# Patient Record
Sex: Male | Born: 1940
Health system: Southern US, Community
[De-identification: ages and names within clinical notes are randomized; demographics above are authoritative.]

## PROBLEM LIST (undated history)

## (undated) DIAGNOSIS — Z8719 Personal history of other diseases of the digestive system: Secondary | ICD-10-CM

## (undated) DIAGNOSIS — I2510 Atherosclerotic heart disease of native coronary artery without angina pectoris: Secondary | ICD-10-CM

## (undated) DIAGNOSIS — E7800 Pure hypercholesterolemia, unspecified: Secondary | ICD-10-CM

## (undated) DIAGNOSIS — K219 Gastro-esophageal reflux disease without esophagitis: Secondary | ICD-10-CM

## (undated) DIAGNOSIS — I10 Essential (primary) hypertension: Secondary | ICD-10-CM

## (undated) DIAGNOSIS — T7840XA Allergy, unspecified, initial encounter: Secondary | ICD-10-CM

## (undated) DIAGNOSIS — J449 Chronic obstructive pulmonary disease, unspecified: Secondary | ICD-10-CM

## (undated) DIAGNOSIS — M1990 Unspecified osteoarthritis, unspecified site: Secondary | ICD-10-CM

## (undated) HISTORY — PX: KNEE ARTHROSCOPY: SHX127

## (undated) HISTORY — PX: EYE SURGERY: SHX253

## (undated) HISTORY — DX: Allergy, unspecified, initial encounter: T78.40XA

## (undated) HISTORY — PX: OTHER SURGICAL HISTORY: SHX169

## (undated) HISTORY — PX: CARDIAC CATHETERIZATION: SHX172

## (undated) HISTORY — PX: CORONARY ANGIOPLASTY: SHX604

## (undated) HISTORY — DX: Chronic obstructive pulmonary disease, unspecified: J44.9

---

## 2004-08-08 ENCOUNTER — Emergency Department (HOSPITAL_COMMUNITY): Admission: EM | Admit: 2004-08-08 | Discharge: 2004-08-08 | Payer: Self-pay | Admitting: *Deleted

## 2007-06-25 ENCOUNTER — Ambulatory Visit: Payer: Self-pay | Admitting: Unknown Physician Specialty

## 2012-11-05 ENCOUNTER — Ambulatory Visit: Payer: Self-pay | Admitting: Cardiology

## 2012-11-06 LAB — BASIC METABOLIC PANEL
Anion Gap: 7 (ref 7–16)
BUN: 12 mg/dL (ref 7–18)
Calcium, Total: 8.3 mg/dL — ABNORMAL LOW (ref 8.5–10.1)
Co2: 26 mmol/L (ref 21–32)
Creatinine: 0.96 mg/dL (ref 0.60–1.30)
EGFR (African American): >60
EGFR (Non-African Amer.): >60
Glucose: 127 mg/dL — ABNORMAL HIGH (ref 65–99)
Potassium: 3.8 mmol/L (ref 3.5–5.1)
Sodium: 137 mmol/L (ref 136–145)

## 2012-11-06 LAB — CK TOTAL AND CKMB (NOT AT ARMC): CK-MB: 2.3 ng/mL (ref 0.5–3.6)

## 2013-10-21 DIAGNOSIS — E785 Hyperlipidemia, unspecified: Secondary | ICD-10-CM | POA: Insufficient documentation

## 2013-10-21 DIAGNOSIS — I10 Essential (primary) hypertension: Secondary | ICD-10-CM | POA: Insufficient documentation

## 2014-01-07 DIAGNOSIS — I2510 Atherosclerotic heart disease of native coronary artery without angina pectoris: Secondary | ICD-10-CM | POA: Insufficient documentation

## 2014-01-07 DIAGNOSIS — I25119 Atherosclerotic heart disease of native coronary artery with unspecified angina pectoris: Secondary | ICD-10-CM | POA: Insufficient documentation

## 2014-03-05 ENCOUNTER — Ambulatory Visit: Payer: Self-pay | Admitting: Unknown Physician Specialty

## 2014-03-08 LAB — PATHOLOGY REPORT

## 2015-01-27 ENCOUNTER — Ambulatory Visit (INDEPENDENT_AMBULATORY_CARE_PROVIDER_SITE_OTHER): Payer: PPO | Admitting: Family Medicine

## 2015-01-27 ENCOUNTER — Ambulatory Visit (INDEPENDENT_AMBULATORY_CARE_PROVIDER_SITE_OTHER)
Admission: RE | Admit: 2015-01-27 | Discharge: 2015-01-27 | Disposition: A | Discharge status: Home / Self Care | Payer: PPO | Source: Ambulatory Visit | Attending: Family Medicine | Admitting: Family Medicine

## 2015-01-27 ENCOUNTER — Encounter: Payer: Self-pay | Admitting: Family Medicine

## 2015-01-27 ENCOUNTER — Encounter: Payer: Self-pay | Admitting: *Deleted

## 2015-01-27 VITALS — BP 118/60 | HR 68 | Wt 165.0 lb

## 2015-01-27 DIAGNOSIS — M545 Low back pain, unspecified: Secondary | ICD-10-CM

## 2015-01-27 DIAGNOSIS — I2510 Atherosclerotic heart disease of native coronary artery without angina pectoris: Secondary | ICD-10-CM | POA: Insufficient documentation

## 2015-01-27 DIAGNOSIS — M5416 Radiculopathy, lumbar region: Secondary | ICD-10-CM | POA: Diagnosis not present

## 2015-01-27 DIAGNOSIS — K219 Gastro-esophageal reflux disease without esophagitis: Secondary | ICD-10-CM | POA: Insufficient documentation

## 2015-01-27 DIAGNOSIS — D126 Benign neoplasm of colon, unspecified: Secondary | ICD-10-CM | POA: Insufficient documentation

## 2015-01-27 DIAGNOSIS — T7840XA Allergy, unspecified, initial encounter: Secondary | ICD-10-CM | POA: Insufficient documentation

## 2015-01-27 MED ORDER — GABAPENTIN 100 MG PO CAPS: 100.0000 mg | ORAL_CAPSULE | Freq: Every day | ORAL | Status: DC

## 2015-01-27 NOTE — Assessment & Plan Note (Signed)
Patient is having signs and symptoms of lumbar radiculopathy. No significant weakness or numbness noted that seems to be consistent. Patient is actually improving over the course of time.  Discussed with patient and patient will do a short course of anti-inflammatory for the next 6 days, on gabapentin at night, home exercises and patient work with Product/process development scientist today to [place] greater detail. We discussed icing protocol and which activities to potentially avoid. Patient come back in 2-3 weeks or call sooner if any severe symptoms occur which we did go over with patient.

## 2015-01-27 NOTE — Progress Notes (Signed)
[name] [place], [place] [address] Phone: [phone] Subjective:    I'm seeing this patient by the request  of:  Baboff MD  CC: Back pain [id number] James Peck is a 74 y.o. male coming in with complaint of back pain for approximately 3 weeks. Patient does not remember any specific injury. States that he felt a twinge when he was in a shower. Next day he was unable to even moves significant. Patient actually had pain going down both legs but is slowly started to decrease but still seems to be localized over the left leg. States that is mostly on the posterior aspect the leg and has gone away down to his foot. Seems to be loosening up somewhat. States though in the morning he can be very painful. Worse with flexing at the hip. Denies any weakness, numbness that is consistent, or any bowel or bladder changes. Patient also denies any fevers or chills or any abnormal weight loss. States that the pain was severe enough to be 9 out of 10 but now is 3 out of 10 in severity. Patient states that he did try Advil with mild improvement.   Patient Active Problem List   Diagnosis Date Noted  . Acid reflux 01/27/2015  . Adenomatous colon polyp 01/27/2015  . Allergic state 01/27/2015  . Arteriosclerosis of coronary artery 01/27/2015  . CAD in native artery 01/07/2014  . Benign essential HTN 10/21/2013  . HLD (hyperlipidemia) 10/21/2013   History  Substance Use Topics  . Smoking status: Not on file  . Smokeless tobacco: Not on file  . Alcohol Use: Not on file     Cardiac stent    Colonoscopy 06/25/2007 Adenomatous Polyp  Colonoscopy 03/05/2014 Adenomatous Polyps: CBF 02/2017  Coronary Angioplasty  s/p pci of rca in 1999; pci of lad in 2014   Family History Medical History Relation Name Comments  Heart disease Mother    Diabetes type II Father    Heart disease         Social History Tobacco Use Types Packs/Day Years Used Date    Former Smoker    Quit: 07/30/1992   Alcohol Use Drinks/Week oz/Week Comments  no   Past medical history, social, surgical and family history all reviewed in electronic medical record.   Review of Systems: No headache, visual changes, nausea, vomiting, diarrhea, constipation, dizziness, abdominal pain, skin rash, fevers, chills, night sweats, weight loss, swollen lymph nodes, body aches, joint swelling, muscle aches, chest pain, shortness of breath, mood changes.   Objective Blood pressure 118/60, pulse 68, weight 165 lb (74.844 kg), SpO2 96 %.  General: No apparent distress alert and oriented x3 mood and affect normal, dressed appropriately.  HEENT: Pupils equal, extraocular movements intact  Respiratory: Patient's speak in full sentences and does not appear short of breath  Cardiovascular: No lower extremity edema, non tender, no erythema  Skin: Warm dry intact with no signs of infection or rash on extremities or on axial skeleton.  Abdomen: Soft nontender  Neuro: Cranial nerves II through XII are intact, neurovascularly intact in all extremities with 2+ DTRs and 2+ pulses.  Lymph: No lymphadenopathy of posterior or anterior cervical chain or axillae bilaterally.  Gait normal with good balance and coordination.  MSK:  Non tender with full range of motion and good stability and symmetric strength and tone of shoulders, elbows, wrist, hip, knee and ankles bilaterally. Moderate osteophytic changes of multiple joints Back Exam:  Inspection: Unremarkable  Motion: Flexion 25 deg with mild radicular symptoms down the left leg, Extension 25 deg, Side Bending to 35 deg bilaterally,  Rotation to 30 deg bilaterally  SLR laying: Positive left side XSLR laying: Negative  Palpable tenderness: Tender to palpation over the sacroiliac joint as well as the paraspinal musculature of L5-S1 on the left side FABER: Positive left side Sensory change: Gross sensation intact to all lumbar and sacral  dermatomes.  Reflexes: 2+ at both patellar tendons, 2+ at achilles tendons, Babinski's downgoing.  Strength at foot  Plantar-flexion: 5/5 Dorsi-flexion: 5/5 Eversion: 5/5 Inversion: 5/5  Leg strength  Quad: 5/5 Hamstring: 5/5 Hip flexor: 5/5 Hip abductors: 4/5 but symmetric Gait unremarkable.     Impression and Recommendations:     This case required medical decision making of moderate complexity.

## 2015-01-27 NOTE — Progress Notes (Signed)
Pre visit review using our clinic review tool, if applicable. No additional management support is needed unless otherwise documented below in the visit note. 

## 2015-01-27 NOTE — Patient Instructions (Signed)
Good to see you.  Ice 20 minutes 2 times daily. Usually after activity and before bed. Exercises 3 times a week.  Get xray downstairs today on your back Try the Duexis for 3-6days (1pill 3x/day)  Use the Gabapentin nightly 100mg  to help with the nerve pain Change sitting position ain the car with a towel under knee.  See me again in 2-3 weeks.

## 2015-02-14 ENCOUNTER — Ambulatory Visit (INDEPENDENT_AMBULATORY_CARE_PROVIDER_SITE_OTHER): Payer: PPO | Admitting: Family Medicine

## 2015-02-14 ENCOUNTER — Other Ambulatory Visit (INDEPENDENT_AMBULATORY_CARE_PROVIDER_SITE_OTHER): Payer: PPO

## 2015-02-14 ENCOUNTER — Encounter: Payer: Self-pay | Admitting: Family Medicine

## 2015-02-14 VITALS — BP 118/78 | HR 72 | Wt 163.0 lb

## 2015-02-14 DIAGNOSIS — M25561 Pain in right knee: Secondary | ICD-10-CM | POA: Diagnosis not present

## 2015-02-14 DIAGNOSIS — M23207 Derangement of unspecified meniscus due to old tear or injury, left knee: Secondary | ICD-10-CM

## 2015-02-14 DIAGNOSIS — M5416 Radiculopathy, lumbar region: Secondary | ICD-10-CM

## 2015-02-14 DIAGNOSIS — M23307 Other meniscus derangements, unspecified meniscus, left knee: Secondary | ICD-10-CM | POA: Insufficient documentation

## 2015-02-14 NOTE — Assessment & Plan Note (Signed)
Significantly improve with conservative therapy. Patient encouraged to continue the over-the-counter natural supplements and the home exercises. Patient work on more core strengthening as well as well. Patient will come back and see me again if any worsening symptoms. I would like to follow-up though one more time in 4-6 weeks to make sure patient is nearly pain-free.

## 2015-02-14 NOTE — Patient Instructions (Signed)
Good to see you.  Ice 20 minutes 2 times daily. Usually after activity and before bed. Exercises 3 times a week.  Degenerative tear of the meniscus.  Turmeric 500mg  twice daily Wear the knee brace with a lot of activity.  See me again in 3-4 weeks for knee and injection if not a lot better.

## 2015-02-14 NOTE — Progress Notes (Signed)
[facility] [place], [place] [address] Phone: [phone] Subjective:    CC: Back pain  James Peck is a 75 y.o. male coming in with complaint of back pain for approximately 3 weeks. She was found to have some mild exacerbation of back pain. This seemed to be more musculoskeletal in nature. Patient did have x-rays after last exam and showed some mild arthritis. Patient's was to do home exercises, icing protocol, and was started on gabapentin at night for the radicular symptoms. Patient states his back pain is 75% better. Patient states that it is very mildly dull ache. Patient states no significant radicular symptoms. Patient is feeling better with the exercises, icing and the natural supplementations.  Patient has a new problem no. Patient is having more of a left knee pain. States that it seems to be on the anterior medial aspect of the knee. Starts to give him a catching sensation that can be severely painful. Patient states certain range of motion can hurt. Denies any radiation down the legs any numbness or any significant instability. States that the soreness at the end of a long day has been difficult to follow sleep from.   Patient Active Problem List   Diagnosis Date Noted  . Acid reflux 01/27/2015  . Adenomatous colon polyp 01/27/2015  . Allergic state 01/27/2015  . Arteriosclerosis of coronary artery 01/27/2015  . Lumbar radiculopathy 01/27/2015  . CAD in native artery 01/07/2014  . Benign essential HTN 10/21/2013  . HLD (hyperlipidemia) 10/21/2013   History  Substance Use Topics  . Smoking status: Former Research scientist (life sciences)  . Smokeless tobacco: Never Used  . Alcohol Use: No     Cardiac stent    Colonoscopy 06/25/2007 Adenomatous Polyp  Colonoscopy 03/05/2014 Adenomatous Polyps: CBF 02/2017  Coronary Angioplasty  s/p pci of rca in 1999; pci of lad in 2014   Family History Medical History Relation Name Comments  Heart  disease Mother    Diabetes type II Father    Heart disease         Social History Tobacco Use Types Packs/Day Years Used Date  Former Smoker    Quit: 07/30/1992   Alcohol Use Drinks/Week oz/Week Comments  no   Past medical history, social, surgical and family history all reviewed in electronic medical record.   Review of Systems: No headache, visual changes, nausea, vomiting, diarrhea, constipation, dizziness, abdominal pain, skin rash, fevers, chills, night sweats, weight loss, swollen lymph nodes, body aches, joint swelling, muscle aches, chest pain, shortness of breath, mood changes.   Objective Blood pressure 118/78, pulse 72, weight 163 lb (73.936 kg), SpO2 98 %.  General: No apparent distress alert and oriented x3 mood and affect normal, dressed appropriately.  HEENT: Pupils equal, extraocular movements intact  Respiratory: Patient's speak in full sentences and does not appear short of breath  Cardiovascular: No lower extremity edema, non tender, no erythema  Skin: Warm dry intact with no signs of infection or rash on extremities or on axial skeleton.  Abdomen: Soft nontender  Neuro: Cranial nerves II through XII are intact, neurovascularly intact in all extremities with 2+ DTRs and 2+ pulses.  Lymph: No lymphadenopathy of posterior or anterior cervical chain or axillae bilaterally.  Gait normal with good balance and coordination.  MSK:  Non tender with full range of motion and good stability and symmetric strength and tone of shoulders, elbows, wrist, hip, knee and ankles bilaterally. Moderate osteophytic changes of multiple joints Back  Exam:  Inspection: Unremarkable  Motion: Flexion 35 deg improved Extension 25 deg, Side Bending to 35 deg bilaterally,  Rotation to 30 deg bilaterally  SLR laying: Negative today which is an improvement XSLR laying: Negative  Palpable tenderness: Mild tenderness over the sacroiliac joint FABER: Negative Sensory change: Gross  sensation intact to all lumbar and sacral dermatomes.  Reflexes: 2+ at both patellar tendons, 2+ at achilles tendons, Babinski's downgoing.  Strength at foot  Plantar-flexion: 5/5 Dorsi-flexion: 5/5 Eversion: 5/5 Inversion: 5/5  Leg strength  Quad: 5/5 Hamstring: 5/5 Hip flexor: 5/5 Hip abductors: 4/5 but symmetric Gait unremarkable.  Knee: Left Normal to inspection with no erythema or effusion or obvious bony abnormalities. Mild tenderness over the medial joint line ROM full in flexion and extension and lower leg rotation. Ligaments with solid consistent endpoints including ACL, PCL, LCL, MCL. Positive Mcmurray's, Apley's, and Thessalonian tests. Non painful patellar compression. Patellar glide without crepitus. Patellar and quadriceps tendons unremarkable. Hamstring and quadriceps strength is normal.  Contralateral knee unremarkable  MSK US performed of: Left knee This study was ordered, performed, and interpreted by Charlann Boxer D.O.  Knee: All structures visualized. Mild narrowing of the medial joint line with a degenerative meniscal tear with mild displacement. Patellar Tendon unremarkable on long and transverse views without effusion. No abnormality of prepatellar bursa. LCL and MCL unremarkable on long and transverse views. No abnormality of origin of medial or lateral head of the gastrocnemius.  IMPRESSION:  Mild arthritis an medial meniscal tear degenerative     Impression and Recommendations:     This case required medical decision making of moderate complexity.

## 2015-02-14 NOTE — Assessment & Plan Note (Signed)
She does have a tear involving try topical anti-inflammatory's, we discussed icing regimen. Patient declined injection today. Patient will try to make these changes and come back and see me again in 3-4 weeks if having worsening symptoms we'll consider injection and physical therapy.

## 2015-03-09 ENCOUNTER — Encounter: Payer: Self-pay | Admitting: Family Medicine

## 2015-03-09 ENCOUNTER — Ambulatory Visit (INDEPENDENT_AMBULATORY_CARE_PROVIDER_SITE_OTHER): Payer: PPO | Admitting: Family Medicine

## 2015-03-09 VITALS — BP 120/70 | HR 81 | Wt 165.0 lb

## 2015-03-09 DIAGNOSIS — M23307 Other meniscus derangements, unspecified meniscus, left knee: Secondary | ICD-10-CM

## 2015-03-09 DIAGNOSIS — M25562 Pain in left knee: Secondary | ICD-10-CM

## 2015-03-09 DIAGNOSIS — M23207 Derangement of unspecified meniscus due to old tear or injury, left knee: Secondary | ICD-10-CM

## 2015-03-09 NOTE — Progress Notes (Signed)
[name] [place], [place] [address] Phone: [phone] Subjective:    CC: Back pain knee pain follow-up James Peck is a 74 y.o. male coming in with complaint of back pain.  Found to have mild to moderate osteophytic changes of the back. Patient has done very well with conservative therapy. Patient states that the back pain is doing very well.  Patient is also having knee pain. Patient was found to have degenerative meniscal tear of the left knee. Patient elected try conservative therapy including home exercises icing protocol topical medications. Patient states continues to have difficulty. Patient states that unfortunately if he is now wearing the brace he has even difficult he even walking. Patient states that it is sore at night. Describes it as a throbbing pain. Denies any radiation. Denies any giving out on him.   Patient Active Problem List   Diagnosis Date Noted  . 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 Acid reflux 01/27/2015  . Adenomatous colon polyp 01/27/2015  . Allergic state 01/27/2015  . Arteriosclerosis of coronary artery 01/27/2015  . Lumbar radiculopathy 01/27/2015  . CAD in native artery 01/07/2014  . Benign essential HTN 10/21/2013  . HLD (hyperlipidemia) 10/21/2013   Social History  Substance Use Topics  . Smoking status: Former Research scientist (life sciences)  . Smokeless tobacco: Never Used  . Alcohol Use: No     Cardiac stent    Colonoscopy 06/25/2007 Adenomatous Polyp  Colonoscopy 03/05/2014 Adenomatous Polyps: CBF 02/2017  Coronary Angioplasty  s/p pci of rca in 1999; pci of lad in 2014   Family History Medical History Relation Name Comments  Heart disease Mother    Diabetes type II Father    Heart disease         Social History Tobacco Use Types Packs/Day Years Used Date  Former Smoker    Quit: 07/30/1992   Alcohol Use Drinks/Week oz/Week Comments  no   Past medical history,  social, surgical and family history all reviewed in electronic medical record.   Review of Systems: No headache, visual changes, nausea, vomiting, diarrhea, constipation, dizziness, abdominal pain, skin rash, fevers, chills, night sweats, weight loss, swollen lymph nodes, body aches, joint swelling, muscle aches, chest pain, shortness of breath, mood changes.   Objective Blood pressure 120/70, pulse 81, weight 165 lb (74.844 kg), SpO2 95 %.  General: No apparent distress alert and oriented x3 mood and affect normal, dressed appropriately.  HEENT: Pupils equal, extraocular movements intact  Respiratory: Patient's speak in full sentences and does not appear short of breath  Cardiovascular: No lower extremity edema, non tender, no erythema  Skin: Warm dry intact with no signs of infection or rash on extremities or on axial skeleton.  Abdomen: Soft nontender  Neuro: Cranial nerves II through XII are intact, neurovascularly intact in all extremities with 2+ DTRs and 2+ pulses.  Lymph: No lymphadenopathy of posterior or anterior cervical chain or axillae bilaterally.  Gait normal with good balance and coordination.  MSK:  Non tender with full range of motion and good stability and symmetric strength and tone of shoulders, elbows, wrist, hip, knee and ankles bilaterally. Moderate osteophytic changes of multiple joints  Knee: Left Normal to inspection with no erythema or effusion or obvious bony abnormalities. Moderate tenderness over the medial joint line ROM full in flexion and extension and lower leg rotation. Ligaments with solid consistent endpoints including ACL, PCL, LCL, MCL. Positive Mcmurray's, Apley's, and Thessalonian tests. Non  painful patellar compression. Patellar glide without crepitus. Patellar and quadriceps tendons unremarkable. Hamstring and quadriceps strength is normal.  Contralateral knee unremarkable No change from previous exam  Procedure After informed written and  verbal consent, patient was seated on exam table. Left knee was prepped with alcohol swab and utilizing anterolateral approach, patient's left knee space was injected with 4:1  marcaine 0.5%: Kenalog 40mg /dL. Patient tolerated the procedure well without immediate complications.     Impression and Recommendations:     This case required medical decision making of moderate complexity.

## 2015-03-09 NOTE — Assessment & Plan Note (Signed)
Patient was given an injection today. We discussed icing regimen and home exercises. Patient will continue with the brace as well. Any worsening of symptoms of any consider advanced imaging. Patient has been referred to formal physical therapy. Patient come back in 4 weeks.

## 2015-03-09 NOTE — Patient Instructions (Signed)
Good to see you Continue the brace with a lot of activity Gave injection today and ice in 6 hours Physical therapy will be calling you See me again in 3-4 weeks.

## 2015-03-14 ENCOUNTER — Ambulatory Visit: Payer: PPO | Admitting: Family Medicine

## 2015-03-16 ENCOUNTER — Telehealth: Payer: Self-pay | Admitting: Family Medicine

## 2015-03-16 NOTE — Telephone Encounter (Signed)
Referral re-faxed to [facility] on [address]

## 2015-03-16 NOTE — Telephone Encounter (Signed)
Patient wife stating [facility] told her today that they have not received the fax for referral. She is requesting that we send it again

## 2015-03-16 NOTE — Telephone Encounter (Signed)
See referral notes for further information regarding this referral.

## 2015-03-30 ENCOUNTER — Ambulatory Visit (INDEPENDENT_AMBULATORY_CARE_PROVIDER_SITE_OTHER): Payer: PPO | Admitting: Family Medicine

## 2015-03-30 ENCOUNTER — Encounter: Payer: Self-pay | Admitting: Family Medicine

## 2015-03-30 VITALS — BP 132/78 | HR 76 | Wt 163.0 lb

## 2015-03-30 DIAGNOSIS — M23307 Other meniscus derangements, unspecified meniscus, left knee: Secondary | ICD-10-CM

## 2015-03-30 DIAGNOSIS — M23207 Derangement of unspecified meniscus due to old tear or injury, left knee: Secondary | ICD-10-CM

## 2015-03-30 NOTE — Assessment & Plan Note (Signed)
Patient is doing significantly better. Encourage patient to do more of icing protocol. Patient will finish with formal physical therapy. Patient come back and see me again in 6 weeks if not perfect.

## 2015-03-30 NOTE — Progress Notes (Signed)
[name] [place], [place] [address] Phone: [phone] Subjective:    CC: Back pain knee pain follow-up [id number] James Peck is a 74 y.o. male coming in with complaint of back pain.  Found to have mild to moderate osteophytic changes of the back. Patient has done very well with conservative therapy. Patient states that the back pain is doing very well. Patient was found to have a meniscal injury. Patient states that he is doing 100% better. Not have anything is locking on him. States that he is doing very good with physical therapy. Denies any new symptoms. Patient is very happy.     Patient Active Problem List   Diagnosis Date Noted  . 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 Acid reflux 01/27/2015  . Adenomatous colon polyp 01/27/2015  . Allergic state 01/27/2015  . Arteriosclerosis of coronary artery 01/27/2015  . Lumbar radiculopathy 01/27/2015  . CAD in native artery 01/07/2014  . Benign essential HTN 10/21/2013  . HLD (hyperlipidemia) 10/21/2013   Social History  Substance Use Topics  . Smoking status: Former Research scientist (life sciences)  . Smokeless tobacco: Never Used  . Alcohol Use: No     Cardiac stent    Colonoscopy 06/25/2007 Adenomatous Polyp  Colonoscopy 03/05/2014 Adenomatous Polyps: CBF 02/2017  Coronary Angioplasty  s/p pci of rca in 1999; pci of lad in 2014   Family History Medical History Relation Name Comments  Heart disease Mother    Diabetes type II Father    Heart disease         Social History Tobacco Use Types Packs/Day Years Used Date  Former Smoker    Quit: 07/30/1992   Alcohol Use Drinks/Week oz/Week Comments  no   Past medical history, social, surgical and family history all reviewed in electronic medical record.   Review of Systems: No headache, visual changes, nausea, vomiting, diarrhea, constipation, dizziness, abdominal pain, skin rash, fevers, chills, night sweats, weight  loss, swollen lymph nodes, body aches, joint swelling, muscle aches, chest pain, shortness of breath, mood changes.   Objective Blood pressure 132/78, pulse 76, weight 163 lb (73.936 kg).  General: No apparent distress alert and oriented x3 mood and affect normal, dressed appropriately.  HEENT: Pupils equal, extraocular movements intact  Respiratory: Patient's speak in full sentences and does not appear short of breath  Cardiovascular: No lower extremity edema, non tender, no erythema  Skin: Warm dry intact with no signs of infection or rash on extremities or on axial skeleton.  Abdomen: Soft nontender  Neuro: Cranial nerves II through XII are intact, neurovascularly intact in all extremities with 2+ DTRs and 2+ pulses.  Lymph: No lymphadenopathy of posterior or anterior cervical chain or axillae bilaterally.  Gait normal with good balance and coordination.  MSK:  Non tender with full range of motion and good stability and symmetric strength and tone of shoulders, elbows, wrist, hip, knee and ankles bilaterally. Moderate osteophytic changes of multiple joints  Knee: Left Normal to inspection with no erythema or effusion or obvious bony abnormalities. Moderate tenderness over the medial joint line ROM full in flexion and extension and lower leg rotation. Ligaments with solid consistent endpoints including ACL, PCL, LCL, MCL. Negative McMurray's Non painful patellar compression. Patellar glide without crepitus. Patellar and quadriceps tendons unremarkable. Hamstring and quadriceps strength is normal.  Contralateral knee unremarkable No change from previous exam       Impression and Recommendations:     This case  required medical decision making of moderate complexity.

## 2015-03-30 NOTE — Patient Instructions (Signed)
Dont rock the boat Thanks for working your tail off [facility] is your friend Stay active and go to PT as long as you feel you get a benefit See me in 6 weeks if not perfect

## 2015-04-18 ENCOUNTER — Encounter: Payer: Self-pay | Admitting: Family Medicine

## 2015-04-18 ENCOUNTER — Ambulatory Visit (INDEPENDENT_AMBULATORY_CARE_PROVIDER_SITE_OTHER): Payer: PPO | Admitting: Family Medicine

## 2015-04-18 ENCOUNTER — Ambulatory Visit (INDEPENDENT_AMBULATORY_CARE_PROVIDER_SITE_OTHER)
Admission: RE | Admit: 2015-04-18 | Discharge: 2015-04-18 | Disposition: A | Discharge status: Home / Self Care | Payer: PPO | Source: Ambulatory Visit | Attending: Family Medicine | Admitting: Family Medicine

## 2015-04-18 VITALS — BP 128/64 | HR 79 | Ht 65.0 in | Wt 165.0 lb

## 2015-04-18 DIAGNOSIS — M23207 Derangement of unspecified meniscus due to old tear or injury, left knee: Secondary | ICD-10-CM

## 2015-04-18 DIAGNOSIS — M25562 Pain in left knee: Secondary | ICD-10-CM

## 2015-04-18 DIAGNOSIS — M23307 Other meniscus derangements, unspecified meniscus, left knee: Secondary | ICD-10-CM

## 2015-04-18 NOTE — Patient Instructions (Signed)
Good to see you James Peck is good  Take 72 hours off from exercises, stop PT until I know. What MRI Xrays downstairs.  Once MRI is scheduled see me 1-2 days after MRI.

## 2015-04-18 NOTE — Progress Notes (Signed)
[name] [place], [place] [address] Phone: [phone] Subjective:    CC: Back pain knee pain follow-up [id number] James Peck is a 74 y.o. male coming in with complaint of back pain.  Found to have mild to moderate osteophytic changes of the back. Patient had been doing very well with conservative therapy including osteopathic manipulation. Patient states that his back is seems to be doing relatively well.  Patient though unfortunately was found to also have degenerative tear of his meniscus. Patient had been doing very well with conservative therapy. Patient states over the course of the last 5 days he has had more swelling, pain, and states that the knee feels significantly unstable. Patient was given an injection in this knee 6 weeks ago and was doing very well. Patient feels that his knee may be getting worse over the course last 4 days. Rates the severity of pain is 8 out of 10. May be as bad as the first initial visit.     Patient Active Problem List   Diagnosis Date Noted  . 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 Acid reflux 01/27/2015  . Adenomatous colon polyp 01/27/2015  . Allergic state 01/27/2015  . Arteriosclerosis of coronary artery 01/27/2015  . Lumbar radiculopathy 01/27/2015  . CAD in native artery 01/07/2014  . Benign essential HTN 10/21/2013  . HLD (hyperlipidemia) 10/21/2013   Social History  Substance Use Topics  . Smoking status: Former Research scientist (life sciences)  . Smokeless tobacco: Never Used  . Alcohol Use: No     Cardiac stent    Colonoscopy 06/25/2007 Adenomatous Polyp  Colonoscopy 03/05/2014 Adenomatous Polyps: CBF 02/2017  Coronary Angioplasty  s/p pci of rca in 1999; pci of lad in 2014   Family History Medical History Relation Name Comments  Heart disease Mother    Diabetes type II Father    Heart disease         Social History Tobacco Use Types Packs/Day Years Used Date  Former  Smoker    Quit: 07/30/1992   Alcohol Use Drinks/Week oz/Week Comments  no   Past medical history, social, surgical and family history all reviewed in electronic medical record.   Review of Systems: No headache, visual changes, nausea, vomiting, diarrhea, constipation, dizziness, abdominal pain, skin rash, fevers, chills, night sweats, weight loss, swollen lymph nodes, body aches, joint swelling, muscle aches, chest pain, shortness of breath, mood changes.   Objective Blood pressure 128/64, pulse 79, height 5\' 5"  (1.651 m), weight 165 lb (74.844 kg), SpO2 97 %.  General: No apparent distress alert and oriented x3 mood and affect normal, dressed appropriately.  HEENT: Pupils equal, extraocular movements intact  Respiratory: Patient's speak in full sentences and does not appear short of breath  Cardiovascular: No lower extremity edema, non tender, no erythema  Skin: Warm dry intact with no signs of infection or rash on extremities or on axial skeleton.  Abdomen: Soft nontender  Neuro: Cranial nerves II through XII are intact, neurovascularly intact in all extremities with 2+ DTRs and 2+ pulses.  Lymph: No lymphadenopathy of posterior or anterior cervical chain or axillae bilaterally.  Gait normal with good balance and coordination.  MSK:  Non tender with full range of motion and good stability and symmetric strength and tone of shoulders, elbows, wrist, hip, knee and ankles bilaterally. Moderate osteophytic changes of multiple joints  Knee: Left Normal to inspection with no erythema or effusion or obvious bony abnormalities. Severely tenderness  over the medial joint line ROM full in flexion and extension and lower leg rotation. Ligaments with solid consistent endpoints including ACL, PCL, LCL, MCL. Some mild instability with valgus stress Positive McMurray's and Thessaly's Non painful patellar compression. Patellar glide without crepitus. Patellar and quadriceps tendons  unremarkable. Hamstring and quadriceps strength is normal.  Contralateral knee unremarkable No change from previous exam       Impression and Recommendations:     This case required medical decision making of moderate complexity.

## 2015-04-18 NOTE — Progress Notes (Signed)
Pre visit review using our clinic review tool, if applicable. No additional management support is needed unless otherwise documented below in the visit note. 

## 2015-04-18 NOTE — Assessment & Plan Note (Signed)
I believe the patient does have acute on chronic meniscal tear is severely worsening pain on the medial joint line. Underlying arthritis is also contribute in. Patient will get x-rays as well as have an MRI for further evaluation. Patient is having instability and this is affecting his daily activities. Patient will continue to wear the medial unloader brace. Depending on findings on the MRI we'll discuss with the next step. We discussed the possibility of a meniscectomy versus the possibility of knee replacement depending on the amount of arthritis. Patient will come back 1-2 days after the MRI and we'll discuss further detail.  Spent  25 minutes with patient face-to-face and had greater than 50% of counseling including as described above in assessment and plan.

## 2015-04-20 ENCOUNTER — Telehealth: Payer: Self-pay | Admitting: Family Medicine

## 2015-04-20 NOTE — Telephone Encounter (Signed)
Pt call stated that Dr. Tamala Julian told him to f/u 1-2 days after the MRI. The MRI scheduled for 04/21/15 and Dr.Smith does not have anything open until next Tuesday. Please help

## 2015-04-21 ENCOUNTER — Telehealth: Payer: Self-pay

## 2015-04-21 ENCOUNTER — Ambulatory Visit
Admission: RE | Admit: 2015-04-21 | Discharge: 2015-04-21 | Disposition: A | Discharge status: Home / Self Care | Payer: PPO | Source: Ambulatory Visit | Attending: Family Medicine | Admitting: Family Medicine

## 2015-04-21 ENCOUNTER — Other Ambulatory Visit: Payer: Self-pay

## 2015-04-21 DIAGNOSIS — M25562 Pain in left knee: Secondary | ICD-10-CM

## 2015-04-21 NOTE — Telephone Encounter (Signed)
Spoke with patient about MRI and referral to see Dr. Marylene Buerger has contacted him already to schedule.

## 2015-04-25 ENCOUNTER — Telehealth: Payer: Self-pay | Admitting: Family Medicine

## 2015-04-25 NOTE — Telephone Encounter (Signed)
Pt has been referred to Dr. Mayer Camel and he doesn't see him until 10/11. He is in pain and you had given him som Duexis and he is out and wondering if he can get some more. However, those did bother with his stomach a little.  He can be reached at [phone]

## 2015-04-25 NOTE — Telephone Encounter (Signed)
Spoke to pt, left samples at front desk.

## 2015-04-26 ENCOUNTER — Other Ambulatory Visit: Payer: Self-pay | Admitting: *Deleted

## 2015-04-26 MED ORDER — TRAMADOL HCL 50 MG PO TABS: ORAL_TABLET | ORAL | Status: DC

## 2015-04-26 NOTE — Telephone Encounter (Signed)
rx for tramadol sent into rx.

## 2015-04-27 ENCOUNTER — Telehealth: Payer: Self-pay | Admitting: Family Medicine

## 2015-04-27 NOTE — Telephone Encounter (Signed)
Patient was prescribed traMADol (ULTRAM) 50 MG tablet [[id number] and he had a hard time sleeping last night and he is wondering if he can take the gabapentin (NEURONTIN) 100 MG capsule [[id number]] at night along with the tramadol. Please call pt

## 2015-04-27 NOTE — Telephone Encounter (Signed)
Yes he can take tramadol with gabapentin. Instructions given to pt's wife.

## 2015-04-29 ENCOUNTER — Other Ambulatory Visit: Payer: PPO

## 2015-05-11 ENCOUNTER — Ambulatory Visit: Payer: PPO | Admitting: Family Medicine

## 2015-11-21 DIAGNOSIS — Z79899 Other long term (current) drug therapy: Secondary | ICD-10-CM | POA: Diagnosis not present

## 2015-11-21 DIAGNOSIS — E7800 Pure hypercholesterolemia, unspecified: Secondary | ICD-10-CM | POA: Diagnosis not present

## 2015-11-28 ENCOUNTER — Other Ambulatory Visit: Payer: Self-pay | Admitting: Family Medicine

## 2015-11-28 DIAGNOSIS — I2510 Atherosclerotic heart disease of native coronary artery without angina pectoris: Secondary | ICD-10-CM | POA: Diagnosis not present

## 2015-11-28 DIAGNOSIS — I10 Essential (primary) hypertension: Secondary | ICD-10-CM | POA: Diagnosis not present

## 2015-11-28 DIAGNOSIS — R7301 Impaired fasting glucose: Secondary | ICD-10-CM | POA: Diagnosis not present

## 2015-11-28 DIAGNOSIS — K219 Gastro-esophageal reflux disease without esophagitis: Secondary | ICD-10-CM | POA: Diagnosis not present

## 2015-11-28 DIAGNOSIS — R748 Abnormal levels of other serum enzymes: Secondary | ICD-10-CM | POA: Diagnosis not present

## 2015-11-28 DIAGNOSIS — E7800 Pure hypercholesterolemia, unspecified: Secondary | ICD-10-CM | POA: Diagnosis not present

## 2015-12-01 ENCOUNTER — Ambulatory Visit
Admission: RE | Admit: 2015-12-01 | Discharge: 2015-12-01 | Disposition: A | Discharge status: Home / Self Care | Payer: PPO | Source: Ambulatory Visit | Attending: Family Medicine | Admitting: Family Medicine

## 2015-12-01 DIAGNOSIS — R932 Abnormal findings on diagnostic imaging of liver and biliary tract: Secondary | ICD-10-CM | POA: Insufficient documentation

## 2015-12-01 DIAGNOSIS — R748 Abnormal levels of other serum enzymes: Secondary | ICD-10-CM | POA: Diagnosis not present

## 2016-01-24 DIAGNOSIS — E782 Mixed hyperlipidemia: Secondary | ICD-10-CM | POA: Diagnosis not present

## 2016-01-24 DIAGNOSIS — I10 Essential (primary) hypertension: Secondary | ICD-10-CM | POA: Diagnosis not present

## 2016-01-24 DIAGNOSIS — I2510 Atherosclerotic heart disease of native coronary artery without angina pectoris: Secondary | ICD-10-CM | POA: Diagnosis not present

## 2016-01-24 DIAGNOSIS — I25119 Atherosclerotic heart disease of native coronary artery with unspecified angina pectoris: Secondary | ICD-10-CM | POA: Diagnosis not present

## 2016-02-21 DIAGNOSIS — R7301 Impaired fasting glucose: Secondary | ICD-10-CM | POA: Diagnosis not present

## 2016-02-21 DIAGNOSIS — R748 Abnormal levels of other serum enzymes: Secondary | ICD-10-CM | POA: Diagnosis not present

## 2016-02-28 ENCOUNTER — Other Ambulatory Visit: Payer: Self-pay | Admitting: Family Medicine

## 2016-02-28 DIAGNOSIS — R748 Abnormal levels of other serum enzymes: Secondary | ICD-10-CM | POA: Diagnosis not present

## 2016-02-28 DIAGNOSIS — R739 Hyperglycemia, unspecified: Secondary | ICD-10-CM | POA: Diagnosis not present

## 2016-02-28 DIAGNOSIS — Z79899 Other long term (current) drug therapy: Secondary | ICD-10-CM | POA: Diagnosis not present

## 2016-02-28 DIAGNOSIS — E7800 Pure hypercholesterolemia, unspecified: Secondary | ICD-10-CM | POA: Diagnosis not present

## 2016-03-02 ENCOUNTER — Ambulatory Visit
Admission: RE | Admit: 2016-03-02 | Discharge: 2016-03-02 | Disposition: A | Discharge status: Home / Self Care | Payer: PPO | Source: Ambulatory Visit | Attending: Family Medicine | Admitting: Family Medicine

## 2016-03-02 DIAGNOSIS — K760 Fatty (change of) liver, not elsewhere classified: Secondary | ICD-10-CM | POA: Insufficient documentation

## 2016-03-02 DIAGNOSIS — R748 Abnormal levels of other serum enzymes: Secondary | ICD-10-CM

## 2016-06-20 DIAGNOSIS — R739 Hyperglycemia, unspecified: Secondary | ICD-10-CM | POA: Diagnosis not present

## 2016-06-20 DIAGNOSIS — Z79899 Other long term (current) drug therapy: Secondary | ICD-10-CM | POA: Diagnosis not present

## 2016-06-20 DIAGNOSIS — E7800 Pure hypercholesterolemia, unspecified: Secondary | ICD-10-CM | POA: Diagnosis not present

## 2016-06-28 DIAGNOSIS — Z0000 Encounter for general adult medical examination without abnormal findings: Secondary | ICD-10-CM | POA: Diagnosis not present

## 2016-06-28 DIAGNOSIS — Z125 Encounter for screening for malignant neoplasm of prostate: Secondary | ICD-10-CM | POA: Diagnosis not present

## 2016-06-28 DIAGNOSIS — R739 Hyperglycemia, unspecified: Secondary | ICD-10-CM | POA: Diagnosis not present

## 2016-07-20 DIAGNOSIS — H40039 Anatomical narrow angle, unspecified eye: Secondary | ICD-10-CM | POA: Diagnosis not present

## 2016-07-25 DIAGNOSIS — I2510 Atherosclerotic heart disease of native coronary artery without angina pectoris: Secondary | ICD-10-CM | POA: Diagnosis not present

## 2016-07-25 DIAGNOSIS — I25119 Atherosclerotic heart disease of native coronary artery with unspecified angina pectoris: Secondary | ICD-10-CM | POA: Diagnosis not present

## 2016-07-25 DIAGNOSIS — E782 Mixed hyperlipidemia: Secondary | ICD-10-CM | POA: Diagnosis not present

## 2016-07-25 DIAGNOSIS — I10 Essential (primary) hypertension: Secondary | ICD-10-CM | POA: Diagnosis not present

## 2016-07-26 DIAGNOSIS — H40039 Anatomical narrow angle, unspecified eye: Secondary | ICD-10-CM | POA: Diagnosis not present

## 2016-08-02 DIAGNOSIS — H2513 Age-related nuclear cataract, bilateral: Secondary | ICD-10-CM | POA: Diagnosis not present

## 2016-12-25 DIAGNOSIS — R739 Hyperglycemia, unspecified: Secondary | ICD-10-CM | POA: Diagnosis not present

## 2016-12-25 DIAGNOSIS — E7800 Pure hypercholesterolemia, unspecified: Secondary | ICD-10-CM | POA: Diagnosis not present

## 2016-12-25 DIAGNOSIS — Z125 Encounter for screening for malignant neoplasm of prostate: Secondary | ICD-10-CM | POA: Diagnosis not present

## 2016-12-25 DIAGNOSIS — Z79899 Other long term (current) drug therapy: Secondary | ICD-10-CM | POA: Diagnosis not present

## 2016-12-28 DIAGNOSIS — I2510 Atherosclerotic heart disease of native coronary artery without angina pectoris: Secondary | ICD-10-CM | POA: Diagnosis not present

## 2016-12-28 DIAGNOSIS — E7800 Pure hypercholesterolemia, unspecified: Secondary | ICD-10-CM | POA: Diagnosis not present

## 2016-12-28 DIAGNOSIS — R05 Cough: Secondary | ICD-10-CM | POA: Diagnosis not present

## 2016-12-28 DIAGNOSIS — R739 Hyperglycemia, unspecified: Secondary | ICD-10-CM | POA: Diagnosis not present

## 2016-12-28 DIAGNOSIS — I10 Essential (primary) hypertension: Secondary | ICD-10-CM | POA: Diagnosis not present

## 2016-12-28 DIAGNOSIS — Z79899 Other long term (current) drug therapy: Secondary | ICD-10-CM | POA: Diagnosis not present

## 2017-02-04 DIAGNOSIS — I25119 Atherosclerotic heart disease of native coronary artery with unspecified angina pectoris: Secondary | ICD-10-CM | POA: Diagnosis not present

## 2017-02-04 DIAGNOSIS — I10 Essential (primary) hypertension: Secondary | ICD-10-CM | POA: Diagnosis not present

## 2017-02-04 DIAGNOSIS — E782 Mixed hyperlipidemia: Secondary | ICD-10-CM | POA: Diagnosis not present

## 2017-02-04 DIAGNOSIS — I2510 Atherosclerotic heart disease of native coronary artery without angina pectoris: Secondary | ICD-10-CM | POA: Diagnosis not present

## 2017-02-13 DIAGNOSIS — I2510 Atherosclerotic heart disease of native coronary artery without angina pectoris: Secondary | ICD-10-CM | POA: Diagnosis not present

## 2017-04-19 DIAGNOSIS — D126 Benign neoplasm of colon, unspecified: Secondary | ICD-10-CM | POA: Diagnosis not present

## 2017-04-19 DIAGNOSIS — K219 Gastro-esophageal reflux disease without esophagitis: Secondary | ICD-10-CM | POA: Diagnosis not present

## 2017-06-03 NOTE — Progress Notes (Signed)
[facility] [place], [place] [address] Phone: [phone] Subjective:    I'm seeing this patient by the request  of:    CC: Left knee pain  [id number]  James Peck is a 76 y.o. male coming in with complaint of left knee pain. He has been having pain for the past 2 weeks. He said that his pain is the same problem that we have seen him for years ago. Pain is intermittent, with varying intensities. He does have a history of surgery on the left knee. Stairs and inclines seem to make the pain worse.   Rates his severity pain is 7 out of 10.  Has not noticed any significant inflammation.  Wants to continue to be active though.     Past Medical History:  Diagnosis Date  . Allergy    No past surgical history on file. Social History   Socioeconomic History  . Marital status: Married    Spouse name: Not on file  . Number of children: Not on file  . Years of education: Not on file  . Highest education level: Not on file  Social Needs  . Financial resource strain: Not on file  . Food insecurity - worry: Not on file  . Food insecurity - inability: Not on file  . Transportation needs - medical: Not on file  . Transportation needs - non-medical: Not on file  Occupational History  . Not on file  Tobacco Use  . Smoking status: Former Research scientist (life sciences)  . Smokeless tobacco: Never Used  Substance and Sexual Activity  . Alcohol use: No  . Drug use: No  . Sexual activity: Not on file  Other Topics Concern  . Not on file  Social History Narrative  . Not on file   Not on File Family History  Problem Relation Age of Onset  . Heart disease Mother   . Diabetes Mother   . Heart disease Father   . Diabetes Father      Past medical history, social, surgical and family history all reviewed in electronic medical record.  No pertanent information unless stated regarding to the chief complaint.   Review of Systems:Review of systems updated and as  accurate as of 06/03/17  No headache, visual changes, nausea, vomiting, diarrhea, constipation, dizziness, abdominal pain, skin rash, fevers, chills, night sweats, weight loss, swollen lymph nodes, body aches, joint swelling,, chest pain, shortness of breath, mood changes.  Positive muscle aches  Objective  There were no vitals taken for this visit. Systems examined below as of 06/03/17   General: No apparent distress alert and oriented x3 mood and affect normal, dressed appropriately.  HEENT: Pupils equal, extraocular movements intact  Respiratory: Patient's speak in full sentences and does not appear short of breath  Cardiovascular: No lower extremity edema, non tender, no erythema  Skin: Warm dry intact with no signs of infection or rash on extremities or on axial skeleton.  Abdomen: Soft nontender  Neuro: Cranial nerves II through XII are intact, neurovascularly intact in all extremities with 2+ DTRs and 2+ pulses.  Lymph: No lymphadenopathy of posterior or anterior cervical chain or axillae bilaterally.  Gait mild antalgic gait MSK:  Non tender with full range of motion and good stability and symmetric strength and tone of shoulders, elbows, wrist, hip, and ankles bilaterally.  Knee: Left valgus deformity noted. Large thigh to calf ratio.  Tender to palpation over medial and PF joint line.  ROM full in  flexion and extension and lower leg rotation. instability with valgus force.  painful patellar compression. Patellar glide with moderate crepitus. Patellar and quadriceps tendons unremarkable. Hamstring and quadriceps strength is normal. Contralateral knee shows arthritic changes with no pain  MSK US performed of: Left knee This study was ordered, performed, and interpreted by Charlann Boxer D.O.  Knee: Postsurgical changes of patient's meniscus noted.  Significant narrowing of the medial joint space noted.  Patient is also narrowing of the patellofemoral joint with  effusion.  IMPRESSION: Knee arthritis  After informed written and verbal consent, patient was seated on exam table. Left knee was prepped with alcohol swab and utilizing anterolateral approach, patient's left knee space was injected with 4:1  marcaine 0.5%: Kenalog 40mg /dL. Patient tolerated the procedure well without immediate complications.   Impression and Recommendations:     This case required medical decision making of moderate complexity.      Note: This dictation was prepared with Dragon dictation along with smaller phrase technology. Any transcriptional errors that result from this process are unintentional.

## 2017-06-04 ENCOUNTER — Ambulatory Visit: Payer: Self-pay

## 2017-06-04 ENCOUNTER — Encounter: Payer: Self-pay | Admitting: Family Medicine

## 2017-06-04 ENCOUNTER — Ambulatory Visit: Payer: PPO | Admitting: Family Medicine

## 2017-06-04 VITALS — BP 132/72 | HR 74 | Ht 67.0 in | Wt 170.0 lb

## 2017-06-04 DIAGNOSIS — G8929 Other chronic pain: Secondary | ICD-10-CM | POA: Diagnosis not present

## 2017-06-04 DIAGNOSIS — M25562 Pain in left knee: Principal | ICD-10-CM

## 2017-06-04 DIAGNOSIS — M1712 Unilateral primary osteoarthritis, left knee: Secondary | ICD-10-CM | POA: Diagnosis not present

## 2017-06-04 MED ORDER — VITAMIN D (ERGOCALCIFEROL) 1.25 MG (50000 UNIT) PO CAPS: 50000.0000 [IU] | ORAL_CAPSULE | ORAL | 0 refills | Status: DC

## 2017-06-04 MED ORDER — DICLOFENAC SODIUM 1 % TD GEL: 2.0000 g | Freq: Two times a day (BID) | TRANSDERMAL | 11 refills | Status: DC

## 2017-06-04 NOTE — Patient Instructions (Addendum)
Good to see you  James Peck is your friend. Ice 20 minutes 2 times daily. Usually after activity and before bed. Voltaren gel 2 times a day for pain  Once weekly vitamin D for 12 weeks.   Keep wearing good shoes Read about orthovisc and we will consider it at follow up  See me again in 4 weeks

## 2017-06-04 NOTE — Assessment & Plan Note (Signed)
Patient given injection today secondary to the arthritis.  Patient will consider possible Visco supplementation if continues to have pain and will also consider the possibility of custom bracing.  Patient does have an abnormal thigh to calf ratio that would allow this.  Patient's will try the home exercise, icing regimen, and topical anti-inflammatories.  Once weekly vitamin D given for muscle strength and endurance.  Follow-up again in 4-6 weeks

## 2017-06-24 DIAGNOSIS — E7800 Pure hypercholesterolemia, unspecified: Secondary | ICD-10-CM | POA: Diagnosis not present

## 2017-06-24 DIAGNOSIS — Z79899 Other long term (current) drug therapy: Secondary | ICD-10-CM | POA: Diagnosis not present

## 2017-06-27 ENCOUNTER — Encounter: Payer: Self-pay | Admitting: *Deleted

## 2017-06-28 ENCOUNTER — Ambulatory Visit: Payer: PPO | Admitting: Anesthesiology

## 2017-06-28 ENCOUNTER — Encounter
Admission: RE | Disposition: A | Discharge status: Home / Self Care | Payer: Self-pay | Source: Ambulatory Visit | Attending: Unknown Physician Specialty

## 2017-06-28 ENCOUNTER — Ambulatory Visit
Admission: RE | Admit: 2017-06-28 | Discharge: 2017-06-28 | Disposition: A | Discharge status: Home / Self Care | Payer: PPO | Source: Ambulatory Visit | Attending: Unknown Physician Specialty | Admitting: Unknown Physician Specialty

## 2017-06-28 ENCOUNTER — Encounter: Payer: Self-pay | Admitting: *Deleted

## 2017-06-28 DIAGNOSIS — Z7982 Long term (current) use of aspirin: Secondary | ICD-10-CM | POA: Diagnosis not present

## 2017-06-28 DIAGNOSIS — Z86010 Personal history of colonic polyps: Secondary | ICD-10-CM | POA: Diagnosis not present

## 2017-06-28 DIAGNOSIS — K648 Other hemorrhoids: Secondary | ICD-10-CM | POA: Diagnosis not present

## 2017-06-28 DIAGNOSIS — K219 Gastro-esophageal reflux disease without esophagitis: Secondary | ICD-10-CM | POA: Insufficient documentation

## 2017-06-28 DIAGNOSIS — D126 Benign neoplasm of colon, unspecified: Secondary | ICD-10-CM | POA: Diagnosis not present

## 2017-06-28 DIAGNOSIS — D124 Benign neoplasm of descending colon: Secondary | ICD-10-CM | POA: Diagnosis not present

## 2017-06-28 DIAGNOSIS — Z1211 Encounter for screening for malignant neoplasm of colon: Secondary | ICD-10-CM | POA: Diagnosis not present

## 2017-06-28 DIAGNOSIS — D120 Benign neoplasm of cecum: Secondary | ICD-10-CM | POA: Insufficient documentation

## 2017-06-28 DIAGNOSIS — Z8249 Family history of ischemic heart disease and other diseases of the circulatory system: Secondary | ICD-10-CM | POA: Diagnosis not present

## 2017-06-28 DIAGNOSIS — D122 Benign neoplasm of ascending colon: Secondary | ICD-10-CM | POA: Diagnosis not present

## 2017-06-28 DIAGNOSIS — Z79899 Other long term (current) drug therapy: Secondary | ICD-10-CM | POA: Insufficient documentation

## 2017-06-28 DIAGNOSIS — E7800 Pure hypercholesterolemia, unspecified: Secondary | ICD-10-CM | POA: Insufficient documentation

## 2017-06-28 DIAGNOSIS — Z833 Family history of diabetes mellitus: Secondary | ICD-10-CM | POA: Diagnosis not present

## 2017-06-28 DIAGNOSIS — M1990 Unspecified osteoarthritis, unspecified site: Secondary | ICD-10-CM | POA: Diagnosis not present

## 2017-06-28 DIAGNOSIS — K635 Polyp of colon: Secondary | ICD-10-CM | POA: Diagnosis not present

## 2017-06-28 DIAGNOSIS — I10 Essential (primary) hypertension: Secondary | ICD-10-CM | POA: Diagnosis not present

## 2017-06-28 DIAGNOSIS — Z955 Presence of coronary angioplasty implant and graft: Secondary | ICD-10-CM | POA: Diagnosis not present

## 2017-06-28 DIAGNOSIS — I2510 Atherosclerotic heart disease of native coronary artery without angina pectoris: Secondary | ICD-10-CM | POA: Diagnosis not present

## 2017-06-28 DIAGNOSIS — Z87891 Personal history of nicotine dependence: Secondary | ICD-10-CM | POA: Diagnosis not present

## 2017-06-28 DIAGNOSIS — K649 Unspecified hemorrhoids: Secondary | ICD-10-CM | POA: Diagnosis not present

## 2017-06-28 HISTORY — DX: Atherosclerotic heart disease of native coronary artery without angina pectoris: I25.10

## 2017-06-28 HISTORY — DX: Essential (primary) hypertension: I10

## 2017-06-28 HISTORY — DX: Pure hypercholesterolemia, unspecified: E78.00

## 2017-06-28 HISTORY — PX: COLONOSCOPY WITH PROPOFOL: SHX5780

## 2017-06-28 SURGERY — COLONOSCOPY WITH PROPOFOL
Anesthesia: General

## 2017-06-28 MED ORDER — SODIUM CHLORIDE 0.9 % IV SOLN: INTRAVENOUS | Status: DC

## 2017-06-28 MED ORDER — SODIUM CHLORIDE 0.9 % IV SOLN
INTRAVENOUS | Status: DC | PRN
  Administered 2017-06-28: 09:00:00 via INTRAVENOUS

## 2017-06-28 MED ORDER — PROPOFOL 500 MG/50ML IV EMUL
INTRAVENOUS | Status: AC
  Filled 2017-06-28: qty 50

## 2017-06-28 MED ORDER — PROPOFOL 10 MG/ML IV BOLUS
INTRAVENOUS | Status: DC | PRN
  Administered 2017-06-28: 50 mg via INTRAVENOUS

## 2017-06-28 MED ORDER — PROPOFOL 500 MG/50ML IV EMUL
INTRAVENOUS | Status: DC | PRN
  Administered 2017-06-28: 120 ug/kg/min via INTRAVENOUS

## 2017-06-28 MED ORDER — LIDOCAINE HCL (PF) 2 % IJ SOLN
INTRAMUSCULAR | Status: DC | PRN
  Administered 2017-06-28: 50 mg via INTRADERMAL

## 2017-06-28 NOTE — H&P (Signed)
Primary Care Physician:  Derinda Late, MD Primary Gastroenterologist:  Dr. Vira Agar  Pre-Procedure History & Physical: HPI:  James Peck is a 76 y.o. male is here for an colonoscopy.   Past Medical History:  Diagnosis Date  . Allergy   . Coronary artery disease   . Hypercholesteremia   . Hypertension     Past Surgical History:  Procedure Laterality Date  . CARDIAC CATHETERIZATION    . cardiac stents      Prior to Admission medications   Medication Sig Start Date End Date Taking? Authorizing Provider  amLODipine (NORVASC) 10 MG tablet Take 1 tablet by mouth daily. 11/15/14  Yes [provider]  aspirin EC 81 MG tablet Take 1 tablet by mouth daily.    [provider]  calcium carbonate (OS-CAL - DOSED IN MG OF ELEMENTAL CALCIUM) 1250 (500 CA) MG tablet Take by mouth.    [provider]  Cetirizine HCl 10 MG CAPS Take 1 tablet by mouth daily. 11/15/14   [provider]  diclofenac sodium (VOLTAREN) 1 % GEL Apply 2 g 2 (two) times daily topically. To affected joint. 06/04/17   Lyndal Pulley, DO  fluticasone (FLONASE) 50 MCG/ACT nasal spray Place into the nose. 11/15/14   [provider]  Glucosamine-Chondroitin 750-600 MG TABS Take 1 capsule by mouth daily.    [provider]  Misc Natural Products (BLACK CHERRY CONCENTRATE PO) Take 1,000 mg daily by mouth.    [provider]  Omega-3 Fatty Acids (FISH OIL) 1000 MG CAPS Take 1 capsule by mouth daily.    [provider]  omeprazole (PRILOSEC) 20 MG capsule Take 1 capsule by mouth daily. 11/15/14   [provider]  ramipril (ALTACE) 5 MG capsule Take by mouth. 11/15/14   [provider]  simvastatin (ZOCOR) 40 MG tablet Take 1 tablet by mouth daily. 11/15/14   [provider]  tadalafil (CIALIS) 10 MG tablet Take 5 mg daily as needed by mouth for erectile dysfunction.    [provider]  Vitamin D, Ergocalciferol, (DRISDOL)  50000 units CAPS capsule Take 1 capsule (50,000 Units total) every 7 (seven) days by mouth. 06/04/17   Lyndal Pulley, DO    Allergies as of 05/13/2017  . (Not on File)    Family History  Problem Relation Age of Onset  . Heart disease Mother   . Diabetes Mother   . Heart disease Father   . Diabetes Father     Social History   Socioeconomic History  . Marital status: Married    Spouse name: Not on file  . Number of children: Not on file  . Years of education: Not on file  . Highest education level: Not on file  Social Needs  . Financial resource strain: Not on file  . Food insecurity - worry: Not on file  . Food insecurity - inability: Not on file  . Transportation needs - medical: Not on file  . Transportation needs - non-medical: Not on file  Occupational History  . Not on file  Tobacco Use  . Smoking status: Former Research scientist (life sciences)  . Smokeless tobacco: Never Used  Substance and Sexual Activity  . Alcohol use: No  . Drug use: No  . Sexual activity: Not on file  Other Topics Concern  . Not on file  Social History Narrative  . Not on file    Review of Systems: See HPI, otherwise negative ROS  Physical Exam: BP (!) 150/65  Pulse 68   Temp (!) 96.8 F (36 C) (Tympanic)   Resp 18   Ht 5\' 6"  (1.676 m)   Wt 72.6 kg (160 lb)   SpO2 96%   BMI 25.82 kg/m  General:   Alert,  pleasant and cooperative in NAD Head:  Normocephalic and atraumatic. Neck:  Supple; no masses or thyromegaly. Lungs:  Clear throughout to auscultation.    Heart:  Regular rate and rhythm. Abdomen:  Soft, nontender and nondistended. Normal bowel sounds, without guarding, and without rebound.   Neurologic:  Alert and  oriented x4;  grossly normal neurologically.  Impression/Plan: James Peck is here for an colonoscopy to be performed for [facility] colon polyps  Risks, benefits, limitations, and alternatives regarding  colonoscopy have been reviewed with the patient.  Questions have been answered.  All  parties agreeable.   Gaylyn Cheers, MD  06/28/2017, 8:43 AM

## 2017-06-28 NOTE — Anesthesia Preprocedure Evaluation (Signed)
Anesthesia Evaluation  Patient identified by MRN, date of birth, ID band Patient awake    Reviewed: Allergy & Precautions, NPO status , Patient's Chart, lab work & pertinent test results  History of Anesthesia Complications Negative for: history of anesthetic complications  Airway Mallampati: III  TM Distance: >3 FB Neck ROM: Full    Dental no notable dental hx.    Pulmonary neg sleep apnea, neg COPD, former smoker,    breath sounds clear to auscultation- rhonchi (-) wheezing      Cardiovascular hypertension, Pt. on medications (-) angina+ CAD and + Cardiac Stents (last stent 2014)  (-) Past MI  Rhythm:Regular Rate:Normal - Systolic murmurs and - Diastolic murmurs    Neuro/Psych negative neurological ROS  negative psych ROS   GI/Hepatic Neg liver ROS, GERD  ,  Endo/Other  negative endocrine ROSneg diabetes  Renal/GU negative Renal ROS     Musculoskeletal  (+) Arthritis ,   Abdominal (+) - obese,   Peds  Hematology negative hematology ROS (+)   Anesthesia Other Findings Past Medical History: No date: Allergy No date: Coronary artery disease No date: Hypercholesteremia No date: Hypertension   Reproductive/Obstetrics                             Anesthesia Physical Anesthesia Plan  ASA: III  Anesthesia Plan: General   Post-op Pain Management:    Induction: Intravenous  PONV Risk Score and Plan: 1 and Propofol infusion  Airway Management Planned: Natural Airway  Additional Equipment:   Intra-op Plan:   Post-operative Plan:   Informed Consent: I have reviewed the patients History and Physical, chart, labs and discussed the procedure including the risks, benefits and alternatives for the proposed anesthesia with the patient or authorized representative who has indicated his/her understanding and acceptance.   Dental advisory given  Plan Discussed with: CRNA and  Anesthesiologist  Anesthesia Plan Comments:         Anesthesia Quick Evaluation

## 2017-06-28 NOTE — Anesthesia Post-op Follow-up Note (Signed)
Anesthesia QCDR form completed.        

## 2017-06-28 NOTE — Op Note (Signed)
[facility] Patient Name: James Peck Procedure Date: 06/28/2017 8:49 AM MRN: [id number] Account #: [id number] Date of Birth: [birth year] Admit Type: Outpatient Age: 76 Room: [facility] ENDO ROOM 3 Gender: Male Note Status: Finalized Procedure:            Colonoscopy Indications:          High risk colon cancer surveillance: Personal history                        of colonic polyps Providers:            Manya Silvas, MD Referring MD:         Caprice Renshaw MD (Referring MD) Medicines:            Propofol per Anesthesia Complications:        No immediate complications. Procedure:            Pre-Anesthesia Assessment:                       - After reviewing the risks and benefits, the patient                        was deemed in satisfactory condition to undergo the                        procedure.                       After obtaining informed consent, the colonoscope was                        passed under direct vision. Throughout the procedure,                        the patient's blood pressure, pulse, and oxygen                        saturations were monitored continuously. The                        Colonoscope was introduced through the anus and                        advanced to the the cecum, identified by appendiceal                        orifice and ileocecal valve. The colonoscopy was                        performed without difficulty. The patient tolerated the                        procedure well. The quality of the bowel preparation                        was good. Findings:      A diminutive polyp was found in the cecum. The polyp was sessile. The       polyp was removed with a jumbo cold forceps. Resection and retrieval       were complete.      A small polyp was found  in the ascending colon. The polyp was sessile.       The polyp was removed with a cold snare. Resection and retrieval were       complete.      A small  polyp was found in the ascending colon. The polyp was sessile.       The polyp was removed with a hot snare. Resection and retrieval were       complete.      A small polyp was found in the descending colon. The polyp was sessile.       The polyp was removed with a hot snare. Resection and retrieval were       complete.      Internal hemorrhoids were found during endoscopy. The hemorrhoids were       small. Impression:           - One diminutive polyp in the cecum, removed with a                        jumbo cold forceps. Resected and retrieved.                       - One small polyp in the ascending colon, removed with                        a cold snare. Resected and retrieved.                       - One small polyp in the ascending colon, removed with                        a hot snare. Resected and retrieved.                       - One small polyp in the descending colon, removed with                        a hot snare. Resected and retrieved.                       - Internal hemorrhoids. Recommendation:       - Await pathology results. Manya Silvas, MD 06/28/2017 9:28:19 AM This report has been signed electronically. Number of Addenda: 0 Note Initiated On: 06/28/2017 8:49 AM Scope Withdrawal Time: 0 hours 21 minutes 59 seconds  Total Procedure Duration: 0 hours 29 minutes 20 seconds       [facility]

## 2017-06-28 NOTE — Anesthesia Postprocedure Evaluation (Signed)
Anesthesia Post Note  Patient: James Peck  Procedure(s) Performed: COLONOSCOPY WITH PROPOFOL (N/A )  Patient location during evaluation: Endoscopy Anesthesia Type: General Level of consciousness: awake and alert and oriented Pain management: pain level controlled Vital Signs Assessment: post-procedure vital signs reviewed and stable Respiratory status: spontaneous breathing, nonlabored ventilation and respiratory function stable Cardiovascular status: blood pressure returned to baseline and stable Postop Assessment: no signs of nausea or vomiting Anesthetic complications: no     Last Vitals:  Vitals:   06/28/17 0937 06/28/17 0947  BP: 125/67 (!) 118/58  Pulse: 64 63  Resp: 14 14  Temp:    SpO2: 98% 98%    Last Pain:  Vitals:   06/28/17 0927  TempSrc: Tympanic                 Necie Wilcoxson

## 2017-06-28 NOTE — Transfer of Care (Signed)
Immediate Anesthesia Transfer of Care Note  Patient: James Peck  Procedure(s) Performed: COLONOSCOPY WITH PROPOFOL (N/A )  Patient Location: Endoscopy Unit  Anesthesia Type:General  Level of Consciousness: sedated  Airway & Oxygen Therapy: Patient Spontanous Breathing and Patient connected to nasal cannula oxygen  Post-op Assessment: Report given to RN and Post -op Vital signs reviewed and stable  Post vital signs: Reviewed and stable  Last Vitals:  Vitals:   06/28/17 0832  BP: (!) 150/65  Pulse: 68  Resp: 18  Temp: (!) 36 C  SpO2: 96%    Last Pain:  Vitals:   06/28/17 0832  TempSrc: Tympanic         Complications: No apparent anesthesia complications

## 2017-07-01 ENCOUNTER — Encounter: Payer: Self-pay | Admitting: Unknown Physician Specialty

## 2017-07-01 DIAGNOSIS — Z0000 Encounter for general adult medical examination without abnormal findings: Secondary | ICD-10-CM | POA: Diagnosis not present

## 2017-07-02 NOTE — Progress Notes (Signed)
[name] [address] [place], [place] [address] Phone: [phone] Subjective:    I'm seeing this patient by the request  of:    CC: Left knee pain  QIW:[id number]  James Peck is a 76 y.o. male coming in with complaint of knee pain.  Found to have severe arthritis.  Patient was given an injection 4 weeks ago.  Patient wants to do conservative therapy.  Patient states doing 90% better at this time.  Noticing no swelling.  States that after sitting for a long amount of time does have some discomfort but once he starts walking seems to improve again.      Past Medical History:  Diagnosis Date  . Allergy   . Coronary artery disease   . Hypercholesteremia   . Hypertension    Past Surgical History:  Procedure Laterality Date  . CARDIAC CATHETERIZATION    . cardiac stents    . COLONOSCOPY WITH PROPOFOL N/A 06/28/2017   Procedure: COLONOSCOPY WITH PROPOFOL;  Surgeon: Manya Silvas, MD;  Location: [facility] ENDOSCOPY;  Service: Endoscopy;  Laterality: N/A;   Social History   Socioeconomic History  . Marital status: Married    Spouse name: None  . Number of children: None  . Years of education: None  . Highest education level: None  Social Needs  . Financial resource strain: None  . Food insecurity - worry: None  . Food insecurity - inability: None  . Transportation needs - medical: None  . Transportation needs - non-medical: None  Occupational History  . None  Tobacco Use  . Smoking status: Former Research scientist (life sciences)  . Smokeless tobacco: Never Used  Substance and Sexual Activity  . Alcohol use: No  . Drug use: No  . Sexual activity: None  Other Topics Concern  . None  Social History Narrative  . None   No Known Allergies Family History  Problem Relation Age of Onset  . Heart disease Mother   . Diabetes Mother   . Heart disease Father   . Diabetes Father      Past medical history, social, surgical and family history all reviewed in  electronic medical record.  No pertanent information unless stated regarding to the chief complaint.   Review of Systems:Review of systems updated and as accurate as of 07/03/17  No headache, visual changes, nausea, vomiting, diarrhea, constipation, dizziness, abdominal pain, skin rash, fevers, chills, night sweats, weight loss, swollen lymph nodes, body aches, joint swelling, chest pain, shortness of breath, mood changes.  Positive muscle aches  Objective  Blood pressure 104/60, pulse 80, height 5\' 5"  (1.651 m), weight 169 lb (76.7 kg), SpO2 94 %. Systems examined below as of 07/03/17   General: No apparent distress alert and oriented x3 mood and affect normal, dressed appropriately.  HEENT: Pupils equal, extraocular movements intact  Respiratory: Patient's speak in full sentences and does not appear short of breath  Cardiovascular: No lower extremity edema, non tender, no erythema  Skin: Warm dry intact with no signs of infection or rash on extremities or on axial skeleton.  Abdomen: Soft nontender  Neuro: Cranial nerves II through XII are intact, neurovascularly intact in all extremities with 2+ DTRs and 2+ pulses.  Lymph: No lymphadenopathy of posterior or anterior cervical chain or axillae bilaterally.  Gait mild antalgic gait MSK:  Non tender with full range of motion and good stability and symmetric strength and tone of shoulders, elbows, wrist, hip, and ankles bilaterally.  Knee: Left  valgus deformity noted.  Abnormal thigh to calf ratio.  Pain ROM full in flexion and extension and lower leg rotation. instability with valgus force.  Mild painful patellar compression. Patellar glide with mild crepitus. Patellar and quadriceps tendons unremarkable. Hamstring and quadriceps strength is normal. Contralateral knee shows no pain   Impression and Recommendations:     This case required medical decision making of moderate complexity.      Note: This dictation was prepared with  Dragon dictation along with smaller phrase technology. Any transcriptional errors that result from this process are unintentional.

## 2017-07-03 ENCOUNTER — Ambulatory Visit: Payer: PPO | Admitting: Family Medicine

## 2017-07-03 ENCOUNTER — Encounter: Payer: Self-pay | Admitting: Family Medicine

## 2017-07-03 DIAGNOSIS — M1712 Unilateral primary osteoarthritis, left knee: Secondary | ICD-10-CM | POA: Diagnosis not present

## 2017-07-03 NOTE — Patient Instructions (Signed)
Happy holidays! Happy New Year! See me again in 6 weeks

## 2017-07-03 NOTE — Assessment & Plan Note (Signed)
Doing well 1 month after the injection.  Encourage patient to continue with conservative therapy at this time.  Patient will come back and see me again in 6 weeks.  Could still be a candidate for Visco supplementation if necessary.

## 2017-07-11 LAB — SURGICAL PATHOLOGY

## 2017-08-14 ENCOUNTER — Encounter: Payer: Self-pay | Admitting: Family Medicine

## 2017-08-14 ENCOUNTER — Ambulatory Visit (INDEPENDENT_AMBULATORY_CARE_PROVIDER_SITE_OTHER): Payer: PPO | Admitting: Family Medicine

## 2017-08-14 DIAGNOSIS — M1712 Unilateral primary osteoarthritis, left knee: Secondary | ICD-10-CM

## 2017-08-14 NOTE — Patient Instructions (Signed)
Good to see you  Injected the knee again today  I hope we get at least 10 weeks, if not then the other injections See me again in 10 weeks!

## 2017-08-14 NOTE — Progress Notes (Signed)
[name] [address], [place] [address] Phone: [phone] Subjective:     CC: Left knee pain  [id number]  James Peck is a 77 y.o. male coming in with complaint of left knee pain.  Found to have severe arthritis.  Patient did have an injection and was doing significantly better July 03, 2017.  For the last 6 weeks patient has been doing conservative therapy.  Patient states that his knee is getting worse. He does have sharp pain with some movements.     Past Medical History:  Diagnosis Date  . Allergy   . Coronary artery disease   . Hypercholesteremia   . Hypertension    Past Surgical History:  Procedure Laterality Date  . CARDIAC CATHETERIZATION    . cardiac stents    . COLONOSCOPY WITH PROPOFOL N/A 06/28/2017   Procedure: COLONOSCOPY WITH PROPOFOL;  Surgeon: Manya Silvas, MD;  Location: [facility] ENDOSCOPY;  Service: Endoscopy;  Laterality: N/A;   Social History   Socioeconomic History  . Marital status: Married    Spouse name: None  . Number of children: None  . Years of education: None  . Highest education level: None  Social Needs  . Financial resource strain: None  . Food insecurity - worry: None  . Food insecurity - inability: None  . Transportation needs - medical: None  . Transportation needs - non-medical: None  Occupational History  . None  Tobacco Use  . Smoking status: Former Research scientist (life sciences)  . Smokeless tobacco: Never Used  Substance and Sexual Activity  . Alcohol use: No  . Drug use: No  . Sexual activity: None  Other Topics Concern  . None  Social History Narrative  . None   No Known Allergies Family History  Problem Relation Age of Onset  . Heart disease Mother   . Diabetes Mother   . Heart disease Father   . Diabetes Father      Past medical history, social, surgical and family history all reviewed in electronic medical record.  No pertanent information unless stated regarding to the chief  complaint.   Review of Systems:Review of systems updated and as accurate as of 08/14/17  No headache, visual changes, nausea, vomiting, diarrhea, constipation, dizziness, abdominal pain, skin rash, fevers, chills, night sweats, weight loss, swollen lymph nodes, body aches, joint swelling,chest pain, shortness of breath, mood changes.  Positive muscle aches  Objective  Blood pressure 112/66, pulse 84, height 5\' 6"  (1.676 m), weight 168 lb (76.2 kg), SpO2 95 %. Systems examined below as of 08/14/17   General: No apparent distress alert and oriented x3 mood and affect normal, dressed appropriately.  HEENT: Pupils equal, extraocular movements intact  Respiratory: Patient's speak in full sentences and does not appear short of breath  Cardiovascular: No lower extremity edema, non tender, no erythema  Skin: Warm dry intact with no signs of infection or rash on extremities or on axial skeleton.  Abdomen: Soft nontender  Neuro: Cranial nerves II through XII are intact, neurovascularly intact in all extremities with 2+ DTRs and 2+ pulses.  Lymph: No lymphadenopathy of posterior or anterior cervical chain or axillae bilaterally.  Gait antalgic gait MSK:  Non tender with full range of motion and good stability and symmetric strength and tone of shoulders, elbows, wrist, hip, and ankles bilaterally.  Knee: Left valgus deformity noted.  Abnormal thigh to calf ratio.  Tender to palpation over medial and PF joint line.  ROM full in  flexion and extension and lower leg rotation. instability with valgus force.  painful patellar compression. Patellar glide with moderate crepitus. Patellar and quadriceps tendons unremarkable. Hamstring and quadriceps strength is normal. Contralateral knee shows minimal arthritic changes and stable.  After informed written and verbal consent, patient was seated on exam table. Left knee was prepped with alcohol swab and utilizing anterolateral approach, patient's left knee  space was injected with 4:1  marcaine 0.5%: Kenalog 40mg /dL. Patient tolerated the procedure well without immediate complications.    Impression and Recommendations:     This case required medical decision making of moderate complexity.      Note: This dictation was prepared with Dragon dictation along with smaller phrase technology. Any transcriptional errors that result from this process are unintentional.

## 2017-08-14 NOTE — Assessment & Plan Note (Signed)
Patient given an injection today.  Tolerated the procedure well.  We discussed icing regimen and home exercises.  We discussed which activities to do which wants to avoid.  Could be a candidate for Visco supplementation.  Continue all other conservative therapy.  Follow-up again in 4 weeks if worsening pain otherwise 10 weeks.

## 2017-08-15 DIAGNOSIS — H2513 Age-related nuclear cataract, bilateral: Secondary | ICD-10-CM | POA: Diagnosis not present

## 2017-08-26 ENCOUNTER — Ambulatory Visit (INDEPENDENT_AMBULATORY_CARE_PROVIDER_SITE_OTHER): Payer: PPO | Admitting: Internal Medicine

## 2017-08-26 ENCOUNTER — Encounter: Payer: Self-pay | Admitting: Internal Medicine

## 2017-08-26 VITALS — BP 128/60 | HR 79 | Temp 98.6°F | Ht 66.0 in | Wt 168.4 lb

## 2017-08-26 DIAGNOSIS — D126 Benign neoplasm of colon, unspecified: Secondary | ICD-10-CM

## 2017-08-26 DIAGNOSIS — K219 Gastro-esophageal reflux disease without esophagitis: Secondary | ICD-10-CM | POA: Diagnosis not present

## 2017-08-26 DIAGNOSIS — I10 Essential (primary) hypertension: Secondary | ICD-10-CM | POA: Diagnosis not present

## 2017-08-26 DIAGNOSIS — I2510 Atherosclerotic heart disease of native coronary artery without angina pectoris: Secondary | ICD-10-CM | POA: Diagnosis not present

## 2017-08-26 DIAGNOSIS — E785 Hyperlipidemia, unspecified: Secondary | ICD-10-CM

## 2017-08-26 DIAGNOSIS — Z125 Encounter for screening for malignant neoplasm of prostate: Secondary | ICD-10-CM

## 2017-08-26 MED ORDER — FLUTICASONE PROPIONATE 50 MCG/ACT NA SUSP: 2.0000 | Freq: Every day | NASAL | 4 refills | Status: DC

## 2017-08-26 NOTE — Progress Notes (Signed)
Pre visit review using our clinic review tool, if applicable. No additional management support is needed unless otherwise documented below in the visit note. 

## 2017-08-26 NOTE — Progress Notes (Signed)
Patient ID: James Peck, male   DOB: [birth year], 77 y.o.   MRN: [id number]   Subjective:    Patient ID: James Peck, male    DOB: 21-Oct-1940, 77 y.o.   MRN: [id number]  HPI  Patient here to establish care.  Former pt of Dr Baldemar Lenis.  Has a history of CAD.  S/p PCI of the RCA in 1999 as well as a PCI of the LAD in 2014.  Followed by Dr Ubaldo Glassing.  Doing well.  Had negative stress test 01/2017.  Denies any cardiac symptoms currently.  No chest pain.  Stays active. Works on his farm.  Walks 30 minutes a day.  Has a history of acid reflux.  On omeprazole.  No abdominal pain.  Bowels stable.  States had colonoscopy 3 years ago.  Quit smoking prior to 2000.  Has been told previously had a fatty liver.  States he adjusted his diet and this improved.  On vitamin D.  No urinary complaints.    Past Medical History:  Diagnosis Date  . Allergy   . Coronary artery disease   . Hypercholesteremia   . Hypertension    Past Surgical History:  Procedure Laterality Date  . CARDIAC CATHETERIZATION    . cardiac stents    . COLONOSCOPY WITH PROPOFOL N/A 06/28/2017   Procedure: COLONOSCOPY WITH PROPOFOL;  Surgeon: Manya Silvas, MD;  Location: [facility] ENDOSCOPY;  Service: Endoscopy;  Laterality: N/A;   Family History  Problem Relation Age of Onset  . Heart disease Mother   . Diabetes Mother   . Heart disease Father   . Diabetes Father    Social History   Socioeconomic History  . Marital status: Married    Spouse name: None  . Number of children: None  . Years of education: None  . Highest education level: None  Social Needs  . Financial resource strain: None  . Food insecurity - worry: None  . Food insecurity - inability: None  . Transportation needs - medical: None  . Transportation needs - non-medical: None  Occupational History  . None  Tobacco Use  . Smoking status: Former Research scientist (life sciences)  . Smokeless tobacco: Never Used  Substance and Sexual Activity  . Alcohol use: No  . Drug use: No  . Sexual  activity: None  Other Topics Concern  . None  Social History Narrative  . None    Outpatient Encounter Medications as of 08/26/2017  Medication Sig  . amLODipine (NORVASC) 10 MG tablet Take 1 tablet by mouth daily.  Marland Kitchen aspirin EC 81 MG tablet Take 1 tablet by mouth daily.  . calcium carbonate (OS-CAL - DOSED IN MG OF ELEMENTAL CALCIUM) 1250 (500 CA) MG tablet Take by mouth.  . Cetirizine HCl 10 MG CAPS Take 1 tablet by mouth daily.  . diclofenac sodium (VOLTAREN) 1 % GEL Apply 2 g 2 (two) times daily topically. To affected joint.  . fluticasone (FLONASE) 50 MCG/ACT nasal spray Place 2 sprays into both nostrils daily.  . Glucosamine-Chondroitin 750-600 MG TABS Take 1 capsule by mouth daily.  . Misc Natural Products (BLACK CHERRY CONCENTRATE PO) Take 1,000 mg daily by mouth.  . Omega-3 Fatty Acids (FISH OIL) 1000 MG CAPS Take 1 capsule by mouth daily.  Marland Kitchen omeprazole (PRILOSEC) 20 MG capsule Take 1 capsule by mouth daily.  . ramipril (ALTACE) 5 MG capsule Take by mouth.  . simvastatin (ZOCOR) 40 MG tablet Take 1 tablet by mouth daily.  . tadalafil (CIALIS) 10 MG tablet  Take 5 mg daily as needed by mouth for erectile dysfunction.  . Vitamin D, Ergocalciferol, (DRISDOL) 50000 units CAPS capsule Take 1 capsule (50,000 Units total) every 7 (seven) days by mouth.  . [DISCONTINUED] fluticasone (FLONASE) 50 MCG/ACT nasal spray Place into the nose.   No facility-administered encounter medications on file as of 08/26/2017.     Review of Systems  Constitutional: Negative for appetite change and unexpected weight change.  HENT: Negative for congestion and sinus pressure.   Respiratory: Negative for cough, chest tightness and shortness of breath.   Cardiovascular: Negative for chest pain, palpitations and leg swelling.  Gastrointestinal: Negative for abdominal pain, diarrhea, nausea and vomiting.  Genitourinary: Negative for difficulty urinating and dysuria.  Musculoskeletal: Negative for joint  swelling and myalgias.  Skin: Negative for color change and rash.  Neurological: Negative for dizziness, light-headedness and headaches.  Psychiatric/Behavioral: Negative for agitation and dysphoric mood.       Objective:     Blood pressure rechecked by me:  128/72  Physical Exam  Constitutional: He appears well-developed and well-nourished. No distress.  HENT:  Nose: Nose normal.  Mouth/Throat: Oropharynx is clear and moist.  Eyes: Conjunctivae are normal. Right eye exhibits no discharge. Left eye exhibits no discharge.  Neck: Neck supple. No thyromegaly present.  Cardiovascular: Normal rate and regular rhythm.  Pulmonary/Chest: Effort normal and breath sounds normal. No respiratory distress.  Abdominal: Soft. Bowel sounds are normal. There is no tenderness.  Musculoskeletal: He exhibits no edema or tenderness.  Lymphadenopathy:    He has no cervical adenopathy.  Skin: No rash noted. No erythema.  Psychiatric: He has a normal mood and affect. His behavior is normal.    BP 128/60   Pulse 79   Temp 98.6 F (37 C) (Oral)   Ht 5\' 6"  (1.676 m)   Wt 168 lb 6.4 oz (76.4 kg)   SpO2 94%   BMI 27.18 kg/m  Wt Readings from Last 3 Encounters:  08/26/17 168 lb 6.4 oz (76.4 kg)  08/14/17 168 lb (76.2 kg)  07/03/17 169 lb (76.7 kg)     Lab Results  Component Value Date   GLUCOSE 127 (H) 11/06/2012   NA 137 11/06/2012   K 3.8 11/06/2012   CL 104 11/06/2012   CREATININE 0.96 11/06/2012   BUN 12 11/06/2012   CO2 26 11/06/2012       Assessment & Plan:   Problem List Items Addressed This Visit    Acid reflux    Controlled on current regimen.  Follow.       Adenomatous colon polyp    States up to date with colonoscopy.  Sees Dr Tiffany Kocher.  Colonoscopy 05/2017 - removal of multiple polyps and internal hemorrhoids.  Continue f/u with Dr Tiffany Kocher.        Arteriosclerosis of coronary artery    Has known CAD.  Doing well.  Sees Dr Ubaldo Glassing.  Continue risk factor modification.         Benign essential HTN    Blood pressure under good control.  Continue same medication regimen.  Follow pressures.  Follow metabolic panel.        Relevant Orders   TSH   Basic metabolic panel   Varicella zoster antibody, IgG   HLD (hyperlipidemia)    Low cholesterol diet and exercise.  On simvastatin.  Follow lipid panel and liver function tests.        Relevant Orders   Hepatic function panel   Lipid panel  Other Visit Diagnoses    Prostate cancer screening    -  Primary   Relevant Orders   PSA, Medicare       Einar Pheasant, MD

## 2017-08-27 DIAGNOSIS — H2511 Age-related nuclear cataract, right eye: Secondary | ICD-10-CM | POA: Diagnosis not present

## 2017-08-29 ENCOUNTER — Encounter: Payer: Self-pay | Admitting: Internal Medicine

## 2017-08-29 NOTE — Assessment & Plan Note (Signed)
Controlled on current regimen.  Follow.  

## 2017-08-29 NOTE — Assessment & Plan Note (Signed)
Low cholesterol diet and exercise.  On simvastatin.  Follow lipid panel and liver function tests.   

## 2017-08-29 NOTE — Assessment & Plan Note (Signed)
States up to date with colonoscopy.  Sees Dr Tiffany Kocher.  Colonoscopy 05/2017 - removal of multiple polyps and internal hemorrhoids.  Continue f/u with Dr Tiffany Kocher.

## 2017-08-29 NOTE — Assessment & Plan Note (Signed)
Has known CAD.  Doing well.  Sees Dr Ubaldo Glassing.  Continue risk factor modification.

## 2017-08-29 NOTE — Assessment & Plan Note (Signed)
Blood pressure under good control.  Continue same medication regimen.  Follow pressures.  Follow metabolic panel.   

## 2017-09-03 ENCOUNTER — Encounter: Payer: Self-pay | Admitting: *Deleted

## 2017-09-10 ENCOUNTER — Encounter
Admission: RE | Disposition: A | Discharge status: Home / Self Care | Payer: Self-pay | Source: Ambulatory Visit | Attending: Ophthalmology

## 2017-09-10 ENCOUNTER — Ambulatory Visit: Payer: PPO | Admitting: Certified Registered Nurse Anesthetist

## 2017-09-10 ENCOUNTER — Ambulatory Visit
Admission: RE | Admit: 2017-09-10 | Discharge: 2017-09-10 | Disposition: A | Discharge status: Home / Self Care | Payer: PPO | Source: Ambulatory Visit | Attending: Ophthalmology | Admitting: Ophthalmology

## 2017-09-10 DIAGNOSIS — I10 Essential (primary) hypertension: Secondary | ICD-10-CM | POA: Insufficient documentation

## 2017-09-10 DIAGNOSIS — Z7982 Long term (current) use of aspirin: Secondary | ICD-10-CM | POA: Insufficient documentation

## 2017-09-10 DIAGNOSIS — E7800 Pure hypercholesterolemia, unspecified: Secondary | ICD-10-CM | POA: Insufficient documentation

## 2017-09-10 DIAGNOSIS — Z79899 Other long term (current) drug therapy: Secondary | ICD-10-CM | POA: Insufficient documentation

## 2017-09-10 DIAGNOSIS — H2511 Age-related nuclear cataract, right eye: Secondary | ICD-10-CM | POA: Diagnosis not present

## 2017-09-10 DIAGNOSIS — M1990 Unspecified osteoarthritis, unspecified site: Secondary | ICD-10-CM | POA: Diagnosis not present

## 2017-09-10 DIAGNOSIS — Z87891 Personal history of nicotine dependence: Secondary | ICD-10-CM | POA: Diagnosis not present

## 2017-09-10 DIAGNOSIS — K219 Gastro-esophageal reflux disease without esophagitis: Secondary | ICD-10-CM | POA: Diagnosis not present

## 2017-09-10 DIAGNOSIS — I2510 Atherosclerotic heart disease of native coronary artery without angina pectoris: Secondary | ICD-10-CM | POA: Diagnosis not present

## 2017-09-10 DIAGNOSIS — Z955 Presence of coronary angioplasty implant and graft: Secondary | ICD-10-CM | POA: Insufficient documentation

## 2017-09-10 HISTORY — DX: Gastro-esophageal reflux disease without esophagitis: K21.9

## 2017-09-10 HISTORY — PX: CATARACT EXTRACTION W/PHACO: SHX586

## 2017-09-10 HISTORY — DX: Unspecified osteoarthritis, unspecified site: M19.90

## 2017-09-10 HISTORY — DX: Personal history of other diseases of the digestive system: Z87.19

## 2017-09-10 SURGERY — PHACOEMULSIFICATION, CATARACT, WITH IOL INSERTION
Anesthesia: Monitor Anesthesia Care | Site: Eye | Laterality: Right | Wound class: Clean

## 2017-09-10 MED ORDER — LIDOCAINE HCL (PF) 4 % IJ SOLN
INTRAMUSCULAR | Status: AC
  Filled 2017-09-10: qty 5

## 2017-09-10 MED ORDER — POVIDONE-IODINE 5 % OP SOLN
OPHTHALMIC | Status: DC | PRN
  Administered 2017-09-10: 1 via OPHTHALMIC

## 2017-09-10 MED ORDER — BSS IO SOLN
INTRAOCULAR | Status: DC | PRN
  Administered 2017-09-10: 4 mL via OPHTHALMIC

## 2017-09-10 MED ORDER — FENTANYL CITRATE (PF) 100 MCG/2ML IJ SOLN
INTRAMUSCULAR | Status: DC | PRN
  Administered 2017-09-10: 50 ug via INTRAVENOUS

## 2017-09-10 MED ORDER — CARBACHOL 0.01 % IO SOLN
INTRAOCULAR | Status: DC | PRN
  Administered 2017-09-10: 0.5 mL via INTRAOCULAR

## 2017-09-10 MED ORDER — EPINEPHRINE PF 1 MG/ML IJ SOLN
INTRAOCULAR | Status: DC | PRN
  Administered 2017-09-10: 12:00:00 via OPHTHALMIC

## 2017-09-10 MED ORDER — CEFUROXIME OPHTHALMIC INJECTION 1 MG/0.1 ML: INJECTION | OPHTHALMIC | Status: DC | PRN

## 2017-09-10 MED ORDER — NA CHONDROIT SULF-NA HYALURON 40-17 MG/ML IO SOLN
INTRAOCULAR | Status: DC | PRN
  Administered 2017-09-10: 1 mL via INTRAOCULAR

## 2017-09-10 MED ORDER — MOXIFLOXACIN HCL 0.5 % OP SOLN
OPHTHALMIC | Status: AC
  Filled 2017-09-10: qty 3

## 2017-09-10 MED ORDER — FENTANYL CITRATE (PF) 100 MCG/2ML IJ SOLN
INTRAMUSCULAR | Status: AC
  Filled 2017-09-10: qty 2

## 2017-09-10 MED ORDER — EPINEPHRINE PF 1 MG/ML IJ SOLN
INTRAMUSCULAR | Status: AC
  Filled 2017-09-10: qty 2

## 2017-09-10 MED ORDER — POVIDONE-IODINE 5 % OP SOLN
OPHTHALMIC | Status: AC
  Filled 2017-09-10: qty 30

## 2017-09-10 MED ORDER — MOXIFLOXACIN HCL 0.5 % OP SOLN: 1.0000 [drp] | OPHTHALMIC | Status: DC | PRN

## 2017-09-10 MED ORDER — MOXIFLOXACIN HCL 0.5 % OP SOLN
OPHTHALMIC | Status: DC | PRN
  Administered 2017-09-10: 0.2 mL via OPHTHALMIC

## 2017-09-10 MED ORDER — SODIUM CHLORIDE 0.9 % IV SOLN
INTRAVENOUS | Status: DC
  Administered 2017-09-10: 11:00:00 via INTRAVENOUS

## 2017-09-10 MED ORDER — NA CHONDROIT SULF-NA HYALURON 40-17 MG/ML IO SOLN
INTRAOCULAR | Status: AC
  Filled 2017-09-10: qty 1

## 2017-09-10 MED ORDER — ARMC OPHTHALMIC DILATING DROPS
OPHTHALMIC | Status: AC
  Administered 2017-09-10: 1 via OPHTHALMIC
  Filled 2017-09-10: qty 0.4

## 2017-09-10 MED ORDER — ARMC OPHTHALMIC DILATING DROPS
1.0000 "application " | OPHTHALMIC | Status: AC
  Administered 2017-09-10 (×3): 1 via OPHTHALMIC

## 2017-09-10 SURGICAL SUPPLY — 16 items
GLOVE BIO SURGEON STRL SZ8 (GLOVE) ×3 IMPLANT
GLOVE BIOGEL M 6.5 STRL (GLOVE) ×3 IMPLANT
GLOVE SURG LX 8.0 MICRO (GLOVE) ×2
GLOVE SURG LX STRL 8.0 MICRO (GLOVE) ×1 IMPLANT
GOWN STRL REUS W/ TWL LRG LVL3 (GOWN DISPOSABLE) ×2 IMPLANT
GOWN STRL REUS W/TWL LRG LVL3 (GOWN DISPOSABLE) ×6
LABEL CATARACT MEDS ST (LABEL) ×3 IMPLANT
LENS IOL TECNIS ITEC 25.5 (Intraocular Lens) ×2 IMPLANT
PACK CATARACT (MISCELLANEOUS) ×3 IMPLANT
PACK CATARACT BRASINGTON LX (MISCELLANEOUS) ×3 IMPLANT
PACK EYE AFTER SURG (MISCELLANEOUS) ×3 IMPLANT
SOL BSS BAG (MISCELLANEOUS) ×3
SOLUTION BSS BAG (MISCELLANEOUS) ×1 IMPLANT
SYR 5ML LL (SYRINGE) ×3 IMPLANT
WATER STERILE IRR 250ML POUR (IV SOLUTION) ×3 IMPLANT
WIPE NON LINTING 3.25X3.25 (MISCELLANEOUS) ×3 IMPLANT

## 2017-09-10 NOTE — Anesthesia Postprocedure Evaluation (Signed)
Anesthesia Post Note  Patient: James Peck  Procedure(s) Performed: CATARACT EXTRACTION PHACO AND INTRAOCULAR LENS PLACEMENT (IOC) (Right Eye)  Patient location during evaluation: PACU Anesthesia Type: MAC Level of consciousness: awake and alert and oriented Pain management: pain level controlled Vital Signs Assessment: post-procedure vital signs reviewed and stable Respiratory status: spontaneous breathing, nonlabored ventilation and respiratory function stable Cardiovascular status: blood pressure returned to baseline and stable Postop Assessment: no signs of nausea or vomiting Anesthetic complications: no     Last Vitals:  Vitals:   09/10/17 1042 09/10/17 1222  BP: (!) 146/79 (!) 109/58  Pulse: 64 (!) 58  Resp: 16 15  Temp: 36.6 C 36.5 C  SpO2: 95% 96%    Last Pain:  Vitals:   09/10/17 1222  TempSrc: Temporal                 Jaslynn Thome

## 2017-09-10 NOTE — Anesthesia Post-op Follow-up Note (Signed)
Anesthesia QCDR form completed.        

## 2017-09-10 NOTE — Op Note (Signed)
PREOPERATIVE DIAGNOSIS:  Nuclear sclerotic cataract of the right eye.   POSTOPERATIVE DIAGNOSIS:  NUCLEAR SCLEROTIC CATARACT RIGHT EYE   OPERATIVE PROCEDURE: Procedure(s): CATARACT EXTRACTION PHACO AND INTRAOCULAR LENS PLACEMENT (IOC)   SURGEON:  Birder Robson, MD.   ANESTHESIA:  Anesthesiologist: Emmie Niemann, MD CRNA: Demetrius Charity, CRNA; Hedda Slade, CRNA  1.      Managed anesthesia care. 2.      0.1ml of Shugarcaine was instilled in the eye following the paracentesis.   COMPLICATIONS:  None.   TECHNIQUE:   Stop and chop   DESCRIPTION OF PROCEDURE:  The patient was examined and consented in the preoperative holding area where the aforementioned topical anesthesia was applied to the right eye and then brought back to the Operating Room where the right eye was prepped and draped in the usual sterile ophthalmic fashion and a lid speculum was placed. A paracentesis was created with the side port blade and the anterior chamber was filled with viscoelastic. A near clear corneal incision was performed with the steel keratome. A continuous curvilinear capsulorrhexis was performed with a cystotome followed by the capsulorrhexis forceps. Hydrodissection and hydrodelineation were carried out with BSS on a blunt cannula. The lens was removed in a stop and chop  technique and the remaining cortical material was removed with the irrigation-aspiration handpiece. The capsular bag was inflated with viscoelastic and the Technis [id number]  lens was placed in the capsular bag without complication. The remaining viscoelastic was removed from the eye with the irrigation-aspiration handpiece. The wounds were hydrated. The anterior chamber was flushed with Miostat and the eye was inflated to physiologic pressure. 0.73ml of Vigamox was placed in the anterior chamber. The wounds were found to be water tight. The eye was dressed with Vigamox. The patient was given protective glasses to wear throughout the day and a  shield with which to sleep tonight. The patient was also given drops with which to begin a drop regimen today and will follow-up with me in one day. Implant Name Type Inv. Item Serial No. Manufacturer Lot No. LRB No. Used  LENS IOL DIOP 25.5 - [id number] Intraocular Lens LENS IOL DIOP 25.5 [id number]  Right 1   Procedure(s) with comments: CATARACT EXTRACTION PHACO AND INTRAOCULAR LENS PLACEMENT (IOC) (Right) - [place] 01:01.7 AP% 15.3 CDE 9.38 Fluid pack lot # [id number]  Electronically signed: Birder Robson 09/10/2017 12:19 PM

## 2017-09-10 NOTE — Discharge Instructions (Signed)
Eye Surgery Discharge Instructions  Expect mild scratchy sensation or mild soreness. DO NOT RUB YOUR EYE!  The day of surgery:  Minimal physical activity, but bed rest is not required  No reading, computer work, or close hand work  No bending, lifting, or straining.  May watch TV  For 24 hours:  No driving, legal decisions, or alcoholic beverages  Safety precautions  Eat anything you prefer: It is better to start with liquids, then soup then solid foods.  _____ Eye patch should be worn until postoperative exam tomorrow.  ____ Solar shield eyeglasses should be worn for comfort in the sunlight/patch while sleeping  Resume all regular medications including aspirin or Coumadin if these were discontinued prior to surgery. You may shower, bathe, shave, or wash your hair. Tylenol may be taken for mild discomfort.  Call your doctor if you experience significant pain, nausea, or vomiting, fever > 101 or other signs of infection. [phone] or [phone] Specific instructions:  Follow-up Information    Birder Robson, MD Follow up.   Specialty:  Ophthalmology Why:  February 13th at 10:25 am. Contact information: [address] [place] [address] [phone]

## 2017-09-10 NOTE — Anesthesia Preprocedure Evaluation (Signed)
Anesthesia Evaluation  Patient identified by MRN, date of birth, ID band Patient awake    Reviewed: Allergy & Precautions, NPO status , Patient's Chart, lab work & pertinent test results  History of Anesthesia Complications Negative for: history of anesthetic complications  Airway Mallampati: II  TM Distance: >3 FB Neck ROM: Full    Dental no notable dental hx.    Pulmonary neg sleep apnea, neg COPD, former smoker,    breath sounds clear to auscultation- rhonchi (-) wheezing      Cardiovascular hypertension, Pt. on medications (-) angina+ CAD and + Cardiac Stents (~5 yrs ago)  (-) CABG  Rhythm:Regular Rate:Normal - Systolic murmurs and - Diastolic murmurs    Neuro/Psych negative neurological ROS  negative psych ROS   GI/Hepatic Neg liver ROS, hiatal hernia, GERD  ,  Endo/Other  negative endocrine ROSneg diabetes  Renal/GU negative Renal ROS     Musculoskeletal  (+) Arthritis ,   Abdominal (+) - obese,   Peds  Hematology negative hematology ROS (+)   Anesthesia Other Findings Past Medical History: No date: Allergy No date: Arthritis No date: Coronary artery disease No date: GERD (gastroesophageal reflux disease) No date: History of hiatal hernia No date: Hypercholesteremia No date: Hypertension   Reproductive/Obstetrics                             Anesthesia Physical Anesthesia Plan  ASA: III  Anesthesia Plan: MAC   Post-op Pain Management:    Induction: Intravenous  PONV Risk Score and Plan: 1 and Midazolam  Airway Management Planned: Natural Airway  Additional Equipment:   Intra-op Plan:   Post-operative Plan:   Informed Consent: I have reviewed the patients History and Physical, chart, labs and discussed the procedure including the risks, benefits and alternatives for the proposed anesthesia with the patient or authorized representative who has indicated his/her  understanding and acceptance.     Plan Discussed with: CRNA and Anesthesiologist  Anesthesia Plan Comments:         Anesthesia Quick Evaluation

## 2017-09-10 NOTE — Transfer of Care (Signed)
Immediate Anesthesia Transfer of Care Note  Patient: James Peck  Procedure(s) Performed: CATARACT EXTRACTION PHACO AND INTRAOCULAR LENS PLACEMENT (IOC) (Right Eye)  Patient Location: PACU  Anesthesia Type:MAC  Level of Consciousness: awake, alert  and oriented  Airway & Oxygen Therapy: Patient Spontanous Breathing  Post-op Assessment: Report given to RN and Post -op Vital signs reviewed and stable  Post vital signs: Reviewed and stable  Last Vitals:  Vitals:   09/10/17 1042  BP: (!) 146/79  Pulse: 64  Resp: 16  Temp: 36.6 C  SpO2: 95%    Last Pain:  Vitals:   09/10/17 1042  TempSrc: Oral         Complications: No apparent anesthesia complications

## 2017-09-10 NOTE — H&P (Signed)
All labs reviewed. Abnormal studies sent to patients PCP when indicated.  Previous H&P reviewed, patient examined, there are NO CHANGES.  James Peck Porfilio2/12/201911:53 AM

## 2017-09-11 ENCOUNTER — Encounter: Payer: Self-pay | Admitting: Ophthalmology

## 2017-09-19 DIAGNOSIS — H2512 Age-related nuclear cataract, left eye: Secondary | ICD-10-CM | POA: Diagnosis not present

## 2017-09-24 ENCOUNTER — Encounter: Payer: Self-pay | Admitting: *Deleted

## 2017-10-02 ENCOUNTER — Ambulatory Visit: Payer: PPO | Admitting: Certified Registered Nurse Anesthetist

## 2017-10-02 ENCOUNTER — Other Ambulatory Visit: Payer: Self-pay

## 2017-10-02 ENCOUNTER — Ambulatory Visit
Admission: RE | Admit: 2017-10-02 | Discharge: 2017-10-02 | Disposition: A | Discharge status: Home / Self Care | Payer: PPO | Source: Ambulatory Visit | Attending: Ophthalmology | Admitting: Ophthalmology

## 2017-10-02 ENCOUNTER — Encounter
Admission: RE | Disposition: A | Discharge status: Home / Self Care | Payer: Self-pay | Source: Ambulatory Visit | Attending: Ophthalmology

## 2017-10-02 DIAGNOSIS — I2510 Atherosclerotic heart disease of native coronary artery without angina pectoris: Secondary | ICD-10-CM | POA: Diagnosis not present

## 2017-10-02 DIAGNOSIS — K219 Gastro-esophageal reflux disease without esophagitis: Secondary | ICD-10-CM | POA: Insufficient documentation

## 2017-10-02 DIAGNOSIS — Z87891 Personal history of nicotine dependence: Secondary | ICD-10-CM | POA: Insufficient documentation

## 2017-10-02 DIAGNOSIS — Z955 Presence of coronary angioplasty implant and graft: Secondary | ICD-10-CM | POA: Insufficient documentation

## 2017-10-02 DIAGNOSIS — K449 Diaphragmatic hernia without obstruction or gangrene: Secondary | ICD-10-CM | POA: Diagnosis not present

## 2017-10-02 DIAGNOSIS — H2512 Age-related nuclear cataract, left eye: Secondary | ICD-10-CM | POA: Diagnosis not present

## 2017-10-02 DIAGNOSIS — E7800 Pure hypercholesterolemia, unspecified: Secondary | ICD-10-CM | POA: Diagnosis not present

## 2017-10-02 DIAGNOSIS — Z86010 Personal history of colonic polyps: Secondary | ICD-10-CM | POA: Diagnosis not present

## 2017-10-02 DIAGNOSIS — Z9841 Cataract extraction status, right eye: Secondary | ICD-10-CM | POA: Insufficient documentation

## 2017-10-02 DIAGNOSIS — I10 Essential (primary) hypertension: Secondary | ICD-10-CM | POA: Diagnosis not present

## 2017-10-02 DIAGNOSIS — M1990 Unspecified osteoarthritis, unspecified site: Secondary | ICD-10-CM | POA: Diagnosis not present

## 2017-10-02 HISTORY — PX: CATARACT EXTRACTION W/PHACO: SHX586

## 2017-10-02 SURGERY — PHACOEMULSIFICATION, CATARACT, WITH IOL INSERTION
Anesthesia: Monitor Anesthesia Care | Site: Eye | Laterality: Left | Wound class: Clean

## 2017-10-02 MED ORDER — MOXIFLOXACIN HCL 0.5 % OP SOLN
OPHTHALMIC | Status: DC | PRN
  Administered 2017-10-02: 0.2 mL via OPHTHALMIC

## 2017-10-02 MED ORDER — ARMC OPHTHALMIC DILATING DROPS
OPHTHALMIC | Status: AC
  Administered 2017-10-02: 1 via OPHTHALMIC
  Filled 2017-10-02: qty 0.4

## 2017-10-02 MED ORDER — LIDOCAINE HCL (PF) 4 % IJ SOLN
INTRAOCULAR | Status: DC | PRN
  Administered 2017-10-02: 4 mL via OPHTHALMIC

## 2017-10-02 MED ORDER — SODIUM CHLORIDE 0.9 % IV SOLN
INTRAVENOUS | Status: DC
  Administered 2017-10-02: 09:00:00 via INTRAVENOUS

## 2017-10-02 MED ORDER — MOXIFLOXACIN HCL 0.5 % OP SOLN
OPHTHALMIC | Status: AC
  Filled 2017-10-02: qty 3

## 2017-10-02 MED ORDER — POVIDONE-IODINE 5 % OP SOLN
OPHTHALMIC | Status: AC
  Filled 2017-10-02: qty 30

## 2017-10-02 MED ORDER — CARBACHOL 0.01 % IO SOLN
INTRAOCULAR | Status: DC | PRN
  Administered 2017-10-02: 0.5 mL via INTRAOCULAR

## 2017-10-02 MED ORDER — MOXIFLOXACIN HCL 0.5 % OP SOLN: 1.0000 [drp] | OPHTHALMIC | Status: DC | PRN

## 2017-10-02 MED ORDER — LIDOCAINE HCL (PF) 4 % IJ SOLN
INTRAMUSCULAR | Status: AC
  Filled 2017-10-02: qty 5

## 2017-10-02 MED ORDER — EPINEPHRINE PF 1 MG/ML IJ SOLN
INTRAOCULAR | Status: DC | PRN
  Administered 2017-10-02: 10:00:00 via OPHTHALMIC

## 2017-10-02 MED ORDER — POVIDONE-IODINE 5 % OP SOLN
OPHTHALMIC | Status: DC | PRN
  Administered 2017-10-02: 1 via OPHTHALMIC

## 2017-10-02 MED ORDER — NA CHONDROIT SULF-NA HYALURON 40-17 MG/ML IO SOLN
INTRAOCULAR | Status: AC
  Filled 2017-10-02: qty 1

## 2017-10-02 MED ORDER — MIDAZOLAM HCL 2 MG/2ML IJ SOLN
INTRAMUSCULAR | Status: DC | PRN
  Administered 2017-10-02: 1 mg via INTRAVENOUS

## 2017-10-02 MED ORDER — ARMC OPHTHALMIC DILATING DROPS
1.0000 "application " | OPHTHALMIC | Status: AC
  Administered 2017-10-02 (×3): 1 via OPHTHALMIC

## 2017-10-02 MED ORDER — NA CHONDROIT SULF-NA HYALURON 40-17 MG/ML IO SOLN
INTRAOCULAR | Status: DC | PRN
  Administered 2017-10-02: 1 mL via INTRAOCULAR

## 2017-10-02 MED ORDER — EPINEPHRINE PF 1 MG/ML IJ SOLN
INTRAMUSCULAR | Status: AC
  Filled 2017-10-02: qty 2

## 2017-10-02 MED ORDER — MIDAZOLAM HCL 2 MG/2ML IJ SOLN
INTRAMUSCULAR | Status: AC
  Filled 2017-10-02: qty 2

## 2017-10-02 SURGICAL SUPPLY — 16 items
GLOVE BIO SURGEON STRL SZ8 (GLOVE) ×3 IMPLANT
GLOVE BIOGEL M 6.5 STRL (GLOVE) ×3 IMPLANT
GLOVE SURG LX 8.0 MICRO (GLOVE) ×2
GLOVE SURG LX STRL 8.0 MICRO (GLOVE) ×1 IMPLANT
GOWN STRL REUS W/ TWL LRG LVL3 (GOWN DISPOSABLE) ×2 IMPLANT
GOWN STRL REUS W/TWL LRG LVL3 (GOWN DISPOSABLE) ×6
LABEL CATARACT MEDS ST (LABEL) ×3 IMPLANT
LENS IOL TECNIS ITEC 25.5 (Intraocular Lens) ×2 IMPLANT
PACK CATARACT (MISCELLANEOUS) ×3 IMPLANT
PACK CATARACT BRASINGTON LX (MISCELLANEOUS) ×3 IMPLANT
PACK EYE AFTER SURG (MISCELLANEOUS) ×3 IMPLANT
SOL BSS BAG (MISCELLANEOUS) ×3
SOLUTION BSS BAG (MISCELLANEOUS) ×1 IMPLANT
SYR 5ML LL (SYRINGE) ×3 IMPLANT
WATER STERILE IRR 250ML POUR (IV SOLUTION) ×3 IMPLANT
WIPE NON LINTING 3.25X3.25 (MISCELLANEOUS) ×3 IMPLANT

## 2017-10-02 NOTE — Transfer of Care (Signed)
Immediate Anesthesia Transfer of Care Note  Patient: James Peck  Procedure(s) Performed: CATARACT EXTRACTION PHACO AND INTRAOCULAR LENS PLACEMENT (IOC) (Left Eye)  Patient Location: Short Stay  Anesthesia Type:MAC  Level of Consciousness: awake, alert , oriented and patient cooperative  Airway & Oxygen Therapy: Patient Spontanous Breathing  Post-op Assessment: Report given to RN and Post -op Vital signs reviewed and stable  Post vital signs: Reviewed and stable  Last Vitals:  Vitals:   10/02/17 0852 10/02/17 1025  BP: (!) 128/41 (!) 109/57  Pulse: 75 65  Resp: 20   Temp: (!) 36.2 C (!) 36.2 C  SpO2: 99% 100%    Last Pain:  Vitals:   10/02/17 0852  TempSrc: Tympanic         Complications: No apparent anesthesia complications

## 2017-10-02 NOTE — Anesthesia Post-op Follow-up Note (Signed)
Anesthesia QCDR form completed.        

## 2017-10-02 NOTE — Op Note (Signed)
PREOPERATIVE DIAGNOSIS:  Nuclear sclerotic cataract of the left eye.   POSTOPERATIVE DIAGNOSIS:  Nuclear sclerotic cataract of the left eye.   OPERATIVE PROCEDURE: Procedure(s): CATARACT EXTRACTION PHACO AND INTRAOCULAR LENS PLACEMENT (IOC)   SURGEON:  Birder Robson, MD.   ANESTHESIA:  Anesthesiologist: Alvin Critchley, MD CRNA: Eben Burow, CRNA  1.      Managed anesthesia care. 2.     0.54ml of Shugarcaine was instilled following the paracentesis   COMPLICATIONS:  None.   TECHNIQUE:   Stop and chop   DESCRIPTION OF PROCEDURE:  The patient was examined and consented in the preoperative holding area where the aforementioned topical anesthesia was applied to the left eye and then brought back to the Operating Room where the left eye was prepped and draped in the usual sterile ophthalmic fashion and a lid speculum was placed. A paracentesis was created with the side port blade and the anterior chamber was filled with viscoelastic. A near clear corneal incision was performed with the steel keratome. A continuous curvilinear capsulorrhexis was performed with a cystotome followed by the capsulorrhexis forceps. Hydrodissection and hydrodelineation were carried out with BSS on a blunt cannula. The lens was removed in a stop and chop  technique and the remaining cortical material was removed with the irrigation-aspiration handpiece. The capsular bag was inflated with viscoelastic and the Technis [id number] lens was placed in the capsular bag without complication. The remaining viscoelastic was removed from the eye with the irrigation-aspiration handpiece. The wounds were hydrated. The anterior chamber was flushed with Miostat and the eye was inflated to physiologic pressure. 0.91ml Vigamox was placed in the anterior chamber. The wounds were found to be water tight. The eye was dressed with Vigamox. The patient was given protective glasses to wear throughout the day and a shield with which to sleep tonight.  The patient was also given drops with which to begin a drop regimen today and will follow-up with me in one day. Implant Name Type Inv. Item Serial No. Manufacturer Lot No. LRB No. Used  LENS IOL DIOP 25.5 - [id number] Intraocular Lens LENS IOL DIOP 25.5 [id number]  Left 1    Procedure(s) with comments: CATARACT EXTRACTION PHACO AND INTRAOCULAR LENS PLACEMENT (IOC) (Left) - [place] 00:42.3 AP% 16.2 CDE 6.85 Fluid Pack Lot # [id number]  Electronically signed: Birder Robson 10/02/2017 10:24 AM

## 2017-10-02 NOTE — Discharge Instructions (Signed)
Eye Surgery Discharge Instructions  Expect mild scratchy sensation or mild soreness. DO NOT RUB YOUR EYE!  The day of surgery:  Minimal physical activity, but bed rest is not required  No reading, computer work, or close hand work  No bending, lifting, or straining.  May watch TV  For 24 hours:  No driving, legal decisions, or alcoholic beverages  Safety precautions  Eat anything you prefer: It is better to start with liquids, then soup then solid foods.  _____ Eye patch should be worn until postoperative exam tomorrow.  ____ Solar shield eyeglasses should be worn for comfort in the sunlight/patch while sleeping  Resume all regular medications including aspirin or Coumadin if these were discontinued prior to surgery. You may shower, bathe, shave, or wash your hair. Tylenol may be taken for mild discomfort.  Call your doctor if you experience significant pain, nausea, or vomiting, fever > 101 or other signs of infection. [phone] or [phone] Specific instructions:  Follow-up Information    Birder Robson, MD Follow up.   Specialty:  Ophthalmology Why:  March 7 at 1:40pm Contact information: [address] [place] [address] [phone]

## 2017-10-02 NOTE — Anesthesia Preprocedure Evaluation (Signed)
Anesthesia Evaluation  Patient identified by MRN, date of birth, ID band Patient awake    Reviewed: Allergy & Precautions, NPO status , Patient's Chart, lab work & pertinent test results  History of Anesthesia Complications Negative for: history of anesthetic complications  Airway Mallampati: II  TM Distance: >3 FB Neck ROM: Full    Dental no notable dental hx.    Pulmonary neg sleep apnea, neg COPD, former smoker,    breath sounds clear to auscultation- rhonchi (-) wheezing      Cardiovascular hypertension, Pt. on medications (-) angina+ CAD and + Cardiac Stents (~5 yrs ago)  (-) CABG  Rhythm:Regular Rate:Normal - Systolic murmurs and - Diastolic murmurs    Neuro/Psych  Neuromuscular disease negative neurological ROS  negative psych ROS   GI/Hepatic Neg liver ROS, hiatal hernia, GERD  ,  Endo/Other  negative endocrine ROSneg diabetes  Renal/GU negative Renal ROS     Musculoskeletal  (+) Arthritis ,   Abdominal (+) - obese,   Peds  Hematology negative hematology ROS (+)   Anesthesia Other Findings Past Medical History: No date: Allergy No date: Arthritis No date: Coronary artery disease No date: GERD (gastroesophageal reflux disease) No date: History of hiatal hernia No date: Hypercholesteremia No date: Hypertension   Reproductive/Obstetrics                             Anesthesia Physical  Anesthesia Plan  ASA: III  Anesthesia Plan: MAC   Post-op Pain Management:    Induction: Intravenous  PONV Risk Score and Plan: 1 and Midazolam  Airway Management Planned: Natural Airway  Additional Equipment:   Intra-op Plan:   Post-operative Plan:   Informed Consent: I have reviewed the patients History and Physical, chart, labs and discussed the procedure including the risks, benefits and alternatives for the proposed anesthesia with the patient or authorized representative who  has indicated his/her understanding and acceptance.     Plan Discussed with: CRNA and Anesthesiologist  Anesthesia Plan Comments:         Anesthesia Quick Evaluation

## 2017-10-02 NOTE — Anesthesia Postprocedure Evaluation (Signed)
Anesthesia Post Note  Patient: James Peck  Procedure(s) Performed: CATARACT EXTRACTION PHACO AND INTRAOCULAR LENS PLACEMENT (IOC) (Left Eye)  Patient location during evaluation: PACU Anesthesia Type: MAC Level of consciousness: awake and alert and oriented Pain management: pain level controlled Vital Signs Assessment: post-procedure vital signs reviewed and stable Respiratory status: spontaneous breathing Cardiovascular status: blood pressure returned to baseline Anesthetic complications: no     Last Vitals:  Vitals:   10/02/17 1025 10/02/17 1034  BP: (!) 109/57 (!) 100/58  Pulse: 65 66  Resp:    Temp: (!) 36.2 C   SpO2: 100% 100%    Last Pain:  Vitals:   10/02/17 0852  TempSrc: Tympanic                 Aeisha Minarik

## 2017-10-02 NOTE — H&P (Signed)
All labs reviewed. Abnormal studies sent to patients PCP when indicated.  Previous H&P reviewed, patient examined, there are NO CHANGES.  James Minish Porfilio3/6/201910:01 AM

## 2017-10-03 ENCOUNTER — Encounter: Payer: Self-pay | Admitting: Ophthalmology

## 2017-10-10 ENCOUNTER — Encounter: Payer: Self-pay | Admitting: Ophthalmology

## 2017-10-23 NOTE — Progress Notes (Signed)
[name] [address], [place] [address] Phone: [phone] Subjective:     CC: left knee pain   FBP:ZWCHENIDPO  BUNYAN BRIER is a 77 y.o. male coming in with complaint of knee pain follow-up.  Left-sided.  Known to have severe arthritic changes.  Given injection 10 weeks ago.  Feels like it was doing good until approximately 2 days ago.  Patient did move and twisted his knee wrong.  Feels like there is swelling.  Starting to walk with a limp again.        Past Medical History:  Diagnosis Date  . Allergy   . Arthritis   . Coronary artery disease   . GERD (gastroesophageal reflux disease)   . History of hiatal hernia   . Hypercholesteremia   . Hypertension    Past Surgical History:  Procedure Laterality Date  . CARDIAC CATHETERIZATION    . cardiac stents    . CATARACT EXTRACTION W/PHACO Right 09/10/2017   Procedure: CATARACT EXTRACTION PHACO AND INTRAOCULAR LENS PLACEMENT (IOC);  Surgeon: Birder Robson, MD;  Location: [facility] ORS;  Service: Ophthalmology;  Laterality: Right;  [place] 01:01.7 AP% 15.3 CDE 9.38 Fluid pack lot # [id number]  . CATARACT EXTRACTION W/PHACO Left 10/02/2017   Procedure: CATARACT EXTRACTION PHACO AND INTRAOCULAR LENS PLACEMENT (IOC);  Surgeon: Birder Robson, MD;  Location: [facility] ORS;  Service: Ophthalmology;  Laterality: Left;  [place] 00:42.3 AP% 16.2 CDE 6.85 Fluid Pack Lot # [id number]  . COLONOSCOPY WITH PROPOFOL N/A 06/28/2017   Procedure: COLONOSCOPY WITH PROPOFOL;  Surgeon: Manya Silvas, MD;  Location: [facility] ENDOSCOPY;  Service: Endoscopy;  Laterality: N/A;  . CORONARY ANGIOPLASTY     STENTS X 2  . KNEE ARTHROSCOPY     Social History   Socioeconomic History  . Marital status: Married    Spouse name: Not on file  . Number of children: Not on file  . Years of education: Not on file  . Highest education level: Not on file  Occupational History  . Not on file  Social Needs  . Financial resource strain:  Not on file  . Food insecurity:    Worry: Not on file    Inability: Not on file  . Transportation needs:    Medical: Not on file    Non-medical: Not on file  Tobacco Use  . Smoking status: Former Research scientist (life sciences)  . Smokeless tobacco: Never Used  Substance and Sexual Activity  . Alcohol use: No  . Drug use: No  . Sexual activity: Not on file  Lifestyle  . Physical activity:    Days per week: Not on file    Minutes per session: Not on file  . Stress: Not on file  Relationships  . Social connections:    Talks on phone: Not on file    Gets together: Not on file    Attends religious service: Not on file    Active member of club or organization: Not on file    Attends meetings of clubs or organizations: Not on file    Relationship status: Not on file  Other Topics Concern  . Not on file  Social History Narrative  . Not on file   No Known Allergies Family History  Problem Relation Age of Onset  . Heart disease Mother   . Diabetes Mother   . Heart disease Father   . Diabetes Father      Past medical history, social, surgical and family history all reviewed  in electronic medical record.  No pertanent information unless stated regarding to the chief complaint.   Review of Systems:Review of systems updated and as accurate as of 10/24/17  No headache, visual changes, nausea, vomiting, diarrhea, constipation, dizziness, abdominal pain, skin rash, fevers, chills, night sweats, weight loss, swollen lymph nodes, body aches,, chest pain, shortness of breath, mood changes.  Positive muscle aches and joint swelling Objective  Blood pressure 114/62, pulse 72, height 5\' 6"  (1.676 m), weight 168 lb (76.2 kg), SpO2 96 %. Systems examined below as of 10/24/17   General: No apparent distress alert and oriented x3 mood and affect normal, dressed appropriately.  HEENT: Pupils equal, extraocular movements intact  Respiratory: Patient's speak in full sentences and does not appear short of breath    Cardiovascular: No lower extremity edema, non tender, no erythema  Skin: Warm dry intact with no signs of infection or rash on extremities or on axial skeleton.  Abdomen: Soft nontender  Neuro: Cranial nerves II through XII are intact, neurovascularly intact in all extremities with 2+ DTRs and 2+ pulses.  Lymph: No lymphadenopathy of posterior or anterior cervical chain or axillae bilaterally.  Gait mild antalgic MSK:  Non tender with full range of motion and good stability and symmetric strength and tone of shoulders, elbows, wrist, hip  and ankles bilaterally.  Mild arthritic changes Knee:left  valgus deformity noted.  Abnormal thigh to calf ratio.  Tender to palpation over medial and PF joint line.  ROM full in flexion and extension and lower leg rotation. instability with valgus force.  painful patellar compression. Patellar glide with moderate crepitus. Patellar and quadriceps tendons unremarkable. Hamstring and quadriceps strength is normal. Contralateral knee shows mild arthritic changes  After informed written and verbal consent, patient was seated on exam table. Left knee was prepped with alcohol swab and utilizing anterolateral approach, patient's left knee space was injected with 4:1  marcaine 0.5%: Kenalog 40mg /dL. Patient tolerated the procedure well without immediate complications.    Impression and Recommendations:     This case required medical decision making of moderate complexity.      Note: This dictation was prepared with Dragon dictation along with smaller phrase technology. Any transcriptional errors that result from this process are unintentional.

## 2017-10-24 ENCOUNTER — Encounter: Payer: Self-pay | Admitting: Family Medicine

## 2017-10-24 ENCOUNTER — Ambulatory Visit (INDEPENDENT_AMBULATORY_CARE_PROVIDER_SITE_OTHER): Payer: PPO | Admitting: Family Medicine

## 2017-10-24 DIAGNOSIS — M1712 Unilateral primary osteoarthritis, left knee: Secondary | ICD-10-CM

## 2017-10-24 NOTE — Patient Instructions (Signed)
Good to see you  Ice is your friend Bonne Dolores the steroid again  See me again in 4 weeks if you think we want ot try the orthovisc or otherwise 10 weeks for another steroid.

## 2017-10-24 NOTE — Assessment & Plan Note (Signed)
Patient was given an injection.  Tolerated the procedure well.  Discussed icing regimen and home exercises.

## 2017-10-31 DIAGNOSIS — Z961 Presence of intraocular lens: Secondary | ICD-10-CM | POA: Diagnosis not present

## 2017-12-16 ENCOUNTER — Other Ambulatory Visit (INDEPENDENT_AMBULATORY_CARE_PROVIDER_SITE_OTHER): Payer: PPO

## 2017-12-16 DIAGNOSIS — E785 Hyperlipidemia, unspecified: Secondary | ICD-10-CM

## 2017-12-16 DIAGNOSIS — I10 Essential (primary) hypertension: Secondary | ICD-10-CM

## 2017-12-16 DIAGNOSIS — Z125 Encounter for screening for malignant neoplasm of prostate: Secondary | ICD-10-CM

## 2017-12-16 LAB — LIPID PANEL
CHOL/HDL RATIO: 4
Cholesterol: 152 mg/dL (ref 0–200)
HDL: 40.9 mg/dL (ref >39.00)
LDL Cholesterol: 89 mg/dL (ref 0–99)
NONHDL: 110.63
TRIGLYCERIDES: 108 mg/dL (ref 0.0–149.0)
VLDL: 21.6 mg/dL (ref 0.0–40.0)

## 2017-12-16 LAB — TSH: TSH: 1.57 u[IU]/mL (ref 0.35–4.50)

## 2017-12-16 LAB — BASIC METABOLIC PANEL
BUN: 13 mg/dL (ref 6–23)
CALCIUM: 9.4 mg/dL (ref 8.4–10.5)
CO2: 24 mEq/L (ref 19–32)
Chloride: 104 mEq/L (ref 96–112)
Creatinine, Ser: 0.95 mg/dL (ref 0.40–1.50)
GFR: 81.87 mL/min (ref >60.00)
GLUCOSE: 111 mg/dL — AB (ref 70–99)
POTASSIUM: 4.4 meq/L (ref 3.5–5.1)
SODIUM: 138 meq/L (ref 135–145)

## 2017-12-16 LAB — HEPATIC FUNCTION PANEL
ALK PHOS: 50 U/L (ref 39–117)
ALT: 25 U/L (ref 0–53)
AST: 21 U/L (ref 0–37)
Albumin: 4.1 g/dL (ref 3.5–5.2)
BILIRUBIN DIRECT: 0.1 mg/dL (ref 0.0–0.3)
TOTAL PROTEIN: 6.5 g/dL (ref 6.0–8.3)
Total Bilirubin: 0.7 mg/dL (ref 0.2–1.2)

## 2017-12-16 LAB — PSA, MEDICARE: PSA: 1.39 ng/ml (ref 0.10–4.00)

## 2017-12-17 ENCOUNTER — Other Ambulatory Visit: Payer: Self-pay | Admitting: Internal Medicine

## 2017-12-17 DIAGNOSIS — R739 Hyperglycemia, unspecified: Secondary | ICD-10-CM

## 2017-12-17 LAB — VARICELLA ZOSTER ANTIBODY, IGG: Varicella IgG: 3335 index

## 2017-12-17 NOTE — Progress Notes (Signed)
Orders placed for f/u labs.  

## 2017-12-18 ENCOUNTER — Encounter: Payer: Self-pay | Admitting: Internal Medicine

## 2017-12-19 ENCOUNTER — Ambulatory Visit (INDEPENDENT_AMBULATORY_CARE_PROVIDER_SITE_OTHER): Payer: PPO | Admitting: Internal Medicine

## 2017-12-19 ENCOUNTER — Encounter: Payer: Self-pay | Admitting: Internal Medicine

## 2017-12-19 DIAGNOSIS — K219 Gastro-esophageal reflux disease without esophagitis: Secondary | ICD-10-CM

## 2017-12-19 DIAGNOSIS — I2510 Atherosclerotic heart disease of native coronary artery without angina pectoris: Secondary | ICD-10-CM | POA: Diagnosis not present

## 2017-12-19 DIAGNOSIS — R739 Hyperglycemia, unspecified: Secondary | ICD-10-CM | POA: Diagnosis not present

## 2017-12-19 DIAGNOSIS — E785 Hyperlipidemia, unspecified: Secondary | ICD-10-CM | POA: Diagnosis not present

## 2017-12-19 DIAGNOSIS — I10 Essential (primary) hypertension: Secondary | ICD-10-CM | POA: Diagnosis not present

## 2017-12-19 MED ORDER — RAMIPRIL 5 MG PO CAPS: 5.0000 mg | ORAL_CAPSULE | Freq: Every day | ORAL | 1 refills | Status: DC

## 2017-12-19 MED ORDER — FLUTICASONE PROPIONATE 50 MCG/ACT NA SUSP: 2.0000 | Freq: Every day | NASAL | 1 refills | Status: DC

## 2017-12-19 MED ORDER — OMEPRAZOLE 20 MG PO CPDR: 20.0000 mg | DELAYED_RELEASE_CAPSULE | Freq: Every day | ORAL | 1 refills | Status: DC

## 2017-12-19 MED ORDER — AMLODIPINE BESYLATE 5 MG PO TABS: 5.0000 mg | ORAL_TABLET | Freq: Every day | ORAL | 1 refills | Status: DC

## 2017-12-19 MED ORDER — CETIRIZINE HCL 10 MG PO TABS: 10.0000 mg | ORAL_TABLET | Freq: Every day | ORAL | 1 refills | Status: DC

## 2017-12-19 MED ORDER — SIMVASTATIN 40 MG PO TABS: 40.0000 mg | ORAL_TABLET | Freq: Every day | ORAL | 1 refills | Status: DC

## 2017-12-19 NOTE — Progress Notes (Signed)
Patient ID: James Peck, male   DOB: 1941/07/06, 77 y.o.   MRN: [id number]   Subjective:    Patient ID: James Peck, male    DOB: 09-25-1940, 77 y.o.   MRN: [id number]  HPI  Patient here for a scheduled follow up.  Saw Dr Tamala Julian for his knee pain.  S/p injection.  Continues f/u.  Tries to stay active.  No chest pain.  No sob.  No acid reflux.  No abdominal pain.  Bowels moving.  Increased stress.  His brother was recently admitted with a stroke.  Discussed with him today.  Has good support.  Overall feels he is handling things relatively well.     Past Medical History:  Diagnosis Date  . Allergy   . Arthritis   . Coronary artery disease   . GERD (gastroesophageal reflux disease)   . History of hiatal hernia   . Hypercholesteremia   . Hypertension    Past Surgical History:  Procedure Laterality Date  . CARDIAC CATHETERIZATION    . cardiac stents    . CATARACT EXTRACTION W/PHACO Right 09/10/2017   Procedure: CATARACT EXTRACTION PHACO AND INTRAOCULAR LENS PLACEMENT (IOC);  Surgeon: Birder Robson, MD;  Location: [facility] ORS;  Service: Ophthalmology;  Laterality: Right;  [place] 01:01.7 AP% 15.3 CDE 9.38 Fluid pack lot # [id number]  . CATARACT EXTRACTION W/PHACO Left 10/02/2017   Procedure: CATARACT EXTRACTION PHACO AND INTRAOCULAR LENS PLACEMENT (IOC);  Surgeon: Birder Robson, MD;  Location: [facility] ORS;  Service: Ophthalmology;  Laterality: Left;  [place] 00:42.3 AP% 16.2 CDE 6.85 Fluid Pack Lot # [id number]  . COLONOSCOPY WITH PROPOFOL N/A 06/28/2017   Procedure: COLONOSCOPY WITH PROPOFOL;  Surgeon: Manya Silvas, MD;  Location: [facility] ENDOSCOPY;  Service: Endoscopy;  Laterality: N/A;  . CORONARY ANGIOPLASTY     STENTS X 2  . KNEE ARTHROSCOPY     Family History  Problem Relation Age of Onset  . Heart disease Mother   . Diabetes Mother   . Heart disease Father   . Diabetes Father    Social History   Socioeconomic History  . Marital status: Married    Spouse name: Not on file  .  Number of children: Not on file  . Years of education: Not on file  . Highest education level: Not on file  Occupational History  . Not on file  Social Needs  . Financial resource strain: Not on file  . Food insecurity:    Worry: Not on file    Inability: Not on file  . Transportation needs:    Medical: Not on file    Non-medical: Not on file  Tobacco Use  . Smoking status: Former Research scientist (life sciences)  . Smokeless tobacco: Never Used  Substance and Sexual Activity  . Alcohol use: No  . Drug use: No  . Sexual activity: Not on file  Lifestyle  . Physical activity:    Days per week: Not on file    Minutes per session: Not on file  . Stress: Not on file  Relationships  . Social connections:    Talks on phone: Not on file    Gets together: Not on file    Attends religious service: Not on file    Active member of club or organization: Not on file    Attends meetings of clubs or organizations: Not on file    Relationship status: Not on file  Other Topics Concern  . Not on file  Social History Narrative  . Not on  file    Outpatient Encounter Medications as of 12/19/2017  Medication Sig  . amLODipine (NORVASC) 5 MG tablet Take 1 tablet (5 mg total) by mouth daily.  Marland Kitchen aspirin EC 81 MG tablet Take 81 mg by mouth daily.   . calcium carbonate (OS-CAL - DOSED IN MG OF ELEMENTAL CALCIUM) 1250 (500 CA) MG tablet Take 1 tablet by mouth daily.   . cetirizine (ZYRTEC) 10 MG tablet Take 1 tablet (10 mg total) by mouth daily.  . diclofenac sodium (VOLTAREN) 1 % GEL Apply 2 g 2 (two) times daily topically. To affected joint.  . fluticasone (FLONASE) 50 MCG/ACT nasal spray Place 2 sprays into both nostrils daily.  Marland Kitchen GLUCOSAMINE-CHONDROITIN PO Take 1 capsule by mouth daily.  . Misc Natural Products (BLACK CHERRY CONCENTRATE PO) Take 1,000 mg daily by mouth.  . Omega-3 Fatty Acids (FISH OIL) 1000 MG CAPS Take 1,000 mg by mouth daily.   Marland Kitchen omeprazole (PRILOSEC) 20 MG capsule Take 1 capsule (20 mg total) by  mouth daily.  . ramipril (ALTACE) 5 MG capsule Take 1 capsule (5 mg total) by mouth daily.  . simvastatin (ZOCOR) 40 MG tablet Take 1 tablet (40 mg total) by mouth daily.  . tadalafil (CIALIS) 5 MG tablet Take 5 mg daily as needed by mouth for erectile dysfunction.  . [DISCONTINUED] amLODipine (NORVASC) 10 MG tablet Take 10 mg by mouth daily.   . [DISCONTINUED] amLODipine (NORVASC) 5 MG tablet   . [DISCONTINUED] cetirizine (ZYRTEC) 10 MG tablet Take 10 mg by mouth daily.   . [DISCONTINUED] fluticasone (FLONASE) 50 MCG/ACT nasal spray Place 2 sprays into both nostrils daily. (Patient taking differently: Place 2 sprays into both nostrils at bedtime. )  . [DISCONTINUED] omeprazole (PRILOSEC) 20 MG capsule Take 20 mg by mouth daily.   . [DISCONTINUED] ramipril (ALTACE) 5 MG capsule Take 5 mg by mouth daily.   . [DISCONTINUED] simvastatin (ZOCOR) 40 MG tablet Take 40 mg by mouth daily.   . [DISCONTINUED] Vitamin D, Ergocalciferol, (DRISDOL) 50000 units CAPS capsule Take 1 capsule (50,000 Units total) every 7 (seven) days by mouth.   No facility-administered encounter medications on file as of 12/19/2017.     Review of Systems  Constitutional: Negative for appetite change and unexpected weight change.  HENT: Negative for congestion and sinus pressure.   Respiratory: Negative for cough, chest tightness and shortness of breath.   Cardiovascular: Negative for chest pain, palpitations and leg swelling.  Gastrointestinal: Negative for abdominal pain, diarrhea, nausea and vomiting.  Genitourinary: Negative for difficulty urinating and dysuria.  Musculoskeletal: Negative for joint swelling and myalgias.  Skin: Negative for color change and rash.  Neurological: Negative for dizziness, light-headedness and headaches.  Psychiatric/Behavioral: Negative for agitation and dysphoric mood.       Objective:     Blood pressure rechecked by me:  124/70  Physical Exam  Constitutional: He appears  well-developed and well-nourished. No distress.  HENT:  Nose: Nose normal.  Mouth/Throat: Oropharynx is clear and moist.  Neck: Neck supple. No thyromegaly present.  Cardiovascular: Normal rate and regular rhythm.  Pulmonary/Chest: Effort normal and breath sounds normal. No respiratory distress.  Abdominal: Soft. Bowel sounds are normal. There is no tenderness.  Musculoskeletal: He exhibits no edema or tenderness.  Lymphadenopathy:    He has no cervical adenopathy.  Skin: No rash noted. No erythema.  Psychiatric: He has a normal mood and affect. His behavior is normal.    BP 120/76 (BP Location: Left Arm, Patient Position: Sitting,  Cuff Size: Normal)   Pulse 70   Temp 97.8 F (36.6 C) (Oral)   Resp 18   Wt 165 lb 3.2 oz (74.9 kg)   SpO2 97%   BMI 26.66 kg/m  Wt Readings from Last 3 Encounters:  12/19/17 165 lb 3.2 oz (74.9 kg)  10/24/17 168 lb (76.2 kg)  10/02/17 165 lb (74.8 kg)     Lab Results  Component Value Date   GLUCOSE 111 (H) 12/16/2017   CHOL 152 12/16/2017   TRIG 108.0 12/16/2017   HDL 40.90 12/16/2017   LDLCALC 89 12/16/2017   ALT 25 12/16/2017   AST 21 12/16/2017   NA 138 12/16/2017   K 4.4 12/16/2017   CL 104 12/16/2017   CREATININE 0.95 12/16/2017   BUN 13 12/16/2017   CO2 24 12/16/2017   TSH 1.57 12/16/2017   PSA 1.39 12/16/2017       Assessment & Plan:   Problem List Items Addressed This Visit    Acid reflux    Controlled on current regimen.  Follow.       Relevant Medications   omeprazole (PRILOSEC) 20 MG capsule   Arteriosclerosis of coronary artery    Has known CAD.  Doing well.  Followed by Dr Ubaldo Glassing.  Continue risk factor modification.        Relevant Medications   amLODipine (NORVASC) 5 MG tablet   simvastatin (ZOCOR) 40 MG tablet   ramipril (ALTACE) 5 MG capsule   Benign essential HTN    Blood pressure under good control.  Continue same medication regimen.  Follow pressures.  Follow metabolic panel.        Relevant  Medications   amLODipine (NORVASC) 5 MG tablet   simvastatin (ZOCOR) 40 MG tablet   ramipril (ALTACE) 5 MG capsule   HLD (hyperlipidemia)    On simvastatin.  Low cholesterol diet and exercise.  Follow lipid panel and liver function tests.        Relevant Medications   amLODipine (NORVASC) 5 MG tablet   simvastatin (ZOCOR) 40 MG tablet   ramipril (ALTACE) 5 MG capsule   Hyperglycemia    Discussed lab results.  Sugar elevated. Recheck fasting glucose and a1c.  Low carb diet.  Follow.           Einar Pheasant, MD

## 2017-12-22 ENCOUNTER — Encounter: Payer: Self-pay | Admitting: Internal Medicine

## 2017-12-22 DIAGNOSIS — R739 Hyperglycemia, unspecified: Secondary | ICD-10-CM | POA: Insufficient documentation

## 2017-12-22 NOTE — Assessment & Plan Note (Signed)
Blood pressure under good control.  Continue same medication regimen.  Follow pressures.  Follow metabolic panel.   

## 2017-12-22 NOTE — Assessment & Plan Note (Signed)
Has known CAD.  Doing well.  Followed by Dr Ubaldo Glassing.  Continue risk factor modification.

## 2017-12-22 NOTE — Assessment & Plan Note (Signed)
On simvastatin.  Low cholesterol diet and exercise.  Follow lipid panel and liver function tests.   

## 2017-12-22 NOTE — Assessment & Plan Note (Signed)
Discussed lab results.  Sugar elevated. Recheck fasting glucose and a1c.  Low carb diet.  Follow.

## 2017-12-22 NOTE — Assessment & Plan Note (Signed)
Controlled on current regimen.  Follow.  

## 2018-01-03 ENCOUNTER — Ambulatory Visit: Payer: PPO | Admitting: Family Medicine

## 2018-01-06 ENCOUNTER — Encounter: Payer: Self-pay | Admitting: Internal Medicine

## 2018-01-06 ENCOUNTER — Ambulatory Visit (INDEPENDENT_AMBULATORY_CARE_PROVIDER_SITE_OTHER): Payer: PPO

## 2018-01-06 ENCOUNTER — Ambulatory Visit (INDEPENDENT_AMBULATORY_CARE_PROVIDER_SITE_OTHER): Payer: PPO | Admitting: Internal Medicine

## 2018-01-06 VITALS — BP 128/60 | HR 82 | Temp 98.2°F | Ht 66.0 in | Wt 166.0 lb

## 2018-01-06 DIAGNOSIS — J329 Chronic sinusitis, unspecified: Secondary | ICD-10-CM | POA: Diagnosis not present

## 2018-01-06 DIAGNOSIS — R05 Cough: Secondary | ICD-10-CM

## 2018-01-06 DIAGNOSIS — R0989 Other specified symptoms and signs involving the circulatory and respiratory systems: Secondary | ICD-10-CM | POA: Diagnosis not present

## 2018-01-06 DIAGNOSIS — R062 Wheezing: Secondary | ICD-10-CM

## 2018-01-06 DIAGNOSIS — R059 Cough, unspecified: Secondary | ICD-10-CM

## 2018-01-06 MED ORDER — DOXYCYCLINE HYCLATE 100 MG PO TABS: 100.0000 mg | ORAL_TABLET | Freq: Two times a day (BID) | ORAL | 0 refills | Status: DC

## 2018-01-06 MED ORDER — ALBUTEROL SULFATE 108 (90 BASE) MCG/ACT IN AEPB: 1.0000 | INHALATION_SPRAY | Freq: Four times a day (QID) | RESPIRATORY_TRACT | 0 refills | Status: DC | PRN

## 2018-01-06 MED ORDER — METHYLPREDNISOLONE ACETATE 40 MG/ML IJ SUSP
40.0000 mg | Freq: Once | INTRAMUSCULAR | Status: AC
  Administered 2018-01-06: 40 mg via INTRAMUSCULAR

## 2018-01-06 MED ORDER — IPRATROPIUM-ALBUTEROL 0.5-2.5 (3) MG/3ML IN SOLN
3.0000 mL | Freq: Once | RESPIRATORY_TRACT | Status: AC
  Administered 2018-01-06: 3 mL via RESPIRATORY_TRACT

## 2018-01-06 MED ORDER — HYDROCOD POLST-CPM POLST ER 10-8 MG/5ML PO SUER: 5.0000 mL | Freq: Every evening | ORAL | 0 refills | Status: DC | PRN

## 2018-01-06 MED ORDER — ALBUTEROL SULFATE (2.5 MG/3ML) 0.083% IN NEBU
2.5000 mg | INHALATION_SOLUTION | Freq: Once | RESPIRATORY_TRACT | Status: AC
  Administered 2018-01-06: 2.5 mg via RESPIRATORY_TRACT

## 2018-01-06 NOTE — Progress Notes (Signed)
Chief Complaint  Patient presents with  . URI  . Cough  . Sinusitis   Sick visit  Last Thursday mowed the yard and then had clear phlegm, cough, sinus congestion, pnd tried robitussin only not helping. He has been wheezing and felt sob. He does not have asthma. He is former smoker. CXR 12/28/16 pt was never told about result and unable to view in care everywhere.     Review of Systems  Constitutional: Negative for weight loss.  HENT: Positive for sinus pain.   Respiratory: Positive for cough, sputum production, shortness of breath and wheezing.   Cardiovascular: Negative for chest pain.  Skin: Negative for rash.   Past Medical History:  Diagnosis Date  . Allergy   . Arthritis   . Coronary artery disease   . GERD (gastroesophageal reflux disease)   . History of hiatal hernia   . Hypercholesteremia   . Hypertension    Past Surgical History:  Procedure Laterality Date  . CARDIAC CATHETERIZATION    . cardiac stents    . CATARACT EXTRACTION W/PHACO Right 09/10/2017   Procedure: CATARACT EXTRACTION PHACO AND INTRAOCULAR LENS PLACEMENT (IOC);  Surgeon: Birder Robson, MD;  Location: [facility] ORS;  Service: Ophthalmology;  Laterality: Right;  [place] 01:01.7 AP% 15.3 CDE 9.38 Fluid pack lot # [id number]  . CATARACT EXTRACTION W/PHACO Left 10/02/2017   Procedure: CATARACT EXTRACTION PHACO AND INTRAOCULAR LENS PLACEMENT (IOC);  Surgeon: Birder Robson, MD;  Location: [facility] ORS;  Service: Ophthalmology;  Laterality: Left;  [place] 00:42.3 AP% 16.2 CDE 6.85 Fluid Pack Lot # [id number]  . COLONOSCOPY WITH PROPOFOL N/A 06/28/2017   Procedure: COLONOSCOPY WITH PROPOFOL;  Surgeon: Manya Silvas, MD;  Location: [facility] ENDOSCOPY;  Service: Endoscopy;  Laterality: N/A;  . CORONARY ANGIOPLASTY     STENTS X 2  . KNEE ARTHROSCOPY     Family History  Problem Relation Age of Onset  . Heart disease Mother   . Diabetes Mother   . Heart disease Father   . Diabetes Father    Social History    Socioeconomic History  . Marital status: Married    Spouse name: Not on file  . Number of children: Not on file  . Years of education: Not on file  . Highest education level: Not on file  Occupational History  . Not on file  Social Needs  . Financial resource strain: Not on file  . Food insecurity:    Worry: Not on file    Inability: Not on file  . Transportation needs:    Medical: Not on file    Non-medical: Not on file  Tobacco Use  . Smoking status: Former Research scientist (life sciences)  . Smokeless tobacco: Never Used  Substance and Sexual Activity  . Alcohol use: No  . Drug use: No  . Sexual activity: Not on file  Lifestyle  . Physical activity:    Days per week: Not on file    Minutes per session: Not on file  . Stress: Not on file  Relationships  . Social connections:    Talks on phone: Not on file    Gets together: Not on file    Attends religious service: Not on file    Active member of club or organization: Not on file    Attends meetings of clubs or organizations: Not on file    Relationship status: Not on file  . Intimate partner violence:    Fear of current or ex partner: Not on file    Emotionally  abused: Not on file    Physically abused: Not on file    Forced sexual activity: Not on file  Other Topics Concern  . Not on file  Social History Narrative  . Not on file   Current Meds  Medication Sig  . amLODipine (NORVASC) 5 MG tablet Take 1 tablet (5 mg total) by mouth daily.  Marland Kitchen aspirin EC 81 MG tablet Take 81 mg by mouth daily.   . calcium carbonate (OS-CAL - DOSED IN MG OF ELEMENTAL CALCIUM) 1250 (500 CA) MG tablet Take 1 tablet by mouth daily.   . cetirizine (ZYRTEC) 10 MG tablet Take 1 tablet (10 mg total) by mouth daily.  . diclofenac sodium (VOLTAREN) 1 % GEL Apply 2 g 2 (two) times daily topically. To affected joint.  . fluticasone (FLONASE) 50 MCG/ACT nasal spray Place 2 sprays into both nostrils daily.  Marland Kitchen GLUCOSAMINE-CHONDROITIN PO Take 1 capsule by mouth daily.   . Misc Natural Products (BLACK CHERRY CONCENTRATE PO) Take 1,000 mg daily by mouth.  . Omega-3 Fatty Acids (FISH OIL) 1000 MG CAPS Take 1,000 mg by mouth daily.   Marland Kitchen omeprazole (PRILOSEC) 20 MG capsule Take 1 capsule (20 mg total) by mouth daily.  . ramipril (ALTACE) 5 MG capsule Take 1 capsule (5 mg total) by mouth daily.  . simvastatin (ZOCOR) 40 MG tablet Take 1 tablet (40 mg total) by mouth daily.  . tadalafil (CIALIS) 5 MG tablet Take 5 mg daily as needed by mouth for erectile dysfunction.   No Known Allergies Recent Results (from the past 2160 hour(s))  PSA, Medicare     Status: None   Collection Time: 12/16/17  8:03 AM  Result Value Ref Range   PSA 1.39 0.10 - 4.00 ng/ml    Comment: Test performed using Access Hybritech PSA Assay, a parmagnetic partical, chemiluminecent immunoassay.  Basic metabolic panel     Status: Abnormal   Collection Time: 12/16/17  8:03 AM  Result Value Ref Range   Sodium 138 135 - 145 mEq/L   Potassium 4.4 3.5 - 5.1 mEq/L   Chloride 104 96 - 112 mEq/L   CO2 24 19 - 32 mEq/L   Glucose, Bld 111 (H) 70 - 99 mg/dL   BUN 13 6 - 23 mg/dL   Creatinine, Ser 0.95 0.40 - 1.50 mg/dL   Calcium 9.4 8.4 - 10.5 mg/dL   GFR 81.87 >60.00 mL/min  TSH     Status: None   Collection Time: 12/16/17  8:03 AM  Result Value Ref Range   TSH 1.57 0.35 - 4.50 uIU/mL  Lipid panel     Status: None   Collection Time: 12/16/17  8:03 AM  Result Value Ref Range   Cholesterol 152 0 - 200 mg/dL    Comment: ATP III Classification       Desirable:  < 200 mg/dL               Borderline High:  200 - 239 mg/dL          High:  > = 240 mg/dL   Triglycerides 108.0 0.0 - 149.0 mg/dL    Comment: Normal:  <150 mg/dLBorderline High:  150 - 199 mg/dL   HDL 40.90 >39.00 mg/dL   VLDL 21.6 0.0 - 40.0 mg/dL   LDL Cholesterol 89 0 - 99 mg/dL   Total CHOL/HDL Ratio 4     Comment:                Men  Women1/2 Average Risk     3.4          3.3Average Risk          5.0          4.42X Average  Risk          9.6          7.13X Average Risk          15.0          11.0                       NonHDL 110.63     Comment: NOTE:  Non-HDL goal should be 30 mg/dL higher than patient's LDL goal (i.e. LDL goal of < 70 mg/dL, would have non-HDL goal of < 100 mg/dL)  Hepatic function panel     Status: None   Collection Time: 12/16/17  8:03 AM  Result Value Ref Range   Total Bilirubin 0.7 0.2 - 1.2 mg/dL   Bilirubin, Direct 0.1 0.0 - 0.3 mg/dL   Alkaline Phosphatase 50 39 - 117 U/L   AST 21 0 - 37 U/L   ALT 25 0 - 53 U/L   Total Protein 6.5 6.0 - 8.3 g/dL   Albumin 4.1 3.5 - 5.2 g/dL  Varicella zoster antibody, IgG     Status: None   Collection Time: 12/16/17  8:03 AM  Result Value Ref Range   Varicella IgG 3,335.00 index    Comment:        Index               Interpretation      ---------         ----------------------     <135.00            Negative - Antibody not detected     135.00 - 164.99    Equivocal     > or = 165.00      Positive - Antibody detected .     A positive result indicates that the patient     has antibody to VZV but does not differentiate     between an active or past infection.      The clinical diagnosis must be interpreted in      conjunction with the clinical signs and symptoms of      the patient. This assay reliably measures immunity     due to previous infection but may not be      sensitive enough to detect antibodies induced by     vaccination. Thus, a negative result in a vaccinated     individual does not necessarily indicate     susceptibility to VZV infection.    Objective  Body mass index is 26.79 kg/m. Wt Readings from Last 3 Encounters:  01/06/18 166 lb (75.3 kg)  12/19/17 165 lb 3.2 oz (74.9 kg)  10/24/17 168 lb (76.2 kg)   Temp Readings from Last 3 Encounters:  01/06/18 98.2 F (36.8 C) (Oral)  12/19/17 97.8 F (36.6 C) (Oral)  10/02/17 (!) 97.2 F (36.2 C)   BP Readings from Last 3 Encounters:  01/06/18 128/60  12/19/17 120/76   10/24/17 114/62   Pulse Readings from Last 3 Encounters:  01/06/18 82  12/19/17 70  10/24/17 72    Physical Exam  Constitutional: He is oriented to person, place, and time. Vital signs are normal. He appears well-developed and well-nourished. He is cooperative.  HENT:  Head: Normocephalic and atraumatic.  Mouth/Throat: Oropharynx is clear and moist and mucous membranes are normal.  Eyes: Pupils are equal, round, and reactive to light. Conjunctivae are normal.  Cardiovascular: Normal rate, regular rhythm and normal heart sounds.  Pulmonary/Chest: Effort normal. He has wheezes.  Mild wheezing b/l   Neurological: He is alert and oriented to person, place, and time. Gait normal.  Skin: Skin is warm, dry and intact.  Psychiatric: He has a normal mood and affect. His speech is normal and behavior is normal. Judgment and thought content normal. Cognition and memory are normal.  Vitals reviewed.   Assessment   1. Sinusitis with cough ddx (URI vs COPD exacerbation  mild in former smoker vs allergic cough) Plan   1. duoneb x 1, tussionex qhs prn, prn Albuterol inhaler, doxy bid x 1 week  Depo 40 x 1  cxr today F/u in 7-10 days if not better  Cont robitussin prn Cont flonase and zyrtec qhs   Provider: Dr. Olivia Mackie McLean-Scocuzza-[facility]

## 2018-01-06 NOTE — Progress Notes (Signed)
Pre visit review using our clinic review tool, if applicable. No additional management support is needed unless otherwise documented below in the visit note. 

## 2018-01-06 NOTE — Patient Instructions (Signed)
Cough, Adult  Coughing is a reflex that clears your throat and your airways. Coughing helps to heal and protect your lungs. It is normal to cough occasionally, but a cough that happens with other symptoms or lasts a long time may be a sign of a condition that needs treatment. A cough may last only 2-3 weeks (acute), or it may last longer than 8 weeks (chronic).  What are the causes?  Coughing is commonly caused by:   Breathing in substances that irritate your lungs.   A viral or bacterial respiratory infection.   Allergies.   Asthma.   Postnasal drip.   Smoking.   Acid backing up from the stomach into the esophagus (gastroesophageal reflux).   Certain medicines.   Chronic lung problems, including COPD (or rarely, lung cancer).   Other medical conditions such as heart failure.    Follow these instructions at home:  Pay attention to any changes in your symptoms. Take these actions to help with your discomfort:   Take medicines only as told by your health care provider.  ? If you were prescribed an antibiotic medicine, take it as told by your health care provider. Do not stop taking the antibiotic even if you start to feel better.  ? Talk with your health care provider before you take a cough suppressant medicine.   Drink enough fluid to keep your urine clear or pale yellow.   If the air is dry, use a cold steam vaporizer or humidifier in your bedroom or your home to help loosen secretions.   Avoid anything that causes you to cough at work or at home.   If your cough is worse at night, try sleeping in a semi-upright position.   Avoid cigarette smoke. If you smoke, quit smoking. If you need help quitting, ask your health care provider.   Avoid caffeine.   Avoid alcohol.   Rest as needed.    Contact a health care provider if:   You have new symptoms.   You cough up pus.   Your cough does not get better after 2-3 weeks, or your cough gets worse.   You cannot control your cough with suppressant  medicines and you are losing sleep.   You develop pain that is getting worse or pain that is not controlled with pain medicines.   You have a fever.   You have unexplained weight loss.   You have night sweats.  Get help right away if:   You cough up blood.   You have difficulty breathing.   Your heartbeat is very fast.  This information is not intended to replace advice given to you by your health care provider. Make sure you discuss any questions you have with your health care provider.  Document Released: 01/12/2011 Document Revised: 12/22/2015 Document Reviewed: 09/22/2014  Elsevier Interactive Patient Education  2018 [employer]

## 2018-01-09 ENCOUNTER — Other Ambulatory Visit (INDEPENDENT_AMBULATORY_CARE_PROVIDER_SITE_OTHER): Payer: PPO

## 2018-01-09 DIAGNOSIS — R739 Hyperglycemia, unspecified: Secondary | ICD-10-CM | POA: Diagnosis not present

## 2018-01-09 NOTE — Addendum Note (Signed)
Addended by: Arby Barrette on: 01/09/2018 08:01 AM   Modules accepted: Orders

## 2018-01-10 ENCOUNTER — Encounter: Payer: Self-pay | Admitting: Internal Medicine

## 2018-01-15 LAB — TIQ-NTM

## 2018-01-15 LAB — HEMOGLOBIN A1C
EAG (MMOL/L): 6.6 (calc)
HEMOGLOBIN A1C: 5.8 %{Hb} — AB (ref <5.7)
MEAN PLASMA GLUCOSE: 120 (calc)

## 2018-01-15 LAB — GLUCOSE, FASTING

## 2018-03-10 NOTE — Progress Notes (Signed)
[name] [address], [place] [address] Phone: [phone] Subjective:     CC: Left knee pain  JSE:[id number]  James Peck is a 77 y.o. male coming in with complaint of knee pain. States that his knee is in pain today. Wants an injection.  Last injection 5 months ago.  Was doing much better in the last month worsening pain again.  Started to have increasing instability.  States that it hurts with daily activities and sometimes a significant soreness at night.     Past Medical History:  Diagnosis Date  . Allergy   . Arthritis   . Coronary artery disease   . GERD (gastroesophageal reflux disease)   . History of hiatal hernia   . Hypercholesteremia   . Hypertension    Past Surgical History:  Procedure Laterality Date  . CARDIAC CATHETERIZATION    . cardiac stents    . CATARACT EXTRACTION W/PHACO Right 09/10/2017   Procedure: CATARACT EXTRACTION PHACO AND INTRAOCULAR LENS PLACEMENT (IOC);  Surgeon: Birder Robson, MD;  Location: [facility] ORS;  Service: Ophthalmology;  Laterality: Right;  [place] 01:01.7 AP% 15.3 CDE 9.38 Fluid pack lot # [id number]  . CATARACT EXTRACTION W/PHACO Left 10/02/2017   Procedure: CATARACT EXTRACTION PHACO AND INTRAOCULAR LENS PLACEMENT (IOC);  Surgeon: Birder Robson, MD;  Location: [facility] ORS;  Service: Ophthalmology;  Laterality: Left;  [place] 00:42.3 AP% 16.2 CDE 6.85 Fluid Pack Lot # [id number]  . COLONOSCOPY WITH PROPOFOL N/A 06/28/2017   Procedure: COLONOSCOPY WITH PROPOFOL;  Surgeon: Manya Silvas, MD;  Location: [facility] ENDOSCOPY;  Service: Endoscopy;  Laterality: N/A;  . CORONARY ANGIOPLASTY     STENTS X 2  . KNEE ARTHROSCOPY     Social History   Socioeconomic History  . Marital status: Married    Spouse name: Not on file  . Number of children: Not on file  . Years of education: Not on file  . Highest education level: Not on file  Occupational History  . Not on file  Social Needs  . Financial resource  strain: Not on file  . Food insecurity:    Worry: Not on file    Inability: Not on file  . Transportation needs:    Medical: Not on file    Non-medical: Not on file  Tobacco Use  . Smoking status: Former Research scientist (life sciences)  . Smokeless tobacco: Never Used  Substance and Sexual Activity  . Alcohol use: No  . Drug use: No  . Sexual activity: Not on file  Lifestyle  . Physical activity:    Days per week: Not on file    Minutes per session: Not on file  . Stress: Not on file  Relationships  . Social connections:    Talks on phone: Not on file    Gets together: Not on file    Attends religious service: Not on file    Active member of club or organization: Not on file    Attends meetings of clubs or organizations: Not on file    Relationship status: Not on file  Other Topics Concern  . Not on file  Social History Narrative  . Not on file   No Known Allergies Family History  Problem Relation Age of Onset  . Heart disease Mother   . Diabetes Mother   . Heart disease Father   . Diabetes Father      Past medical history, social, surgical and family history all reviewed in electronic medical record.  No pertanent information unless stated regarding to the chief complaint.   Review of Systems:Review of systems updated and as accurate as of 03/11/18  No headache, visual changes, nausea, vomiting, diarrhea, constipation, dizziness, abdominal pain, skin rash, fevers, chills, night sweats, weight loss, swollen lymph nodes, body aches, , chest pain, shortness of breath, mood changes.  Positive muscle aches and joint swelling  Objective  Blood pressure 130/82, pulse 66, height 5\' 6"  (1.676 m), weight 166 lb (75.3 kg), SpO2 96 %. Systems examined below as of 03/11/18   General: No apparent distress alert and oriented x3 mood and affect normal, dressed appropriately.  HEENT: Pupils equal, extraocular movements intact  Respiratory: Patient's speak in full sentences and does not appear short of  breath  Cardiovascular: No lower extremity edema, non tender, no erythema  Skin: Warm dry intact with no signs of infection or rash on extremities or on axial skeleton.  Abdomen: Soft nontender  Neuro: Cranial nerves II through XII are intact, neurovascularly intact in all extremities with 2+ DTRs and 2+ pulses.  Lymph: No lymphadenopathy of posterior or anterior cervical chain or axillae bilaterally.  Gait mild antalgic MSK:  Non tender with full range of motion and good stability and symmetric strength and tone of shoulders, elbows, wrist, hip, and ankles bilaterally.  Knee: Left valgus deformity noted.  Abnormal thigh to calf ratio.  Tender to palpation over medial and PF joint line.  ROM full in flexion and extension and lower leg rotation. instability with valgus force.  painful patellar compression. Patellar glide with moderate crepitus. Patellar and quadriceps tendons unremarkable. Hamstring and quadriceps strength is normal. Contralateral knee shows mild arthritic changes as well but no significant instability  After informed written and verbal consent, patient was seated on exam table. Left knee was prepped with alcohol swab and utilizing anterolateral approach, patient's left knee space was injected with 4:1  marcaine 0.5%: Kenalog 40mg /dL. Patient tolerated the procedure well without immediate complications.    Impression and Recommendations:     This case required medical decision making of moderate complexity.      Note: This dictation was prepared with Dragon dictation along with smaller phrase technology. Any transcriptional errors that result from this process are unintentional.

## 2018-03-11 ENCOUNTER — Ambulatory Visit (INDEPENDENT_AMBULATORY_CARE_PROVIDER_SITE_OTHER): Payer: PPO | Admitting: Family Medicine

## 2018-03-11 ENCOUNTER — Encounter: Payer: Self-pay | Admitting: Family Medicine

## 2018-03-11 DIAGNOSIS — M1712 Unilateral primary osteoarthritis, left knee: Secondary | ICD-10-CM | POA: Diagnosis not present

## 2018-03-11 NOTE — Patient Instructions (Signed)
Been 5 months.  Repeated the injection again today  I hope it helps Make an appointment in 10 weeks just in case and then if doing well see me when you need me

## 2018-03-11 NOTE — Assessment & Plan Note (Signed)
Patient given another injection today.  Has tolerated these pretty well.  Discussed icing regimen and home exercises.  Discussed which activities to do which wants to avoid.  Patient will increase activity as tolerated.  Patient would be a candidate for Visco supplementation if needed.  Follow-up again in 10 weeks

## 2018-04-24 ENCOUNTER — Ambulatory Visit (INDEPENDENT_AMBULATORY_CARE_PROVIDER_SITE_OTHER): Payer: PPO | Admitting: Internal Medicine

## 2018-04-24 ENCOUNTER — Encounter: Payer: Self-pay | Admitting: Internal Medicine

## 2018-04-24 DIAGNOSIS — Z23 Encounter for immunization: Secondary | ICD-10-CM | POA: Diagnosis not present

## 2018-04-24 DIAGNOSIS — R252 Cramp and spasm: Secondary | ICD-10-CM | POA: Diagnosis not present

## 2018-04-24 DIAGNOSIS — I2510 Atherosclerotic heart disease of native coronary artery without angina pectoris: Secondary | ICD-10-CM | POA: Diagnosis not present

## 2018-04-24 DIAGNOSIS — K219 Gastro-esophageal reflux disease without esophagitis: Secondary | ICD-10-CM

## 2018-04-24 DIAGNOSIS — R739 Hyperglycemia, unspecified: Secondary | ICD-10-CM

## 2018-04-24 DIAGNOSIS — I10 Essential (primary) hypertension: Secondary | ICD-10-CM | POA: Diagnosis not present

## 2018-04-24 NOTE — Progress Notes (Signed)
Patient ID: James Peck, male   DOB: Dec 02, 1940, 77 y.o.   MRN: [id number]   Subjective:    Patient ID: James Peck, male    DOB: [birth year], 77 y.o.   MRN: [id number]  HPI  Patient here for a scheduled follow up. Has had issues with left knee pain.  Seeing Dr Tamala Julian.  S/p injection 03/11/18.  Knee doing better.  Stays active.  No chest pain.  No sob.  No acid reflux.  No abdominal pain.  Bowels moving.  No urine change.  Some cramps in his legs at night.  Not every night.  Left > right. Overall he feels he is handling things relatively well.  Handling stress.      Past Medical History:  Diagnosis Date  . Allergy   . Arthritis   . Coronary artery disease   . GERD (gastroesophageal reflux disease)   . History of hiatal hernia   . Hypercholesteremia   . Hypertension    Past Surgical History:  Procedure Laterality Date  . CARDIAC CATHETERIZATION    . cardiac stents    . CATARACT EXTRACTION W/PHACO Right 09/10/2017   Procedure: CATARACT EXTRACTION PHACO AND INTRAOCULAR LENS PLACEMENT (IOC);  Surgeon: Birder Robson, MD;  Location: [facility] ORS;  Service: Ophthalmology;  Laterality: Right;  [place] 01:01.7 AP% 15.3 CDE 9.38 Fluid pack lot # [id number]  . CATARACT EXTRACTION W/PHACO Left 10/02/2017   Procedure: CATARACT EXTRACTION PHACO AND INTRAOCULAR LENS PLACEMENT (IOC);  Surgeon: Birder Robson, MD;  Location: [facility] ORS;  Service: Ophthalmology;  Laterality: Left;  [place] 00:42.3 AP% 16.2 CDE 6.85 Fluid Pack Lot # [id number]  . COLONOSCOPY WITH PROPOFOL N/A 06/28/2017   Procedure: COLONOSCOPY WITH PROPOFOL;  Surgeon: Manya Silvas, MD;  Location: [facility] ENDOSCOPY;  Service: Endoscopy;  Laterality: N/A;  . CORONARY ANGIOPLASTY     STENTS X 2  . KNEE ARTHROSCOPY     Family History  Problem Relation Age of Onset  . Heart disease Mother   . Diabetes Mother   . Heart disease Father   . Diabetes Father    Social History   Socioeconomic History  . Marital status: Married    Spouse name:  Not on file  . Number of children: Not on file  . Years of education: Not on file  . Highest education level: Not on file  Occupational History  . Not on file  Social Needs  . Financial resource strain: Not on file  . Food insecurity:    Worry: Not on file    Inability: Not on file  . Transportation needs:    Medical: Not on file    Non-medical: Not on file  Tobacco Use  . Smoking status: Former Research scientist (life sciences)  . Smokeless tobacco: Never Used  Substance and Sexual Activity  . Alcohol use: No  . Drug use: No  . Sexual activity: Not on file  Lifestyle  . Physical activity:    Days per week: Not on file    Minutes per session: Not on file  . Stress: Not on file  Relationships  . Social connections:    Talks on phone: Not on file    Gets together: Not on file    Attends religious service: Not on file    Active member of club or organization: Not on file    Attends meetings of clubs or organizations: Not on file    Relationship status: Not on file  Other Topics Concern  . Not on file  Social  History Narrative  . Not on file    Outpatient Encounter Medications as of 04/24/2018  Medication Sig  . Albuterol Sulfate (PROAIR RESPICLICK) 009 (90 Base) MCG/ACT AEPB Inhale 1-2 puffs into the lungs every 6 (six) hours as needed.  Marland Kitchen amLODipine (NORVASC) 5 MG tablet Take 1 tablet (5 mg total) by mouth daily.  Marland Kitchen aspirin EC 81 MG tablet Take 81 mg by mouth daily.   . calcium carbonate (OS-CAL - DOSED IN MG OF ELEMENTAL CALCIUM) 1250 (500 CA) MG tablet Take 1 tablet by mouth daily.   . cetirizine (ZYRTEC) 10 MG tablet Take 1 tablet (10 mg total) by mouth daily.  . diclofenac sodium (VOLTAREN) 1 % GEL Apply 2 g 2 (two) times daily topically. To affected joint.  . fluticasone (FLONASE) 50 MCG/ACT nasal spray Place 2 sprays into both nostrils daily.  Marland Kitchen GLUCOSAMINE-CHONDROITIN PO Take 1 capsule by mouth daily.  . Misc Natural Products (BLACK CHERRY CONCENTRATE PO) Take 1,000 mg daily by mouth.  .  Omega-3 Fatty Acids (FISH OIL) 1000 MG CAPS Take 1,000 mg by mouth daily.   Marland Kitchen omeprazole (PRILOSEC) 20 MG capsule Take 1 capsule (20 mg total) by mouth daily.  . ramipril (ALTACE) 5 MG capsule Take 1 capsule (5 mg total) by mouth daily.  . simvastatin (ZOCOR) 40 MG tablet Take 1 tablet (40 mg total) by mouth daily.  . tadalafil (CIALIS) 5 MG tablet Take 5 mg daily as needed by mouth for erectile dysfunction.  . [DISCONTINUED] chlorpheniramine-HYDROcodone (TUSSIONEX PENNKINETIC ER) 10-8 MG/5ML SUER Take 5 mLs by mouth at bedtime as needed for cough.  . [DISCONTINUED] doxycycline (VIBRA-TABS) 100 MG tablet Take 1 tablet (100 mg total) by mouth 2 (two) times daily. With food   No facility-administered encounter medications on file as of 04/24/2018.     Review of Systems  Constitutional: Negative for appetite change and unexpected weight change.  HENT: Negative for congestion and sinus pressure.   Respiratory: Negative for cough, chest tightness and shortness of breath.   Cardiovascular: Negative for chest pain, palpitations and leg swelling.  Gastrointestinal: Negative for abdominal pain, diarrhea, nausea and vomiting.  Genitourinary: Negative for difficulty urinating and dysuria.  Musculoskeletal: Negative for joint swelling.       Leg cramps as outlined.    Skin: Negative for color change and rash.  Neurological: Negative for dizziness, light-headedness and headaches.  Psychiatric/Behavioral: Negative for agitation and dysphoric mood.       Objective:    Physical Exam  Constitutional: He appears well-developed and well-nourished. No distress.  HENT:  Nose: Nose normal.  Mouth/Throat: Oropharynx is clear and moist.  Neck: Neck supple. No thyromegaly present.  Cardiovascular: Normal rate and regular rhythm.  Pulmonary/Chest: Effort normal and breath sounds normal. No respiratory distress.  Abdominal: Soft. Bowel sounds are normal. There is no tenderness.  Musculoskeletal: He exhibits  no edema or tenderness.  Lymphadenopathy:    He has no cervical adenopathy.  Skin: No rash noted. No erythema.  Psychiatric: He has a normal mood and affect. His behavior is normal.    BP 126/62 (BP Location: Left Arm, Patient Position: Sitting, Cuff Size: Normal)   Pulse 71   Temp (!) 97.5 F (36.4 C) (Oral)   Resp 18   Wt 163 lb 12.8 oz (74.3 kg)   SpO2 96%   BMI 26.44 kg/m  Wt Readings from Last 3 Encounters:  04/24/18 163 lb 12.8 oz (74.3 kg)  03/11/18 166 lb (75.3 kg)  01/06/18 166  lb (75.3 kg)     Lab Results  Component Value Date   GLUCOSE 111 (H) 12/16/2017   CHOL 152 12/16/2017   TRIG 108.0 12/16/2017   HDL 40.90 12/16/2017   LDLCALC 89 12/16/2017   ALT 25 12/16/2017   AST 21 12/16/2017   NA 138 12/16/2017   K 4.4 12/16/2017   CL 104 12/16/2017   CREATININE 0.95 12/16/2017   BUN 13 12/16/2017   CO2 24 12/16/2017   TSH 1.57 12/16/2017   PSA 1.39 12/16/2017   HGBA1C 5.8 (H) 01/09/2018       Assessment & Plan:   Problem List Items Addressed This Visit    Acid reflux    Controlled on prilosec.        Arteriosclerosis of coronary artery    Has known CAD.  Doing well.  Followed by Dr Ubaldo Glassing.  Continue risk factor modificaiton.        Relevant Orders   Hepatic function panel   Lipid panel   Benign essential HTN    Blood pressure under good control.  Continue same medication regimen.  Follow pressures.  Follow metabolic panel.        Relevant Orders   CBC with Differential/Platelet   Basic metabolic panel   Hyperglycemia    Low carb diet and exercise.  Follow met b and a1c.        Relevant Orders   Hemoglobin A1c   Leg cramps    Intermittent leg cramps as outlined.  Discussed stretches and staying hydrated.  Check electrolytes and magnesium.            Einar Pheasant, MD

## 2018-04-25 ENCOUNTER — Telehealth: Payer: Self-pay | Admitting: Radiology

## 2018-04-25 NOTE — Telephone Encounter (Signed)
Pt coming in for labs Monday, please place future orders. Thank you 

## 2018-04-27 DIAGNOSIS — R252 Cramp and spasm: Secondary | ICD-10-CM | POA: Insufficient documentation

## 2018-04-27 NOTE — Assessment & Plan Note (Signed)
Low carb diet and exercise.  Follow met b and a1c.   

## 2018-04-27 NOTE — Assessment & Plan Note (Signed)
Controlled on prilosec.   

## 2018-04-27 NOTE — Assessment & Plan Note (Signed)
Blood pressure under good control.  Continue same medication regimen.  Follow pressures.  Follow metabolic panel.   

## 2018-04-27 NOTE — Assessment & Plan Note (Signed)
Intermittent leg cramps as outlined.  Discussed stretches and staying hydrated.  Check electrolytes and magnesium.

## 2018-04-27 NOTE — Assessment & Plan Note (Signed)
Has known CAD.  Doing well.  Followed by Dr Ubaldo Glassing.  Continue risk factor modificaiton.

## 2018-04-28 ENCOUNTER — Other Ambulatory Visit (INDEPENDENT_AMBULATORY_CARE_PROVIDER_SITE_OTHER): Payer: PPO

## 2018-04-28 DIAGNOSIS — I10 Essential (primary) hypertension: Secondary | ICD-10-CM | POA: Diagnosis not present

## 2018-04-28 DIAGNOSIS — I2510 Atherosclerotic heart disease of native coronary artery without angina pectoris: Secondary | ICD-10-CM

## 2018-04-28 DIAGNOSIS — R739 Hyperglycemia, unspecified: Secondary | ICD-10-CM | POA: Diagnosis not present

## 2018-04-28 LAB — BASIC METABOLIC PANEL
BUN: 12 mg/dL (ref 6–23)
CALCIUM: 9.3 mg/dL (ref 8.4–10.5)
CHLORIDE: 103 meq/L (ref 96–112)
CO2: 29 meq/L (ref 19–32)
Creatinine, Ser: 0.93 mg/dL (ref 0.40–1.50)
GFR: 83.82 mL/min (ref >60.00)
Glucose, Bld: 108 mg/dL — ABNORMAL HIGH (ref 70–99)
Potassium: 5 mEq/L (ref 3.5–5.1)
SODIUM: 139 meq/L (ref 135–145)

## 2018-04-28 LAB — LIPID PANEL
CHOL/HDL RATIO: 3
Cholesterol: 135 mg/dL (ref 0–200)
HDL: 42.2 mg/dL (ref >39.00)
LDL CALC: 80 mg/dL (ref 0–99)
NONHDL: 93.2
Triglycerides: 66 mg/dL (ref 0.0–149.0)
VLDL: 13.2 mg/dL (ref 0.0–40.0)

## 2018-04-28 LAB — HEPATIC FUNCTION PANEL
ALT: 21 U/L (ref 0–53)
AST: 20 U/L (ref 0–37)
Albumin: 4.1 g/dL (ref 3.5–5.2)
Alkaline Phosphatase: 50 U/L (ref 39–117)
BILIRUBIN TOTAL: 0.4 mg/dL (ref 0.2–1.2)
Bilirubin, Direct: 0.1 mg/dL (ref 0.0–0.3)
Total Protein: 6.3 g/dL (ref 6.0–8.3)

## 2018-04-28 LAB — CBC WITH DIFFERENTIAL/PLATELET
Basophils Absolute: 0 10*3/uL (ref 0.0–0.1)
Basophils Relative: 0.6 % (ref 0.0–3.0)
EOS PCT: 2.6 % (ref 0.0–5.0)
Eosinophils Absolute: 0.2 10*3/uL (ref 0.0–0.7)
HCT: 42.8 % (ref 39.0–52.0)
HEMOGLOBIN: 14.6 g/dL (ref 13.0–17.0)
LYMPHS ABS: 1.7 10*3/uL (ref 0.7–4.0)
Lymphocytes Relative: 25.8 % (ref 12.0–46.0)
MCHC: 34 g/dL (ref 30.0–36.0)
MCV: 92.3 fl (ref 78.0–100.0)
MONOS PCT: 11 % (ref 3.0–12.0)
Monocytes Absolute: 0.7 10*3/uL (ref 0.1–1.0)
NEUTROS PCT: 60 % (ref 43.0–77.0)
Neutro Abs: 3.9 10*3/uL (ref 1.4–7.7)
Platelets: 292 10*3/uL (ref 150.0–400.0)
RBC: 4.64 Mil/uL (ref 4.22–5.81)
RDW: 13.8 % (ref 11.5–15.5)
WBC: 6.5 10*3/uL (ref 4.0–10.5)

## 2018-04-28 LAB — HEMOGLOBIN A1C: HEMOGLOBIN A1C: 6.3 % (ref 4.6–6.5)

## 2018-04-28 NOTE — Telephone Encounter (Signed)
Labs in system

## 2018-04-29 ENCOUNTER — Encounter: Payer: Self-pay | Admitting: *Deleted

## 2018-04-29 DIAGNOSIS — R252 Cramp and spasm: Secondary | ICD-10-CM | POA: Diagnosis not present

## 2018-04-29 DIAGNOSIS — I10 Essential (primary) hypertension: Secondary | ICD-10-CM | POA: Diagnosis not present

## 2018-04-29 DIAGNOSIS — Z23 Encounter for immunization: Secondary | ICD-10-CM

## 2018-04-29 DIAGNOSIS — I2510 Atherosclerotic heart disease of native coronary artery without angina pectoris: Secondary | ICD-10-CM | POA: Diagnosis not present

## 2018-04-29 DIAGNOSIS — K219 Gastro-esophageal reflux disease without esophagitis: Secondary | ICD-10-CM | POA: Diagnosis not present

## 2018-04-29 DIAGNOSIS — R739 Hyperglycemia, unspecified: Secondary | ICD-10-CM | POA: Diagnosis not present

## 2018-05-01 DIAGNOSIS — H26492 Other secondary cataract, left eye: Secondary | ICD-10-CM | POA: Diagnosis not present

## 2018-05-05 NOTE — Telephone Encounter (Signed)
Order placed for lifestyles referral.  

## 2018-05-06 ENCOUNTER — Other Ambulatory Visit: Payer: Self-pay | Admitting: Internal Medicine

## 2018-05-20 ENCOUNTER — Ambulatory Visit: Payer: PPO | Admitting: Family Medicine

## 2018-05-27 ENCOUNTER — Encounter: Payer: Self-pay | Admitting: Dietician

## 2018-05-27 ENCOUNTER — Encounter: Payer: PPO | Attending: Internal Medicine | Admitting: Dietician

## 2018-05-27 VITALS — Ht 66.0 in | Wt 160.0 lb

## 2018-05-27 DIAGNOSIS — R7303 Prediabetes: Secondary | ICD-10-CM | POA: Insufficient documentation

## 2018-05-27 DIAGNOSIS — Z713 Dietary counseling and surveillance: Secondary | ICD-10-CM | POA: Diagnosis not present

## 2018-05-27 DIAGNOSIS — I10 Essential (primary) hypertension: Secondary | ICD-10-CM | POA: Insufficient documentation

## 2018-05-27 DIAGNOSIS — R739 Hyperglycemia, unspecified: Secondary | ICD-10-CM | POA: Diagnosis not present

## 2018-05-27 NOTE — Patient Instructions (Signed)
Balance meals with protein, 2-4 servings of carbohydrate, small amounts of fat and non-starchy vegetables. Limit fruit to one serving per meal or snack but can have several times daily. Avoid beverages with sugar including fruit juice. Sweeten with Stevia if need a sweetener added. Work toward a structured exercise program of 5 days per week for 30-45 minutes. Add carrots and fruit to sandwich meal. Consider Glucerna or Boost Control as a meal replacement rather than skipping a meal.

## 2018-05-27 NOTE — Progress Notes (Signed)
Medical Nutrition Therapy: Visit start time: 10:30  end time: 11:30am  Assessment:  Diagnosis: hyperglycemia Past medical history: hypertension, hx of hyperlipidemia Psychosocial issues/ stress concerns:  Patient rates his stress as moderate and indicates "ok" as to how well he is dealing with his stress  Preferred learning method:  . Auditory . Visual . Hands-on Current weight: 162.6 lbs    Height: 66 " BMI- 26 Medications, supplements: none  Progress and evaluation:  Patient accompanied by his wife in for initial medical nutrition therapy appointment. In the past 3 months his HgA1c increased from 5.8 to 6.3. He typically eats 3 meals daily. Does not typically snack during the day and sometimes eats an evening snack such as peanuts or popcorn. Concentrated sugar sources are fruit juice daily,  sugar in his coffee and  sweet tea when eating "out" several night per week.  He drinks 2-3 cups of water daily and diet soda for lunch. 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 He limits fried chicken and fried fish to once each weekly  Depending on his schedule, he sometimes drinks a SlimFast or eats nabs in place of lunch. He eats "out" 7-8 meals per week. His wife prepares the dinner meal 4-5 evenings/week which typically consists of baked meat, usually chicken, and 2 vegetables. He likes a limited variety of vegetables.  In general, he controls portions and has kept a relatively stable weight in 155-160 lb range. He limits sweets to no more than 1-2 times weekly with a mini [employer] frosty as an example.  Physical activity:Is able on some weeks to walk 5 days per week for 30 minutes  Nutrition Care Education: Pre-Diabetes: Instructed on general dietary guidelines for prediabetes. Instructed patient on a meal plan based on 1600-1700 calories including carbohydrate counting and how to better balance carbohydrate, protein, small amounts of fat and  non-starchy vegetables. Used food guide plate, food models and "Planning a Balanced Meal" and sample menus to instruct. Discussed how exercise can decrease insulin resistance as well as help with weight control.   Nutritional Diagnosis:  NB-1.1 Food and nutrition-related knowledge deficit As related to dietary guidelines for pre-diabetes.  As evidenced by diet history and desire by both patient and his wife for meal plan information..  Intervention:  Balance meals with protein, 2-4 servings of carbohydrate, small amounts of fat and non-starchy vegetables. Limit fruit to one serving per meal or snack but can have several times daily. Avoid beverages with sugar including fruit juice. Sweeten with Stevia if need a sweetener added. Work toward a structured exercise program of 5 days per week for 30-45 minutes. Add carrots and fruit to sandwich meal. Consider Glucerna or Boost Control as a meal replacement rather than skipping a meal.  Education Materials given:  . Plate Planner . Food lists/ Planning A Balanced Meal . Sample meal pattern/ menus . Goals/ instructions Learner/ who was taught:  . Patient  . Spouse/ partner  Level of understanding: Marland Kitchen Verbalized understanding  Demonstrated degree of understanding via:   Teach back  Learning barriers: . None Willingness to learn/ readiness for change: . Acceptance, ready for change  Monitoring and Evaluation:  Patient did not schedule a follow-up appointment at this time. Encouraged he and his wife to call if has further questions or desires further help with his meal plan/nutrition.

## 2018-06-16 ENCOUNTER — Other Ambulatory Visit: Payer: Self-pay | Admitting: Internal Medicine

## 2018-06-23 DIAGNOSIS — M545 Low back pain: Secondary | ICD-10-CM | POA: Diagnosis not present

## 2018-06-23 DIAGNOSIS — M9904 Segmental and somatic dysfunction of sacral region: Secondary | ICD-10-CM | POA: Diagnosis not present

## 2018-06-23 DIAGNOSIS — M5442 Lumbago with sciatica, left side: Secondary | ICD-10-CM | POA: Diagnosis not present

## 2018-06-23 DIAGNOSIS — M461 Sacroiliitis, not elsewhere classified: Secondary | ICD-10-CM | POA: Diagnosis not present

## 2018-06-23 DIAGNOSIS — M9903 Segmental and somatic dysfunction of lumbar region: Secondary | ICD-10-CM | POA: Diagnosis not present

## 2018-06-24 ENCOUNTER — Ambulatory Visit (INDEPENDENT_AMBULATORY_CARE_PROVIDER_SITE_OTHER): Payer: PPO

## 2018-06-24 VITALS — BP 122/62 | HR 78 | Temp 97.9°F | Resp 15 | Ht 66.0 in | Wt 160.8 lb

## 2018-06-24 DIAGNOSIS — Z0000 Encounter for general adult medical examination without abnormal findings: Secondary | ICD-10-CM

## 2018-06-24 DIAGNOSIS — M9903 Segmental and somatic dysfunction of lumbar region: Secondary | ICD-10-CM | POA: Diagnosis not present

## 2018-06-24 DIAGNOSIS — M5442 Lumbago with sciatica, left side: Secondary | ICD-10-CM | POA: Diagnosis not present

## 2018-06-24 DIAGNOSIS — M9904 Segmental and somatic dysfunction of sacral region: Secondary | ICD-10-CM | POA: Diagnosis not present

## 2018-06-24 DIAGNOSIS — M545 Low back pain: Secondary | ICD-10-CM | POA: Diagnosis not present

## 2018-06-24 DIAGNOSIS — M461 Sacroiliitis, not elsewhere classified: Secondary | ICD-10-CM | POA: Diagnosis not present

## 2018-06-24 NOTE — Patient Instructions (Addendum)
  [name] , Thank you for taking time to come for your Medicare Wellness Visit. I appreciate your ongoing commitment to your health goals. Please review the following plan we discussed and let me know if I can assist you in the future.  Follow up as needed.    Bring a copy of your [place] and/or Living Will to be scanned into chart.  Happy Holidays!!  These are the goals we discussed: Goals      Patient Stated   . Weight (lb) < 155 lb (70.3 kg) (pt-stated)       This is a list of the screening recommended for you and due dates:  Health Maintenance  Topic Date Due  . Tetanus Vaccine  03/12/2023  . Flu Shot  Completed  . Pneumonia vaccines  Discontinued

## 2018-06-24 NOTE — Progress Notes (Signed)
Subjective:   James Peck is a 77 y.o. male who presents for an Initial Medicare Annual Wellness Visit.  Review of Systems  No ROS.  Medicare Wellness Visit. Additional risk factors are reflected in the social history. Cardiac Risk Factors include: advanced age (>3men, >44 women);male gender;hypertension    Objective:    Today's Vitals   06/24/18 0912  BP: 122/62  Pulse: 78  Resp: 15  Temp: 97.9 F (36.6 C)  TempSrc: Oral  SpO2: 95%  Weight: 160 lb 12.8 oz (72.9 kg)  Height: 5\' 6"  (1.676 m)   Body mass index is 25.95 kg/m.  Advanced Directives 06/24/2018 05/27/2018 10/02/2017  Does Patient Have a Medical Advance Directive? Yes Yes Yes  Type of Advance Directive Healthcare Power of [place] will  Does patient want to make changes to medical advance directive? No - Patient declined - No - Patient declined  Copy of James Peck in Chart? No - copy requested - -    Current Medications (verified) Outpatient Encounter Medications as of 06/24/2018  Medication Sig  . amLODipine (NORVASC) 5 MG tablet Take 1 tablet (5 mg total) by mouth daily.  Marland Kitchen aspirin EC 81 MG tablet Take 81 mg by mouth daily.   . calcium carbonate (OS-CAL - DOSED IN MG OF ELEMENTAL CALCIUM) 1250 (500 CA) MG tablet Take 1 tablet by mouth daily.   . cetirizine (ZYRTEC) 10 MG tablet TAKE ONE TABLET BY MOUTH DAILY  . CINNAMON PO Take 1,200 mg by mouth 2 times daily at 12 noon and 4 pm.  . diclofenac sodium (VOLTAREN) 1 % GEL Apply 2 g 2 (two) times daily topically. To affected joint.  . fluticasone (FLONASE) 50 MCG/ACT nasal spray Place 2 sprays into both nostrils daily.  Marland Kitchen GLUCOSAMINE-CHONDROITIN PO Take 1 capsule by mouth daily.  . Misc Natural Products (BLACK CHERRY CONCENTRATE PO) Take 1,000 mg daily by mouth.  . Omega-3 Fatty Acids (FISH OIL) 1000 MG CAPS Take 1,000 mg by mouth daily.   Marland Kitchen omeprazole (PRILOSEC) 20 MG capsule Take 1 capsule (20 mg total)  by mouth daily.  . ramipril (ALTACE) 5 MG capsule Take 1 capsule (5 mg total) by mouth daily.  . simvastatin (ZOCOR) 40 MG tablet TAKE ONE TABLET BY MOUTH DAILY  . tadalafil (CIALIS) 5 MG tablet Take 5 mg daily as needed by mouth for erectile dysfunction.  . Albuterol Sulfate (PROAIR RESPICLICK) 295 (90 Base) MCG/ACT AEPB Inhale 1-2 puffs into the lungs every 6 (six) hours as needed. (Patient not taking: Reported on 06/24/2018)   No facility-administered encounter medications on file as of 06/24/2018.     Allergies (verified) Patient has no known allergies.   History: Past Medical History:  Diagnosis Date  . Allergy   . Arthritis   . Coronary artery disease   . GERD (gastroesophageal reflux disease)   . History of hiatal hernia   . Hypercholesteremia   . Hypertension    Past Surgical History:  Procedure Laterality Date  . CARDIAC CATHETERIZATION    . cardiac stents    . CATARACT EXTRACTION W/PHACO Right 09/10/2017   Procedure: CATARACT EXTRACTION PHACO AND INTRAOCULAR LENS PLACEMENT (IOC);  Surgeon: Birder Robson, MD;  Location: [facility] ORS;  Service: Ophthalmology;  Laterality: Right;  [place] 01:01.7 AP% 15.3 CDE 9.38 Fluid pack lot # [id number]  . CATARACT EXTRACTION W/PHACO Left 10/02/2017   Procedure: CATARACT EXTRACTION PHACO AND INTRAOCULAR LENS PLACEMENT (IOC);  Surgeon: Birder Robson, MD;  Location: [facility] ORS;  Service: Ophthalmology;  Laterality: Left;  [place] 00:42.3 AP% 16.2 CDE 6.85 Fluid Pack Lot # [id number]  . COLONOSCOPY WITH PROPOFOL N/A 06/28/2017   Procedure: COLONOSCOPY WITH PROPOFOL;  Surgeon: Manya Silvas, MD;  Location: [facility] ENDOSCOPY;  Service: Endoscopy;  Laterality: N/A;  . CORONARY ANGIOPLASTY     STENTS X 2  . KNEE ARTHROSCOPY     Family History  Problem Relation Age of Onset  . Heart disease Mother   . Diabetes Mother   . Heart disease Father   . Diabetes Father    Social History   Socioeconomic History  . Marital status: Married    Spouse  name: Not on file  . Number of children: Not on file  . Years of education: Not on file  . Highest education level: Not on file  Occupational History  . Not on file  Social Needs  . Financial resource strain: Not hard at all  . Food insecurity:    Worry: Never true    Inability: Never true  . Transportation needs:    Medical: No    Non-medical: No  Tobacco Use  . Smoking status: Former Research scientist (life sciences)  . Smokeless tobacco: Never Used  Substance and Sexual Activity  . Alcohol use: No  . Drug use: No  . Sexual activity: Not on file  Lifestyle  . Physical activity:    Days per week: 5 days    Minutes per session: 30 min  . Stress: Not at all  Relationships  . Social connections:    Talks on phone: Not on file    Gets together: Not on file    Attends religious service: Not on file    Active member of club or organization: Not on file    Attends meetings of clubs or organizations: Not on file    Relationship status: Married  Other Topics Concern  . Not on file  Social History Narrative  . Not on file   Tobacco Counseling Counseling given: Not Answered   Clinical Intake:  Pre-visit preparation completed: Yes        Nutritional Status: BMI 25 -29 Overweight Diabetes: No  How often do you need to have someone help you when you read instructions, pamphlets, or other written materials from your doctor or pharmacy?: 1 - Never  Interpreter Needed?: No     Activities of Daily Living In your present state of health, do you have any difficulty performing the following activities: 06/24/2018 10/02/2017  Hearing? N N  Vision? N N  Difficulty concentrating or making decisions? N N  Walking or climbing stairs? Y N  Comment Back and leg pain when walking long distances and walking fast paced.  -  Dressing or bathing? N N  Doing errands, shopping? N -  Preparing Food and eating ? N -  Using the Toilet? N -  In the past six months, have you accidently leaked urine? N -  Do you  have problems with loss of bowel control? N -  Managing your Medications? N -  Managing your Finances? N -  Housekeeping or managing your Housekeeping? N -  Some recent data might be hidden     Immunizations and Health Maintenance Immunization History  Administered Date(s) Administered  . Influenza Split 05/11/2014  . Influenza, High Dose Seasonal PF 04/23/2017, 04/29/2018  . Influenza-Unspecified 04/25/2015, 04/23/2017  . Tdap 03/11/2013   There are no preventive care reminders to display for this patient.  Patient Care  Team: Einar Pheasant, MD as PCP - General (Internal Medicine)  Indicate any recent Medical Services you may have received from other than [facility] providers in the past year (date may be approximate).    Assessment:   This is a routine wellness examination for James Peck. The goal of the wellness visit is to assist the patient how to close the gaps in care and create a preventative care plan for the patient.   The roster of all physicians providing medical care to patient is listed in the Snapshot section of the chart.  Safety issues reviewed; Smoke and carbon monoxide detectors in the home. No firearms in the home. Wears seatbelts when driving or riding with others. No violence in the home.  They do not have excessive sun exposure.  Discussed the need for sun protection: hats, long sleeves and the use of sunscreen if there is significant sun exposure.  No new identified risk were noted.  No failures at ADL's or IADL's.    BMI- discussed the importance of a healthy diet, water intake and the benefits of aerobic exercise. Educational material provided.   24 hour diet recall: Healthy diet.   Dental- every 6 months.  Sleep patterns- Sleeps well through the night.   Health maintenance gaps- closed.  Pneumococcal vaccine discussed. Discontinued per patient request. History of extreme redness and swelling.   Patient Concerns: None at this time. Follow up with  PCP as needed.  Hearing/Vision screen  Visual Acuity Screening   Right eye Left eye Both eyes  Without correction:     With correction:   20/25  Comments: Followed by [facility] Cataract extracted, bilateral Last OV 04/2018  Hearing Screening Comments: Patient is able to hear conversational tones without difficulty.  No issues reported.   Dietary issues and exercise activities discussed: Current Exercise Habits: Home exercise routine, Type of exercise: walking, Time (Minutes): 30, Frequency (Times/Week): 5, Weekly Exercise (Minutes/Week): 150, Intensity: Mild  Goals      Patient Stated   . Weight (lb) < 155 lb (70.3 kg) (pt-stated)      Depression Screen PHQ 2/9 Scores 06/24/2018 05/27/2018 01/06/2018 08/26/2017  PHQ - 2 Score 0 0 0 0    Fall Risk Fall Risk  06/24/2018 05/27/2018 01/06/2018 08/26/2017  Falls in the past year? 0 No No No   Cognitive Function: MMSE - Mini Mental State Exam 06/24/2018  Orientation to time 5  Orientation to Place 5  Registration 3  Attention/ Calculation 5  Recall 3  Language- name 2 objects 2  Language- repeat 1  Language- follow 3 step command 3  Language- read & follow direction 1  Write a sentence 1  Copy design 1  Total score 30        Screening Tests Health Maintenance  Topic Date Due  . TETANUS/TDAP  03/12/2023  . INFLUENZA VACCINE  Completed  . PNA vac Low Risk Adult  Discontinued      Plan:    End of life planning; Advance aging; Advanced directives discussed. Copy of current HCPOA/Living Will requested.    I have personally reviewed and noted the following in the patient's chart:   . Medical and social history . Use of alcohol, tobacco or illicit drugs  . Current medications and supplements . Functional ability and status . Nutritional status . Physical activity . Advanced directives . List of other physicians . Hospitalizations, surgeries, and ER visits in previous 12 months . Vitals . Screenings to  include cognitive, depression,  and falls . Referrals and appointments  In addition, I have reviewed and discussed with patient certain preventive protocols, quality metrics, and best practice recommendations. A written personalized care plan for preventive services as well as general preventive health recommendations were provided to patient.     Varney Biles, LPN   [id number]

## 2018-06-30 DIAGNOSIS — M9904 Segmental and somatic dysfunction of sacral region: Secondary | ICD-10-CM | POA: Diagnosis not present

## 2018-06-30 DIAGNOSIS — M461 Sacroiliitis, not elsewhere classified: Secondary | ICD-10-CM | POA: Diagnosis not present

## 2018-06-30 DIAGNOSIS — M5442 Lumbago with sciatica, left side: Secondary | ICD-10-CM | POA: Diagnosis not present

## 2018-06-30 DIAGNOSIS — M9903 Segmental and somatic dysfunction of lumbar region: Secondary | ICD-10-CM | POA: Diagnosis not present

## 2018-06-30 DIAGNOSIS — M545 Low back pain: Secondary | ICD-10-CM | POA: Diagnosis not present

## 2018-07-01 DIAGNOSIS — M9903 Segmental and somatic dysfunction of lumbar region: Secondary | ICD-10-CM | POA: Diagnosis not present

## 2018-07-01 DIAGNOSIS — M5442 Lumbago with sciatica, left side: Secondary | ICD-10-CM | POA: Diagnosis not present

## 2018-07-01 DIAGNOSIS — M461 Sacroiliitis, not elsewhere classified: Secondary | ICD-10-CM | POA: Diagnosis not present

## 2018-07-01 DIAGNOSIS — M545 Low back pain: Secondary | ICD-10-CM | POA: Diagnosis not present

## 2018-07-01 DIAGNOSIS — M9904 Segmental and somatic dysfunction of sacral region: Secondary | ICD-10-CM | POA: Diagnosis not present

## 2018-07-03 DIAGNOSIS — M9904 Segmental and somatic dysfunction of sacral region: Secondary | ICD-10-CM | POA: Diagnosis not present

## 2018-07-03 DIAGNOSIS — M5442 Lumbago with sciatica, left side: Secondary | ICD-10-CM | POA: Diagnosis not present

## 2018-07-03 DIAGNOSIS — M545 Low back pain: Secondary | ICD-10-CM | POA: Diagnosis not present

## 2018-07-03 DIAGNOSIS — M461 Sacroiliitis, not elsewhere classified: Secondary | ICD-10-CM | POA: Diagnosis not present

## 2018-07-03 DIAGNOSIS — M9903 Segmental and somatic dysfunction of lumbar region: Secondary | ICD-10-CM | POA: Diagnosis not present

## 2018-07-08 DIAGNOSIS — M5442 Lumbago with sciatica, left side: Secondary | ICD-10-CM | POA: Diagnosis not present

## 2018-07-08 DIAGNOSIS — M9904 Segmental and somatic dysfunction of sacral region: Secondary | ICD-10-CM | POA: Diagnosis not present

## 2018-07-08 DIAGNOSIS — M461 Sacroiliitis, not elsewhere classified: Secondary | ICD-10-CM | POA: Diagnosis not present

## 2018-07-08 DIAGNOSIS — M545 Low back pain: Secondary | ICD-10-CM | POA: Diagnosis not present

## 2018-07-08 DIAGNOSIS — M9903 Segmental and somatic dysfunction of lumbar region: Secondary | ICD-10-CM | POA: Diagnosis not present

## 2018-07-14 ENCOUNTER — Ambulatory Visit (INDEPENDENT_AMBULATORY_CARE_PROVIDER_SITE_OTHER): Payer: PPO | Admitting: Family Medicine

## 2018-07-14 ENCOUNTER — Encounter: Payer: Self-pay | Admitting: Family Medicine

## 2018-07-14 ENCOUNTER — Ambulatory Visit: Payer: Self-pay

## 2018-07-14 VITALS — BP 110/72 | HR 73 | Ht 66.0 in | Wt 160.0 lb

## 2018-07-14 DIAGNOSIS — M25562 Pain in left knee: Secondary | ICD-10-CM | POA: Diagnosis not present

## 2018-07-14 DIAGNOSIS — G8929 Other chronic pain: Secondary | ICD-10-CM

## 2018-07-14 DIAGNOSIS — M1712 Unilateral primary osteoarthritis, left knee: Secondary | ICD-10-CM

## 2018-07-14 NOTE — Assessment & Plan Note (Signed)
Acute on chronic exacerbation of his underlying pain related degenerative changes. -Injection today. -Counseled on home exercise therapy and supportive care. -If no improvement can consider gel injections.

## 2018-07-14 NOTE — Patient Instructions (Signed)
Nice to meet you  Please try ice on the knee  Please let us know if the pain returns sooner than three months and we could try different injections  Please see [place] back in 3-4 weeks if no better.  Happy Holidays

## 2018-07-14 NOTE — Progress Notes (Signed)
James Peck - 77 y.o. male MRN [id number]  Date of birth: 1940-12-07.  SUBJECTIVE:  Including CC & ROS.  No chief complaint on file. Fontaine No am serving as a Education administrator for Dr. Clearance Coots.   James Peck is a 77 y.o. male that complains of left knee pain. Has been seen by other sports medicine provider for steroid injection in August 2019. Patient states that his pain has began to increase over the past 2 weeks. Prolonged standing increases his pain. Ices occasionally.  He has had arthroscopy on his knee in 2016.  Independent review of his left knee x-ray from 2016 shows degenerative changes the medial lateral joint line.  Review of the MRI of the left knee from 2016 shows a flap tear of the body the medial meniscus flipped peripherally along the medial femoral condyle.  Also shows high-grade partial-thickness cartilage loss of the medial femoral condyle and medial tibial plateau with areas of full-thickness cartilage loss in the medial femoral condyle with subchondral reactive marrow edema.   Review of Systems  Constitutional: Negative for fever.  HENT: Negative for congestion.   Respiratory: Negative for cough.   Cardiovascular: Negative for chest pain.  Gastrointestinal: Negative for abdominal pain.  Musculoskeletal: Positive for arthralgias.  Skin: Negative for color change.  Neurological: Negative for weakness.  Hematological: Negative for adenopathy.  Psychiatric/Behavioral: Negative for agitation.    HISTORY: Past Medical, Surgical, Social, and Family History Reviewed & Updated per EMR.   Pertinent Historical Findings include:  Past Medical History:  Diagnosis Date  . Allergy   . Arthritis   . Coronary artery disease   . GERD (gastroesophageal reflux disease)   . History of hiatal hernia   . Hypercholesteremia   . Hypertension     Past Surgical History:  Procedure Laterality Date  . CARDIAC CATHETERIZATION    . cardiac stents    . CATARACT EXTRACTION W/PHACO  Right 09/10/2017   Procedure: CATARACT EXTRACTION PHACO AND INTRAOCULAR LENS PLACEMENT (IOC);  Surgeon: Birder Robson, MD;  Location: [facility] ORS;  Service: Ophthalmology;  Laterality: Right;  [place] 01:01.7 AP% 15.3 CDE 9.38 Fluid pack lot # [id number]  . CATARACT EXTRACTION W/PHACO Left 10/02/2017   Procedure: CATARACT EXTRACTION PHACO AND INTRAOCULAR LENS PLACEMENT (IOC);  Surgeon: Birder Robson, MD;  Location: [facility] ORS;  Service: Ophthalmology;  Laterality: Left;  [place] 00:42.3 AP% 16.2 CDE 6.85 Fluid Pack Lot # [id number]  . COLONOSCOPY WITH PROPOFOL N/A 06/28/2017   Procedure: COLONOSCOPY WITH PROPOFOL;  Surgeon: Manya Silvas, MD;  Location: [facility] ENDOSCOPY;  Service: Endoscopy;  Laterality: N/A;  . CORONARY ANGIOPLASTY     STENTS X 2  . KNEE ARTHROSCOPY      No Known Allergies  Family History  Problem Relation Age of Onset  . Heart disease Mother   . Diabetes Mother   . Heart disease Father   . Diabetes Father      Social History   Socioeconomic History  . Marital status: Married    Spouse name: Not on file  . Number of children: Not on file  . Years of education: Not on file  . Highest education level: Not on file  Occupational History  . Not on file  Social Needs  . Financial resource strain: Not hard at all  . Food insecurity:    Worry: Never true    Inability: Never true  . Transportation needs:    Medical: No    Non-medical: No  Tobacco Use  .  Smoking status: Former Research scientist (life sciences)  . Smokeless tobacco: Never Used  Substance and Sexual Activity  . Alcohol use: No  . Drug use: No  . Sexual activity: Not on file  Lifestyle  . Physical activity:    Days per week: 5 days    Minutes per session: 30 min  . Stress: Not at all  Relationships  . Social connections:    Talks on phone: Not on file    Gets together: Not on file    Attends religious service: Not on file    Active member of club or organization: Not on file    Attends meetings of clubs or  organizations: Not on file    Relationship status: Married  . Intimate partner violence:    Fear of current or ex partner: No    Emotionally abused: No    Physically abused: No    Forced sexual activity: No  Other Topics Concern  . Not on file  Social History Narrative  . Not on file     PHYSICAL EXAM:  VS: BP 110/72   Pulse 73   Ht 5\' 6"  (1.676 m)   Wt 160 lb (72.6 kg)   SpO2 93%   BMI 25.82 kg/m  Physical Exam Gen: NAD, alert, cooperative with exam, well-appearing ENT: normal lips, normal nasal mucosa,  Eye: normal EOM, normal conjunctiva and lids CV:  no edema, +2 pedal pulses   Resp: no accessory muscle use, non-labored,  Skin: no rashes, no areas of induration  Neuro: normal tone, normal sensation to touch Psych:  normal insight, alert and oriented MSK:  Left knee:  no obvious effusion. Normal range of motion. Normal strength resistance. No instability. Negative Murray's test. Neurovascular intact   Aspiration/Injection Procedure Note James Peck [birth year]  Procedure: Injection Indications: left knee pain   Procedure Details Consent: Risks of procedure as well as the alternatives and risks of each were explained to the (patient/caregiver).  Consent for procedure obtained. Time Out: Verified patient identification, verified procedure, site/side was marked, verified correct patient position, special equipment/implants available, medications/allergies/relevent history reviewed, required imaging and test results available.  Performed.  The area was cleaned with iodine and alcohol swabs.    The left knee superior lateral suprapatellar pouch was injected using 1 cc's of 40 mg Kenalog and 4 cc's of 0.5% bupivacaine with a 22 1 1/2" needle.  Ultrasound was used. Images were obtained in Long views showing the injection.     A sterile dressing was applied.  Patient did tolerate procedure well.       ASSESSMENT & PLAN:   Degenerative arthritis of left  knee Acute on chronic exacerbation of his underlying pain related degenerative changes. -Injection today. -Counseled on home exercise therapy and supportive care. -If no improvement can consider gel injections.   The above documentation has been reviewed and is accurate and complete. Clearance Coots, MD 07/14/2018, 11:52 AM>

## 2018-08-11 ENCOUNTER — Ambulatory Visit: Payer: PPO | Admitting: Family Medicine

## 2018-08-28 ENCOUNTER — Encounter: Payer: Self-pay | Admitting: Internal Medicine

## 2018-08-28 ENCOUNTER — Ambulatory Visit (INDEPENDENT_AMBULATORY_CARE_PROVIDER_SITE_OTHER): Payer: PPO | Admitting: Internal Medicine

## 2018-08-28 VITALS — BP 116/78 | HR 84 | Temp 98.4°F | Resp 16 | Wt 153.2 lb

## 2018-08-28 DIAGNOSIS — R739 Hyperglycemia, unspecified: Secondary | ICD-10-CM

## 2018-08-28 DIAGNOSIS — D126 Benign neoplasm of colon, unspecified: Secondary | ICD-10-CM | POA: Diagnosis not present

## 2018-08-28 DIAGNOSIS — M1712 Unilateral primary osteoarthritis, left knee: Secondary | ICD-10-CM | POA: Diagnosis not present

## 2018-08-28 DIAGNOSIS — E785 Hyperlipidemia, unspecified: Secondary | ICD-10-CM | POA: Diagnosis not present

## 2018-08-28 DIAGNOSIS — I10 Essential (primary) hypertension: Secondary | ICD-10-CM

## 2018-08-28 DIAGNOSIS — Z0000 Encounter for general adult medical examination without abnormal findings: Secondary | ICD-10-CM

## 2018-08-28 DIAGNOSIS — K219 Gastro-esophageal reflux disease without esophagitis: Secondary | ICD-10-CM | POA: Diagnosis not present

## 2018-08-28 DIAGNOSIS — I2510 Atherosclerotic heart disease of native coronary artery without angina pectoris: Secondary | ICD-10-CM | POA: Diagnosis not present

## 2018-08-28 LAB — BASIC METABOLIC PANEL
BUN: 15 mg/dL (ref 6–23)
CO2: 28 meq/L (ref 19–32)
Calcium: 9.7 mg/dL (ref 8.4–10.5)
Chloride: 102 mEq/L (ref 96–112)
Creatinine, Ser: 1.03 mg/dL (ref 0.40–1.50)
GFR: 70.03 mL/min (ref >60.00)
GLUCOSE: 104 mg/dL — AB (ref 70–99)
Potassium: 4.4 mEq/L (ref 3.5–5.1)
SODIUM: 137 meq/L (ref 135–145)

## 2018-08-28 LAB — HEPATIC FUNCTION PANEL
ALBUMIN: 4.4 g/dL (ref 3.5–5.2)
ALT: 21 U/L (ref 0–53)
AST: 21 U/L (ref 0–37)
Alkaline Phosphatase: 53 U/L (ref 39–117)
Bilirubin, Direct: 0.1 mg/dL (ref 0.0–0.3)
Total Bilirubin: 0.7 mg/dL (ref 0.2–1.2)
Total Protein: 6.9 g/dL (ref 6.0–8.3)

## 2018-08-28 LAB — LIPID PANEL
Cholesterol: 141 mg/dL (ref 0–200)
HDL: 43.4 mg/dL (ref >39.00)
LDL Cholesterol: 84 mg/dL (ref 0–99)
NonHDL: 97.63
Total CHOL/HDL Ratio: 3
Triglycerides: 69 mg/dL (ref 0.0–149.0)
VLDL: 13.8 mg/dL (ref 0.0–40.0)

## 2018-08-28 LAB — HEMOGLOBIN A1C: Hgb A1c MFr Bld: 5.9 % (ref 4.6–6.5)

## 2018-08-28 MED ORDER — AMLODIPINE BESYLATE 5 MG PO TABS: 5.0000 mg | ORAL_TABLET | Freq: Every day | ORAL | 1 refills | Status: DC

## 2018-08-28 MED ORDER — SILDENAFIL CITRATE 20 MG PO TABS: ORAL_TABLET | ORAL | 0 refills | Status: DC

## 2018-08-28 MED ORDER — OMEPRAZOLE 20 MG PO CPDR: 20.0000 mg | DELAYED_RELEASE_CAPSULE | Freq: Every day | ORAL | 1 refills | Status: DC

## 2018-08-28 MED ORDER — FLUTICASONE PROPIONATE 50 MCG/ACT NA SUSP: 2.0000 | Freq: Every day | NASAL | 1 refills | Status: DC

## 2018-08-28 MED ORDER — RAMIPRIL 5 MG PO CAPS: 5.0000 mg | ORAL_CAPSULE | Freq: Every day | ORAL | 1 refills | Status: DC

## 2018-08-28 NOTE — Assessment & Plan Note (Addendum)
Physical today 08/28/18.  Colonoscopy 05/2017.  Recommended f/u colonoscopy in 3 years.  PSA 12/16/17 - 1.39.

## 2018-08-28 NOTE — Progress Notes (Signed)
Patient ID: SAVEON PLANT, male   DOB: [birth year], 78 y.o.   MRN: [id number]   Subjective:    Patient ID: HIEU HERMS, male    DOB: 18-Jul-1941, 78 y.o.   MRN: [id number]  HPI  Patient here for his physical exam.  He reports he is doing well.  Feels good.  Staying active.  No chest pain.  No sob.  No acid reflux.  No abdominal pain.  Bowels moving.  No urine change.  Was having left knee pain.  S/p injection.  Helped.  Stays active.  Discussed shingrx.     Past Medical History:  Diagnosis Date  . Allergy   . Arthritis   . Coronary artery disease   . GERD (gastroesophageal reflux disease)   . History of hiatal hernia   . Hypercholesteremia   . Hypertension    Past Surgical History:  Procedure Laterality Date  . CARDIAC CATHETERIZATION    . cardiac stents    . CATARACT EXTRACTION W/PHACO Right 09/10/2017   Procedure: CATARACT EXTRACTION PHACO AND INTRAOCULAR LENS PLACEMENT (IOC);  Surgeon: Birder Robson, MD;  Location: [facility] ORS;  Service: Ophthalmology;  Laterality: Right;  [place] 01:01.7 AP% 15.3 CDE 9.38 Fluid pack lot # [id number]  . CATARACT EXTRACTION W/PHACO Left 10/02/2017   Procedure: CATARACT EXTRACTION PHACO AND INTRAOCULAR LENS PLACEMENT (IOC);  Surgeon: Birder Robson, MD;  Location: [facility] ORS;  Service: Ophthalmology;  Laterality: Left;  [place] 00:42.3 AP% 16.2 CDE 6.85 Fluid Pack Lot # [id number]  . COLONOSCOPY WITH PROPOFOL N/A 06/28/2017   Procedure: COLONOSCOPY WITH PROPOFOL;  Surgeon: Manya Silvas, MD;  Location: [facility] ENDOSCOPY;  Service: Endoscopy;  Laterality: N/A;  . CORONARY ANGIOPLASTY     STENTS X 2  . KNEE ARTHROSCOPY     Family History  Problem Relation Age of Onset  . Heart disease Mother   . Diabetes Mother   . Heart disease Father   . Diabetes Father    Social History   Socioeconomic History  . Marital status: Married    Spouse name: Not on file  . Number of children: Not on file  . Years of education: Not on file  . Highest education level:  Not on file  Occupational History  . Not on file  Social Needs  . Financial resource strain: Not hard at all  . Food insecurity:    Worry: Never true    Inability: Never true  . Transportation needs:    Medical: No    Non-medical: No  Tobacco Use  . Smoking status: Former Research scientist (life sciences)  . Smokeless tobacco: Never Used  Substance and Sexual Activity  . Alcohol use: No  . Drug use: No  . Sexual activity: Not on file  Lifestyle  . Physical activity:    Days per week: 5 days    Minutes per session: 30 min  . Stress: Not at all  Relationships  . Social connections:    Talks on phone: Not on file    Gets together: Not on file    Attends religious service: Not on file    Active member of club or organization: Not on file    Attends meetings of clubs or organizations: Not on file    Relationship status: Married  Other Topics Concern  . Not on file  Social History Narrative  . Not on file    Outpatient Encounter Medications as of 08/28/2018  Medication Sig  . Albuterol Sulfate (PROAIR RESPICLICK) 202 (90 Base) MCG/ACT AEPB Inhale  1-2 puffs into the lungs every 6 (six) hours as needed.  Marland Kitchen amLODipine (NORVASC) 5 MG tablet Take 1 tablet (5 mg total) by mouth daily.  Marland Kitchen aspirin EC 81 MG tablet Take 81 mg by mouth daily.   . calcium carbonate (OS-CAL - DOSED IN MG OF ELEMENTAL CALCIUM) 1250 (500 CA) MG tablet Take 1 tablet by mouth daily.   . cetirizine (ZYRTEC) 10 MG tablet TAKE ONE TABLET BY MOUTH DAILY  . CINNAMON PO Take 1,200 mg by mouth 2 times daily at 12 noon and 4 pm.  . diclofenac sodium (VOLTAREN) 1 % GEL Apply 2 g 2 (two) times daily topically. To affected joint.  . fluticasone (FLONASE) 50 MCG/ACT nasal spray Place 2 sprays into both nostrils daily.  Marland Kitchen GLUCOSAMINE-CHONDROITIN PO Take 1 capsule by mouth daily.  . Misc Natural Products (BLACK CHERRY CONCENTRATE PO) Take 1,000 mg daily by mouth.  . Omega-3 Fatty Acids (FISH OIL) 1000 MG CAPS Take 1,000 mg by mouth daily.   Marland Kitchen  omeprazole (PRILOSEC) 20 MG capsule Take 1 capsule (20 mg total) by mouth daily.  . ramipril (ALTACE) 5 MG capsule Take 1 capsule (5 mg total) by mouth daily.  . simvastatin (ZOCOR) 40 MG tablet TAKE ONE TABLET BY MOUTH DAILY  . [DISCONTINUED] amLODipine (NORVASC) 5 MG tablet Take 1 tablet (5 mg total) by mouth daily.  . [DISCONTINUED] fluticasone (FLONASE) 50 MCG/ACT nasal spray Place 2 sprays into both nostrils daily.  . [DISCONTINUED] omeprazole (PRILOSEC) 20 MG capsule Take 1 capsule (20 mg total) by mouth daily.  . [DISCONTINUED] ramipril (ALTACE) 5 MG capsule Take 1 capsule (5 mg total) by mouth daily.  . [DISCONTINUED] tadalafil (CIALIS) 5 MG tablet Take 5 mg daily as needed by mouth for erectile dysfunction.  . sildenafil (REVATIO) 20 MG tablet 1-2 daily as needed   No facility-administered encounter medications on file as of 08/28/2018.     Review of Systems  Constitutional: Negative for appetite change and unexpected weight change.  HENT: Negative for congestion and sinus pressure.   Eyes: Negative for pain and visual disturbance.  Respiratory: Negative for cough, chest tightness and shortness of breath.   Cardiovascular: Negative for chest pain, palpitations and leg swelling.  Gastrointestinal: Negative for abdominal pain, diarrhea and nausea.  Genitourinary: Negative for difficulty urinating and dysuria.  Musculoskeletal: Negative for joint swelling and myalgias.  Skin: Negative for color change and rash.  Neurological: Negative for dizziness, light-headedness and headaches.  Hematological: Negative for adenopathy. Does not bruise/bleed easily.  Psychiatric/Behavioral: Negative for agitation and dysphoric mood.       Objective:    Physical Exam Constitutional:      General: He is not in acute distress.    Appearance: Normal appearance. He is well-developed.  HENT:     Head: Normocephalic and atraumatic.     Nose: Nose normal. No congestion.     Mouth/Throat:      Pharynx: No oropharyngeal exudate or posterior oropharyngeal erythema.  Eyes:     General:        Right eye: No discharge.        Left eye: No discharge.     Conjunctiva/sclera: Conjunctivae normal.  Neck:     Musculoskeletal: Neck supple. No muscular tenderness.     Thyroid: No thyromegaly.  Cardiovascular:     Rate and Rhythm: Normal rate and regular rhythm.  Pulmonary:     Effort: No respiratory distress.     Breath sounds: Normal breath sounds. No  wheezing.  Abdominal:     General: Bowel sounds are normal.     Palpations: Abdomen is soft.     Tenderness: There is no abdominal tenderness.  Genitourinary:    Comments: Not performed.  Musculoskeletal:        General: No tenderness.  Lymphadenopathy:     Cervical: No cervical adenopathy.  Skin:    Findings: No erythema or rash.  Neurological:     Mental Status: He is alert and oriented to person, place, and time.  Psychiatric:        Mood and Affect: Mood normal.        Behavior: Behavior normal.     BP 116/78 (BP Location: Left Arm, Patient Position: Sitting, Cuff Size: Normal)   Pulse 84   Temp 98.4 F (36.9 C) (Oral)   Resp 16   Wt 153 lb 3.2 oz (69.5 kg)   SpO2 97%   BMI 24.73 kg/m  Wt Readings from Last 3 Encounters:  08/28/18 153 lb 3.2 oz (69.5 kg)  07/14/18 160 lb (72.6 kg)  06/24/18 160 lb 12.8 oz (72.9 kg)     Lab Results  Component Value Date   WBC 6.5 04/28/2018   HGB 14.6 04/28/2018   HCT 42.8 04/28/2018   PLT 292.0 04/28/2018   GLUCOSE 104 (H) 08/28/2018   CHOL 141 08/28/2018   TRIG 69.0 08/28/2018   HDL 43.40 08/28/2018   LDLCALC 84 08/28/2018   ALT 21 08/28/2018   AST 21 08/28/2018   NA 137 08/28/2018   K 4.4 08/28/2018   CL 102 08/28/2018   CREATININE 1.03 08/28/2018   BUN 15 08/28/2018   CO2 28 08/28/2018   TSH 1.57 12/16/2017   PSA 1.39 12/16/2017   HGBA1C 5.9 08/28/2018       Assessment & Plan:   Problem List Items Addressed This Visit    Acid reflux    Controlled on  prilosec.        Relevant Medications   omeprazole (PRILOSEC) 20 MG capsule   Adenomatous colon polyp    Colonoscopy 05/2017.  Recommended f/u in 3 years.        Arteriosclerosis of coronary artery    Has known CAD.  Followed by Dr Ubaldo Glassing.  Continue risk factor modification.  Doing well.        Relevant Medications   amLODipine (NORVASC) 5 MG tablet   ramipril (ALTACE) 5 MG capsule   sildenafil (REVATIO) 20 MG tablet   Benign essential HTN - Primary    Blood pressure under good control.  Continue same medication regimen.  Follow pressures.  Follow metabolic panel.        Relevant Medications   amLODipine (NORVASC) 5 MG tablet   ramipril (ALTACE) 5 MG capsule   sildenafil (REVATIO) 20 MG tablet   Other Relevant Orders   Basic metabolic panel (Completed)   Degenerative arthritis of left knee    S/p injection.  Doing better.        Healthcare maintenance    Physical today 08/28/18.  Colonoscopy 05/2017.  Recommended f/u colonoscopy in 3 years.  PSA 12/16/17 - 1.39.        HLD (hyperlipidemia)    On simvastatin.  Low cholesterol diet and exercise.  Follow lipid panel and liver function tests.        Relevant Medications   amLODipine (NORVASC) 5 MG tablet   ramipril (ALTACE) 5 MG capsule   sildenafil (REVATIO) 20 MG tablet   Other Relevant Orders  Hepatic function panel (Completed)   Lipid panel (Completed)   Hyperglycemia    Low carb diet and exercise.  Follow met b and a1c.        Relevant Orders   Hemoglobin A1c (Completed)       Einar Pheasant, MD

## 2018-08-31 ENCOUNTER — Encounter: Payer: Self-pay | Admitting: Internal Medicine

## 2018-08-31 NOTE — Assessment & Plan Note (Signed)
S/p injection  Doing better

## 2018-08-31 NOTE — Assessment & Plan Note (Signed)
Low carb diet and exercise.  Follow met b and a1c.   

## 2018-08-31 NOTE — Assessment & Plan Note (Signed)
On simvastatin.  Low cholesterol diet and exercise.  Follow lipid panel and liver function tests.   

## 2018-08-31 NOTE — Assessment & Plan Note (Signed)
Colonoscopy 05/2017.  Recommended f/u in 3 years.

## 2018-08-31 NOTE — Assessment & Plan Note (Signed)
Controlled on prilosec.   

## 2018-08-31 NOTE — Assessment & Plan Note (Signed)
Has known CAD.  Followed by Dr Ubaldo Glassing.  Continue risk factor modification.  Doing well.

## 2018-08-31 NOTE — Assessment & Plan Note (Signed)
Blood pressure under good control.  Continue same medication regimen.  Follow pressures.  Follow metabolic panel.   

## 2018-09-26 ENCOUNTER — Ambulatory Visit (INDEPENDENT_AMBULATORY_CARE_PROVIDER_SITE_OTHER): Payer: PPO | Admitting: Family Medicine

## 2018-09-26 ENCOUNTER — Encounter: Payer: Self-pay | Admitting: Family Medicine

## 2018-09-26 DIAGNOSIS — M1712 Unilateral primary osteoarthritis, left knee: Secondary | ICD-10-CM | POA: Diagnosis not present

## 2018-09-26 NOTE — Assessment & Plan Note (Signed)
Arthritic changes.  Discussed with patient a great length.  We can repeat injections as needed.  Patient did respond fairly well to Visco supplementation and they will to consider that again in the future.  Patient will follow-up with me again in 4 weeks.  We will get prior approval for Visco supplementation if needed.

## 2018-09-26 NOTE — Progress Notes (Signed)
[name] [address] [place], [place] [address] Phone: [phone] Subjective:    I James Peck am serving as a Education administrator for Dr. Hulan Saas.   CC: Left knee pain  JKD:[id number]     Updated 09/26/2018 James Peck is a 78 y.o. male coming in with complaint of left knee pain. States the knee isn't bothering him too much. Believes he may need a knee injection.  Known severe arthritic changes of the knee.  Patient has been doing relatively well overall.  Patient did notice recently more increasing swelling as well as some mild instability.  Has responded to steroid injections in 1 round of Visco supplementation previously.  Patient is to increase activity slowly but has been having to do a lot more work.     Past Medical History:  Diagnosis Date  . Allergy   . Arthritis   . Coronary artery disease   . GERD (gastroesophageal reflux disease)   . History of hiatal hernia   . Hypercholesteremia   . Hypertension    Past Surgical History:  Procedure Laterality Date  . CARDIAC CATHETERIZATION    . cardiac stents    . CATARACT EXTRACTION W/PHACO Right 09/10/2017   Procedure: CATARACT EXTRACTION PHACO AND INTRAOCULAR LENS PLACEMENT (IOC);  Surgeon: Birder Robson, MD;  Location: [facility] ORS;  Service: Ophthalmology;  Laterality: Right;  [place] 01:01.7 AP% 15.3 CDE 9.38 Fluid pack lot # [id number]  . CATARACT EXTRACTION W/PHACO Left 10/02/2017   Procedure: CATARACT EXTRACTION PHACO AND INTRAOCULAR LENS PLACEMENT (IOC);  Surgeon: Birder Robson, MD;  Location: [facility] ORS;  Service: Ophthalmology;  Laterality: Left;  [place] 00:42.3 AP% 16.2 CDE 6.85 Fluid Pack Lot # [id number]  . COLONOSCOPY WITH PROPOFOL N/A 06/28/2017   Procedure: COLONOSCOPY WITH PROPOFOL;  Surgeon: Manya Silvas, MD;  Location: [facility] ENDOSCOPY;  Service: Endoscopy;  Laterality: N/A;  . CORONARY ANGIOPLASTY     STENTS X 2  . KNEE ARTHROSCOPY     Social History   Socioeconomic History    . Marital status: Married    Spouse name: Not on file  . Number of children: Not on file  . Years of education: Not on file  . Highest education level: Not on file  Occupational History  . Not on file  Social Needs  . Financial resource strain: Not hard at all  . Food insecurity:    Worry: Never true    Inability: Never true  . Transportation needs:    Medical: No    Non-medical: No  Tobacco Use  . Smoking status: Former Research scientist (life sciences)  . Smokeless tobacco: Never Used  Substance and Sexual Activity  . Alcohol use: No  . Drug use: No  . Sexual activity: Not on file  Lifestyle  . Physical activity:    Days per week: 5 days    Minutes per session: 30 min  . Stress: Not at all  Relationships  . Social connections:    Talks on phone: Not on file    Gets together: Not on file    Attends religious service: Not on file    Active member of club or organization: Not on file    Attends meetings of clubs or organizations: Not on file    Relationship status: Married  Other Topics Concern  . Not on file  Social History Narrative  . Not on file   No Known Allergies Family History  Problem Relation Age of Onset  . Heart disease Mother   .  Diabetes Mother   . Heart disease Father   . Diabetes Father      Current Outpatient Medications (Cardiovascular):  .  amLODipine (NORVASC) 5 MG tablet, Take 1 tablet (5 mg total) by mouth daily. .  ramipril (ALTACE) 5 MG capsule, Take 1 capsule (5 mg total) by mouth daily. .  sildenafil (REVATIO) 20 MG tablet, 1-2 daily as needed .  simvastatin (ZOCOR) 40 MG tablet, TAKE ONE TABLET BY MOUTH DAILY  Current Outpatient Medications (Respiratory):  .  cetirizine (ZYRTEC) 10 MG tablet, TAKE ONE TABLET BY MOUTH DAILY .  fluticasone (FLONASE) 50 MCG/ACT nasal spray, Place 2 sprays into both nostrils daily.  Current Outpatient Medications (Analgesics):  .  aspirin EC 81 MG tablet, Take 81 mg by mouth daily.    Current Outpatient Medications  (Other):  .  calcium carbonate (OS-CAL - DOSED IN MG OF ELEMENTAL CALCIUM) 1250 (500 CA) MG tablet, Take 1 tablet by mouth daily.  Marland Kitchen  CINNAMON PO, Take 1,200 mg by mouth 2 times daily at 12 noon and 4 pm. .  diclofenac sodium (VOLTAREN) 1 % GEL, Apply 2 g 2 (two) times daily topically. To affected joint. Marland Kitchen  GLUCOSAMINE-CHONDROITIN PO, Take 1 capsule by mouth daily. .  Misc Natural Products (BLACK CHERRY CONCENTRATE PO), Take 1,000 mg daily by mouth. .  Omega-3 Fatty Acids (FISH OIL) 1000 MG CAPS, Take 1,000 mg by mouth daily.  Marland Kitchen  omeprazole (PRILOSEC) 20 MG capsule, Take 1 capsule (20 mg total) by mouth daily.    Past medical history, social, surgical and family history all reviewed in electronic medical record.  No pertanent information unless stated regarding to the chief complaint.   Review of Systems:  No headache, visual changes, nausea, vomiting, diarrhea, constipation, dizziness, abdominal pain, skin rash, fevers, chills, night sweats, weight loss, swollen lymph nodes, body aches, , chest pain, shortness of breath, mood changes.  Muscle aches and joint swelling  Objective  Blood pressure 126/70, pulse 61, height 5\' 6"  (1.676 m), weight 157 lb (71.2 kg), SpO2 97 %.   General: No apparent distress alert and oriented x3 mood and affect normal, dressed appropriately.  HEENT: Pupils equal, extraocular movements intact  Respiratory: Patient's speak in full sentences and does not appear short of breath  Cardiovascular: No lower extremity edema, non tender, no erythema  Skin: Warm dry intact with no signs of infection or rash on extremities or on axial skeleton.  Abdomen: Soft nontender  Neuro: Cranial nerves II through XII are intact, neurovascularly intact in all extremities with 2+ DTRs and 2+ pulses.  Lymph: No lymphadenopathy of posterior or anterior cervical chain or axillae bilaterally.  Gait antalgic gait MSK:  Non tender with full range of motion and good stability and symmetric  strength and tone of shoulders, elbows, wrist, hip and ankles bilaterally.  Knee: Left valgus deformity noted. Large thigh to calf ratio.  Tender to palpation over medial and PF joint line.  ROM full in flexion and extension and lower leg rotation. instability with valgus force.  painful patellar compression. Patellar glide with moderate crepitus. Patellar and quadriceps tendons unremarkable. Hamstring and quadriceps strength is normal. Contralateral knee shows arthritic changes   After informed written and verbal consent, patient was seated on exam table. Left knee was prepped with alcohol swab and utilizing anterolateral approach, patient's left knee space was injected with 4:1  marcaine 0.5%: Kenalog 40mg /dL. Patient tolerated the procedure well without immediate complications. Impression and Recommendations:     This  case required medical decision making of moderate complexity. The above documentation has been reviewed and is accurate and complete Lyndal Pulley, DO       Note: This dictation was prepared with Dragon dictation along with smaller phrase technology. Any transcriptional errors that result from this process are unintentional.

## 2018-09-26 NOTE — Patient Instructions (Signed)
God to see you  James Peck is your friend Stay active See me again in 4 weeks and see if we need the monovisc again

## 2018-10-20 ENCOUNTER — Ambulatory Visit: Payer: PPO | Admitting: Family Medicine

## 2018-11-17 ENCOUNTER — Other Ambulatory Visit: Payer: Self-pay | Admitting: Internal Medicine

## 2018-12-01 ENCOUNTER — Other Ambulatory Visit: Payer: Self-pay | Admitting: Internal Medicine

## 2019-01-12 ENCOUNTER — Other Ambulatory Visit: Payer: Self-pay | Admitting: Internal Medicine

## 2019-01-28 ENCOUNTER — Ambulatory Visit: Payer: Self-pay

## 2019-01-28 ENCOUNTER — Telehealth: Payer: Self-pay

## 2019-01-28 NOTE — Telephone Encounter (Signed)
Called to triage patient and to schedule a virtual visit.  No answer.  LMTCB.

## 2019-01-28 NOTE — Telephone Encounter (Signed)
Patient called and says he woke up in the middle of the night last night with a sore throat. He says he notices it when he swallows. He denies fever, says he checked and it was 97.6. He denies viral symptoms. He says his wife looked at his throat and she saw a white patch. He denies any other symptoms. He asked should he get tested because he travels a lot to different counties. I advised someone from the office will call tomorrow to discuss a virtual appointment, care advise given, patient verbalized understanding.  Reason for Disposition . [1] Sore throat is the only symptom AND [2] present > 48 hours  Answer Assessment - Initial Assessment Questions 1. ONSET: "When did the throat start hurting?" (Hours or days ago)      Last night 2. SEVERITY: "How bad is the sore throat?" (Scale 1-10; mild, moderate or severe)   - MILD (1-3):  doesn't interfere with eating or normal activities   - MODERATE (4-7): interferes with eating some solids and normal activities   - SEVERE (8-10):  excruciating pain, interferes with most normal activities   - SEVERE DYSPHAGIA: can't swallow liquids, drooling     Mild 3. STREP EXPOSURE: "Has there been any exposure to strep within the past week?" If so, ask: "What type of contact occurred?"      No 4.  VIRAL SYMPTOMS: "Are there any symptoms of a cold, such as a runny nose, cough, hoarse voice or red eyes?"      No 5. FEVER: "Do you have a fever?" If so, ask: "What is your temperature, how was it measured, and when did it start?"     No 6. PUS ON THE TONSILS: "Is there pus on the tonsils in the back of your throat?"     No 7. OTHER SYMPTOMS: "Do you have any other symptoms?" (e.g., difficulty breathing, headache, rash)     No 8. PREGNANCY: "Is there any chance you are pregnant?" "When was your last menstrual period?"     N/A  Protocols used: SORE THROAT-A-AH

## 2019-01-28 NOTE — Telephone Encounter (Signed)
  Copied from [place] [id number]. Topic: Appointment Scheduling - Scheduling Inquiry for Clinic >> Jan 28, 2019  3:23 PM Pauline Good wrote: Reason for CRM: pt has sorethroat and want to be tested for covid

## 2019-01-29 ENCOUNTER — Other Ambulatory Visit: Payer: Self-pay

## 2019-01-29 ENCOUNTER — Ambulatory Visit (INDEPENDENT_AMBULATORY_CARE_PROVIDER_SITE_OTHER): Payer: PPO | Admitting: Family Medicine

## 2019-01-29 ENCOUNTER — Other Ambulatory Visit: Payer: PPO

## 2019-01-29 ENCOUNTER — Telehealth: Payer: Self-pay | Admitting: General Practice

## 2019-01-29 DIAGNOSIS — Z20828 Contact with and (suspected) exposure to other viral communicable diseases: Secondary | ICD-10-CM | POA: Diagnosis not present

## 2019-01-29 DIAGNOSIS — R6889 Other general symptoms and signs: Secondary | ICD-10-CM | POA: Diagnosis not present

## 2019-01-29 DIAGNOSIS — R05 Cough: Secondary | ICD-10-CM

## 2019-01-29 DIAGNOSIS — Z20822 Contact with and (suspected) exposure to covid-19: Secondary | ICD-10-CM

## 2019-01-29 DIAGNOSIS — R059 Cough, unspecified: Secondary | ICD-10-CM

## 2019-01-29 DIAGNOSIS — J029 Acute pharyngitis, unspecified: Secondary | ICD-10-CM | POA: Diagnosis not present

## 2019-01-29 MED ORDER — AMOXICILLIN 500 MG PO CAPS: 500.0000 mg | ORAL_CAPSULE | Freq: Two times a day (BID) | ORAL | 0 refills | Status: DC

## 2019-01-29 NOTE — Progress Notes (Signed)
Patient ID: James Peck, male   DOB: 02-Sep-1940, 78 y.o.   MRN: [id number]    Virtual Visit via video/phone Note  This visit type was conducted due to national recommendations for restrictions regarding the COVID-19 pandemic (e.g. social distancing).  This format is felt to be most appropriate for this patient at this time.  All issues noted in this document were discussed and addressed.  No physical exam was performed (except for noted visual exam findings with Video Visits).   I connected with James Peck today at  9:20 AM EDT by a video enabled telemedicine application or telephone and verified that I am speaking with the correct person using two identifiers. Location patient: home Location provider: work or home office Persons participating in the virtual visit: patient, provider  I discussed the limitations, risks, security and privacy concerns of performing an evaluation and management service by telephone and the availability of in person appointments. I also discussed with the patient that there may be a patient responsible charge related to this service. The patient expressed understanding and agreed to proceed.  Interactive audio and video telecommunications were attempted between this provider and patient, however failed, due to patient having technical difficulties  We continued and completed visit with audio only after 3 failed video connection attempts  HPI:  Patient and I connected via telephone after 3 failed video connection attempts to discuss sore throat, white spots in back of throat and cough.  Patient states he has had these symptoms for 2 to 3 days.  Denies fever or chills.  Denies shortness of breath or wheezing.  Denies chest pain.  Denies GI or GU complaints.  Denies any known exposure to someone who is under investigation for or requests a positive for COVID-19.  Patient states he has gone to the grocery store in the gas station, but otherwise does not go many places.   Patient has been wearing a mask and washing his hands.  ROS: See pertinent positives and negatives per HPI.  Past Medical History:  Diagnosis Date  . Allergy   . Arthritis   . Coronary artery disease   . GERD (gastroesophageal reflux disease)   . History of hiatal hernia   . Hypercholesteremia   . Hypertension     Past Surgical History:  Procedure Laterality Date  . CARDIAC CATHETERIZATION    . cardiac stents    . CATARACT EXTRACTION W/PHACO Right 09/10/2017   Procedure: CATARACT EXTRACTION PHACO AND INTRAOCULAR LENS PLACEMENT (IOC);  Surgeon: Birder Robson, MD;  Location: [facility] ORS;  Service: Ophthalmology;  Laterality: Right;  [place] 01:01.7 AP% 15.3 CDE 9.38 Fluid pack lot # [id number]  . CATARACT EXTRACTION W/PHACO Left 10/02/2017   Procedure: CATARACT EXTRACTION PHACO AND INTRAOCULAR LENS PLACEMENT (IOC);  Surgeon: Birder Robson, MD;  Location: [facility] ORS;  Service: Ophthalmology;  Laterality: Left;  [place] 00:42.3 AP% 16.2 CDE 6.85 Fluid Pack Lot # [id number]  . COLONOSCOPY WITH PROPOFOL N/A 06/28/2017   Procedure: COLONOSCOPY WITH PROPOFOL;  Surgeon: Manya Silvas, MD;  Location: [facility] ENDOSCOPY;  Service: Endoscopy;  Laterality: N/A;  . CORONARY ANGIOPLASTY     STENTS X 2  . KNEE ARTHROSCOPY      Family History  Problem Relation Age of Onset  . Heart disease Mother   . Diabetes Mother   . Heart disease Father   . Diabetes Father    Social History   Tobacco Use  . Smoking status: Former Research scientist (life sciences)  . Smokeless tobacco: Never Used  Substance Use Topics  . Alcohol use: No    Current Outpatient Medications:  .  amLODipine (NORVASC) 5 MG tablet, Take 1 tablet (5 mg total) by mouth daily., Disp: 90 tablet, Rfl: 1 .  aspirin EC 81 MG tablet, Take 81 mg by mouth daily. , Disp: , Rfl:  .  calcium carbonate (OS-CAL - DOSED IN MG OF ELEMENTAL CALCIUM) 1250 (500 CA) MG tablet, Take 1 tablet by mouth daily. , Disp: , Rfl:  .  cetirizine (ZYRTEC) 10 MG tablet, TAKE ONE TABLET  EVERY DAY, Disp: 90 tablet, Rfl: 1 .  CINNAMON PO, Take 1,200 mg by mouth 2 times daily at 12 noon and 4 pm., Disp: , Rfl:  .  diclofenac sodium (VOLTAREN) 1 % GEL, Apply 2 g 2 (two) times daily topically. To affected joint., Disp: 300 g, Rfl: 11 .  fluticasone (FLONASE) 50 MCG/ACT nasal spray, TAKE 2 SPRAYS INTO BOTH NOSTRIL DAILY, Disp: 16 g, Rfl: 1 .  GLUCOSAMINE-CHONDROITIN PO, Take 1 capsule by mouth daily., Disp: , Rfl:  .  Misc Natural Products (BLACK CHERRY CONCENTRATE PO), Take 1,000 mg daily by mouth., Disp: , Rfl:  .  Omega-3 Fatty Acids (FISH OIL) 1000 MG CAPS, Take 1,000 mg by mouth daily. , Disp: , Rfl:  .  omeprazole (PRILOSEC) 20 MG capsule, Take 1 capsule (20 mg total) by mouth daily., Disp: 90 capsule, Rfl: 1 .  ramipril (ALTACE) 5 MG capsule, Take 1 capsule (5 mg total) by mouth daily., Disp: 90 capsule, Rfl: 1 .  sildenafil (REVATIO) 20 MG tablet, TAKE 1 TO 2 TABLETS DAILY AS NEEDED, Disp: 60 tablet, Rfl: 0 .  simvastatin (ZOCOR) 40 MG tablet, TAKE ONE TABLET BY MOUTH DAILY, Disp: 90 tablet, Rfl: 1  EXAM:  GENERAL: alert, oriented, sounds well and in no acute distress  LUNGS: speaking in full sentences, no signs of respiratory distress, breathing rate appears normal, no obvious gross SOB, gasping or wheezing  PSYCH/NEURO: pleasant and cooperative, no obvious depression or anxiety, speech and thought processing grossly intact  ASSESSMENT AND PLAN:  Discussed the following assessment and plan:  Suspect COVID-19, sore throat, cough, exudative pharyngitis - due to patient symptoms of sore throat, cough we will get him set up for COVID-19 testing.  We will also cover him with antibiotic course to treat for possible throat infection. Patient advised that a nurse will be calling to give time and location to drive up for drive-through testing.  Patient advised that testing is taking 2 to 7 days to result, and while we are awaiting results patient must remain under self quarantine  and monitor for any changing/worsening symptoms.  Advised over-the-counter medications such as Tylenol can be used to help treat pain or fevers, Robitussin can be used to help calm cough, allergy medication such as Claritin or Allegra can help reduce congestion.  Also discussed getting plenty of rest and increasing fluid intake.  Made patient aware that test results as well as how his symptoms progress will determine when the self quarantine will be able to end.  Also advised to monitor self for any worsening symptoms, advised if severe shortness of breath develops, high fever that is not reduced with use of Tylenol, chest pain, severe vomiting or diarrhea  --- patient must call on-call and or go to ER right away for evaluation. Patient verbalized understanding of these instructions.   I discussed the assessment and treatment plan with the patient. The patient was provided an opportunity to ask questions  and all were answered. The patient agreed with the plan and demonstrated an understanding of the instructions.   The patient was advised to call back or seek an in-person evaluation if the symptoms worsen or if the condition fails to improve as anticipated.  I provided 15 minutes of non-face-to-face time during this encounter.   Jodelle Green, FNP

## 2019-01-29 NOTE — Telephone Encounter (Signed)
Called and spoke to patient.  Patient c/o a sore throat with white patches and a slight headache this morning.  Denied having a fever.  No other symptoms.  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    Informed pt that PCP does not have any available appointments for today.  Scheduled patient a virtual visit with Philis Nettle, FNP today at 9:20 am.

## 2019-01-29 NOTE — Telephone Encounter (Signed)
Pt has been scheduled for covid testing Pt was referred by :Jodelle Green, FNP

## 2019-01-29 NOTE — Telephone Encounter (Signed)
Pt saw James Peck

## 2019-01-29 NOTE — Telephone Encounter (Signed)
FYI Called and spoke to patient.  Patient c/o a sore throat with white patches and a slight headache this morning.  Denied having a fever.  No other symptoms.  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    Informed pt that PCP does not have any available appointments for today.  Scheduled patient a virtual visit with Philis Nettle, FNP today at 9:20 am

## 2019-01-29 NOTE — Telephone Encounter (Signed)
James Peck  09-14-1940  [id number]  Needs covid test: cough, sore throat  [facility]

## 2019-02-04 LAB — NOVEL CORONAVIRUS, NAA: SARS-CoV-2, NAA: NOT DETECTED

## 2019-02-10 ENCOUNTER — Other Ambulatory Visit: Payer: Self-pay | Admitting: Internal Medicine

## 2019-02-27 ENCOUNTER — Encounter: Payer: Self-pay | Admitting: Internal Medicine

## 2019-02-27 ENCOUNTER — Other Ambulatory Visit: Payer: Self-pay

## 2019-02-27 ENCOUNTER — Ambulatory Visit (INDEPENDENT_AMBULATORY_CARE_PROVIDER_SITE_OTHER): Payer: PPO | Admitting: Internal Medicine

## 2019-02-27 DIAGNOSIS — I2510 Atherosclerotic heart disease of native coronary artery without angina pectoris: Secondary | ICD-10-CM

## 2019-02-27 DIAGNOSIS — R739 Hyperglycemia, unspecified: Secondary | ICD-10-CM

## 2019-02-27 DIAGNOSIS — I10 Essential (primary) hypertension: Secondary | ICD-10-CM | POA: Diagnosis not present

## 2019-02-27 DIAGNOSIS — E785 Hyperlipidemia, unspecified: Secondary | ICD-10-CM | POA: Diagnosis not present

## 2019-02-27 DIAGNOSIS — K219 Gastro-esophageal reflux disease without esophagitis: Secondary | ICD-10-CM | POA: Diagnosis not present

## 2019-02-27 MED ORDER — FLUTICASONE PROPIONATE 50 MCG/ACT NA SUSP: 2.0000 | Freq: Every day | NASAL | 3 refills | Status: DC

## 2019-02-27 NOTE — Progress Notes (Signed)
Patient ID: James Peck, male   DOB: [birth year], 78 y.o.   MRN: [id number]   Virtual Visit via telephone Note  This visit type was conducted due to national recommendations for restrictions regarding the COVID-19 pandemic (e.g. social distancing).  This format is felt to be most appropriate for this patient at this time.  All issues noted in this document were discussed and addressed.  No physical exam was performed (except for noted visual exam findings with Video Visits).   I connected with James Peck today by telephone and verified that I am speaking with the correct person using two identifiers. Location patient: home Location provider: work  Persons participating in the telephone visit: patient, provider  I discussed the limitations, risks, security and privacy concerns of performing an evaluation and management service by telephone and the availability of in person appointments. The patient expressed understanding and agreed to proceed.   Reason for visit: scheduled follow up   HPI: He reports he is doing well.  Feels good. Staying active.  No chest pain.  No sob.  No acid reflux.  No abdominal pain.  Bowels moving.  prilosec controlling acid reflux.  Blood pressure doing well.  Sees Dr Ubaldo Glassing for f/u heart issues. Stable and asymptomatic.  Trying to stay in due to [place] exposure.  No fever.  No chest congestion or cough.     ROS: See pertinent positives and negatives per HPI.  Past Medical History:  Diagnosis Date   Allergy    Arthritis    Coronary artery disease    GERD (gastroesophageal reflux disease)    History of hiatal hernia    Hypercholesteremia    Hypertension     Past Surgical History:  Procedure Laterality Date   CARDIAC CATHETERIZATION     cardiac stents     CATARACT EXTRACTION W/PHACO Right 09/10/2017   Procedure: CATARACT EXTRACTION PHACO AND INTRAOCULAR LENS PLACEMENT ([place]);  Surgeon: Birder Robson, MD;  Location: [facility] ORS;  Service:  Ophthalmology;  Laterality: Right;  [place] 01:01.7 AP% 15.3 CDE 9.38 Fluid pack lot # [id number]   CATARACT EXTRACTION W/PHACO Left 10/02/2017   Procedure: CATARACT EXTRACTION PHACO AND INTRAOCULAR LENS PLACEMENT (IOC);  Surgeon: Birder Robson, MD;  Location: [facility] ORS;  Service: Ophthalmology;  Laterality: Left;  [place] 00:42.3 AP% 16.2 CDE 6.85 Fluid Pack Lot # [id number]   COLONOSCOPY WITH PROPOFOL N/A 06/28/2017   Procedure: COLONOSCOPY WITH PROPOFOL;  Surgeon: Manya Silvas, MD;  Location: [facility] ENDOSCOPY;  Service: Endoscopy;  Laterality: N/A;   CORONARY ANGIOPLASTY     STENTS X 2   KNEE ARTHROSCOPY      Family History  Problem Relation Age of Onset   Heart disease Mother    Diabetes Mother    Heart disease Father    Diabetes Father     SOCIAL HX: reviewed.    Current Outpatient Medications:    amLODipine (NORVASC) 5 MG tablet, TAKE ONE TABLET BY MOUTH DAILY, Disp: 90 tablet, Rfl: 1   aspirin EC 81 MG tablet, Take 81 mg by mouth daily. , Disp: , Rfl:    calcium carbonate (OS-CAL - DOSED IN MG OF ELEMENTAL CALCIUM) 1250 (500 CA) MG tablet, Take 1 tablet by mouth daily. , Disp: , Rfl:    cetirizine (ZYRTEC) 10 MG tablet, TAKE ONE TABLET EVERY DAY, Disp: 90 tablet, Rfl: 1   CINNAMON PO, Take 1,200 mg by mouth 2 times daily at 12 noon and 4 pm., Disp: , Rfl:    diclofenac  sodium (VOLTAREN) 1 % GEL, Apply 2 g 2 (two) times daily topically. To affected joint., Disp: 300 g, Rfl: 11   fluticasone (FLONASE) 50 MCG/ACT nasal spray, Place 2 sprays into both nostrils daily., Disp: 48 g, Rfl: 3   GLUCOSAMINE-CHONDROITIN PO, Take 1 capsule by mouth daily., Disp: , Rfl:    Misc Natural Products (BLACK CHERRY CONCENTRATE PO), Take 1,000 mg daily by mouth., Disp: , Rfl:    Omega-3 Fatty Acids (FISH OIL) 1000 MG CAPS, Take 1,000 mg by mouth daily. , Disp: , Rfl:    omeprazole (PRILOSEC) 20 MG capsule, Take 1 capsule (20 mg total) by mouth daily., Disp: 90 capsule, Rfl: 1    ramipril (ALTACE) 5 MG capsule, Take 1 capsule (5 mg total) by mouth daily., Disp: 90 capsule, Rfl: 1   sildenafil (REVATIO) 20 MG tablet, TAKE 1 TO 2 TABLETS DAILY AS NEEDED, Disp: 60 tablet, Rfl: 0   simvastatin (ZOCOR) 40 MG tablet, TAKE ONE TABLET BY MOUTH DAILY, Disp: 90 tablet, Rfl: 1   amoxicillin (AMOXIL) 500 MG capsule, Take 1 capsule (500 mg total) by mouth 2 (two) times daily., Disp: 20 capsule, Rfl: 0  EXAM:  VITALS per patient if applicable: 505/39, 67 weight 151 pounds.    GENERAL: alert.  Sounds to be in no acute distress.  Answering questions appropriately.    PSYCH/NEURO: pleasant and cooperative, no obvious depression or anxiety, speech and thought processing grossly intact  ASSESSMENT AND PLAN:  Discussed the following assessment and plan:  Acid reflux Controlled on prilosec.    Benign essential HTN Blood pressure under good control.  Continue current medications.  Follow pressures.  Follow metabolic panel.   CAD in native artery Followed by Dr Ubaldo Glassing.  Continue risk factor modification.  Currently asymptomatic.    HLD (hyperlipidemia) On simvastatin.  Low cholesterol diet and exercise. Follow lipid panel and liver function tests.    Hyperglycemia Low carb diet and exercise.  Follow metabolic panel and J6B.      I discussed the assessment and treatment plan with the patient. The patient was provided an opportunity to ask questions and all were answered. The patient agreed with the plan and demonstrated an understanding of the instructions.   The patient was advised to call back or seek an in-person evaluation if the symptoms worsen or if the condition fails to improve as anticipated.  I provided 12 minutes of non-face-to-face time during this encounter.   Einar Pheasant, MD

## 2019-02-27 NOTE — Progress Notes (Signed)
Requesting refills on Flonase to be for 3 months.

## 2019-02-28 ENCOUNTER — Encounter: Payer: Self-pay | Admitting: Internal Medicine

## 2019-02-28 NOTE — Assessment & Plan Note (Signed)
Followed by Dr Ubaldo Glassing.  Continue risk factor modification.  Currently asymptomatic.

## 2019-02-28 NOTE — Assessment & Plan Note (Signed)
Blood pressure under good control.  Continue current medications.  Follow pressures.  Follow metabolic panel.

## 2019-02-28 NOTE — Assessment & Plan Note (Signed)
Low carb diet and exercise.  Follow metabolic panel and Y8F.

## 2019-02-28 NOTE — Assessment & Plan Note (Signed)
Controlled on prilosec.   

## 2019-02-28 NOTE — Assessment & Plan Note (Signed)
On simvastatin.  Low cholesterol diet and exercise.  Follow lipid panel and liver function tests.   

## 2019-03-09 ENCOUNTER — Other Ambulatory Visit: Payer: Self-pay | Admitting: Internal Medicine

## 2019-03-19 ENCOUNTER — Other Ambulatory Visit: Payer: Self-pay

## 2019-03-19 ENCOUNTER — Other Ambulatory Visit (INDEPENDENT_AMBULATORY_CARE_PROVIDER_SITE_OTHER): Payer: PPO

## 2019-03-19 DIAGNOSIS — I10 Essential (primary) hypertension: Secondary | ICD-10-CM

## 2019-03-19 DIAGNOSIS — R739 Hyperglycemia, unspecified: Secondary | ICD-10-CM | POA: Diagnosis not present

## 2019-03-19 DIAGNOSIS — E785 Hyperlipidemia, unspecified: Secondary | ICD-10-CM | POA: Diagnosis not present

## 2019-03-19 LAB — HEPATIC FUNCTION PANEL
ALT: 17 U/L (ref 0–53)
AST: 19 U/L (ref 0–37)
Albumin: 4.7 g/dL (ref 3.5–5.2)
Alkaline Phosphatase: 63 U/L (ref 39–117)
Bilirubin, Direct: 0.1 mg/dL (ref 0.0–0.3)
Total Bilirubin: 0.7 mg/dL (ref 0.2–1.2)
Total Protein: 6.7 g/dL (ref 6.0–8.3)

## 2019-03-19 LAB — LIPID PANEL
Cholesterol: 162 mg/dL (ref 0–200)
HDL: 42.5 mg/dL (ref >39.00)
LDL Cholesterol: 91 mg/dL (ref 0–99)
NonHDL: 119.12
Total CHOL/HDL Ratio: 4
Triglycerides: 139 mg/dL (ref 0.0–149.0)
VLDL: 27.8 mg/dL (ref 0.0–40.0)

## 2019-03-19 LAB — BASIC METABOLIC PANEL
BUN: 14 mg/dL (ref 6–23)
CO2: 25 mEq/L (ref 19–32)
Calcium: 9.7 mg/dL (ref 8.4–10.5)
Chloride: 102 mEq/L (ref 96–112)
Creatinine, Ser: 0.94 mg/dL (ref 0.40–1.50)
GFR: 77.71 mL/min (ref >60.00)
Glucose, Bld: 102 mg/dL — ABNORMAL HIGH (ref 70–99)
Potassium: 4.4 mEq/L (ref 3.5–5.1)
Sodium: 139 mEq/L (ref 135–145)

## 2019-03-19 LAB — HEMOGLOBIN A1C: Hgb A1c MFr Bld: 6.1 % (ref 4.6–6.5)

## 2019-03-19 LAB — TSH: TSH: 2.26 u[IU]/mL (ref 0.35–4.50)

## 2019-03-20 ENCOUNTER — Encounter: Payer: Self-pay | Admitting: Internal Medicine

## 2019-04-06 ENCOUNTER — Other Ambulatory Visit: Payer: Self-pay | Admitting: Internal Medicine

## 2019-05-04 DIAGNOSIS — D3131 Benign neoplasm of right choroid: Secondary | ICD-10-CM | POA: Diagnosis not present

## 2019-05-25 ENCOUNTER — Other Ambulatory Visit: Payer: Self-pay | Admitting: Internal Medicine

## 2019-06-30 ENCOUNTER — Other Ambulatory Visit: Payer: Self-pay

## 2019-06-30 ENCOUNTER — Encounter: Payer: Self-pay | Admitting: Internal Medicine

## 2019-06-30 ENCOUNTER — Ambulatory Visit (INDEPENDENT_AMBULATORY_CARE_PROVIDER_SITE_OTHER): Payer: PPO

## 2019-06-30 ENCOUNTER — Ambulatory Visit (INDEPENDENT_AMBULATORY_CARE_PROVIDER_SITE_OTHER): Payer: PPO | Admitting: Internal Medicine

## 2019-06-30 VITALS — BP 120/62 | HR 69 | Ht 66.0 in | Wt 149.8 lb

## 2019-06-30 VITALS — BP 120/62 | HR 69 | Wt 149.8 lb

## 2019-06-30 DIAGNOSIS — Z0000 Encounter for general adult medical examination without abnormal findings: Secondary | ICD-10-CM | POA: Diagnosis not present

## 2019-06-30 DIAGNOSIS — R05 Cough: Secondary | ICD-10-CM | POA: Diagnosis not present

## 2019-06-30 DIAGNOSIS — M1712 Unilateral primary osteoarthritis, left knee: Secondary | ICD-10-CM | POA: Diagnosis not present

## 2019-06-30 DIAGNOSIS — R739 Hyperglycemia, unspecified: Secondary | ICD-10-CM | POA: Diagnosis not present

## 2019-06-30 DIAGNOSIS — R053 Chronic cough: Secondary | ICD-10-CM | POA: Insufficient documentation

## 2019-06-30 DIAGNOSIS — K219 Gastro-esophageal reflux disease without esophagitis: Secondary | ICD-10-CM

## 2019-06-30 DIAGNOSIS — I2510 Atherosclerotic heart disease of native coronary artery without angina pectoris: Secondary | ICD-10-CM

## 2019-06-30 DIAGNOSIS — R059 Cough, unspecified: Secondary | ICD-10-CM

## 2019-06-30 DIAGNOSIS — I10 Essential (primary) hypertension: Secondary | ICD-10-CM

## 2019-06-30 DIAGNOSIS — Z125 Encounter for screening for malignant neoplasm of prostate: Secondary | ICD-10-CM | POA: Diagnosis not present

## 2019-06-30 DIAGNOSIS — E785 Hyperlipidemia, unspecified: Secondary | ICD-10-CM | POA: Diagnosis not present

## 2019-06-30 NOTE — Assessment & Plan Note (Signed)
On simvastatin.  Low cholesterol diet and exercise.  Follow lipid panel and liver function tests.   

## 2019-06-30 NOTE — Assessment & Plan Note (Signed)
Has known CAD.  Was seeing Dr Ubaldo Glassing.  States has been released from regular f/u.  Currently doing well.  Continue risk factor modification.

## 2019-06-30 NOTE — Assessment & Plan Note (Signed)
Some increased pain as outlined.  Plans to f/u with Dr Tamala Julian.  Responded well to injections previously.

## 2019-06-30 NOTE — Assessment & Plan Note (Signed)
Persistent cough as outlined.  Worse in am.  Some increased drainage.  Acid reflux controlled.  Recheck cxr.  flonase and saline nasal spray as directed.  Follow.

## 2019-06-30 NOTE — Patient Instructions (Addendum)
  James Peck , Thank you for taking time to come for your Medicare Wellness Visit. I appreciate your ongoing commitment to your health goals. Please review the following plan we discussed and let me know if I can assist you in the future.   These are the goals we discussed: Goals      Patient Stated   . Weight (lb) < 155 lb (70.3 kg) (pt-stated)       This is a list of the screening recommended for you and due dates:  Health Maintenance  Topic Date Due  . Tetanus Vaccine  03/12/2023  . Flu Shot  Completed  . Pneumonia vaccines  Discontinued

## 2019-06-30 NOTE — Progress Notes (Addendum)
Subjective:   James Peck is a 78 y.o. male who presents for Medicare Annual/Subsequent preventive examination.  Review of Systems:  No ROS.  Medicare Wellness Virtual Visit.  Visual/audio telehealth visit, vital signs provided by patient. See social history for additional risk factors.   Cardiac Risk Factors include: advanced age (>59men, >87 women);male gender;hypertension     Objective:    Vitals: BP 120/62 (BP Location: Left Arm, Patient Position: Sitting, Cuff Size: Normal)   Pulse 69   Wt 149 lb 12.8 oz (67.9 kg)   BMI 24.18 kg/m   Body mass index is 24.18 kg/m.  Advanced Directives 06/30/2019 06/24/2018 05/27/2018 10/02/2017  Does Patient Have a Medical Advance Directive? Yes Yes Yes Yes  Type of Advance Directive Living will Healthcare Power of [place] will  Does patient want to make changes to medical advance directive? No - Patient declined No - Patient declined - No - Patient declined  Copy of [place] in Chart? - No - copy requested - -    Tobacco Social History   Tobacco Use  Smoking Status Former Smoker  Smokeless Tobacco Never Used     Counseling given: Not Answered   Clinical Intake:  Pre-visit preparation completed: Yes        Diabetes: No  How often do you need to have someone help you when you read instructions, pamphlets, or other written materials from your doctor or pharmacy?: 1 - Never  Interpreter Needed?: No     Past Medical History:  Diagnosis Date  . Allergy   . Arthritis   . Coronary artery disease   . GERD (gastroesophageal reflux disease)   . History of hiatal hernia   . Hypercholesteremia   . Hypertension    Past Surgical History:  Procedure Laterality Date  . CARDIAC CATHETERIZATION    . cardiac stents    . CATARACT EXTRACTION W/PHACO Right 09/10/2017   Procedure: CATARACT EXTRACTION PHACO AND INTRAOCULAR LENS PLACEMENT (IOC);  Surgeon: Birder Robson, MD;   Location: [facility] ORS;  Service: Ophthalmology;  Laterality: Right;  [place] 01:01.7 AP% 15.3 CDE 9.38 Fluid pack lot # [id number]  . CATARACT EXTRACTION W/PHACO Left 10/02/2017   Procedure: CATARACT EXTRACTION PHACO AND INTRAOCULAR LENS PLACEMENT (IOC);  Surgeon: Birder Robson, MD;  Location: [facility] ORS;  Service: Ophthalmology;  Laterality: Left;  [place] 00:42.3 AP% 16.2 CDE 6.85 Fluid Pack Lot # [id number]  . COLONOSCOPY WITH PROPOFOL N/A 06/28/2017   Procedure: COLONOSCOPY WITH PROPOFOL;  Surgeon: Manya Silvas, MD;  Location: [facility] ENDOSCOPY;  Service: Endoscopy;  Laterality: N/A;  . CORONARY ANGIOPLASTY     STENTS X 2  . KNEE ARTHROSCOPY     Family History  Problem Relation Age of Onset  . Heart disease Mother   . Diabetes Mother   . Heart disease Father   . Diabetes Father    Social History   Socioeconomic History  . Marital status: Married    Spouse name: Not on file  . Number of children: Not on file  . Years of education: Not on file  . Highest education level: Not on file  Occupational History  . Not on file  Social Needs  . Financial resource strain: Not hard at all  . Food insecurity    Worry: Never true    Inability: Never true  . Transportation needs    Medical: No    Non-medical: No  Tobacco Use  . Smoking status: Former Research scientist (life sciences)  .  Smokeless tobacco: Never Used  Substance and Sexual Activity  . Alcohol use: No  . Drug use: No  . Sexual activity: Not on file  Lifestyle  . Physical activity    Days per week: 5 days    Minutes per session: 30 min  . Stress: Not at all  Relationships  . Social Herbalist on phone: Not on file    Gets together: Not on file    Attends religious service: Not on file    Active member of club or organization: Not on file    Attends meetings of clubs or organizations: Not on file    Relationship status: Married  Other Topics Concern  . Not on file  Social History Narrative  . Not on file    Outpatient Encounter  Medications as of 06/30/2019  Medication Sig  . amLODipine (NORVASC) 5 MG tablet TAKE ONE TABLET BY MOUTH EVERY DAY  . aspirin EC 81 MG tablet Take 81 mg by mouth daily.   . calcium carbonate (OS-CAL - DOSED IN MG OF ELEMENTAL CALCIUM) 1250 (500 CA) MG tablet Take 1 tablet by mouth daily.   . cetirizine (ZYRTEC) 10 MG tablet TAKE ONE TABLET EVERY DAY  . CINNAMON PO Take 1,200 mg by mouth 2 times daily at 12 noon and 4 pm.  . diclofenac sodium (VOLTAREN) 1 % GEL Apply 2 g 2 (two) times daily topically. To affected joint.  . fluticasone (FLONASE) 50 MCG/ACT nasal spray Place 2 sprays into both nostrils daily.  Marland Kitchen GLUCOSAMINE-CHONDROITIN PO Take 1 capsule by mouth daily.  . Misc Natural Products (BLACK CHERRY CONCENTRATE PO) Take 1,000 mg daily by mouth.  . Omega-3 Fatty Acids (FISH OIL) 1000 MG CAPS Take 1,000 mg by mouth daily.   Marland Kitchen omeprazole (PRILOSEC) 20 MG capsule TAKE 1 CAPSULE EVERY DAY  . ramipril (ALTACE) 5 MG capsule TAKE 1 CAPSULE EVERY DAY  . sildenafil (REVATIO) 20 MG tablet TAKE 1 TO 2 TABLETS DAILY AS NEEDED  . simvastatin (ZOCOR) 40 MG tablet TAKE 1 TABLET BY MOUTH DAILY  . [DISCONTINUED] amoxicillin (AMOXIL) 500 MG capsule Take 1 capsule (500 mg total) by mouth 2 (two) times daily.   No facility-administered encounter medications on file as of 06/30/2019.     Activities of Daily Living In your present state of health, do you have any difficulty performing the following activities: 06/30/2019  Hearing? N  Vision? N  Difficulty concentrating or making decisions? N  Walking or climbing stairs? N  Dressing or bathing? N  Doing errands, shopping? N  Preparing Food and eating ? N  Using the Toilet? N  In the past six months, have you accidently leaked urine? N  Do you have problems with loss of bowel control? N  Managing your Medications? N  Managing your Finances? N  Housekeeping or managing your Housekeeping? N  Some recent data might be hidden    Patient Care Team:  Einar Pheasant, MD as PCP - General (Internal Medicine)   Assessment:   This is a routine wellness examination for James Peck.  Nurse connected with patient 06/30/19 at  8:30 AM EST by a telephone enabled telemedicine application and verified that I am speaking with the correct person using two identifiers. Patient stated full name and DOB. Patient gave permission to continue with virtual visit. Patient's location was at home and Nurse's location was at [place] office.   Health Maintenance Due: See completed HM at the end of note.  Eye: Visual acuity not assessed. Virtual visit. Wears corrective lenses. Followed by their ophthalmologist every 12 months.   Dental: Visits every 6 months.    Hearing: Demonstrates normal hearing during visit.  Safety:  Patient feels safe at home- yes Patient does have smoke detectors at home- yes Patient does wear sunscreen or protective clothing when in direct sunlight - yes Patient does wear seat belt when in a moving vehicle - yes Patient drives- yes Adequate lighting in walkways free from debris- yes Grab bars and handrails used as appropriate- yes Ambulates with no assistive device Cell phone on person when ambulating outside of the home- yes  Social: Alcohol intake - no  Smoking history- former   Smokers in home? none Illicit drug use? none  Depression: PHQ 2 &9 complete. See screening below. Denies irritability, anhedonia, sadness/tearfullness.    Falls: See screening below.    Medication: Taking as directed and without issues.   Covid-19: Precautions and sickness symptoms discussed. Wears mask, social distancing, hand hygiene as appropriate.   Activities of Daily Living Patient denies needing assistance with: household chores, feeding themselves, getting from bed to chair, getting to the toilet, bathing/showering, dressing, managing money, or preparing meals.   Memory: Patient is alert. Patient denies difficulty focusing or  concentrating. Correctly identified the president of the [place], season and recall. 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   BMI- discussed the importance of a healthy diet, water intake and the benefits of aerobic exercise.  Educational material provided.  Physical activity- walking about 5 days, 30 minutes  Diet:  Low carb Water: good intake  Other Providers Patient Care Team: Einar Pheasant, MD as PCP - General (Internal Medicine)   Exercise Activities and Dietary recommendations Current Exercise Habits: Home exercise routine, Time (Minutes): 30, Frequency (Times/Week): 5, Weekly Exercise (Minutes/Week): 150, Intensity: Mild  Goals      Patient Stated   . Weight (lb) < 155 lb (70.3 kg) (pt-stated)       Fall Risk Fall Risk  06/30/2019 02/27/2019 01/29/2019 06/24/2018 05/27/2018  Falls in the past year? 0 0 0 0 No  Number falls in past yr: - 0 0 - -  Injury with Fall? - - 0 - -  Follow up Falls prevention discussed;Education provided Falls evaluation completed Falls evaluation completed - -  Timed Get Up and Go Performed: no, virtual visit  Depression Screen PHQ 2/9 Scores 06/30/2019 02/27/2019 01/29/2019 06/24/2018  PHQ - 2 Score 0 0 0 0  PHQ- 9 Score - - 0 -    Cognitive Function MMSE - Mini Mental State Exam 06/24/2018  Orientation to time 5  Orientation to Place 5  Registration 3  Attention/ Calculation 5  Recall 3  Language- name 2 objects 2  Language- repeat 1  Language- follow 3 step command 3  Language- read & follow direction 1  Write a sentence 1  Copy design 1  Total score 30     6CIT Screen 06/30/2019  What Year? 0 points  What month? 0 points  What time? 0 points  Count back from 20 0 points  Months in reverse 0 points  Repeat phrase 0 points  Total Score 0    Immunization History  Administered Date(s) Administered  . Fluad Quad(high Dose 65+) 04/09/2019  . Influenza Split 05/11/2014  . Influenza, High Dose  Seasonal PF 04/23/2017, 04/29/2018  . Influenza-Unspecified 04/25/2015, 04/23/2017  . Tdap 03/11/2013   Screening Tests Health  Maintenance  Topic Date Due  . TETANUS/TDAP  03/12/2023  . INFLUENZA VACCINE  Completed  . PNA vac Low Risk Adult  Discontinued       Plan:   Keep all routine maintenance appointments.   Follow up with your doctor today @ 9:00  Medicare Attestation I have personally reviewed: The patient's medical and social history Their use of alcohol, tobacco or illicit drugs Their current medications and supplements The patient's functional ability including ADLs,fall risks, home safety risks, cognitive, and hearing and visual impairment Diet and physical activities Evidence for depression   In addition, I have reviewed and discussed with patient certain preventive protocols, quality metrics, and best practice recommendations. A written personalized care plan for preventive services as well as general preventive health recommendations were provided to patient via mail.     Varney Biles, LPN  [id number]   Reviewed above information.  Agree with assessment and plan.    Dr Nicki Reaper

## 2019-06-30 NOTE — Progress Notes (Signed)
Patient ID: James Peck, male   DOB: 12/29/1940, 78 y.o.   MRN: [id number]   Virtual Visit via telephone Note  This visit type was conducted due to national recommendations for restrictions regarding the COVID-19 pandemic (e.g. social distancing).  This format is felt to be most appropriate for this patient at this time.  All issues noted in this document were discussed and addressed.  No physical exam was performed (except for noted visual exam findings with Video Visits).   I connected with James Peck by telephone and verified that I am speaking with the correct person using two identifiers. Location patient: home Location provider: work  Persons participating in the telephone visit: patient, provider  I discussed the limitations, risks, security and privacy concerns of performing an evaluation and management service by telephone and the availability of in person appointments. The patient expressed understanding and agreed to proceed.   Reason for visit: scheduled follow up.   HPI: He reports he is doing relatively well.  Tries to stay active.  No chest pain.  No sob.  States he feels his breathing is stable.  No acid reflux.  Controlled on prilosec.  No abdominal pain.  Bowels moving.  He does report congestion in the morning and cough - worse in am.  Cough has been persistent. Previous smoker.  Has not smoked since 1990s.  Present for months.  No fever.  Reports am drainage.  Denies any sob or chest tightness.  Is better once he is up and moving around.  Using flonase.  He is starting to have some problems with his left knee.  Some discomfort, swelling and is starting to limp.  Saw Dr Smith previously.  S/p injection.  Helped for approximately one year. Plans to call Dr Smith for f/u.  Tylenol does help some.     ROS: See pertinent positives and negatives per HPI.  Past Medical History:  Diagnosis Date  . Allergy   . Arthritis   . Coronary artery disease   . GERD (gastroesophageal  reflux disease)   . History of hiatal hernia   . Hypercholesteremia   . Hypertension     Past Surgical History:  Procedure Laterality Date  . CARDIAC CATHETERIZATION    . cardiac stents    . CATARACT EXTRACTION W/PHACO Right 09/10/2017   Procedure: CATARACT EXTRACTION PHACO AND INTRAOCULAR LENS PLACEMENT (IOC);  Surgeon: Porfilio, William, MD;  Location: [facility] ORS;  Service: Ophthalmology;  Laterality: Right;  [place] 01:01.7 AP% 15.3 CDE 9.38 Fluid pack lot # [id number]  . CATARACT EXTRACTION W/PHACO Left 10/02/2017   Procedure: CATARACT EXTRACTION PHACO AND INTRAOCULAR LENS PLACEMENT (IOC);  Surgeon: Porfilio, William, MD;  Location: [facility] ORS;  Service: Ophthalmology;  Laterality: Left;  [place] 00:42.3 AP% 16.2 CDE 6.85 Fluid Pack Lot # [id number]  . COLONOSCOPY WITH PROPOFOL N/A 06/28/2017   Procedure: COLONOSCOPY WITH PROPOFOL;  Surgeon: Elliott, Robert T, MD;  Location: [facility] ENDOSCOPY;  Service: Endoscopy;  Laterality: N/A;  . CORONARY ANGIOPLASTY     STENTS X 2  . KNEE ARTHROSCOPY      Family History  Problem Relation Age of Onset  . Heart disease Mother   . Diabetes Mother   . Heart disease Father   . Diabetes Father     SOCIAL HX: reviewed.    Current Outpatient Medications:  .  amLODipine (NORVASC) 5 MG tablet, TAKE ONE TABLET BY MOUTH EVERY DAY, Disp: 90 tablet, Rfl: 1 .  aspirin EC 81 MG tablet, Take 81   mg by mouth daily. , Disp: , Rfl:  .  calcium carbonate (OS-CAL - DOSED IN MG OF ELEMENTAL CALCIUM) 1250 (500 CA) MG tablet, Take 1 tablet by mouth daily. , Disp: , Rfl:  .  cetirizine (ZYRTEC) 10 MG tablet, TAKE ONE TABLET EVERY DAY, Disp: 90 tablet, Rfl: 1 .  CINNAMON PO, Take 1,200 mg by mouth 2 times daily at 12 noon and 4 pm., Disp: , Rfl:  .  diclofenac sodium (VOLTAREN) 1 % GEL, Apply 2 g 2 (two) times daily topically. To affected joint., Disp: 300 g, Rfl: 11 .  fluticasone (FLONASE) 50 MCG/ACT nasal spray, Place 2 sprays into both nostrils daily., Disp: 48 g, Rfl: 3 .   GLUCOSAMINE-CHONDROITIN PO, Take 1 capsule by mouth daily., Disp: , Rfl:  .  Misc Natural Products (BLACK CHERRY CONCENTRATE PO), Take 1,000 mg daily by mouth., Disp: , Rfl:  .  Omega-3 Fatty Acids (FISH OIL) 1000 MG CAPS, Take 1,000 mg by mouth daily. , Disp: , Rfl:  .  omeprazole (PRILOSEC) 20 MG capsule, TAKE 1 CAPSULE EVERY DAY, Disp: 90 capsule, Rfl: 1 .  ramipril (ALTACE) 5 MG capsule, TAKE 1 CAPSULE EVERY DAY, Disp: 90 capsule, Rfl: 1 .  sildenafil (REVATIO) 20 MG tablet, TAKE 1 TO 2 TABLETS DAILY AS NEEDED, Disp: 60 tablet, Rfl: 0 .  simvastatin (ZOCOR) 40 MG tablet, TAKE 1 TABLET BY MOUTH DAILY, Disp: 90 tablet, Rfl: 1  EXAM:  VITALS per patient if applicable:  277/41  GENERAL: alert.  Sounds to be in no acute distress.  Answering questions appropriately.    PSYCH/NEURO: pleasant and cooperative, no obvious depression or anxiety, speech and thought processing grossly intact  ASSESSMENT AND PLAN:  Discussed the following assessment and plan:  Acid reflux Controlled on prilosec.   Arteriosclerosis of coronary artery Has known CAD.  Was seeing Dr Ubaldo Glassing.  States has been released from regular f/u.  Currently doing well.  Continue risk factor modification.    Benign essential HTN Blood pressure under good control.  Continue same medication regimen.  Follow pressures.  Follow metabolic panel.    Cough Persistent cough as outlined.  Worse in am.  Some increased drainage.  Acid reflux controlled.  Recheck cxr.  flonase and saline nasal spray as directed.  Follow.    Degenerative arthritis of left knee Some increased pain as outlined.  Plans to f/u with Dr Tamala Julian.  Responded well to injections previously.   HLD (hyperlipidemia) On simvastatin.  Low cholesterol diet and exercise.  Follow lipid panel and liver function tests.    Hyperglycemia Low carb diet and exercise.  Follow met b and a1c.     I discussed the assessment and treatment plan with the patient. The patient was  provided an opportunity to ask questions and all were answered. The patient agreed with the plan and demonstrated an understanding of the instructions.   The patient was advised to call back or seek an in-person evaluation if the symptoms worsen or if the condition fails to improve as anticipated.  I provided 15 minutes of non-face-to-face time during this encounter.   Einar Pheasant, MD

## 2019-06-30 NOTE — Assessment & Plan Note (Signed)
Controlled on prilosec.   

## 2019-06-30 NOTE — Assessment & Plan Note (Signed)
Low carb diet and exercise.  Follow met b and a1c.  

## 2019-06-30 NOTE — Assessment & Plan Note (Signed)
Blood pressure under good control.  Continue same medication regimen.  Follow pressures.  Follow metabolic panel.   

## 2019-07-03 ENCOUNTER — Ambulatory Visit
Admission: RE | Admit: 2019-07-03 | Discharge: 2019-07-03 | Disposition: A | Discharge status: Home / Self Care | Payer: PPO | Source: Ambulatory Visit | Attending: Internal Medicine | Admitting: Internal Medicine

## 2019-07-03 ENCOUNTER — Other Ambulatory Visit
Admission: RE | Admit: 2019-07-03 | Discharge: 2019-07-03 | Disposition: A | Discharge status: Home / Self Care | Payer: PPO | Source: Ambulatory Visit | Attending: Internal Medicine | Admitting: Internal Medicine

## 2019-07-03 DIAGNOSIS — Z87891 Personal history of nicotine dependence: Secondary | ICD-10-CM | POA: Insufficient documentation

## 2019-07-03 DIAGNOSIS — I2510 Atherosclerotic heart disease of native coronary artery without angina pectoris: Secondary | ICD-10-CM | POA: Insufficient documentation

## 2019-07-03 DIAGNOSIS — R059 Cough, unspecified: Secondary | ICD-10-CM

## 2019-07-03 DIAGNOSIS — Z125 Encounter for screening for malignant neoplasm of prostate: Secondary | ICD-10-CM

## 2019-07-03 DIAGNOSIS — J449 Chronic obstructive pulmonary disease, unspecified: Secondary | ICD-10-CM | POA: Diagnosis not present

## 2019-07-03 DIAGNOSIS — E785 Hyperlipidemia, unspecified: Secondary | ICD-10-CM | POA: Insufficient documentation

## 2019-07-03 DIAGNOSIS — K219 Gastro-esophageal reflux disease without esophagitis: Secondary | ICD-10-CM | POA: Diagnosis not present

## 2019-07-03 DIAGNOSIS — Z7982 Long term (current) use of aspirin: Secondary | ICD-10-CM | POA: Insufficient documentation

## 2019-07-03 DIAGNOSIS — R05 Cough: Secondary | ICD-10-CM | POA: Diagnosis not present

## 2019-07-03 DIAGNOSIS — Z79899 Other long term (current) drug therapy: Secondary | ICD-10-CM | POA: Diagnosis not present

## 2019-07-03 DIAGNOSIS — R739 Hyperglycemia, unspecified: Secondary | ICD-10-CM | POA: Diagnosis not present

## 2019-07-03 DIAGNOSIS — Z8249 Family history of ischemic heart disease and other diseases of the circulatory system: Secondary | ICD-10-CM | POA: Diagnosis not present

## 2019-07-03 LAB — BASIC METABOLIC PANEL
Anion gap: 9 (ref 5–15)
BUN: 14 mg/dL (ref 8–23)
CO2: 23 mmol/L (ref 22–32)
Calcium: 9.3 mg/dL (ref 8.9–10.3)
Chloride: 104 mmol/L (ref 98–111)
Creatinine, Ser: 0.91 mg/dL (ref 0.61–1.24)
GFR calc Af Amer: >60 mL/min (ref >60)
GFR calc non Af Amer: >60 mL/min (ref >60)
Glucose, Bld: 111 mg/dL — ABNORMAL HIGH (ref 70–99)
Potassium: 4.2 mmol/L (ref 3.5–5.1)
Sodium: 136 mmol/L (ref 135–145)

## 2019-07-03 LAB — CBC WITH DIFFERENTIAL/PLATELET
Abs Immature Granulocytes: 0.01 10*3/uL (ref 0.00–0.07)
Basophils Absolute: 0 10*3/uL (ref 0.0–0.1)
Basophils Relative: 1 %
Eosinophils Absolute: 0.2 10*3/uL (ref 0.0–0.5)
Eosinophils Relative: 3 %
HCT: 45.6 % (ref 39.0–52.0)
Hemoglobin: 15.6 g/dL (ref 13.0–17.0)
Immature Granulocytes: 0 %
Lymphocytes Relative: 28 %
Lymphs Abs: 1.9 10*3/uL (ref 0.7–4.0)
MCH: 30.9 pg (ref 26.0–34.0)
MCHC: 34.2 g/dL (ref 30.0–36.0)
MCV: 90.3 fL (ref 80.0–100.0)
Monocytes Absolute: 0.8 10*3/uL (ref 0.1–1.0)
Monocytes Relative: 12 %
Neutro Abs: 3.8 10*3/uL (ref 1.7–7.7)
Neutrophils Relative %: 56 %
Platelets: 285 10*3/uL (ref 150–400)
RBC: 5.05 MIL/uL (ref 4.22–5.81)
RDW: 12.8 % (ref 11.5–15.5)
WBC: 6.7 10*3/uL (ref 4.0–10.5)
nRBC: 0 % (ref 0.0–0.2)

## 2019-07-03 LAB — HEPATIC FUNCTION PANEL
ALT: 19 U/L (ref 0–44)
AST: 22 U/L (ref 15–41)
Albumin: 4.4 g/dL (ref 3.5–5.0)
Alkaline Phosphatase: 54 U/L (ref 38–126)
Bilirubin, Direct: 0.2 mg/dL (ref 0.0–0.2)
Indirect Bilirubin: 0.8 mg/dL (ref 0.3–0.9)
Total Bilirubin: 1 mg/dL (ref 0.3–1.2)
Total Protein: 7.3 g/dL (ref 6.5–8.1)

## 2019-07-03 LAB — LIPID PANEL
Cholesterol: 153 mg/dL (ref 0–200)
HDL: 43 mg/dL (ref >40)
LDL Cholesterol: 94 mg/dL (ref 0–99)
Total CHOL/HDL Ratio: 3.6 RATIO
Triglycerides: 78 mg/dL (ref <150)
VLDL: 16 mg/dL (ref 0–40)

## 2019-07-03 LAB — HEMOGLOBIN A1C
Hgb A1c MFr Bld: 6.1 % — ABNORMAL HIGH (ref 4.8–5.6)
Mean Plasma Glucose: 128.37 mg/dL

## 2019-07-03 LAB — PSA: Prostatic Specific Antigen: 1.17 ng/mL (ref 0.00–4.00)

## 2019-07-03 NOTE — Addendum Note (Signed)
Addended by: Santiago Bur on: 07/03/2019 08:27 AM   Modules accepted: Orders

## 2019-07-03 NOTE — Addendum Note (Signed)
Addended by: Santiago Bur on: 07/03/2019 08:28 AM   Modules accepted: Orders

## 2019-07-04 ENCOUNTER — Encounter: Payer: Self-pay | Admitting: Internal Medicine

## 2019-07-04 DIAGNOSIS — R05 Cough: Secondary | ICD-10-CM

## 2019-07-04 DIAGNOSIS — R053 Chronic cough: Secondary | ICD-10-CM

## 2019-07-06 NOTE — Telephone Encounter (Signed)
Order placed for pulmonary referral.  

## 2019-07-08 ENCOUNTER — Ambulatory Visit: Payer: Self-pay

## 2019-07-08 ENCOUNTER — Other Ambulatory Visit: Payer: Self-pay

## 2019-07-08 ENCOUNTER — Encounter: Payer: Self-pay | Admitting: Family Medicine

## 2019-07-08 ENCOUNTER — Ambulatory Visit (INDEPENDENT_AMBULATORY_CARE_PROVIDER_SITE_OTHER): Payer: PPO | Admitting: Family Medicine

## 2019-07-08 VITALS — BP 118/68 | HR 78 | Ht 66.0 in | Wt 158.0 lb

## 2019-07-08 DIAGNOSIS — M1712 Unilateral primary osteoarthritis, left knee: Secondary | ICD-10-CM | POA: Diagnosis not present

## 2019-07-08 DIAGNOSIS — M25511 Pain in right shoulder: Secondary | ICD-10-CM

## 2019-07-08 DIAGNOSIS — M19011 Primary osteoarthritis, right shoulder: Secondary | ICD-10-CM

## 2019-07-08 DIAGNOSIS — M19019 Primary osteoarthritis, unspecified shoulder: Secondary | ICD-10-CM | POA: Insufficient documentation

## 2019-07-08 NOTE — Assessment & Plan Note (Signed)
Repeat injection given again today.  Discussed icing regimen and home exercise, discussed which activities to do which wants to avoid.  Patient is to increase activity slowly.  Patient could be a candidate for viscosupplementation but has done very well with steroid injections over the past.

## 2019-07-08 NOTE — Progress Notes (Signed)
[name] [address] [place], [place] [address] Phone: [phone] Subjective:   James Peck, James Peck.  This visit occurred during the SARS-CoV-2 public health emergency.  Safety protocols were in place, including screening questions prior to the visit, additional usage of staff PPE, and extensive cleaning of exam room while observing appropriate contact time as indicated for disinfecting solutions.   I'm seeing this patient by the request  of:    CC: Left knee pain, right shoulder pain  [id number]   2/28/2020Arthritic changes.  Discussed with patient a great length.  We can repeat injections as needed.  Patient did respond fairly well to Visco supplementation and they will to consider that again in the future.  Patient will follow-up with me again in 4 weeks.  We will get prior approval for Visco supplementation if needed.  Update 07/08/2019 James Peck is a 78 y.o. male coming in with complaint of left knee pain. Patient states that his knee pain began to increase past 2 weeks.   Also having right shoulder pain which is worse than his knee. Pain moves throughout the joint. Pain occurring for 2 weeks and can radiate down the arm. Wakes up at night in pain. Using Tylenol and CBD oil.     Past Medical History:  Diagnosis Date  . Allergy   . Arthritis   . Coronary artery disease   . GERD (gastroesophageal reflux disease)   . History of hiatal hernia   . Hypercholesteremia   . Hypertension    Past Surgical History:  Procedure Laterality Date  . CARDIAC CATHETERIZATION    . cardiac stents    . CATARACT EXTRACTION W/PHACO Right 09/10/2017   Procedure: CATARACT EXTRACTION PHACO AND INTRAOCULAR LENS PLACEMENT (IOC);  Surgeon: Birder Robson, MD;  Location: [facility] ORS;  Service: Ophthalmology;  Laterality: Right;  [place] 01:01.7 AP% 15.3 CDE 9.38 Fluid pack lot # [id number]  . CATARACT EXTRACTION W/PHACO Left  10/02/2017   Procedure: CATARACT EXTRACTION PHACO AND INTRAOCULAR LENS PLACEMENT (IOC);  Surgeon: Birder Robson, MD;  Location: [facility] ORS;  Service: Ophthalmology;  Laterality: Left;  [place] 00:42.3 AP% 16.2 CDE 6.85 Fluid Pack Lot # [id number]  . COLONOSCOPY WITH PROPOFOL N/A 06/28/2017   Procedure: COLONOSCOPY WITH PROPOFOL;  Surgeon: Manya Silvas, MD;  Location: [facility] ENDOSCOPY;  Service: Endoscopy;  Laterality: N/A;  . CORONARY ANGIOPLASTY     STENTS X 2  . KNEE ARTHROSCOPY     Social History   Socioeconomic History  . Marital status: Married    Spouse name: Not on file  . Number of children: Not on file  . Years of education: Not on file  . Highest education level: Not on file  Occupational History  . Not on file  Social Needs  . Financial resource strain: Not hard at all  . Food insecurity    Worry: Never true    Inability: Never true  . Transportation needs    Medical: Peck    Non-medical: Peck  Tobacco Use  . Smoking status: Former Research scientist (life sciences)  . Smokeless tobacco: Never Used  Substance and Sexual Activity  . Alcohol use: Peck  . Drug use: Peck  . Sexual activity: Not on file  Lifestyle  . Physical activity    Days per week: 5 days    Minutes per session: 30 min  . Stress: Not at all  Relationships  . Social connections  Talks on phone: Not on file    Gets together: Not on file    Attends religious service: Not on file    Active member of club or organization: Not on file    Attends meetings of clubs or organizations: Not on file    Relationship status: Married  Other Topics Concern  . Not on file  Social History Narrative  . Not on file   Peck Known Allergies Family History  Problem Relation Age of Onset  . Heart disease Mother   . Diabetes Mother   . Heart disease Father   . Diabetes Father      Current Outpatient Medications (Cardiovascular):  .  amLODipine (NORVASC) 5 MG tablet, TAKE ONE TABLET BY MOUTH EVERY DAY .  ramipril (ALTACE) 5 MG capsule, TAKE  1 CAPSULE EVERY DAY .  sildenafil (REVATIO) 20 MG tablet, TAKE 1 TO 2 TABLETS DAILY AS NEEDED .  simvastatin (ZOCOR) 40 MG tablet, TAKE 1 TABLET BY MOUTH DAILY  Current Outpatient Medications (Respiratory):  .  cetirizine (ZYRTEC) 10 MG tablet, TAKE ONE TABLET EVERY DAY .  fluticasone (FLONASE) 50 MCG/ACT nasal spray, Place 2 sprays into both nostrils daily.  Current Outpatient Medications (Analgesics):  .  aspirin EC 81 MG tablet, Take 81 mg by mouth daily.    Current Outpatient Medications (Other):  .  calcium carbonate (OS-CAL - DOSED IN MG OF ELEMENTAL CALCIUM) 1250 (500 CA) MG tablet, Take 1 tablet by mouth daily.  Marland Kitchen  CINNAMON PO, Take 1,200 mg by mouth 2 times daily at 12 noon and 4 pm. .  diclofenac sodium (VOLTAREN) 1 % GEL, Apply 2 g 2 (two) times daily topically. To affected joint. Marland Kitchen  GLUCOSAMINE-CHONDROITIN PO, Take 1 capsule by mouth daily. .  Misc Natural Products (BLACK CHERRY CONCENTRATE PO), Take 1,000 mg daily by mouth. .  Omega-3 Fatty Acids (FISH OIL) 1000 MG CAPS, Take 1,000 mg by mouth daily.  Marland Kitchen  omeprazole (PRILOSEC) 20 MG capsule, TAKE 1 CAPSULE EVERY DAY    Past medical history, social, surgical and family history all reviewed in electronic medical record.  Peck pertanent information unless stated regarding to the chief complaint.   Review of Systems:  Peck headache, visual changes, nausea, vomiting, diarrhea, constipation, dizziness, abdominal pain, skin rash, fevers, chills, night sweats, weight loss, swollen lymph nodes, body aches, joint swelling, muscle aches, chest pain, shortness of breath, mood changes.   Objective  Blood pressure 118/68, pulse 78, height 5\' 6"  (1.676 m), weight 158 lb (71.7 kg), SpO2 99 %.    General: Peck apparent distress alert and oriented x3 mood and affect normal, dressed appropriately.  HEENT: Pupils equal, extraocular movements intact  Respiratory: Patient's speak in full sentences and does not appear short of breath   Cardiovascular: Peck lower extremity edema, non tender, Peck erythema  Skin: Warm dry intact with Peck signs of infection or rash on extremities or on axial skeleton.  Abdomen: Soft nontender  Neuro: Cranial nerves II through XII are intact, neurovascularly intact in all extremities with 2+ DTRs and 2+ pulses.  Lymph: Peck lymphadenopathy of posterior or anterior cervical chain or axillae bilaterally.  Gait normal with good balance and coordination.  MSK:  Non tender with full range of motion and good stability and symmetric strength and tone of shoulders, elbows, wrist, hip, knee and ankles bilaterally.  Left knee exam shows the patient does have some abnormal thigh to calf ratio noted.  Arthritic changes.  Instability with valgus force noted.  Right shoulder exam shows severe swelling noted over the acromioclavicular joint.  Positive crossover.  Rotator cuff strength 5 out of 5 though noted.  After informed written and verbal consent, patient was seated on exam table. Left knee was prepped with alcohol swab and utilizing anterolateral approach, patient's left knee space was injected with 4:1  marcaine 0.5%: Kenalog 40mg /dL. Patient tolerated the procedure well without immediate complications.  Procedure: Real-time Ultrasound Guided Injection of right acromioclavicular joint Device: GE Logiq Q7 Ultrasound guided injection is preferred based studies that show increased duration, increased effect, greater accuracy, decreased procedural pain, increased response rate, and decreased cost with ultrasound guided versus blind injection.  Verbal informed consent obtained.  Time-out conducted.  Noted Peck overlying erythema, induration, or other signs of local infection.  Skin prepped in a sterile fashion.  Local anesthesia: Topical Ethyl chloride.  With sterile technique and under real time ultrasound guidance: With a 25-gauge half inch needle injected with 0.5 cc of 0.5% Marcaine and 0.5 cc of Kenalog 40 mg/mL  Completed without difficulty  Pain immediately resolved suggesting accurate placement of the medication.  Advised to call if fevers/chills, erythema, induration, drainage, or persistent bleeding.  Images permanently stored and available for review in the ultrasound unit.  Impression: Technically successful ultrasound guided injection.   Impression and Recommendations:     This case required medical decision making of moderate complexity. The above documentation has been reviewed and is accurate and complete James Pulley, DO       Note: This dictation was prepared with Dragon dictation along with smaller phrase technology. Any transcriptional errors that result from this process are unintentional.

## 2019-07-08 NOTE — Assessment & Plan Note (Signed)
Patient given injection today, tolerated the procedure well, discussed which activities to do which wants to avoid.  Discussed icing regimen, topical anti-inflammatories, patient will increase activity as tolerated.  Follow-up again in 4 to 8 weeks

## 2019-07-08 NOTE — Patient Instructions (Addendum)
  [address], [address] [place], [place] [address] Phone [phone]  Injected knee and shoulder Exercises 3x a week Hands within peripheral vision See me in 6-8 weeks

## 2019-07-27 ENCOUNTER — Ambulatory Visit: Payer: PPO | Attending: Internal Medicine

## 2019-07-27 DIAGNOSIS — Z20822 Contact with and (suspected) exposure to covid-19: Secondary | ICD-10-CM

## 2019-07-27 DIAGNOSIS — Z20828 Contact with and (suspected) exposure to other viral communicable diseases: Secondary | ICD-10-CM | POA: Diagnosis not present

## 2019-07-29 LAB — NOVEL CORONAVIRUS, NAA: SARS-CoV-2, NAA: NOT DETECTED

## 2019-08-11 ENCOUNTER — Encounter: Payer: Self-pay | Admitting: Pulmonary Disease

## 2019-08-11 ENCOUNTER — Ambulatory Visit: Payer: PPO | Admitting: Pulmonary Disease

## 2019-08-11 ENCOUNTER — Other Ambulatory Visit: Payer: Self-pay

## 2019-08-11 VITALS — BP 122/70 | HR 70 | Temp 97.7°F | Ht 66.0 in | Wt 155.4 lb

## 2019-08-11 DIAGNOSIS — R05 Cough: Secondary | ICD-10-CM

## 2019-08-11 DIAGNOSIS — R059 Cough, unspecified: Secondary | ICD-10-CM

## 2019-08-11 MED ORDER — COMBIVENT RESPIMAT 20-100 MCG/ACT IN AERS: 1.0000 | INHALATION_SPRAY | Freq: Four times a day (QID) | RESPIRATORY_TRACT | 6 refills | Status: DC | PRN

## 2019-08-11 NOTE — Progress Notes (Signed)
    Assessment & Plan:  1. Cough (Primary)   Patient Instructions  1.  Recommend that ramipril  (Altace ) be discontinued please discuss with Dr. Glendia.  2.  Continue using nasal saline.  3.  Change your Zyrtec  to take at bedtime.  4.  We will give you a trial of Combivent  1 puff up to 4 times a day as needed for wheezing/shortness of breath/cough.  5.  We will see you in follow-up in 3 months time call sooner should any new difficulties arise.  If your symptoms continue we will schedule for breathing tests at that time.  As we discussed today breathing test are not being performed currently due to the COVID-19 pandemic.  Please note: late entry documentation due to logistical difficulties during COVID-19 pandemic. This note is filed for information purposes only, and is not intended to be used for billing, nor does it represent the full scope/nature of the visit in question. Please see any associated scanned media linked to date of encounter for additional pertinent information.  Subjective:    HPI: James Peck is a 79 y.o. male presenting to the pulmonology clinic on 08/11/2019 with report of: pulmonary consult (per Dr. Glendia- pt reports of presistant cough, occ wheezing, sob with exertion and chest congestion mainly in the morning x2-3y. cough is occ prod with white mucus.  )     Outpatient Encounter Medications as of 08/11/2019  Medication Sig   aspirin  EC 81 MG tablet Take 81 mg by mouth daily.    calcium  carbonate (OS-CAL - DOSED IN MG OF ELEMENTAL CALCIUM ) 1250 (500 CA) MG tablet Take 1 tablet by mouth daily.    CINNAMON  PO Take 1,200 mg by mouth 2 times daily at 12 noon and 4 pm.   GLUCOSAMINE-CHONDROITIN PO Take 1 capsule by mouth daily.   Misc Natural Products (BLACK CHERRY CONCENTRATE PO) Take 1,000 mg daily by mouth.   Omega-3 Fatty Acids (FISH OIL) 1000 MG CAPS Take 1,000 mg by mouth daily.    [DISCONTINUED] amLODipine  (NORVASC ) 5 MG tablet TAKE ONE TABLET BY MOUTH  EVERY DAY   [DISCONTINUED] cetirizine  (ZYRTEC ) 10 MG tablet TAKE ONE TABLET EVERY DAY   [DISCONTINUED] diclofenac  sodium (VOLTAREN ) 1 % GEL Apply 2 g 2 (two) times daily topically. To affected joint. (Patient not taking: Reported on 07/05/2021)   [DISCONTINUED] fluticasone  (FLONASE ) 50 MCG/ACT nasal spray Place 2 sprays into both nostrils daily.   [DISCONTINUED] omeprazole  (PRILOSEC) 20 MG capsule TAKE 1 CAPSULE EVERY DAY   [DISCONTINUED] ramipril  (ALTACE ) 5 MG capsule TAKE 1 CAPSULE EVERY DAY   [DISCONTINUED] sildenafil  (REVATIO ) 20 MG tablet TAKE 1 TO 2 TABLETS DAILY AS NEEDED   [DISCONTINUED] simvastatin  (ZOCOR ) 40 MG tablet TAKE 1 TABLET BY MOUTH DAILY   [DISCONTINUED] Ipratropium-Albuterol  (COMBIVENT  RESPIMAT) 20-100 MCG/ACT AERS respimat Inhale 1 puff into the lungs every 6 (six) hours as needed for wheezing or shortness of breath (Cough). (Patient not taking: Reported on 06/21/2020)   No facility-administered encounter medications on file as of 08/11/2019.      Objective:   Vitals:   08/11/19 0910  BP: 122/70  Pulse: 70  Temp: 97.7 F (36.5 C)  Height: 5' 6 (1.676 m)  Weight: 155 lb 6.4 oz (70.5 kg)  SpO2: 97%  TempSrc: Temporal  BMI (Calculated): 25.09     Physical exam documentation is limited by delayed entry of information.

## 2019-08-11 NOTE — Patient Instructions (Addendum)
1.  Recommend that ramipril (Altace) be discontinued please discuss with Dr. Nicki Reaper.  2.  Continue using nasal saline.  3.  Change your Zyrtec to take at bedtime.  4.  We will give you a trial of Combivent 1 puff up to 4 times a day as needed for wheezing/shortness of breath/cough.  5.  We will see you in follow-up in 3 months time call sooner should any new difficulties arise.  If your symptoms continue we will schedule for breathing tests at that time.  As we discussed today breathing test are not being performed currently due to the COVID-19 pandemic.

## 2019-08-19 ENCOUNTER — Ambulatory Visit (INDEPENDENT_AMBULATORY_CARE_PROVIDER_SITE_OTHER): Payer: PPO | Admitting: Family Medicine

## 2019-08-19 ENCOUNTER — Other Ambulatory Visit: Payer: Self-pay

## 2019-08-19 ENCOUNTER — Encounter: Payer: Self-pay | Admitting: Family Medicine

## 2019-08-19 ENCOUNTER — Ambulatory Visit (INDEPENDENT_AMBULATORY_CARE_PROVIDER_SITE_OTHER): Payer: PPO

## 2019-08-19 VITALS — BP 120/70 | HR 81 | Ht 66.0 in | Wt 157.0 lb

## 2019-08-19 DIAGNOSIS — M25511 Pain in right shoulder: Secondary | ICD-10-CM

## 2019-08-19 DIAGNOSIS — G8929 Other chronic pain: Secondary | ICD-10-CM

## 2019-08-19 DIAGNOSIS — M19011 Primary osteoarthritis, right shoulder: Secondary | ICD-10-CM

## 2019-08-19 DIAGNOSIS — M75111 Incomplete rotator cuff tear or rupture of right shoulder, not specified as traumatic: Secondary | ICD-10-CM | POA: Diagnosis not present

## 2019-08-19 DIAGNOSIS — M75101 Unspecified rotator cuff tear or rupture of right shoulder, not specified as traumatic: Secondary | ICD-10-CM | POA: Insufficient documentation

## 2019-08-19 NOTE — Patient Instructions (Addendum)
Good to see you See me again in 6 weeks 

## 2019-08-19 NOTE — Progress Notes (Signed)
[place] [address] [place] Phone: [phone] Subjective:   I James Peck am serving as a Education administrator for Dr. Hulan Saas.  This visit occurred during the SARS-CoV-2 public health emergency.  Safety protocols were in place, including screening questions prior to the visit, additional usage of staff PPE, and extensive cleaning of exam room while observing appropriate contact time as indicated for disinfecting solutions.   I'm seeing this patient by the request  of:  Einar Pheasant, MD  CC: Right shoulder pain follow-up  [id number]   07/08/2019 [facility] Patient given injection today, tolerated the procedure well, discussed which activities to do which wants to avoid.  Discussed icing regimen, topical anti-inflammatories, patient will increase activity as tolerated.  Follow-up again in 4 to 8 weeks  Left knee Repeat injection given again today.  Discussed icing regimen and home exercise, discussed which activities to do which wants to avoid.  Patient is to increase activity slowly.  Patient could be a candidate for viscosupplementation but has done very well with steroid injections over the past.  08/19/2019 James Peck is a 79 y.o. male coming in with complaint of right shoulder and left knee pain. Patient states the knee is doing well. Shoulder is painful. AC injection last visit.  Patient states that he did help about 15 to 20%.  Continues to have pain on a daily basis and waking him up at night.  Certain range of motion or even daily activities such as dressing becomes more difficult.    Past Medical History:  Diagnosis Date  . Allergy   . Arthritis   . Coronary artery disease   . GERD (gastroesophageal reflux disease)   . History of hiatal hernia   . Hypercholesteremia   . Hypertension    Past Surgical History:  Procedure Laterality Date  . CARDIAC CATHETERIZATION    . cardiac stents    . CATARACT EXTRACTION W/PHACO Right  09/10/2017   Procedure: CATARACT EXTRACTION PHACO AND INTRAOCULAR LENS PLACEMENT (IOC);  Surgeon: Birder Robson, MD;  Location: [facility] ORS;  Service: Ophthalmology;  Laterality: Right;  [place] 01:01.7 AP% 15.3 CDE 9.38 Fluid pack lot # [id number]  . CATARACT EXTRACTION W/PHACO Left 10/02/2017   Procedure: CATARACT EXTRACTION PHACO AND INTRAOCULAR LENS PLACEMENT (IOC);  Surgeon: Birder Robson, MD;  Location: [facility] ORS;  Service: Ophthalmology;  Laterality: Left;  [place] 00:42.3 AP% 16.2 CDE 6.85 Fluid Pack Lot # [id number]  . COLONOSCOPY WITH PROPOFOL N/A 06/28/2017   Procedure: COLONOSCOPY WITH PROPOFOL;  Surgeon: Manya Silvas, MD;  Location: [facility] ENDOSCOPY;  Service: Endoscopy;  Laterality: N/A;  . CORONARY ANGIOPLASTY     STENTS X 2  . KNEE ARTHROSCOPY     Social History   Socioeconomic History  . Marital status: Married    Spouse name: Not on file  . Number of children: Not on file  . Years of education: Not on file  . Highest education level: Not on file  Occupational History  . Not on file  Tobacco Use  . Smoking status: Former Smoker    Packs/day: 1.00    Years: 40.00    Pack years: 40.00    Types: Cigarettes    Quit date: 2000    Years since quitting: 21.0  . Smokeless tobacco: Never Used  Substance and Sexual Activity  . Alcohol use: No  . Drug use: No  . Sexual activity: Not on file  Other Topics Concern  . Not on file  Social History Narrative  . Not on file   Social Determinants of Health   Financial Resource Strain:   . Difficulty of Paying Living Expenses: Not on file  Food Insecurity:   . Worried About Charity fundraiser in the Last Year: Not on file  . Ran Out of Food in the Last Year: Not on file  Transportation Needs:   . Lack of Transportation (Medical): Not on file  . Lack of Transportation (Non-Medical): Not on file  Physical Activity:   . Days of Exercise per Week: Not on file  . Minutes of Exercise per Session: Not on file  Stress:   .  Feeling of Stress : Not on file  Social Connections:   . Frequency of Communication with Friends and Family: Not on file  . Frequency of Social Gatherings with Friends and Family: Not on file  . Attends Religious Services: Not on file  . Active Member of Clubs or Organizations: Not on file  . Attends Archivist Meetings: Not on file  . Marital Status: Not on file   No Known Allergies Family History  Problem Relation Age of Onset  . Heart disease Mother   . Diabetes Mother   . Heart disease Father   . Diabetes Father      Current Outpatient Medications (Cardiovascular):  .  amLODipine (NORVASC) 5 MG tablet, TAKE ONE TABLET BY MOUTH EVERY DAY .  ramipril (ALTACE) 5 MG capsule, TAKE 1 CAPSULE EVERY DAY .  sildenafil (REVATIO) 20 MG tablet, TAKE 1 TO 2 TABLETS DAILY AS NEEDED .  simvastatin (ZOCOR) 40 MG tablet, TAKE 1 TABLET BY MOUTH DAILY  Current Outpatient Medications (Respiratory):  .  cetirizine (ZYRTEC) 10 MG tablet, TAKE ONE TABLET EVERY DAY .  fluticasone (FLONASE) 50 MCG/ACT nasal spray, Place 2 sprays into both nostrils daily. .  Ipratropium-Albuterol (COMBIVENT RESPIMAT) 20-100 MCG/ACT AERS respimat, Inhale 1 puff into the lungs every 6 (six) hours as needed for wheezing or shortness of breath (Cough).  Current Outpatient Medications (Analgesics):  .  aspirin EC 81 MG tablet, Take 81 mg by mouth daily.    Current Outpatient Medications (Other):  .  calcium carbonate (OS-CAL - DOSED IN MG OF ELEMENTAL CALCIUM) 1250 (500 CA) MG tablet, Take 1 tablet by mouth daily.  Marland Kitchen  CINNAMON PO, Take 1,200 mg by mouth 2 times daily at 12 noon and 4 pm. .  diclofenac sodium (VOLTAREN) 1 % GEL, Apply 2 g 2 (two) times daily topically. To affected joint. Marland Kitchen  GLUCOSAMINE-CHONDROITIN PO, Take 1 capsule by mouth daily. .  Misc Natural Products (BLACK CHERRY CONCENTRATE PO), Take 1,000 mg daily by mouth. .  Omega-3 Fatty Acids (FISH OIL) 1000 MG CAPS, Take 1,000 mg by mouth daily.    Marland Kitchen  omeprazole (PRILOSEC) 20 MG capsule, TAKE 1 CAPSULE EVERY DAY    Past medical history, social, surgical and family history all reviewed in electronic medical record.  No pertanent information unless stated regarding to the chief complaint.   Review of Systems:  No headache, visual changes, nausea, vomiting, diarrhea, constipation, dizziness, abdominal pain, skin rash, fevers, chills, night sweats, weight loss, swollen lymph nodes, body aches, joint swelling, chest pain, shortness of breath, mood changes. POSITIVE muscle aches  Objective  Blood pressure 120/70, pulse 81, height 5\' 6"  (1.676 m), weight 157 lb (71.2 kg), SpO2 96 %.   General: No apparent distress alert and oriented x3 mood and affect normal, dressed appropriately.  HEENT: Pupils equal,  extraocular movements intact  Respiratory: Patient's speak in full sentences and does not appear short of breath  Cardiovascular: No lower extremity edema, non tender, no erythema  Skin: Warm dry intact with no signs of infection or rash on extremities or on axial skeleton.  Abdomen: Soft nontender  Neuro: Cranial nerves II through XII are intact, neurovascularly intact in all extremities with 2+ DTRs and 2+ pulses.  Lymph: No lymphadenopathy of posterior or anterior cervical chain or axillae bilaterally.  Gait normal with good balance and coordination.  MSK: Right shoulder exam shows that patient does have some mild new onset weakness of the rotator cuff of 4 out of 5 strength compared to contralateral side.  Still positive crossover noted.  Positive impingement with Neer and Hawkins noted.  Limited musculoskeletal ultrasound was performed and interpreted by Lyndal Pulley  Limited ultrasound of patient's right shoulder does show that there is a supraspinatus tear that seems to be near full-thickness but no significant retraction noted.  Mild bursitis noted in this area as well in the subacromial space.  Degenerative changes of the  subscapularis tendon also noted.  Moderate arthritic changes with continued synovitis of the acromioclavicular joint impression: Rotator cuff tear  Procedure: Real-time Ultrasound Guided Injection of right glenohumeral joint Device: GE Logiq Q7  Ultrasound guided injection is preferred based studies that show increased duration, increased effect, greater accuracy, decreased procedural pain, increased response rate with ultrasound guided versus blind injection.  Verbal informed consent obtained.  Time-out conducted.  Noted no overlying erythema, induration, or other signs of local infection.  Skin prepped in a sterile fashion.  Local anesthesia: Topical Ethyl chloride.  With sterile technique and under real time ultrasound guidance:  Joint visualized.  23g 1  inch needle inserted posterior approach. Pictures taken for needle placement. Patient did have injection of 2 cc of 1% lidocaine, 2 cc of 0.5% Marcaine, and 1.0 cc of Kenalog 40 mg/dL. Completed without difficulty  Pain immediately resolved suggesting accurate placement of the medication.  Advised to call if fevers/chills, erythema, induration, drainage, or persistent bleeding.  Images permanently stored and available for review in the ultrasound unit.  Impression: Technically successful ultrasound guided injection.    Impression and Recommendations:     This case required medical decision making of moderate complexity. The above documentation has been reviewed and is accurate and complete Lyndal Pulley, DO       Note: This dictation was prepared with Dragon dictation along with smaller phrase technology. Any transcriptional errors that result from this process are unintentional.

## 2019-08-19 NOTE — Assessment & Plan Note (Signed)
Patient given injection, discussed icing regimen and home exercise, discussed topical anti-inflammatories.  Increase activity slowly over the course the next 6 to 8 weeks.

## 2019-09-17 ENCOUNTER — Other Ambulatory Visit: Payer: Self-pay | Admitting: Internal Medicine

## 2019-09-30 ENCOUNTER — Encounter: Payer: Self-pay | Admitting: Family Medicine

## 2019-09-30 ENCOUNTER — Other Ambulatory Visit: Payer: Self-pay

## 2019-09-30 ENCOUNTER — Ambulatory Visit (INDEPENDENT_AMBULATORY_CARE_PROVIDER_SITE_OTHER): Payer: PPO | Admitting: Family Medicine

## 2019-09-30 ENCOUNTER — Ambulatory Visit (INDEPENDENT_AMBULATORY_CARE_PROVIDER_SITE_OTHER): Payer: PPO

## 2019-09-30 VITALS — BP 120/72 | HR 77 | Ht 66.0 in | Wt 158.0 lb

## 2019-09-30 DIAGNOSIS — M25511 Pain in right shoulder: Secondary | ICD-10-CM | POA: Diagnosis not present

## 2019-09-30 DIAGNOSIS — M75111 Incomplete rotator cuff tear or rupture of right shoulder, not specified as traumatic: Secondary | ICD-10-CM | POA: Diagnosis not present

## 2019-09-30 DIAGNOSIS — G8929 Other chronic pain: Secondary | ICD-10-CM

## 2019-09-30 DIAGNOSIS — M19011 Primary osteoarthritis, right shoulder: Secondary | ICD-10-CM | POA: Diagnosis not present

## 2019-09-30 MED ORDER — VITAMIN D (ERGOCALCIFEROL) 1.25 MG (50000 UNIT) PO CAPS: 50000.0000 [IU] | ORAL_CAPSULE | ORAL | 0 refills | Status: DC

## 2019-09-30 NOTE — Assessment & Plan Note (Signed)
Improvement noted.  Improvement in range of motion.  I believe patient will do fairly well with conservative therapy.  Discussed icing regimen.  Follow-up again in 4 weeks

## 2019-09-30 NOTE — Progress Notes (Signed)
[facility] Phone: [phone] Subjective:   James Peck, am serving as a scribe for Dr. Hulan Saas. This visit occurred during the SARS-CoV-2 public health emergency.  Safety protocols were in place, including screening questions prior to the visit, additional usage of staff PPE, and extensive cleaning of exam room while observing appropriate contact time as indicated for disinfecting solutions.   I'm seeing this patient by the request  of:  Einar Pheasant, MD  CC: Shoulder pain follow-up  [id number]   1/20/20201 Patient given injection, discussed icing regimen and home exercise, discussed topical anti-inflammatories.  Increase activity slowly over the course the next 6 to 8 weeks.  Update 09/30/2019 James Peck is a 79 y.o. male coming in with complaint of right shoulder, rotator cuff tear. States that his pain is in posterior aspect of shoulder with flexion and when he lies on the that side. Pain is achy at rest. Injection did help to improve his pain.  Patient was found to have a degenerative tear of the rotator cuff as well as severe arthritic changes of the acromioclavicular joint.  Patient is last injection in January did help.  Still having some pain at night but nothing as severe.     Past Medical History:  Diagnosis Date  . Allergy   . Arthritis   . Coronary artery disease   . GERD (gastroesophageal reflux disease)   . History of hiatal hernia   . Hypercholesteremia   . Hypertension    Past Surgical History:  Procedure Laterality Date  . CARDIAC CATHETERIZATION    . cardiac stents    . CATARACT EXTRACTION W/PHACO Right 09/10/2017   Procedure: CATARACT EXTRACTION PHACO AND INTRAOCULAR LENS PLACEMENT (IOC);  Surgeon: Birder Robson, MD;  Location: [facility] ORS;  Service: Ophthalmology;  Laterality: Right;  [place] 01:01.7 AP% 15.3 CDE 9.38 Fluid pack lot # [id number]  . CATARACT EXTRACTION W/PHACO  Left 10/02/2017   Procedure: CATARACT EXTRACTION PHACO AND INTRAOCULAR LENS PLACEMENT (IOC);  Surgeon: Birder Robson, MD;  Location: [facility] ORS;  Service: Ophthalmology;  Laterality: Left;  [place] 00:42.3 AP% 16.2 CDE 6.85 Fluid Pack Lot # [id number]  . COLONOSCOPY WITH PROPOFOL N/A 06/28/2017   Procedure: COLONOSCOPY WITH PROPOFOL;  Surgeon: Manya Silvas, MD;  Location: [facility] ENDOSCOPY;  Service: Endoscopy;  Laterality: N/A;  . CORONARY ANGIOPLASTY     STENTS X 2  . KNEE ARTHROSCOPY     Social History   Socioeconomic History  . Marital status: Married    Spouse name: Not on file  . Number of children: Not on file  . Years of education: Not on file  . Highest education level: Not on file  Occupational History  . Not on file  Tobacco Use  . Smoking status: Former Smoker    Packs/day: 1.00    Years: 40.00    Pack years: 40.00    Types: Cigarettes    Quit date: 2000    Years since quitting: 21.1  . Smokeless tobacco: Never Used  Substance and Sexual Activity  . Alcohol use: Peck  . Drug use: Peck  . Sexual activity: Not on file  Other Topics Concern  . Not on file  Social History Narrative  . Not on file   Social Determinants of Health   Financial Resource Strain:   . Difficulty of Paying Living Expenses: Not on file  Food Insecurity:   . Worried About Charity fundraiser in  the Last Year: Not on file  . Ran Out of Food in the Last Year: Not on file  Transportation Needs:   . Lack of Transportation (Medical): Not on file  . Lack of Transportation (Non-Medical): Not on file  Physical Activity:   . Days of Exercise per Week: Not on file  . Minutes of Exercise per Session: Not on file  Stress:   . Feeling of Stress : Not on file  Social Connections:   . Frequency of Communication with Friends and Family: Not on file  . Frequency of Social Gatherings with Friends and Family: Not on file  . Attends Religious Services: Not on file  . Active Member of Clubs or Organizations:  Not on file  . Attends Archivist Meetings: Not on file  . Marital Status: Not on file   Peck Known Allergies Family History  Problem Relation Age of Onset  . Heart disease Mother   . Diabetes Mother   . Heart disease Father   . Diabetes Father      Current Outpatient Medications (Cardiovascular):  .  amLODipine (NORVASC) 5 MG tablet, TAKE ONE TABLET BY MOUTH EVERY DAY .  ramipril (ALTACE) 5 MG capsule, TAKE 1 CAPSULE EVERY DAY .  sildenafil (REVATIO) 20 MG tablet, TAKE 1 TO 2 TABLETS DAILY AS NEEDED .  simvastatin (ZOCOR) 40 MG tablet, TAKE 1 TABLET BY MOUTH DAILY  Current Outpatient Medications (Respiratory):  .  cetirizine (ZYRTEC) 10 MG tablet, TAKE ONE TABLET EVERY DAY .  fluticasone (FLONASE) 50 MCG/ACT nasal spray, Place 2 sprays into both nostrils daily. .  Ipratropium-Albuterol (COMBIVENT RESPIMAT) 20-100 MCG/ACT AERS respimat, Inhale 1 puff into the lungs every 6 (six) hours as needed for wheezing or shortness of breath (Cough).  Current Outpatient Medications (Analgesics):  .  aspirin EC 81 MG tablet, Take 81 mg by mouth daily.    Current Outpatient Medications (Other):  .  calcium carbonate (OS-CAL - DOSED IN MG OF ELEMENTAL CALCIUM) 1250 (500 CA) MG tablet, Take 1 tablet by mouth daily.  Marland Kitchen  CINNAMON PO, Take 1,200 mg by mouth 2 times daily at 12 noon and 4 pm. .  diclofenac sodium (VOLTAREN) 1 % GEL, Apply 2 g 2 (two) times daily topically. To affected joint. Marland Kitchen  GLUCOSAMINE-CHONDROITIN PO, Take 1 capsule by mouth daily. .  Misc Natural Products (BLACK CHERRY CONCENTRATE PO), Take 1,000 mg daily by mouth. .  Omega-3 Fatty Acids (FISH OIL) 1000 MG CAPS, Take 1,000 mg by mouth daily.  Marland Kitchen  omeprazole (PRILOSEC) 20 MG capsule, TAKE 1 CAPSULE EVERY DAY .  Vitamin D, Ergocalciferol, (DRISDOL) 1.25 MG (50000 UNIT) CAPS capsule, Take 1 capsule (50,000 Units total) by mouth every 7 (seven) days.   Reviewed prior external information including notes and imaging from    primary care provider As well as notes that were available from care everywhere and other healthcare systems.  Past medical history, social, surgical and family history all reviewed in electronic medical record.  Peck pertanent information unless stated regarding to the chief complaint.   Review of Systems:  Peck headache, visual changes, nausea, vomiting, diarrhea, constipation, dizziness, abdominal pain, skin rash, fevers, chills, night sweats, weight loss, swollen lymph nodes, body aches, joint swelling, chest pain, shortness of breath, mood changes. POSITIVE muscle aches  Objective  Blood pressure 120/72, pulse 77, height 5\' 6"  (1.676 m), weight 158 lb (71.7 kg), SpO2 97 %.   General: Peck apparent distress alert and oriented x3 mood and  affect normal, dressed appropriately.  HEENT: Pupils equal, extraocular movements intact  Respiratory: Patient's speak in full sentences and does not appear short of breath  Cardiovascular: Peck lower extremity edema, non tender, Peck erythema  Skin: Warm dry intact with Peck signs of infection or rash on extremities or on axial skeleton.  Abdomen: Soft nontender  Neuro: Cranial nerves II through XII are intact, neurovascularly intact in all extremities with 2+ DTRs and 2+ pulses.  Lymph: Peck lymphadenopathy of posterior or anterior cervical chain or axillae bilaterally.  Gait normal with good balance and coordination.  MSK:  N arthritic changes of multiple joints  Right shoulder though shows the patient does have full range of motion.  Abnormality still noted of the acromioclavicular joint with swelling noted.  Tender to palpation in this area.  Positive crossover.  5 out of 5 strength of the rotator cuff.  Limited musculoskeletal ultrasound was performed and interpreted by Lyndal Pulley  Limited ultrasound of patient's right shoulder shows that the joint degenerative tearing of the rotator cuff is still present but Peck retraction noted.  Moderate to severe  arthritic changes of the acromioclavicular joint is noted with mild hypoechoic changes still but decrease in neovascularization in Doppler flow.    Impression and Recommendations:     This case required medical decision making of moderate complexity. The above documentation has been reviewed and is accurate and complete Lyndal Pulley, DO       Note: This dictation was prepared with Dragon dictation along with smaller phrase technology. Any transcriptional errors that result from this process are unintentional.

## 2019-09-30 NOTE — Assessment & Plan Note (Signed)
Patient will have chronic problems with this.  No significant exacerbation but some mild reaccumulation of the swelling again.  Discussed the possibility of need for repeating injections or potential surgical intervention if it worsens significantly.  Patient will follow-up again in 4 to 8 weeks.

## 2019-09-30 NOTE — Patient Instructions (Signed)
Good to see you  Looks like it is doing well but still a lot of arthritis of the [facility] joint.  May need injections every 10 weeks.  pennsaid pinkie amount topically 2 times daily as needed.  Ice 20 minutes 2 times daily. Usually after activity and before bed. Keep hands within peripheral vision  Sent in vitamin D  See em again in 2 months

## 2019-10-19 ENCOUNTER — Encounter: Payer: Self-pay | Admitting: Internal Medicine

## 2019-10-19 NOTE — Telephone Encounter (Signed)
Please call and notify pt that I would like to schedule an appt to discuss changing his medication .  Please schedule

## 2019-10-21 ENCOUNTER — Other Ambulatory Visit: Payer: Self-pay | Admitting: Internal Medicine

## 2019-10-23 ENCOUNTER — Ambulatory Visit (INDEPENDENT_AMBULATORY_CARE_PROVIDER_SITE_OTHER): Payer: PPO | Admitting: Internal Medicine

## 2019-10-23 ENCOUNTER — Other Ambulatory Visit: Payer: Self-pay

## 2019-10-23 VITALS — BP 116/62 | HR 71 | Temp 97.2°F | Resp 16 | Wt 156.4 lb

## 2019-10-23 DIAGNOSIS — I10 Essential (primary) hypertension: Secondary | ICD-10-CM | POA: Diagnosis not present

## 2019-10-23 DIAGNOSIS — I2510 Atherosclerotic heart disease of native coronary artery without angina pectoris: Secondary | ICD-10-CM | POA: Diagnosis not present

## 2019-10-23 MED ORDER — TELMISARTAN 20 MG PO TABS: 20.0000 mg | ORAL_TABLET | Freq: Every day | ORAL | 1 refills | Status: DC

## 2019-10-23 NOTE — Progress Notes (Signed)
Patient ID: James Peck, male   DOB: 26-Jan-1941, 79 y.o.   MRN: [id number]   Subjective:    Patient ID: James Peck, male    DOB: April 17, 1941, 79 y.o.   MRN: [id number]  HPI This visit occurred during the SARS-CoV-2 public health emergency.  Safety protocols were in place, including screening questions prior to the visit, additional usage of staff PPE, and extensive cleaning of exam room while observing appropriate contact time as indicated for disinfecting solutions.  Patient here as a work in to discuss changing his blood pressure medication.  Saw pulmonary.  Concern that ramipril may be contributing to his cough.  Reports he stopped ramipril last Sunday.  States cannot tell a big difference yet - regarding his cough.  No chest pain.  No sob.  No increased congestion.  No fever.  States feels good.    Past Medical History:  Diagnosis Date  . Allergy   . Arthritis   . Coronary artery disease   . GERD (gastroesophageal reflux disease)   . History of hiatal hernia   . Hypercholesteremia   . Hypertension    Past Surgical History:  Procedure Laterality Date  . CARDIAC CATHETERIZATION    . cardiac stents    . CATARACT EXTRACTION W/PHACO Right 09/10/2017   Procedure: CATARACT EXTRACTION PHACO AND INTRAOCULAR LENS PLACEMENT (IOC);  Surgeon: Birder Robson, MD;  Location: [facility] ORS;  Service: Ophthalmology;  Laterality: Right;  [place] 01:01.7 AP% 15.3 CDE 9.38 Fluid pack lot # [id number]  . CATARACT EXTRACTION W/PHACO Left 10/02/2017   Procedure: CATARACT EXTRACTION PHACO AND INTRAOCULAR LENS PLACEMENT (IOC);  Surgeon: Birder Robson, MD;  Location: [facility] ORS;  Service: Ophthalmology;  Laterality: Left;  [place] 00:42.3 AP% 16.2 CDE 6.85 Fluid Pack Lot # [id number]  . COLONOSCOPY WITH PROPOFOL N/A 06/28/2017   Procedure: COLONOSCOPY WITH PROPOFOL;  Surgeon: Manya Silvas, MD;  Location: [facility] ENDOSCOPY;  Service: Endoscopy;  Laterality: N/A;  . CORONARY ANGIOPLASTY     STENTS X 2  . KNEE  ARTHROSCOPY     Family History  Problem Relation Age of Onset  . Heart disease Mother   . Diabetes Mother   . Heart disease Father   . Diabetes Father    Social History   Socioeconomic History  . Marital status: Married    Spouse name: Not on file  . Number of children: Not on file  . Years of education: Not on file  . Highest education level: Not on file  Occupational History  . Not on file  Tobacco Use  . Smoking status: Former Smoker    Packs/day: 1.00    Years: 40.00    Pack years: 40.00    Types: Cigarettes    Quit date: 2000    Years since quitting: 21.2  . Smokeless tobacco: Never Used  Substance and Sexual Activity  . Alcohol use: No  . Drug use: No  . Sexual activity: Not on file  Other Topics Concern  . Not on file  Social History Narrative  . Not on file   Social Determinants of Health   Financial Resource Strain:   . Difficulty of Paying Living Expenses:   Food Insecurity:   . Worried About Charity fundraiser in the Last Year:   . Arboriculturist in the Last Year:   Transportation Needs:   . Film/video editor (Medical):   Marland Kitchen Lack of Transportation (Non-Medical):   Physical Activity:   . Days of Exercise  per Week:   . Minutes of Exercise per Session:   Stress:   . Feeling of Stress :   Social Connections:   . Frequency of Communication with Friends and Family:   . Frequency of Social Gatherings with Friends and Family:   . Attends Religious Services:   . Active Member of Clubs or Organizations:   . Attends Archivist Meetings:   Marland Kitchen Marital Status:     Outpatient Encounter Medications as of 10/23/2019  Medication Sig  . amLODipine (NORVASC) 5 MG tablet TAKE ONE TABLET BY MOUTH EVERY DAY  . aspirin EC 81 MG tablet Take 81 mg by mouth daily.   . calcium carbonate (OS-CAL - DOSED IN MG OF ELEMENTAL CALCIUM) 1250 (500 CA) MG tablet Take 1 tablet by mouth daily.   . cetirizine (ZYRTEC) 10 MG tablet TAKE ONE TABLET EVERY DAY  .  CINNAMON PO Take 1,200 mg by mouth 2 times daily at 12 noon and 4 pm.  . diclofenac sodium (VOLTAREN) 1 % GEL Apply 2 g 2 (two) times daily topically. To affected joint.  . fluticasone (FLONASE) 50 MCG/ACT nasal spray Place 2 sprays into both nostrils daily.  Marland Kitchen GLUCOSAMINE-CHONDROITIN PO Take 1 capsule by mouth daily.  . Ipratropium-Albuterol (COMBIVENT RESPIMAT) 20-100 MCG/ACT AERS respimat Inhale 1 puff into the lungs every 6 (six) hours as needed for wheezing or shortness of breath (Cough).  . Misc Natural Products (BLACK CHERRY CONCENTRATE PO) Take 1,000 mg daily by mouth.  . Omega-3 Fatty Acids (FISH OIL) 1000 MG CAPS Take 1,000 mg by mouth daily.   . sildenafil (REVATIO) 20 MG tablet TAKE 1 TO 2 TABLETS BY MOUTH EVERY DAY AS NEEDED  . simvastatin (ZOCOR) 40 MG tablet TAKE 1 TABLET BY MOUTH DAILY  . telmisartan (MICARDIS) 20 MG tablet Take 1 tablet (20 mg total) by mouth daily.  . Vitamin D, Ergocalciferol, (DRISDOL) 1.25 MG (50000 UNIT) CAPS capsule Take 1 capsule (50,000 Units total) by mouth every 7 (seven) days.  . [DISCONTINUED] omeprazole (PRILOSEC) 20 MG capsule TAKE 1 CAPSULE EVERY DAY  . [DISCONTINUED] ramipril (ALTACE) 5 MG capsule TAKE 1 CAPSULE EVERY DAY   No facility-administered encounter medications on file as of 10/23/2019.    Review of Systems  Constitutional: Negative for appetite change and unexpected weight change.  HENT: Negative for congestion and sinus pressure.   Respiratory: Positive for cough. Negative for chest tightness and shortness of breath.   Cardiovascular: Negative for chest pain, palpitations and leg swelling.  Gastrointestinal: Negative for abdominal pain, diarrhea, nausea and vomiting.  Genitourinary: Negative for difficulty urinating and dysuria.  Musculoskeletal: Negative for joint swelling and myalgias.  Skin: Negative for color change and rash.  Neurological: Negative for dizziness, light-headedness and headaches.  Psychiatric/Behavioral:  Negative for agitation and dysphoric mood.       Objective:    Physical Exam Vitals reviewed.  Constitutional:      General: He is not in acute distress.    Appearance: Normal appearance.  HENT:     Head: Normocephalic and atraumatic.     Right Ear: External ear normal.     Left Ear: External ear normal.  Eyes:     General: No scleral icterus.       Right eye: No discharge.        Left eye: No discharge.     Conjunctiva/sclera: Conjunctivae normal.  Cardiovascular:     Rate and Rhythm: Normal rate and regular rhythm.  Pulmonary:  Effort: Pulmonary effort is normal.     Breath sounds: Normal breath sounds. No wheezing.  Abdominal:     General: Bowel sounds are normal.     Palpations: Abdomen is soft.     Tenderness: There is no abdominal tenderness.  Musculoskeletal:        General: No swelling or tenderness.     Cervical back: Neck supple. No tenderness.  Lymphadenopathy:     Cervical: No cervical adenopathy.  Skin:    Findings: No erythema or rash.  Neurological:     Mental Status: He is alert.  Psychiatric:        Mood and Affect: Mood normal.        Behavior: Behavior normal.     BP 116/62   Pulse 71   Temp (!) 97.2 F (36.2 C)   Resp 16   Wt 156 lb 6.4 oz (70.9 kg)   SpO2 97%   BMI 25.24 kg/m  Wt Readings from Last 3 Encounters:  10/23/19 156 lb 6.4 oz (70.9 kg)  09/30/19 158 lb (71.7 kg)  08/19/19 157 lb (71.2 kg)     Lab Results  Component Value Date   WBC 6.7 07/03/2019   HGB 15.6 07/03/2019   HCT 45.6 07/03/2019   PLT 285 07/03/2019   GLUCOSE 111 (H) 07/03/2019   CHOL 153 07/03/2019   TRIG 78 07/03/2019   HDL 43 07/03/2019   LDLCALC 94 07/03/2019   ALT 19 07/03/2019   AST 22 07/03/2019   NA 136 07/03/2019   K 4.2 07/03/2019   CL 104 07/03/2019   CREATININE 0.91 07/03/2019   BUN 14 07/03/2019   CO2 23 07/03/2019   TSH 2.26 03/19/2019   PSA 1.39 12/16/2017   HGBA1C 6.1 (H) 07/03/2019    DG Chest 2 View  Result Date:  07/03/2019 CLINICAL DATA:  Cough and chest congestion for approximately 4 months. EXAM: CHEST - 2 VIEW COMPARISON:  01/06/2018 from [place]: The heart size and mediastinal contours are within normal limits. Aortic atherosclerosis. Pulmonary interstitial prominence and mild hyperinflation again seen, consistent with COPD. No evidence of pulmonary infiltrate or edema. No evidence of pleural effusion. The visualized skeletal structures are unremarkable. IMPRESSION: COPD.  No active cardiopulmonary disease. Electronically Signed   By: Marlaine Hind M.D.   On: 07/03/2019 08:28       Assessment & Plan:   Problem List Items Addressed This Visit    Benign essential HTN - Primary    On ramipril.  Had been doing well on this medication.  Concern ace inhibitor may be contributing to his pulmonary symptoms.  Discussed changing and a trial of ARB.  Start micardis 20mg  q day.  Follow pressures.  Follow metabolic panel.       Relevant Medications   telmisartan (MICARDIS) 20 MG tablet   CAD in native artery    No chest pain.  Followed by Dr Ubaldo Glassing.  Continue risk factor modification.       Relevant Medications   telmisartan (MICARDIS) 20 MG tablet       Einar Pheasant, MD

## 2019-10-26 ENCOUNTER — Other Ambulatory Visit: Payer: Self-pay | Admitting: Internal Medicine

## 2019-11-01 ENCOUNTER — Encounter: Payer: Self-pay | Admitting: Internal Medicine

## 2019-11-01 NOTE — Assessment & Plan Note (Signed)
On ramipril.  Had been doing well on this medication.  Concern ace inhibitor may be contributing to his pulmonary symptoms.  Discussed changing and a trial of ARB.  Start micardis 20mg  q day.  Follow pressures.  Follow metabolic panel.

## 2019-11-01 NOTE — Assessment & Plan Note (Signed)
No chest pain.  Followed by Dr Ubaldo Glassing.  Continue risk factor modification.

## 2019-11-04 ENCOUNTER — Telehealth: Payer: Self-pay

## 2019-11-04 DIAGNOSIS — E785 Hyperlipidemia, unspecified: Secondary | ICD-10-CM

## 2019-11-04 DIAGNOSIS — R739 Hyperglycemia, unspecified: Secondary | ICD-10-CM

## 2019-11-04 NOTE — Telephone Encounter (Signed)
Please place future lab orders. Thank you!

## 2019-11-05 NOTE — Telephone Encounter (Signed)
Orders placed for f/u labs.  

## 2019-11-05 NOTE — Addendum Note (Signed)
Addended by: Alisa Graff on: 11/05/2019 05:22 AM   Modules accepted: Orders

## 2019-11-06 ENCOUNTER — Other Ambulatory Visit (INDEPENDENT_AMBULATORY_CARE_PROVIDER_SITE_OTHER): Payer: PPO

## 2019-11-06 ENCOUNTER — Other Ambulatory Visit: Payer: Self-pay

## 2019-11-06 DIAGNOSIS — R739 Hyperglycemia, unspecified: Secondary | ICD-10-CM | POA: Diagnosis not present

## 2019-11-06 DIAGNOSIS — E785 Hyperlipidemia, unspecified: Secondary | ICD-10-CM

## 2019-11-06 LAB — HEPATIC FUNCTION PANEL
ALT: 18 U/L (ref 0–53)
AST: 20 U/L (ref 0–37)
Albumin: 4.5 g/dL (ref 3.5–5.2)
Alkaline Phosphatase: 57 U/L (ref 39–117)
Bilirubin, Direct: 0.1 mg/dL (ref 0.0–0.3)
Total Bilirubin: 0.8 mg/dL (ref 0.2–1.2)
Total Protein: 6.8 g/dL (ref 6.0–8.3)

## 2019-11-06 LAB — LIPID PANEL
Cholesterol: 155 mg/dL (ref 0–200)
HDL: 43.1 mg/dL (ref >39.00)
LDL Cholesterol: 93 mg/dL (ref 0–99)
NonHDL: 111.97
Total CHOL/HDL Ratio: 4
Triglycerides: 96 mg/dL (ref 0.0–149.0)
VLDL: 19.2 mg/dL (ref 0.0–40.0)

## 2019-11-06 LAB — BASIC METABOLIC PANEL
BUN: 15 mg/dL (ref 6–23)
CO2: 26 mEq/L (ref 19–32)
Calcium: 9.6 mg/dL (ref 8.4–10.5)
Chloride: 104 mEq/L (ref 96–112)
Creatinine, Ser: 0.93 mg/dL (ref 0.40–1.50)
GFR: 78.55 mL/min (ref >60.00)
Glucose, Bld: 108 mg/dL — ABNORMAL HIGH (ref 70–99)
Potassium: 4.2 mEq/L (ref 3.5–5.1)
Sodium: 139 mEq/L (ref 135–145)

## 2019-11-06 LAB — HEMOGLOBIN A1C: Hgb A1c MFr Bld: 5.9 % (ref 4.6–6.5)

## 2019-11-10 ENCOUNTER — Ambulatory Visit (INDEPENDENT_AMBULATORY_CARE_PROVIDER_SITE_OTHER): Payer: PPO | Admitting: Internal Medicine

## 2019-11-10 ENCOUNTER — Encounter: Payer: Self-pay | Admitting: Internal Medicine

## 2019-11-10 ENCOUNTER — Other Ambulatory Visit: Payer: Self-pay

## 2019-11-10 VITALS — BP 104/60 | HR 71 | Temp 96.6°F | Resp 16 | Ht 66.0 in | Wt 155.0 lb

## 2019-11-10 DIAGNOSIS — R0989 Other specified symptoms and signs involving the circulatory and respiratory systems: Secondary | ICD-10-CM | POA: Diagnosis not present

## 2019-11-10 DIAGNOSIS — E785 Hyperlipidemia, unspecified: Secondary | ICD-10-CM | POA: Diagnosis not present

## 2019-11-10 DIAGNOSIS — R739 Hyperglycemia, unspecified: Secondary | ICD-10-CM | POA: Diagnosis not present

## 2019-11-10 DIAGNOSIS — R05 Cough: Secondary | ICD-10-CM

## 2019-11-10 DIAGNOSIS — K219 Gastro-esophageal reflux disease without esophagitis: Secondary | ICD-10-CM

## 2019-11-10 DIAGNOSIS — R059 Cough, unspecified: Secondary | ICD-10-CM

## 2019-11-10 DIAGNOSIS — I2510 Atherosclerotic heart disease of native coronary artery without angina pectoris: Secondary | ICD-10-CM

## 2019-11-10 DIAGNOSIS — I10 Essential (primary) hypertension: Secondary | ICD-10-CM

## 2019-11-10 NOTE — Progress Notes (Signed)
Patient ID: James Peck, male   DOB: 01/08/1941, 79 y.o.   MRN: [id number]   Subjective:    Patient ID: James Peck, male    DOB: 1941/01/08, 79 y.o.   MRN: [id number]  HPI This visit occurred during the SARS-CoV-2 public health emergency.  Safety protocols were in place, including screening questions prior to the visit, additional usage of staff PPE, and extensive cleaning of exam room while observing appropriate contact time as indicated for disinfecting solutions.  Patient here for a scheduled follow up.  Last visit was started on micardis. altace was stopped to confirm if contributing to cough.  States he has been of altace since before the last visit.  Cannot tell a bid difference.  Has f/u planned with pulmonary.  Blood pressure doing well.  He feels good.  No chest pain or sob.  No acid reflux.  Still with am cough.  No abdominal pain.  Bowels moving.    Past Medical History:  Diagnosis Date  . Allergy   . Arthritis   . Coronary artery disease   . GERD (gastroesophageal reflux disease)   . History of hiatal hernia   . Hypercholesteremia   . Hypertension    Past Surgical History:  Procedure Laterality Date  . CARDIAC CATHETERIZATION    . cardiac stents    . CATARACT EXTRACTION W/PHACO Right 09/10/2017   Procedure: CATARACT EXTRACTION PHACO AND INTRAOCULAR LENS PLACEMENT (IOC);  Surgeon: Birder Robson, MD;  Location: [facility] ORS;  Service: Ophthalmology;  Laterality: Right;  [place] 01:01.7 AP% 15.3 CDE 9.38 Fluid pack lot # [id number]  . CATARACT EXTRACTION W/PHACO Left 10/02/2017   Procedure: CATARACT EXTRACTION PHACO AND INTRAOCULAR LENS PLACEMENT (IOC);  Surgeon: Birder Robson, MD;  Location: [facility] ORS;  Service: Ophthalmology;  Laterality: Left;  [place] 00:42.3 AP% 16.2 CDE 6.85 Fluid Pack Lot # [id number]  . COLONOSCOPY WITH PROPOFOL N/A 06/28/2017   Procedure: COLONOSCOPY WITH PROPOFOL;  Surgeon: Manya Silvas, MD;  Location: [facility] ENDOSCOPY;  Service: Endoscopy;  Laterality:  N/A;  . CORONARY ANGIOPLASTY     STENTS X 2  . KNEE ARTHROSCOPY     Family History  Problem Relation Age of Onset  . Heart disease Mother   . Diabetes Mother   . Heart disease Father   . Diabetes Father    Social History   Socioeconomic History  . Marital status: Married    Spouse name: Not on file  . Number of children: Not on file  . Years of education: Not on file  . Highest education level: Not on file  Occupational History  . Not on file  Tobacco Use  . Smoking status: Former Smoker    Packs/day: 1.00    Years: 40.00    Pack years: 40.00    Types: Cigarettes    Quit date: 2000    Years since quitting: 21.3  . Smokeless tobacco: Never Used  Substance and Sexual Activity  . Alcohol use: No  . Drug use: No  . Sexual activity: Not on file  Other Topics Concern  . Not on file  Social History Narrative  . Not on file   Social Determinants of Health   Financial Resource Strain:   . Difficulty of Paying Living Expenses:   Food Insecurity:   . Worried About Charity fundraiser in the Last Year:   . Arboriculturist in the Last Year:   Transportation Needs:   . Film/video editor (Medical):   Marland Kitchen  Lack of Transportation (Non-Medical):   Physical Activity:   . Days of Exercise per Week:   . Minutes of Exercise per Session:   Stress:   . Feeling of Stress :   Social Connections:   . Frequency of Communication with Friends and Family:   . Frequency of Social Gatherings with Friends and Family:   . Attends Religious Services:   . Active Member of Clubs or Organizations:   . Attends Archivist Meetings:   Marland Kitchen Marital Status:     Outpatient Encounter Medications as of 11/10/2019  Medication Sig  . amLODipine (NORVASC) 5 MG tablet TAKE ONE TABLET BY MOUTH EVERY DAY  . aspirin EC 81 MG tablet Take 81 mg by mouth daily.   . calcium carbonate (OS-CAL - DOSED IN MG OF ELEMENTAL CALCIUM) 1250 (500 CA) MG tablet Take 1 tablet by mouth daily.   . cetirizine  (ZYRTEC) 10 MG tablet TAKE ONE TABLET EVERY DAY  . CINNAMON PO Take 1,200 mg by mouth 2 times daily at 12 noon and 4 pm.  . diclofenac sodium (VOLTAREN) 1 % GEL Apply 2 g 2 (two) times daily topically. To affected joint.  . fluticasone (FLONASE) 50 MCG/ACT nasal spray Place 2 sprays into both nostrils daily.  Marland Kitchen GLUCOSAMINE-CHONDROITIN PO Take 1 capsule by mouth daily.  . Ipratropium-Albuterol (COMBIVENT RESPIMAT) 20-100 MCG/ACT AERS respimat Inhale 1 puff into the lungs every 6 (six) hours as needed for wheezing or shortness of breath (Cough).  . Misc Natural Products (BLACK CHERRY CONCENTRATE PO) Take 1,000 mg daily by mouth.  . Omega-3 Fatty Acids (FISH OIL) 1000 MG CAPS Take 1,000 mg by mouth daily.   Marland Kitchen omeprazole (PRILOSEC) 20 MG capsule TAKE 1 CAPSULE BY MOUTH ONCE DAILY  . sildenafil (REVATIO) 20 MG tablet TAKE 1 TO 2 TABLETS BY MOUTH EVERY DAY AS NEEDED  . simvastatin (ZOCOR) 40 MG tablet TAKE 1 TABLET BY MOUTH DAILY  . telmisartan (MICARDIS) 20 MG tablet Take 1 tablet (20 mg total) by mouth daily.  . Vitamin D, Ergocalciferol, (DRISDOL) 1.25 MG (50000 UNIT) CAPS capsule Take 1 capsule (50,000 Units total) by mouth every 7 (seven) days.   No facility-administered encounter medications on file as of 11/10/2019.   Review of Systems  Constitutional: Negative for appetite change and unexpected weight change.  HENT: Negative for congestion and sinus pressure.   Respiratory: Negative for cough, chest tightness and shortness of breath.   Cardiovascular: Negative for chest pain, palpitations and leg swelling.  Gastrointestinal: Negative for abdominal pain, diarrhea, nausea and vomiting.  Genitourinary: Negative for difficulty urinating and dysuria.  Musculoskeletal: Negative for joint swelling and myalgias.  Skin: Negative for color change and rash.  Neurological: Negative for dizziness, light-headedness and headaches.  Psychiatric/Behavioral: Negative for agitation and dysphoric mood.        Objective:    Physical Exam Constitutional:      General: He is not in acute distress.    Appearance: Normal appearance. He is well-developed.  HENT:     Head: Normocephalic and atraumatic.     Right Ear: External ear normal.     Left Ear: External ear normal.  Eyes:     General: No scleral icterus.       Right eye: No discharge.        Left eye: No discharge.     Conjunctiva/sclera: Conjunctivae normal.  Cardiovascular:     Rate and Rhythm: Normal rate and regular rhythm.     Comments: Left  carotid bruit.  Pulmonary:     Effort: Pulmonary effort is normal. No respiratory distress.     Breath sounds: Normal breath sounds.  Abdominal:     General: Bowel sounds are normal.     Palpations: Abdomen is soft.     Tenderness: There is no abdominal tenderness.  Musculoskeletal:        General: No swelling or tenderness.     Cervical back: Neck supple. No tenderness.  Lymphadenopathy:     Cervical: No cervical adenopathy.  Skin:    Findings: No erythema or rash.  Neurological:     Mental Status: He is alert.  Psychiatric:        Mood and Affect: Mood normal.        Behavior: Behavior normal.     BP 104/60   Pulse 71   Temp (!) 96.6 F (35.9 C)   Resp 16   Ht '5\' 6"'  (1.676 m)   Wt 155 lb (70.3 kg)   SpO2 97%   BMI 25.02 kg/m  Wt Readings from Last 3 Encounters:  11/10/19 155 lb (70.3 kg)  10/23/19 156 lb 6.4 oz (70.9 kg)  09/30/19 158 lb (71.7 kg)     Lab Results  Component Value Date   WBC 6.7 07/03/2019   HGB 15.6 07/03/2019   HCT 45.6 07/03/2019   PLT 285 07/03/2019   GLUCOSE 108 (H) 11/06/2019   CHOL 155 11/06/2019   TRIG 96.0 11/06/2019   HDL 43.10 11/06/2019   LDLCALC 93 11/06/2019   ALT 18 11/06/2019   AST 20 11/06/2019   NA 139 11/06/2019   K 4.2 11/06/2019   CL 104 11/06/2019   CREATININE 0.93 11/06/2019   BUN 15 11/06/2019   CO2 26 11/06/2019   TSH 2.26 03/19/2019   PSA 1.39 12/16/2017   HGBA1C 5.9 11/06/2019    DG Chest 2  View  Result Date: 07/03/2019 CLINICAL DATA:  Cough and chest congestion for approximately 4 months. EXAM: CHEST - 2 VIEW COMPARISON:  01/06/2018 from [place]: The heart size and mediastinal contours are within normal limits. Aortic atherosclerosis. Pulmonary interstitial prominence and mild hyperinflation again seen, consistent with COPD. No evidence of pulmonary infiltrate or edema. No evidence of pleural effusion. The visualized skeletal structures are unremarkable. IMPRESSION: COPD.  No active cardiopulmonary disease. Electronically Signed   By: Marlaine Hind M.D.   On: 07/03/2019 08:28       Assessment & Plan:   Problem List Items Addressed This Visit    Acid reflux    Controlled on prilosec.        Arteriosclerosis of coronary artery    Has known CAD.  Has previously been followed by Dr Ubaldo Glassing.  Was released from regular follow up.  Continue risk factor modification.  Currently without symptoms.        Benign essential HTN - Primary    Off ramipril.  On micardis.  Blood pressure doing well.  Recheck - 118/62.  Follow pressures.  Follow metabolic panel.        Relevant Orders   TSH   Basic metabolic panel   Cough    Off ramipril.  Persistent cough.  Continue f/u with pulmonary.        HLD (hyperlipidemia)    On simvastatin.  Low cholesterol diet and exercise.  Follow lipid panel and liver function tests.        Relevant Orders   Lipid panel   Hepatic function panel   Hyperglycemia  Low carb diet and exercise.  Follow met b and a1c.       Relevant Orders   Hemoglobin A1c   Left carotid bruit    Noted on exam.  Schedule carotid ultrasound with [facility].        Relevant Orders   VAS US CAROTID       Einar Pheasant, MD

## 2019-11-15 ENCOUNTER — Encounter: Payer: Self-pay | Admitting: Internal Medicine

## 2019-11-15 NOTE — Assessment & Plan Note (Signed)
Noted on exam.  Schedule carotid ultrasound with [facility].

## 2019-11-15 NOTE — Assessment & Plan Note (Signed)
Off ramipril.  Persistent cough.  Continue f/u with pulmonary.

## 2019-11-15 NOTE — Assessment & Plan Note (Signed)
Low carb diet and exercise.  Follow met b and a1c.  

## 2019-11-15 NOTE — Assessment & Plan Note (Signed)
Controlled on prilosec.   

## 2019-11-15 NOTE — Assessment & Plan Note (Signed)
On simvastatin.  Low cholesterol diet and exercise.  Follow lipid panel and liver function tests.   

## 2019-11-15 NOTE — Assessment & Plan Note (Signed)
Off ramipril.  On micardis.  Blood pressure doing well.  Recheck - 118/62.  Follow pressures.  Follow metabolic panel.

## 2019-11-15 NOTE — Assessment & Plan Note (Signed)
Has known CAD.  Has previously been followed by Dr Ubaldo Glassing.  Was released from regular follow up.  Continue risk factor modification.  Currently without symptoms.

## 2019-11-16 ENCOUNTER — Other Ambulatory Visit: Payer: Self-pay

## 2019-11-16 ENCOUNTER — Encounter: Payer: Self-pay | Admitting: Internal Medicine

## 2019-11-16 ENCOUNTER — Other Ambulatory Visit: Payer: Self-pay | Admitting: Internal Medicine

## 2019-11-16 MED ORDER — TELMISARTAN 20 MG PO TABS: 20.0000 mg | ORAL_TABLET | Freq: Every day | ORAL | 1 refills | Status: DC

## 2019-11-27 ENCOUNTER — Other Ambulatory Visit: Payer: Self-pay

## 2019-11-27 ENCOUNTER — Encounter: Payer: Self-pay | Admitting: Family Medicine

## 2019-11-27 ENCOUNTER — Ambulatory Visit (INDEPENDENT_AMBULATORY_CARE_PROVIDER_SITE_OTHER): Payer: PPO

## 2019-11-27 ENCOUNTER — Ambulatory Visit: Payer: PPO | Admitting: Family Medicine

## 2019-11-27 VITALS — BP 124/72 | HR 61 | Ht 66.0 in

## 2019-11-27 DIAGNOSIS — M19011 Primary osteoarthritis, right shoulder: Secondary | ICD-10-CM | POA: Diagnosis not present

## 2019-11-27 DIAGNOSIS — M25511 Pain in right shoulder: Secondary | ICD-10-CM

## 2019-11-27 DIAGNOSIS — M1712 Unilateral primary osteoarthritis, left knee: Secondary | ICD-10-CM

## 2019-11-27 DIAGNOSIS — G8929 Other chronic pain: Secondary | ICD-10-CM | POA: Diagnosis not present

## 2019-11-27 NOTE — Patient Instructions (Addendum)
Injected AC joint and knee today See me again in 10 weeks

## 2019-11-27 NOTE — Assessment & Plan Note (Signed)
Patient has done really well with conservative therapy previously but now is because of her chronic condition with exacerbation.  The steroid injection was given today.  Tolerated the procedure well.  Discussed icing regimen and home exercise, which activities to do which wants to avoid.  Increase activity slowly over the course the next several weeks.  Follow-up again in 4 to 8 weeks

## 2019-11-27 NOTE — Progress Notes (Signed)
[facility] [place] Phone: [phone] Subjective:   James Peck, am serving as a scribe for Dr. Hulan Saas. This visit occurred during the SARS-CoV-2 public health emergency.  Safety protocols were in place, including screening questions prior to the visit, additional usage of staff PPE, and extensive cleaning of exam room while observing appropriate contact time as indicated for disinfecting solutions.   I'm seeing this patient by the request  of:  Einar Pheasant, MD  CC: Right shoulder and left knee pain  [id number]   09/30/2019 Patient will have chronic problems with this.  Peck significant exacerbation but some mild reaccumulation of the swelling again.  Discussed the possibility of need for repeating injections or potential surgical intervention if it worsens significantly.  Patient will follow-up again in 4 to 8 weeks.  Update 11/27/2019 James Peck is a 79 y.o. male coming in with complaint of right AC joint arthritis. Patient states that his shoulder continues to bother him on a constant basis.   Also having left knee pain and would like to get injection.  Patient has had known arthritis in this knee for quite some time.  Started having worsening pain again.  Patient states affecting daily activities.  Increasing instability as well.    Past Medical History:  Diagnosis Date  . Allergy   . Arthritis   . Coronary artery disease   . GERD (gastroesophageal reflux disease)   . History of hiatal hernia   . Hypercholesteremia   . Hypertension    Past Surgical History:  Procedure Laterality Date  . CARDIAC CATHETERIZATION    . cardiac stents    . CATARACT EXTRACTION W/PHACO Right 09/10/2017   Procedure: CATARACT EXTRACTION PHACO AND INTRAOCULAR LENS PLACEMENT (IOC);  Surgeon: Birder Robson, MD;  Location: [facility] ORS;  Service: Ophthalmology;  Laterality: Right;  [place] 01:01.7 AP% 15.3 CDE 9.38 Fluid pack lot  # [id number]  . CATARACT EXTRACTION W/PHACO Left 10/02/2017   Procedure: CATARACT EXTRACTION PHACO AND INTRAOCULAR LENS PLACEMENT (IOC);  Surgeon: Birder Robson, MD;  Location: [facility] ORS;  Service: Ophthalmology;  Laterality: Left;  [place] 00:42.3 AP% 16.2 CDE 6.85 Fluid Pack Lot # [id number]  . COLONOSCOPY WITH PROPOFOL N/A 06/28/2017   Procedure: COLONOSCOPY WITH PROPOFOL;  Surgeon: Manya Silvas, MD;  Location: [facility];  Service: Endoscopy;  Laterality: N/A;  . CORONARY ANGIOPLASTY     STENTS X 2  . KNEE ARTHROSCOPY     Social History   Socioeconomic History  . Marital status: Married    Spouse name: Not on file  . Number of children: Not on file  . Years of education: Not on file  . Highest education level: Not on file  Occupational History  . Not on file  Tobacco Use  . Smoking status: Former Smoker    Packs/day: 1.00    Years: 40.00    Pack years: 40.00    Types: Cigarettes    Quit date: 2000    Years since quitting: 21.3  . Smokeless tobacco: Never Used  Substance and Sexual Activity  . Alcohol use: Peck  . Drug use: Peck  . Sexual activity: Not on file  Other Topics Concern  . Not on file  Social History Narrative  . Not on file   Social Determinants of Health   Financial Resource Strain:   . Difficulty of Paying Living Expenses:   Food Insecurity:   . Worried About Charity fundraiser  in the Last Year:   . [place] in the Last Year:   Transportation Needs:   . Film/video editor (Medical):   Marland Kitchen Lack of Transportation (Non-Medical):   Physical Activity:   . Days of Exercise per Week:   . Minutes of Exercise per Session:   Stress:   . Feeling of Stress :   Social Connections:   . Frequency of Communication with Friends and Family:   . Frequency of Social Gatherings with Friends and Family:   . Attends Religious Services:   . Active Member of Clubs or Organizations:   . Attends Archivist Meetings:   Marland Kitchen Marital Status:    Peck  Known Allergies Family History  Problem Relation Age of Onset  . Heart disease Mother   . Diabetes Mother   . Heart disease Father   . Diabetes Father      Current Outpatient Medications (Cardiovascular):  .  amLODipine (NORVASC) 5 MG tablet, TAKE ONE TABLET BY MOUTH EVERY DAY .  sildenafil (REVATIO) 20 MG tablet, TAKE 1 TO 2 TABLETS BY MOUTH EVERY DAY AS NEEDED .  simvastatin (ZOCOR) 40 MG tablet, TAKE 1 TABLET BY MOUTH DAILY .  telmisartan (MICARDIS) 20 MG tablet, Take 1 tablet (20 mg total) by mouth daily.  Current Outpatient Medications (Respiratory):  .  cetirizine (ZYRTEC) 10 MG tablet, TAKE ONE TABLET EVERY DAY .  fluticasone (FLONASE) 50 MCG/ACT nasal spray, Place 2 sprays into both nostrils daily. .  Ipratropium-Albuterol (COMBIVENT RESPIMAT) 20-100 MCG/ACT AERS respimat, Inhale 1 puff into the lungs every 6 (six) hours as needed for wheezing or shortness of breath (Cough).  Current Outpatient Medications (Analgesics):  .  aspirin EC 81 MG tablet, Take 81 mg by mouth daily.    Current Outpatient Medications (Other):  .  calcium carbonate (OS-CAL - DOSED IN MG OF ELEMENTAL CALCIUM) 1250 (500 CA) MG tablet, Take 1 tablet by mouth daily.  Marland Kitchen  CINNAMON PO, Take 1,200 mg by mouth 2 times daily at 12 noon and 4 pm. .  diclofenac sodium (VOLTAREN) 1 % GEL, Apply 2 g 2 (two) times daily topically. To affected joint. Marland Kitchen  GLUCOSAMINE-CHONDROITIN PO, Take 1 capsule by mouth daily. .  Misc Natural Products (BLACK CHERRY CONCENTRATE PO), Take 1,000 mg daily by mouth. .  Omega-3 Fatty Acids (FISH OIL) 1000 MG CAPS, Take 1,000 mg by mouth daily.  Marland Kitchen  omeprazole (PRILOSEC) 20 MG capsule, TAKE 1 CAPSULE BY MOUTH ONCE DAILY .  Vitamin D, Ergocalciferol, (DRISDOL) 1.25 MG (50000 UNIT) CAPS capsule, Take 1 capsule (50,000 Units total) by mouth every 7 (seven) days.   Reviewed prior external information including notes and imaging from  primary care provider As well as notes that were  available from care everywhere and other healthcare systems.  Past medical history, social, surgical and family history all reviewed in electronic medical record.  Peck pertanent information unless stated regarding to the chief complaint.   Review of Systems:  Peck headache, visual changes, nausea, vomiting, diarrhea, constipation, dizziness, abdominal pain, skin rash, fevers, chills, night sweats, weight loss, swollen lymph nodes, body aches, joint swelling, chest pain, shortness of breath, mood changes. POSITIVE muscle aches  Objective  Blood pressure 124/72, pulse 61, height 5\' 6"  (1.676 m), SpO2 99 %.   General: Peck apparent distress alert and oriented x3 mood and affect normal, dressed appropriately.  HEENT: Pupils equal, extraocular movements intact  Respiratory: Patient's speak in full sentences and does not appear  short of breath  Cardiovascular: Peck lower extremity edema, non tender, Peck erythema  Neuro: Cranial nerves II through XII are intact, neurovascularly intact in all extremities with 2+ DTRs and 2+ pulses.  Gait normal with good balance and coordination.  MSK: Right shoulder exam shows the patient has significant swelling over the acromioclavicular joint noted.  Patient does have still some weakness of the rotator cuff minimally.  Does have some very mild loss of range of motion of the glenohumeral joint.  Knee: Left valgus deformity noted.  Abnormal thigh to calf ratio.  Tender to palpation over medial and PF joint line.  ROM full in flexion and extension and lower leg rotation. instability with valgus force.  painful patellar compression. Patellar glide with moderate crepitus. Patellar and quadriceps tendons unremarkable. Hamstring and quadriceps strength is normal.  Procedure: Real-time Ultrasound Guided Injection of right acromioclavicular joint Device: GE Logiq Q7 Ultrasound guided injection is preferred based studies that show increased duration, increased effect,  greater accuracy, decreased procedural pain, increased response rate, and decreased cost with ultrasound guided versus blind injection.  Verbal informed consent obtained.  Time-out conducted.  Noted Peck overlying erythema, induration, or other signs of local infection.  Skin prepped in a sterile fashion.  Local anesthesia: Topical Ethyl chloride.  With sterile technique and under real time ultrasound guidance: With a 25-gauge half inch needle injected with 0.5 cc of 0.5% Marcaine and 0.5 cc of Kenalog 40 mg/mL Completed without difficulty  Pain immediately resolved suggesting accurate placement of the medication.  Advised to call if fevers/chills, erythema, induration, drainage, or persistent bleeding.  Images permanently stored and available for review in the ultrasound unit.  Impression: Technically successful ultrasound guided injection.  After informed written and verbal consent, patient was seated on exam table. Left knee was prepped with alcohol swab and utilizing anterolateral approach, patient's left knee space was injected with 4:1  marcaine 0.5%: Kenalog 40mg /dL. Patient tolerated the procedure well without immediate complications.    Impression and Recommendations:     This case required medical decision making of moderate complexity. The above documentation has been reviewed and is accurate and complete James Pulley, DO       Note: This dictation was prepared with Dragon dictation along with smaller phrase technology. Any transcriptional errors that result from this process are unintentional.

## 2019-11-27 NOTE — Assessment & Plan Note (Signed)
Repeat injection given November 27, 2019.  Optimistic that this should make some more improvement.  Discussed icing regimen.  Has had a rotator cuff tear.  We may need to consider the possibility of an intra-articular glenohumeral injection at follow-up if continuing to have this chronic pain.  Patient will avoid any surgical intervention and can still do daily activities just fine and sleeping well at night.

## 2019-12-07 ENCOUNTER — Encounter: Payer: Self-pay | Admitting: Internal Medicine

## 2019-12-08 ENCOUNTER — Telehealth: Payer: Self-pay | Admitting: Internal Medicine

## 2019-12-08 NOTE — Telephone Encounter (Signed)
LMTCB

## 2019-12-08 NOTE — Telephone Encounter (Signed)
Pt has blood in urine but no other symptoms. Please advise on cellphone

## 2019-12-08 NOTE — Telephone Encounter (Signed)
He was referred for ultrasound only.   The order is under CV - vascular ultrasound.  If there is a problem, let me know.

## 2019-12-08 NOTE — Telephone Encounter (Signed)
I left pt a vm with appt date time and number to [facility].

## 2019-12-09 DIAGNOSIS — R319 Hematuria, unspecified: Secondary | ICD-10-CM | POA: Diagnosis not present

## 2019-12-09 NOTE — Telephone Encounter (Signed)
Pt called back returning your call °

## 2019-12-09 NOTE — Telephone Encounter (Signed)
Patient stated he noticed large amount of blood in his urine 2 nights ago and since then has noticed small amounts of blood in his urine every time he urinates. No other symptoms. Patient is going to urgent care to be evaluated.

## 2019-12-09 NOTE — Telephone Encounter (Signed)
LMTCB

## 2019-12-10 ENCOUNTER — Other Ambulatory Visit: Payer: Self-pay

## 2019-12-10 ENCOUNTER — Ambulatory Visit (INDEPENDENT_AMBULATORY_CARE_PROVIDER_SITE_OTHER): Payer: PPO

## 2019-12-10 DIAGNOSIS — R0989 Other specified symptoms and signs involving the circulatory and respiratory systems: Secondary | ICD-10-CM | POA: Diagnosis not present

## 2019-12-11 ENCOUNTER — Encounter: Payer: Self-pay | Admitting: Internal Medicine

## 2019-12-29 ENCOUNTER — Encounter: Payer: Self-pay | Admitting: Pulmonary Disease

## 2019-12-29 ENCOUNTER — Ambulatory Visit: Payer: PPO | Admitting: Pulmonary Disease

## 2019-12-29 ENCOUNTER — Other Ambulatory Visit: Payer: Self-pay

## 2019-12-29 VITALS — BP 112/80 | HR 75 | Temp 97.1°F | Ht 64.57 in | Wt 157.8 lb

## 2019-12-29 DIAGNOSIS — T464X5A Adverse effect of angiotensin-converting-enzyme inhibitors, initial encounter: Secondary | ICD-10-CM

## 2019-12-29 DIAGNOSIS — R05 Cough: Secondary | ICD-10-CM

## 2019-12-29 NOTE — Patient Instructions (Signed)
We will see you as needed.  Call us back if you notice a have to start using the inhaler frequently or if your symptoms recur.

## 2019-12-29 NOTE — Progress Notes (Signed)
    Assessment & Plan:  1. Cough due to ACE inhibitor (Primary)   Patient Instructions  We will see you as needed.  Call us  back if you notice a have to start using the inhaler frequently or if your symptoms recur.  Please note: late entry documentation due to logistical difficulties during COVID-19 pandemic. This note is filed for information purposes only, and is not intended to be used for billing, nor does it represent the full scope/nature of the visit in question. Please see any associated scanned media linked to date of encounter for additional pertinent information.  Subjective:    HPI: James Peck is a 79 y.o. male presenting to the pulmonology clinic on 12/29/2019 with report of: Follow-up (Patient is feeling better since last visit. Patient still has a little dry cough but not like it was.)     Outpatient Encounter Medications as of 12/29/2019  Medication Sig   aspirin  EC 81 MG tablet Take 81 mg by mouth daily.    calcium  carbonate (OS-CAL - DOSED IN MG OF ELEMENTAL CALCIUM ) 1250 (500 CA) MG tablet Take 1 tablet by mouth daily.    CINNAMON  PO Take 1,200 mg by mouth 2 times daily at 12 noon and 4 pm.   GLUCOSAMINE-CHONDROITIN PO Take 1 capsule by mouth daily.   Misc Natural Products (BLACK CHERRY CONCENTRATE PO) Take 1,000 mg daily by mouth.   Omega-3 Fatty Acids (FISH OIL) 1000 MG CAPS Take 1,000 mg by mouth daily.    [DISCONTINUED] amLODipine  (NORVASC ) 5 MG tablet TAKE ONE TABLET BY MOUTH EVERY DAY   [DISCONTINUED] cetirizine  (ZYRTEC ) 10 MG tablet TAKE ONE TABLET EVERY DAY   [DISCONTINUED] diclofenac  sodium (VOLTAREN ) 1 % GEL Apply 2 g 2 (two) times daily topically. To affected joint. (Patient not taking: Reported on 07/05/2021)   [DISCONTINUED] fluticasone  (FLONASE ) 50 MCG/ACT nasal spray Place 2 sprays into both nostrils daily.   [DISCONTINUED] Ipratropium-Albuterol  (COMBIVENT  RESPIMAT) 20-100 MCG/ACT AERS respimat Inhale 1 puff into the lungs every 6 (six) hours as needed  for wheezing or shortness of breath (Cough). (Patient not taking: Reported on 06/21/2020)   [DISCONTINUED] omeprazole  (PRILOSEC) 20 MG capsule TAKE 1 CAPSULE BY MOUTH ONCE DAILY   [DISCONTINUED] sildenafil  (REVATIO ) 20 MG tablet TAKE 1 TO 2 TABLETS BY MOUTH EVERY DAY AS NEEDED   [DISCONTINUED] simvastatin  (ZOCOR ) 40 MG tablet TAKE 1 TABLET BY MOUTH DAILY   [DISCONTINUED] telmisartan  (MICARDIS ) 20 MG tablet Take 1 tablet (20 mg total) by mouth daily.   [DISCONTINUED] Vitamin D , Ergocalciferol , (DRISDOL ) 1.25 MG (50000 UNIT) CAPS capsule Take 1 capsule (50,000 Units total) by mouth every 7 (seven) days.   No facility-administered encounter medications on file as of 12/29/2019.      Objective:   Vitals:   12/29/19 1414  BP: 112/80  Pulse: 75  Temp: (!) 97.1 F (36.2 C)  Height: 5' 4.57 (1.64 m)  Weight: 157 lb 12.8 oz (71.6 kg)  SpO2: 95%  TempSrc: Temporal  BMI (Calculated): 26.61     Physical exam documentation is limited by delayed entry of information.

## 2019-12-31 ENCOUNTER — Ambulatory Visit: Payer: PPO | Admitting: Urology

## 2019-12-31 ENCOUNTER — Encounter: Payer: Self-pay | Admitting: Urology

## 2019-12-31 ENCOUNTER — Other Ambulatory Visit: Payer: Self-pay

## 2019-12-31 VITALS — BP 134/65 | HR 74 | Ht 64.0 in | Wt 158.0 lb

## 2019-12-31 DIAGNOSIS — R310 Gross hematuria: Secondary | ICD-10-CM | POA: Diagnosis not present

## 2019-12-31 NOTE — Patient Instructions (Signed)

## 2019-12-31 NOTE — Progress Notes (Signed)
12/31/2019 3:05 PM   James Peck Apr 19, 1941 [id number]  Referring provider: Luna Glasgow, DO [place] [facility],   [id number]  Chief Complaint  Patient presents with  . Hematuria    HPI: James Peck is a 72 male referred for evaluation of gross hematuria.  -Episode total gross painless hematuria 12/08/2019 -Urine described as bloody without clots -Resolved in 24 hours -Was seen [facility] 5/12 and urinalysis showed 4-10 RBCs -Urine culture negative -Denied dysuria or bothersome lower urinary tract symptoms -Has baseline nocturia x2 -No prior urologic evaluation or prior urologic history -Approximately 40-pack-year smoking history quit in early 2000   PMH: Past Medical History:  Diagnosis Date  . Allergy   . Arthritis   . Coronary artery disease   . GERD (gastroesophageal reflux disease)   . History of hiatal hernia   . Hypercholesteremia   . Hypertension     Surgical History: Past Surgical History:  Procedure Laterality Date  . CARDIAC CATHETERIZATION    . cardiac stents    . CATARACT EXTRACTION W/PHACO Right 09/10/2017   Procedure: CATARACT EXTRACTION PHACO AND INTRAOCULAR LENS PLACEMENT (IOC);  Surgeon: Birder Robson, MD;  Location: [facility] ORS;  Service: Ophthalmology;  Laterality: Right;  [place] 01:01.7 AP% 15.3 CDE 9.38 Fluid pack lot # [id number]  . CATARACT EXTRACTION W/PHACO Left 10/02/2017   Procedure: CATARACT EXTRACTION PHACO AND INTRAOCULAR LENS PLACEMENT (IOC);  Surgeon: Birder Robson, MD;  Location: [facility] ORS;  Service: Ophthalmology;  Laterality: Left;  [place] 00:42.3 AP% 16.2 CDE 6.85 Fluid Pack Lot # [id number]  . COLONOSCOPY WITH PROPOFOL N/A 06/28/2017   Procedure: COLONOSCOPY WITH PROPOFOL;  Surgeon: Manya Silvas, MD;  Location: [facility]COPY;  Service: Endoscopy;  Laterality: N/A;  . CORONARY ANGIOPLASTY     STENTS X 2  . KNEE ARTHROSCOPY      Home Medications:  Allergies  as of 12/31/2019      Reactions   Ace Inhibitors    Cough      Medication List       Accurate as of December 31, 2019  3:05 PM. If you have any questions, ask your nurse or doctor.        amLODipine 5 MG tablet Commonly known as: NORVASC TAKE ONE TABLET BY MOUTH EVERY DAY   aspirin EC 81 MG tablet Take 81 mg by mouth daily.   BLACK CHERRY CONCENTRATE PO Take 1,000 mg daily by mouth.   calcium carbonate 1250 (500 Ca) MG tablet Commonly known as: OS-CAL - dosed in mg of elemental calcium Take 1 tablet by mouth daily.   cetirizine 10 MG tablet Commonly known as: ZYRTEC TAKE ONE TABLET EVERY DAY   CINNAMON PO Take 1,200 mg by mouth 2 times daily at 12 noon and 4 pm.   Combivent Respimat 20-100 MCG/ACT Aers respimat Generic drug: Ipratropium-Albuterol Inhale 1 puff into the lungs every 6 (six) hours as needed for wheezing or shortness of breath (Cough).   diclofenac sodium 1 % Gel Commonly known as: VOLTAREN Apply 2 g 2 (two) times daily topically. To affected joint.   Fish Oil 1000 MG Caps Take 1,000 mg by mouth daily.   fluticasone 50 MCG/ACT nasal spray Commonly known as: FLONASE Place 2 sprays into both nostrils daily.   GLUCOSAMINE-CHONDROITIN PO Take 1 capsule by mouth daily.   omeprazole 20 MG capsule Commonly known as: PRILOSEC TAKE 1 CAPSULE BY MOUTH ONCE DAILY   sildenafil 20 MG tablet  Commonly known as: REVATIO TAKE 1 TO 2 TABLETS BY MOUTH EVERY DAY AS NEEDED   simvastatin 40 MG tablet Commonly known as: ZOCOR TAKE 1 TABLET BY MOUTH DAILY   telmisartan 20 MG tablet Commonly known as: Micardis Take 1 tablet (20 mg total) by mouth daily.       Allergies:  Allergies  Allergen Reactions  . Ace Inhibitors     Cough    Family History: Family History  Problem Relation Age of Onset  . Heart disease Mother   . Diabetes Mother   . Heart disease Father   . Diabetes Father     Social History:  reports that he quit smoking about 21 years ago.  His smoking use included cigarettes. He has a 40.00 pack-year smoking history. He has never used smokeless tobacco. He reports that he does not drink alcohol or use drugs.   Physical Exam: BP 134/65   Pulse 74   Ht 5\' 4"  (1.626 m)   Wt 158 lb (71.7 kg)   BMI 27.12 kg/m   Constitutional:  Alert and oriented, No acute distress. HEENT: [place] AT, moist mucus membranes.  Trachea midline, no masses. Cardiovascular: No clubbing, cyanosis, or edema. Respiratory: Normal respiratory effort, no increased work of breathing. GI: Abdomen is soft, nontender, nondistended, no abdominal masses GU: Phallus without lesions, testes descended bilaterally without masses or tenderness.  Spermatic cord/epididymis palpably normal bilaterally.  Prostate 40 g, smooth without nodules Lymph: No cervical or inguinal lymphadenopathy. Skin: No rashes, bruises or suspicious lesions. Neurologic: Grossly intact, no focal deficits, moving all 4 extremities. Psychiatric: Normal mood and affect.  Laboratory Data:  Lab Results  Component Value Date   CREATININE 0.93 11/06/2019    Lab Results  Component Value Date   PSA 1.39 12/16/2017   Urinalysis Dipstick/microscopy negative  Assessment & Plan:    1. Gross hematuria -Risk stratification: High -We discussed potential causes of hematuria including both benign and malignant etiologies. -We discussed the recommended evaluation for high risk hematuria of CT urogram and cystoscopy -All questions were answered and he desires to proceed   Abbie Sons, MD  [facility] [address], [place], [place] [address] [phone]

## 2020-01-01 LAB — URINALYSIS, COMPLETE
Bilirubin, UA: NEGATIVE
Glucose, UA: NEGATIVE
Ketones, UA: NEGATIVE
Leukocytes,UA: NEGATIVE
Nitrite, UA: NEGATIVE
Protein,UA: NEGATIVE
RBC, UA: NEGATIVE
Specific Gravity, UA: 1.015 (ref 1.005–1.030)
Urobilinogen, Ur: 0.2 mg/dL (ref 0.2–1.0)
pH, UA: 6.5 (ref 5.0–7.5)

## 2020-01-01 LAB — MICROSCOPIC EXAMINATION: Bacteria, UA: NONE SEEN

## 2020-01-15 ENCOUNTER — Other Ambulatory Visit: Payer: Self-pay

## 2020-01-15 ENCOUNTER — Ambulatory Visit
Admission: RE | Admit: 2020-01-15 | Discharge: 2020-01-15 | Disposition: A | Discharge status: Home / Self Care | Payer: PPO | Source: Ambulatory Visit | Attending: Urology | Admitting: Urology

## 2020-01-15 DIAGNOSIS — R310 Gross hematuria: Secondary | ICD-10-CM | POA: Diagnosis not present

## 2020-01-15 LAB — POCT I-STAT CREATININE: Creatinine, Ser: 1 mg/dL (ref 0.61–1.24)

## 2020-01-15 MED ORDER — IOHEXOL 300 MG/ML  SOLN
125.0000 mL | Freq: Once | INTRAMUSCULAR | Status: AC | PRN
  Administered 2020-01-15: 125 mL via INTRAVENOUS

## 2020-02-04 ENCOUNTER — Encounter: Payer: Self-pay | Admitting: Internal Medicine

## 2020-02-08 ENCOUNTER — Ambulatory Visit: Payer: PPO | Admitting: Family Medicine

## 2020-02-10 ENCOUNTER — Ambulatory Visit: Payer: PPO | Admitting: Urology

## 2020-02-10 ENCOUNTER — Encounter: Payer: Self-pay | Admitting: Urology

## 2020-02-10 ENCOUNTER — Other Ambulatory Visit: Payer: Self-pay | Admitting: Urology

## 2020-02-10 ENCOUNTER — Other Ambulatory Visit: Payer: Self-pay

## 2020-02-10 VITALS — BP 113/67 | HR 79 | Ht 64.0 in | Wt 156.6 lb

## 2020-02-10 DIAGNOSIS — R310 Gross hematuria: Secondary | ICD-10-CM | POA: Diagnosis not present

## 2020-02-10 NOTE — Progress Notes (Signed)
   02/10/2020  CC:  Chief Complaint  Patient presents with  . Cysto    HPI: James Peck is a 79 y.o. male who presents for a cystoscopy. -He denies gross hematuria, and has no complaints -CT urogram showed a 11 mm proteinaceous cyst in the right kidney  Blood pressure 113/67, pulse 79, height 5\' 4"  (1.626 m), weight 156 lb 9.6 oz (71 kg). NED. A&Ox3.   No respiratory distress   Abd soft, NT, ND Normal phallus with bilateral descended testicles  Cystoscopy Procedure Note  Patient identification was confirmed, informed consent was obtained, and patient was prepped using Betadine solution.  Lidocaine jelly was administered per urethral meatus.     Pre-Procedure: - Inspection reveals a normal caliber urethral meatus.  Procedure: The flexible cystoscope was introduced without difficulty - No urethral strictures/lesions are present. - Prominent lateral lobe enlargement with hypervascularity - Normal bladder neck - Bilateral ureteral orifices identified - Bladder mucosa  reveals no ulcers, tumors, or lesions - No bladder stones -Mild trabeculation  Retroflexion shows no median lobe or tumor   Post-Procedure: - Patient tolerated the procedure well  Assessment/ Plan:  1. Gross Hematuria -Most likely prostatic in origin -Urine cytology was sent and will call with results -Follow up in six months for symptom check and repeat urinalysis  2.  Bronchial thickening on chest CT -Will message his PCP regarding follow-up of chest findings  I, [place], am acting as a scribe for Dr. Nicki Reaper C. Aleesha Ringstad.  I have reviewed the above documentation for accuracy and completeness, and I agree with the above.   Abbie Sons, MD

## 2020-02-11 ENCOUNTER — Telehealth: Payer: Self-pay | Admitting: Internal Medicine

## 2020-02-11 ENCOUNTER — Encounter: Payer: Self-pay | Admitting: Urology

## 2020-02-11 LAB — URINALYSIS, COMPLETE
Bilirubin, UA: NEGATIVE
Glucose, UA: NEGATIVE
Ketones, UA: NEGATIVE
Leukocytes,UA: NEGATIVE
Nitrite, UA: NEGATIVE
Protein,UA: NEGATIVE
Specific Gravity, UA: 1.015 (ref 1.005–1.030)
Urobilinogen, Ur: 0.2 mg/dL (ref 0.2–1.0)
pH, UA: 6 (ref 5.0–7.5)

## 2020-02-11 LAB — MICROSCOPIC EXAMINATION
Bacteria, UA: NONE SEEN
Epithelial Cells (non renal): NONE SEEN /hpf (ref 0–10)

## 2020-02-11 NOTE — Telephone Encounter (Signed)
Notify Mr James Peck that I received a notification from Dr Bernardo Heater that his CT scan revealed some changes in his lungs - thickening and some nodules that may be related to inflammation ,etc.  Need to know if he is having any acute symptoms, infections.  Can schedule a virtual appt with me Monday to discuss.  If any acute symptoms, let me know.

## 2020-02-12 LAB — CYTOLOGY - NON PAP

## 2020-02-12 NOTE — Telephone Encounter (Signed)
Patient confirmed no acute issues. He is agreeable to do telephone visit with you on Monday. Just wanted to confirm with you that you are ok to do 4:30?

## 2020-02-13 NOTE — Telephone Encounter (Signed)
Ok

## 2020-02-15 ENCOUNTER — Telehealth: Payer: Self-pay | Admitting: *Deleted

## 2020-02-15 ENCOUNTER — Telehealth (INDEPENDENT_AMBULATORY_CARE_PROVIDER_SITE_OTHER): Payer: PPO | Admitting: Internal Medicine

## 2020-02-15 DIAGNOSIS — R059 Cough, unspecified: Secondary | ICD-10-CM

## 2020-02-15 DIAGNOSIS — R05 Cough: Secondary | ICD-10-CM | POA: Diagnosis not present

## 2020-02-15 DIAGNOSIS — R935 Abnormal findings on diagnostic imaging of other abdominal regions, including retroperitoneum: Secondary | ICD-10-CM

## 2020-02-15 LAB — PATHOLOGY

## 2020-02-15 NOTE — Telephone Encounter (Signed)
Notified patient as instructed, patient pleased. Discussed follow-up appointments, patient agrees  

## 2020-02-15 NOTE — Progress Notes (Signed)
Patient ID: James Peck, male   DOB: Jan 25, 1941, 80 y.o.   MRN: [id number]   Virtual Visit via telephone Note  This visit type was conducted due to national recommendations for restrictions regarding the COVID-19 pandemic (e.g. social distancing).  This format is felt to be most appropriate for this patient at this time.  All issues noted in this document were discussed and addressed.  No physical exam was performed (except for noted visual exam findings with Video Visits).   I connected with James Peck by telephone and verified that I am speaking with the correct person using two identifiers. Location patient: home Location provider: work Persons participating in the telephone visit: patient, provider  The limitations, risks, security and privacy concerns of performing an evaluation and management service by telephone and the availability of in person appointments have been discussed.  It has also been discussed with the patient that there may be a patient responsible charge related to this service. The patient expressed understanding and agreed to proceed.   Reason for visit: work in appt  HPI: Work in appt to discuss results of recent CT scan.  Seeing Dr Bernardo Heater for w/up of hematuria.  CT abdomen and pelvis performed (for w/up as outlined) and revealed bronchial wall thickening with small airway impaction posterior right lung base with associated small focus of collapse/consolidative opacity and clustered nodularity with nodules measuring up to 71mm.  (question of atypical infection, aspiration, etc).  He reports occasional cough - no change or increase recently.  States will occasionally notice when wakes up - runny nose and some cough.  Will use inhaler and this help with the cough and nose stops running.  No increased cough or chest congestion.  No increased sob.  Breathing overall stable.  States will occasionally notice he will develop hiccups when eats, but no choking or aspiration.  No  increased acid reflux.  Quit smoking 30 years ago.  Overall feels things are stable.     ROS: See pertinent positives and negatives per HPI.  Past Medical History:  Diagnosis Date  . Allergy   . Arthritis   . Coronary artery disease   . GERD (gastroesophageal reflux disease)   . History of hiatal hernia   . Hypercholesteremia   . Hypertension     Past Surgical History:  Procedure Laterality Date  . CARDIAC CATHETERIZATION    . cardiac stents    . CATARACT EXTRACTION W/PHACO Right 09/10/2017   Procedure: CATARACT EXTRACTION PHACO AND INTRAOCULAR LENS PLACEMENT (IOC);  Surgeon: Birder Robson, MD;  Location: [facility] ORS;  Service: Ophthalmology;  Laterality: Right;  [place] 01:01.7 AP% 15.3 CDE 9.38 Fluid pack lot # [id number]  . CATARACT EXTRACTION W/PHACO Left 10/02/2017   Procedure: CATARACT EXTRACTION PHACO AND INTRAOCULAR LENS PLACEMENT (IOC);  Surgeon: Birder Robson, MD;  Location: [facility] ORS;  Service: Ophthalmology;  Laterality: Left;  [place] 00:42.3 AP% 16.2 CDE 6.85 Fluid Pack Lot # [id number]  . COLONOSCOPY WITH PROPOFOL N/A 06/28/2017   Procedure: COLONOSCOPY WITH PROPOFOL;  Surgeon: Manya Silvas, MD;  Location: [facility] ENDOSCOPY;  Service: Endoscopy;  Laterality: N/A;  . CORONARY ANGIOPLASTY     STENTS X 2  . KNEE ARTHROSCOPY      Family History  Problem Relation Age of Onset  . Heart disease Mother   . Diabetes Mother   . Heart disease Father   . Diabetes Father     SOCIAL HX: reviewed.    Current Outpatient Medications:  .  amLODipine (NORVASC)  5 MG tablet, TAKE ONE TABLET BY MOUTH EVERY DAY, Disp: 90 tablet, Rfl: 1 .  aspirin EC 81 MG tablet, Take 81 mg by mouth daily. , Disp: , Rfl:  .  calcium carbonate (OS-CAL - DOSED IN MG OF ELEMENTAL CALCIUM) 1250 (500 CA) MG tablet, Take 1 tablet by mouth daily. , Disp: , Rfl:  .  cetirizine (ZYRTEC) 10 MG tablet, TAKE ONE TABLET EVERY DAY, Disp: 90 tablet, Rfl: 1 .  CINNAMON PO, Take 1,200 mg by mouth 2 times daily at 12  noon and 4 pm., Disp: , Rfl:  .  diclofenac sodium (VOLTAREN) 1 % GEL, Apply 2 g 2 (two) times daily topically. To affected joint., Disp: 300 g, Rfl: 11 .  fluticasone (FLONASE) 50 MCG/ACT nasal spray, Place 2 sprays into both nostrils daily., Disp: 48 g, Rfl: 3 .  GLUCOSAMINE-CHONDROITIN PO, Take 1 capsule by mouth daily., Disp: , Rfl:  .  Ipratropium-Albuterol (COMBIVENT RESPIMAT) 20-100 MCG/ACT AERS respimat, Inhale 1 puff into the lungs every 6 (six) hours as needed for wheezing or shortness of breath (Cough)., Disp: 4 g, Rfl: 6 .  Misc Natural Products (BLACK CHERRY CONCENTRATE PO), Take 1,000 mg daily by mouth., Disp: , Rfl:  .  Omega-3 Fatty Acids (FISH OIL) 1000 MG CAPS, Take 1,000 mg by mouth daily. , Disp: , Rfl:  .  omeprazole (PRILOSEC) 20 MG capsule, TAKE 1 CAPSULE BY MOUTH ONCE DAILY, Disp: 90 capsule, Rfl: 1 .  sildenafil (REVATIO) 20 MG tablet, TAKE 1 TO 2 TABLETS BY MOUTH EVERY DAY AS NEEDED, Disp: 60 tablet, Rfl: 0 .  simvastatin (ZOCOR) 40 MG tablet, TAKE 1 TABLET BY MOUTH DAILY, Disp: 90 tablet, Rfl: 1 .  telmisartan (MICARDIS) 20 MG tablet, Take 1 tablet (20 mg total) by mouth daily., Disp: 90 tablet, Rfl: 1  EXAM:  GENERAL: alert.  Sounds to be in no acute distress.  Answering questions appropriately.    PSYCH/NEURO: pleasant and cooperative, no obvious depression or anxiety, speech and thought processing grossly intact  ASSESSMENT AND PLAN:  Discussed the following assessment and plan:  Abnormal abdominal CT scan CT scan as outlined.  Bronchial wall thickening and changes as outlined.  Currently with no active infection.  Has history of cough and seeing pulmonary.  Uses an inhaler prn.  Discussed further w/up and evaluation.  No acute symptoms.  Hold on abx at this time.  Discussed obtaining chest CT given changes noted on abdominal/pelvic CT.  D/w pulmonary.  Pt in agreement.    Cough Off ramipril.  Still with persistent intermittent cough as outlined.  No acute  symptoms.  CT as outlined.  D/w pulmonary regarding further w/up and evaluation.    No orders of the defined types were placed in this encounter.   No orders of the defined types were placed in this encounter.    I discussed the assessment and treatment plan with the patient. The patient was provided an opportunity to ask questions and all were answered. The patient agreed with the plan and demonstrated an understanding of the instructions.   The patient was advised to call back or seek an in-person evaluation if the symptoms worsen or if the condition fails to improve as anticipated.  I provided 18 minutes of non-face-to-face time during this encounter.   Einar Pheasant, MD

## 2020-02-15 NOTE — Telephone Encounter (Signed)
-----   Message from Abbie Sons, MD sent at 02/14/2020 10:23 AM EDT ----- Urine cytology showed no abnormal cells

## 2020-02-15 NOTE — Telephone Encounter (Signed)
Pt scheduled  

## 2020-02-16 ENCOUNTER — Encounter: Payer: Self-pay | Admitting: Internal Medicine

## 2020-02-16 DIAGNOSIS — R935 Abnormal findings on diagnostic imaging of other abdominal regions, including retroperitoneum: Secondary | ICD-10-CM | POA: Insufficient documentation

## 2020-02-16 NOTE — Assessment & Plan Note (Signed)
Off ramipril.  Still with persistent intermittent cough as outlined.  No acute symptoms.  CT as outlined.  D/w pulmonary regarding further w/up and evaluation.

## 2020-02-16 NOTE — Assessment & Plan Note (Signed)
CT scan as outlined.  Bronchial wall thickening and changes as outlined.  Currently with no active infection.  Has history of cough and seeing pulmonary.  Uses an inhaler prn.  Discussed further w/up and evaluation.  No acute symptoms.  Hold on abx at this time.  Discussed obtaining chest CT given changes noted on abdominal/pelvic CT.  D/w pulmonary.  Pt in agreement.

## 2020-02-18 ENCOUNTER — Telehealth: Payer: Self-pay | Admitting: Internal Medicine

## 2020-02-18 ENCOUNTER — Telehealth: Payer: Self-pay | Admitting: *Deleted

## 2020-02-18 NOTE — Telephone Encounter (Signed)
Patient seen in my chart  

## 2020-02-18 NOTE — Telephone Encounter (Signed)
-----   Message from Abbie Sons, MD sent at 02/17/2020  9:32 PM EDT ----- Urine cytology showed no abnormal cells

## 2020-02-18 NOTE — Telephone Encounter (Signed)
Pt scheduled appt with me to discuss.  See office note.  Consulted with pulmonary as well. Plan for f/u cxr.

## 2020-02-18 NOTE — Telephone Encounter (Signed)
-----   Message from Abbie Sons, MD sent at 02/11/2020 10:05 AM EDT ----- Regarding: CT Good morning Roneka Gilpin,  Mr. Ohern was referred to me after being seen at [facility] acute care for gross hematuria.  CT urogram and cystoscopy were unremarkable and his hematuria was most likely secondary to BPH.  He did have bronchial thickening and the radiologist mention consider chest CT follow-up.  If you do not mind I will defer to you regarding any additional evaluation.  Thanks, [employer]

## 2020-02-22 ENCOUNTER — Telehealth: Payer: Self-pay | Admitting: Internal Medicine

## 2020-02-22 ENCOUNTER — Encounter: Payer: Self-pay | Admitting: Internal Medicine

## 2020-02-22 DIAGNOSIS — R935 Abnormal findings on diagnostic imaging of other abdominal regions, including retroperitoneum: Secondary | ICD-10-CM

## 2020-02-22 NOTE — Telephone Encounter (Signed)
-----   Message from Tyler Pita, MD sent at 02/16/2020  6:14 AM EDT ----- Regarding: RE: update and question I would do a CXR if not already done, to exclude major issues. Proceed with CT if suggested by CXR,  but it looks like he has chronic bronchitis/asthmatic bronchitis. An inhaler with ICS may help. Next step would be PFTs . The bronchial thickening is likely airway inflammation. We should evaluate him fully at some point for his cough.  L. ----- Message ----- From: Einar Pheasant, MD Sent: 02/16/2020   6:02 AM EDT To: Tyler Pita, MD Subject: update and question                            Mr Heikkila has been seeing Dr Bernardo Heater for w/up regarding hematuria.  Had abdominal and pelvic CT scan that revealed bronchial wall thickening, etc - lungs.  He has chronic cough - still present after stopping ace inhibitor.  Uses inhaler prn.  Discussed with him regarding obtaining CT chest.  Radiology recommended f/u chest CT in 3 months after treatment.  He is not having any active symptoms.  I did not prescribe abx, etc.  I was considering chest CT now - to fully visualize and evaluate the lungs (given this was CT abdomen and pelvis).  I wanted to get your input.  If you need to see him to determine further w/up, just let me know and we can arrange an appt.  Thank you for your help.    Azalyn Sliwa

## 2020-02-22 NOTE — Telephone Encounter (Signed)
Notify pt that I discussed with Dr Patsey Berthold further w/up regarding the lung findings on his abdominal and pelvic CT.  She recommended a f/u cxr first.  I would like for him to come in for cxr.  I will place order.  Also needs a f/u with Dr Patsey Berthold for further w/up (possible PFTs, etc). Does he have f/u scheduled?  If not, will need to schedule.

## 2020-02-23 ENCOUNTER — Other Ambulatory Visit: Payer: Self-pay

## 2020-02-24 NOTE — Telephone Encounter (Signed)
Patient is coming in Friday for cxr. Does not have appt with Dr Patsey Berthold. He will call to schedule.

## 2020-02-25 ENCOUNTER — Ambulatory Visit (INDEPENDENT_AMBULATORY_CARE_PROVIDER_SITE_OTHER): Payer: PPO

## 2020-02-25 ENCOUNTER — Other Ambulatory Visit: Payer: Self-pay

## 2020-02-25 ENCOUNTER — Other Ambulatory Visit: Payer: PPO

## 2020-02-25 DIAGNOSIS — R935 Abnormal findings on diagnostic imaging of other abdominal regions, including retroperitoneum: Secondary | ICD-10-CM | POA: Diagnosis not present

## 2020-02-25 DIAGNOSIS — J449 Chronic obstructive pulmonary disease, unspecified: Secondary | ICD-10-CM | POA: Diagnosis not present

## 2020-02-26 ENCOUNTER — Telehealth: Payer: Self-pay

## 2020-02-26 NOTE — Telephone Encounter (Signed)
See result note message. Pt is aware.

## 2020-02-26 NOTE — Telephone Encounter (Signed)
Pt called back returning your call °

## 2020-02-26 NOTE — Telephone Encounter (Signed)
LMTCB in regards to lab results.  

## 2020-03-01 ENCOUNTER — Other Ambulatory Visit: Payer: Self-pay | Admitting: Internal Medicine

## 2020-03-06 ENCOUNTER — Emergency Department
Admission: EM | Admit: 2020-03-06 | Discharge: 2020-03-06 | Disposition: A | Discharge status: Home / Self Care | Payer: PPO | Attending: Emergency Medicine | Admitting: Emergency Medicine

## 2020-03-06 ENCOUNTER — Other Ambulatory Visit: Payer: Self-pay

## 2020-03-06 ENCOUNTER — Emergency Department: Payer: PPO

## 2020-03-06 ENCOUNTER — Encounter: Payer: Self-pay | Admitting: Emergency Medicine

## 2020-03-06 DIAGNOSIS — R0602 Shortness of breath: Secondary | ICD-10-CM | POA: Diagnosis not present

## 2020-03-06 DIAGNOSIS — R0981 Nasal congestion: Secondary | ICD-10-CM | POA: Insufficient documentation

## 2020-03-06 DIAGNOSIS — R05 Cough: Secondary | ICD-10-CM | POA: Insufficient documentation

## 2020-03-06 DIAGNOSIS — J449 Chronic obstructive pulmonary disease, unspecified: Secondary | ICD-10-CM | POA: Diagnosis not present

## 2020-03-06 DIAGNOSIS — Z03818 Encounter for observation for suspected exposure to other biological agents ruled out: Secondary | ICD-10-CM | POA: Diagnosis not present

## 2020-03-06 DIAGNOSIS — Z5321 Procedure and treatment not carried out due to patient leaving prior to being seen by health care provider: Secondary | ICD-10-CM | POA: Insufficient documentation

## 2020-03-06 LAB — BASIC METABOLIC PANEL
Anion gap: 10 (ref 5–15)
BUN: 13 mg/dL (ref 8–23)
CO2: 27 mmol/L (ref 22–32)
Calcium: 9.4 mg/dL (ref 8.9–10.3)
Chloride: 99 mmol/L (ref 98–111)
Creatinine, Ser: 0.91 mg/dL (ref 0.61–1.24)
GFR calc Af Amer: >60 mL/min (ref >60)
GFR calc non Af Amer: >60 mL/min (ref >60)
Glucose, Bld: 142 mg/dL — ABNORMAL HIGH (ref 70–99)
Potassium: 4.3 mmol/L (ref 3.5–5.1)
Sodium: 136 mmol/L (ref 135–145)

## 2020-03-06 LAB — CBC WITH DIFFERENTIAL/PLATELET
Abs Immature Granulocytes: 0.04 10*3/uL (ref 0.00–0.07)
Basophils Absolute: 0 10*3/uL (ref 0.0–0.1)
Basophils Relative: 1 %
Eosinophils Absolute: 0.4 10*3/uL (ref 0.0–0.5)
Eosinophils Relative: 5 %
HCT: 44.8 % (ref 39.0–52.0)
Hemoglobin: 15.2 g/dL (ref 13.0–17.0)
Immature Granulocytes: 1 %
Lymphocytes Relative: 23 %
Lymphs Abs: 1.8 10*3/uL (ref 0.7–4.0)
MCH: 31.3 pg (ref 26.0–34.0)
MCHC: 33.9 g/dL (ref 30.0–36.0)
MCV: 92.4 fL (ref 80.0–100.0)
Monocytes Absolute: 0.9 10*3/uL (ref 0.1–1.0)
Monocytes Relative: 12 %
Neutro Abs: 4.6 10*3/uL (ref 1.7–7.7)
Neutrophils Relative %: 58 %
Platelets: 283 10*3/uL (ref 150–400)
RBC: 4.85 MIL/uL (ref 4.22–5.81)
RDW: 13.1 % (ref 11.5–15.5)
WBC: 7.8 10*3/uL (ref 4.0–10.5)
nRBC: 0 % (ref 0.0–0.2)

## 2020-03-06 NOTE — ED Triage Notes (Signed)
Pt to ED via POV c/o shortness of breath, cough, and congestion. Pt states that he has had sx's x 2-3 days. Pt reports that her had a CXR done last week and was told that it was normal. Pt states that he is coughing up white phlegm. Pt is in NAD.

## 2020-03-07 ENCOUNTER — Other Ambulatory Visit: Payer: Self-pay | Admitting: Internal Medicine

## 2020-03-15 ENCOUNTER — Other Ambulatory Visit (INDEPENDENT_AMBULATORY_CARE_PROVIDER_SITE_OTHER): Payer: PPO

## 2020-03-15 ENCOUNTER — Other Ambulatory Visit: Payer: Self-pay

## 2020-03-15 DIAGNOSIS — E785 Hyperlipidemia, unspecified: Secondary | ICD-10-CM | POA: Diagnosis not present

## 2020-03-15 DIAGNOSIS — R739 Hyperglycemia, unspecified: Secondary | ICD-10-CM

## 2020-03-15 DIAGNOSIS — I10 Essential (primary) hypertension: Secondary | ICD-10-CM | POA: Diagnosis not present

## 2020-03-15 LAB — LIPID PANEL
Cholesterol: 142 mg/dL (ref 0–200)
HDL: 42.3 mg/dL (ref >39.00)
LDL Cholesterol: 76 mg/dL (ref 0–99)
NonHDL: 99.6
Total CHOL/HDL Ratio: 3
Triglycerides: 118 mg/dL (ref 0.0–149.0)
VLDL: 23.6 mg/dL (ref 0.0–40.0)

## 2020-03-15 LAB — BASIC METABOLIC PANEL
BUN: 13 mg/dL (ref 6–23)
CO2: 30 mEq/L (ref 19–32)
Calcium: 9.6 mg/dL (ref 8.4–10.5)
Chloride: 99 mEq/L (ref 96–112)
Creatinine, Ser: 0.97 mg/dL (ref 0.40–1.50)
GFR: 74.75 mL/min (ref >60.00)
Glucose, Bld: 104 mg/dL — ABNORMAL HIGH (ref 70–99)
Potassium: 4.4 mEq/L (ref 3.5–5.1)
Sodium: 136 mEq/L (ref 135–145)

## 2020-03-15 LAB — TSH: TSH: 2.39 u[IU]/mL (ref 0.35–4.50)

## 2020-03-15 LAB — HEPATIC FUNCTION PANEL
ALT: 18 U/L (ref 0–53)
AST: 16 U/L (ref 0–37)
Albumin: 4.2 g/dL (ref 3.5–5.2)
Alkaline Phosphatase: 52 U/L (ref 39–117)
Bilirubin, Direct: 0.2 mg/dL (ref 0.0–0.3)
Total Bilirubin: 0.9 mg/dL (ref 0.2–1.2)
Total Protein: 6.3 g/dL (ref 6.0–8.3)

## 2020-03-15 LAB — HEMOGLOBIN A1C: Hgb A1c MFr Bld: 6.3 % (ref 4.6–6.5)

## 2020-03-17 ENCOUNTER — Telehealth (INDEPENDENT_AMBULATORY_CARE_PROVIDER_SITE_OTHER): Payer: PPO | Admitting: Internal Medicine

## 2020-03-17 DIAGNOSIS — E785 Hyperlipidemia, unspecified: Secondary | ICD-10-CM | POA: Diagnosis not present

## 2020-03-17 DIAGNOSIS — R918 Other nonspecific abnormal finding of lung field: Secondary | ICD-10-CM

## 2020-03-17 DIAGNOSIS — N289 Disorder of kidney and ureter, unspecified: Secondary | ICD-10-CM | POA: Diagnosis not present

## 2020-03-17 DIAGNOSIS — R252 Cramp and spasm: Secondary | ICD-10-CM | POA: Diagnosis not present

## 2020-03-17 DIAGNOSIS — I10 Essential (primary) hypertension: Secondary | ICD-10-CM | POA: Diagnosis not present

## 2020-03-17 DIAGNOSIS — I2510 Atherosclerotic heart disease of native coronary artery without angina pectoris: Secondary | ICD-10-CM | POA: Diagnosis not present

## 2020-03-17 DIAGNOSIS — R739 Hyperglycemia, unspecified: Secondary | ICD-10-CM | POA: Diagnosis not present

## 2020-03-17 MED ORDER — OMEPRAZOLE 20 MG PO CPDR: 20.0000 mg | DELAYED_RELEASE_CAPSULE | Freq: Every day | ORAL | 2 refills | Status: DC

## 2020-03-17 MED ORDER — SIMVASTATIN 40 MG PO TABS: 40.0000 mg | ORAL_TABLET | Freq: Every day | ORAL | 2 refills | Status: DC

## 2020-03-17 MED ORDER — AMLODIPINE BESYLATE 5 MG PO TABS: 5.0000 mg | ORAL_TABLET | Freq: Every day | ORAL | 2 refills | Status: DC

## 2020-03-17 NOTE — Progress Notes (Signed)
Patient ID: EWARD Peck, male   DOB: [birth year], 79 y.o.   MRN: [id number]   Virtual Visit via telephone Note  This visit type was conducted due to national recommendations for restrictions regarding the COVID-19 pandemic (e.g. social distancing).  This format is felt to be most appropriate for this patient at this time.  All issues noted in this document were discussed and addressed.  No physical exam was performed (except for noted visual exam findings with Video Visits).   I connected with James Peck by telephone and verified that I am speaking with the correct person using two identifiers. Location patient: home Location provider: work Persons participating in the virtual visit: patient, provider  The limitations, risks, security and privacy concerns of performing an evaluation and management service by telephone and the availability of in person appointments have been discussed.  It has also been discussed with the patient that there may be a patient responsible charge related to this service. The patient expressed understanding and agreed to proceed.   Reason for visit: Acute care follow up.   HPI: States he turned on his car and got out to get chairs.  Space was enclosed - car running.  Noticed sob with activity.  Went to ER initially and then to acute care.  CXR - no acute abnormality.  Has a history of COPD.  Used albuterol inhaler.  Was given 6 day prednisone taper.  Breathing normal now.  covid negative.  Back to normal.  Feels good.  No chest pain or sob now.  No acid reflux reported.  No abdominal pain.  Bowels moving.  Taking slow mag - helping with cramps.  Cramps not an issue now.  Saw James Peck recently for evaluation of hematuria.  CT - no acute findings to explain the hematuria.  Right renal lesion noted as outlined.  Also pulmonary changes as outlined.  Is s/p cystoscopy and per note, gross hematuria felt to be most likely prostatic in origin.  Urine cytology showed no abnormal  cells.  States 02/22/20, he noticed blood after urination.  No pain.  Has not noticed any bleeding since.  Was questioning f/u with James Peck.     ROS: See pertinent positives and negatives per HPI.  Past Medical History:  Diagnosis Date  . Allergy   . Arthritis   . Coronary artery disease   . GERD (gastroesophageal reflux disease)   . History of hiatal hernia   . Hypercholesteremia   . Hypertension     Past Surgical History:  Procedure Laterality Date  . CARDIAC CATHETERIZATION    . cardiac stents    . CATARACT EXTRACTION W/PHACO Right 09/10/2017   Procedure: CATARACT EXTRACTION PHACO AND INTRAOCULAR LENS PLACEMENT (IOC);  Surgeon: Birder Robson, MD;  Location: [facility] ORS;  Service: Ophthalmology;  Laterality: Right;  [place] 01:01.7 AP% 15.3 CDE 9.38 Fluid pack lot # [id number]  . CATARACT EXTRACTION W/PHACO Left 10/02/2017   Procedure: CATARACT EXTRACTION PHACO AND INTRAOCULAR LENS PLACEMENT (IOC);  Surgeon: Birder Robson, MD;  Location: [facility] ORS;  Service: Ophthalmology;  Laterality: Left;  [place] 00:42.3 AP% 16.2 CDE 6.85 Fluid Pack Lot # [id number]  . COLONOSCOPY WITH PROPOFOL N/A 06/28/2017   Procedure: COLONOSCOPY WITH PROPOFOL;  Surgeon: Manya Silvas, MD;  Location: [facility] ENDOSCOPY;  Service: Endoscopy;  Laterality: N/A;  . CORONARY ANGIOPLASTY     STENTS X 2  . KNEE ARTHROSCOPY      Family History  Problem Relation Age of Onset  . Heart  disease Mother   . Diabetes Mother   . Heart disease Father   . Diabetes Father     SOCIAL HX: reviewed.    Current Outpatient Medications:  .  amLODipine (NORVASC) 5 MG tablet, Take 1 tablet (5 mg total) by mouth daily., Disp: 90 tablet, Rfl: 2 .  aspirin EC 81 MG tablet, Take 81 mg by mouth daily. , Disp: , Rfl:  .  calcium carbonate (OS-CAL - DOSED IN MG OF ELEMENTAL CALCIUM) 1250 (500 CA) MG tablet, Take 1 tablet by mouth daily. , Disp: , Rfl:  .  cetirizine (ZYRTEC) 10 MG tablet, TAKE 1 TABLET BY MOUTH DAILY, Disp: 90  tablet, Rfl: 1 .  CINNAMON PO, Take 1,200 mg by mouth 2 times daily at 12 noon and 4 pm., Disp: , Rfl:  .  diclofenac sodium (VOLTAREN) 1 % GEL, Apply 2 g 2 (two) times daily topically. To affected joint., Disp: 300 g, Rfl: 11 .  fluticasone (FLONASE) 50 MCG/ACT nasal spray, TAKE 2 SPRAYS INTO BOTH NOSTRILS DAILY, Disp: 48 g, Rfl: 3 .  GLUCOSAMINE-CHONDROITIN PO, Take 1 capsule by mouth daily., Disp: , Rfl:  .  Ipratropium-Albuterol (COMBIVENT RESPIMAT) 20-100 MCG/ACT AERS respimat, Inhale 1 puff into the lungs every 6 (six) hours as needed for wheezing or shortness of breath (Cough)., Disp: 4 g, Rfl: 6 .  Misc Natural Products (BLACK CHERRY CONCENTRATE PO), Take 1,000 mg daily by mouth., Disp: , Rfl:  .  Omega-3 Fatty Acids (FISH OIL) 1000 MG CAPS, Take 1,000 mg by mouth daily. , Disp: , Rfl:  .  omeprazole (PRILOSEC) 20 MG capsule, Take 1 capsule (20 mg total) by mouth daily., Disp: 90 capsule, Rfl: 2 .  sildenafil (REVATIO) 20 MG tablet, TAKE 1 TO 2 TABLETS BY MOUTH EVERY DAY AS NEEDED, Disp: 60 tablet, Rfl: 0 .  simvastatin (ZOCOR) 40 MG tablet, Take 1 tablet (40 mg total) by mouth daily., Disp: 90 tablet, Rfl: 2 .  telmisartan (MICARDIS) 20 MG tablet, Take 1 tablet (20 mg total) by mouth daily., Disp: 90 tablet, Rfl: 1  EXAM:  VITALS per patient if applicable: 294/76, 68  GENERAL: alert.  Sounds to be in no acute distress.  Answering questions appropriately.   PSYCH/NEURO: pleasant and cooperative, no obvious depression or anxiety, speech and thought processing grossly intact  ASSESSMENT AND PLAN:  Discussed the following assessment and plan:  Leg cramps Taking slow mag.  Cramps improved.  Not a significant issue for him now.  Follow.    Hyperglycemia Low carb diet and exercise.  Follow met b and a1c.   HLD (hyperlipidemia) On simvastatin.  Low cholesterol diet and exercise.  Follow lipid panel and liver function tests.    CAD in native artery Followed by James Ubaldo Glassing.  No chest  pain.  Continue risk factor modification.   Benign essential HTN On micardis and amlodipine.  Blood pressure doing well as outlined.  Follow.    Lung nodules Abnormal changes noted on CT abdomen recently.  Discussed with pulmonary.  Recommended cxr to confirm nothing acute going on.  cxr recently with no acute abnormality.  Has f/u with pulmonary 04/12/20 - for further evaluation and f/u scanning.    Renal lesion Noted on recent abdominal CT.  Seeing urology.  D/w urology regarding further w/up.     Orders Placed This Encounter  Procedures  . Hemoglobin A1c    Standing Status:   Future    Standing Expiration Date:   03/27/2021  .  Hepatic function panel    Standing Status:   Future    Standing Expiration Date:   03/27/2021  . Lipid panel    Standing Status:   Future    Standing Expiration Date:   03/27/2021  . Basic metabolic panel    Standing Status:   Future    Standing Expiration Date:   03/27/2021    Meds ordered this encounter  Medications  . amLODipine (NORVASC) 5 MG tablet    Sig: Take 1 tablet (5 mg total) by mouth daily.    Dispense:  90 tablet    Refill:  2    FOR NEXT FILL. THNAKYOU  . omeprazole (PRILOSEC) 20 MG capsule    Sig: Take 1 capsule (20 mg total) by mouth daily.    Dispense:  90 capsule    Refill:  2  . simvastatin (ZOCOR) 40 MG tablet    Sig: Take 1 tablet (40 mg total) by mouth daily.    Dispense:  90 tablet    Refill:  2     I discussed the assessment and treatment plan with the patient. The patient was provided an opportunity to ask questions and all were answered. The patient agreed with the plan and demonstrated an understanding of the instructions.   The patient was advised to call back or seek an in-person evaluation if the symptoms worsen or if the condition fails to improve as anticipated.  I provided 25 minutes of non-face-to-face time during this encounter.   Einar Pheasant, MD

## 2020-03-27 ENCOUNTER — Encounter: Payer: Self-pay | Admitting: Internal Medicine

## 2020-03-27 DIAGNOSIS — R918 Other nonspecific abnormal finding of lung field: Secondary | ICD-10-CM | POA: Insufficient documentation

## 2020-03-27 DIAGNOSIS — N289 Disorder of kidney and ureter, unspecified: Secondary | ICD-10-CM | POA: Insufficient documentation

## 2020-03-27 NOTE — Assessment & Plan Note (Signed)
On simvastatin.  Low cholesterol diet and exercise.  Follow lipid panel and liver function tests.   

## 2020-03-27 NOTE — Assessment & Plan Note (Signed)
Followed by Dr Ubaldo Glassing.  No chest pain.  Continue risk factor modification.

## 2020-03-27 NOTE — Assessment & Plan Note (Signed)
Taking slow mag.  Cramps improved.  Not a significant issue for him now.  Follow.

## 2020-03-27 NOTE — Assessment & Plan Note (Signed)
Noted on recent abdominal CT.  Seeing urology.  D/w urology regarding further w/up.

## 2020-03-27 NOTE — Assessment & Plan Note (Signed)
On micardis and amlodipine.  Blood pressure doing well as outlined.  Follow.

## 2020-03-27 NOTE — Assessment & Plan Note (Signed)
Abnormal changes noted on CT abdomen recently.  Discussed with pulmonary.  Recommended cxr to confirm nothing acute going on.  cxr recently with no acute abnormality.  Has f/u with pulmonary 04/12/20 - for further evaluation and f/u scanning.

## 2020-03-27 NOTE — Assessment & Plan Note (Signed)
Low carb diet and exercise.  Follow met b and a1c.  

## 2020-03-30 ENCOUNTER — Telehealth: Payer: Self-pay | Admitting: Internal Medicine

## 2020-03-30 NOTE — Telephone Encounter (Signed)
Patient is aware 

## 2020-03-30 NOTE — Telephone Encounter (Signed)
Please notify pt that I contacted Dr Bernardo Heater and he did not feel James Peck needed an earlier appt or any further w/up at this time for the hematuria.  He does want him to keep his January appt.  Let us know if any problems.

## 2020-03-30 NOTE — Telephone Encounter (Signed)
-----   Message from Abbie Sons, MD sent at 03/29/2020  5:16 PM EDT ----- Regarding: RE: follow up question Follow-up imaging of a nonenhancing proteinaceous cyst is no longer recommended.  I suspect his hematuria is most likely from BPH.  I think January follow-up is fine though am happy to see him earlier if needed for reassurance.  Thanks, Event organiser ----- Message ----- From: Einar Pheasant, MD Sent: 03/27/2020  12:08 PM EDT To: Abbie Sons, MD Subject: follow up question                             You saw this pt recently for w/up of hematuria.  Cystoscopy unrevealing.  Urine cytology - negative. CT scan unrevealing for etiology of his hematuria.  They did mention lung changes, which we are following.  They also mentioned a renal lesion and recommended MRI abdomen.  He has a f/u with you in 07/2020.  I saw him in follow up recently and he reported another episode of hematuria - 02/22/20.  Has not noticed any bleeding since.  He was questioning need for f/u with you earlier than 07/2020.  Also, do I need to f/u regarding the renal lesion.  Just wanted to get your thoughts.  I can schedule him for an earlier f/u if needed.  Thank you for your help with Mr Kurka.  I really appreciate it.   Thank you. Taylen Wendland

## 2020-04-06 ENCOUNTER — Other Ambulatory Visit: Payer: Self-pay | Admitting: Internal Medicine

## 2020-04-12 ENCOUNTER — Other Ambulatory Visit: Payer: Self-pay

## 2020-04-12 ENCOUNTER — Encounter: Payer: Self-pay | Admitting: Pulmonary Disease

## 2020-04-12 ENCOUNTER — Ambulatory Visit: Payer: PPO | Admitting: Pulmonary Disease

## 2020-04-12 VITALS — BP 128/70 | HR 71 | Temp 97.8°F | Ht 64.0 in | Wt 161.6 lb

## 2020-04-12 DIAGNOSIS — J309 Allergic rhinitis, unspecified: Secondary | ICD-10-CM

## 2020-04-12 DIAGNOSIS — Z87891 Personal history of nicotine dependence: Secondary | ICD-10-CM

## 2020-04-12 DIAGNOSIS — J439 Emphysema, unspecified: Secondary | ICD-10-CM

## 2020-04-12 DIAGNOSIS — R05 Cough: Secondary | ICD-10-CM

## 2020-04-12 DIAGNOSIS — R059 Cough, unspecified: Secondary | ICD-10-CM

## 2020-04-12 DIAGNOSIS — Z0000 Encounter for general adult medical examination without abnormal findings: Secondary | ICD-10-CM | POA: Diagnosis not present

## 2020-04-12 MED ORDER — BENZONATATE 200 MG PO CAPS: 200.0000 mg | ORAL_CAPSULE | Freq: Three times a day (TID) | ORAL | 1 refills | Status: DC | PRN

## 2020-04-12 MED ORDER — STIOLTO RESPIMAT 2.5-2.5 MCG/ACT IN AERS
2.0000 | INHALATION_SPRAY | Freq: Every day | RESPIRATORY_TRACT | 0 refills | Status: DC
Start: 2020-04-12 — End: 2020-05-10

## 2020-04-12 NOTE — Assessment & Plan Note (Signed)
Plan: Obtain seasonal flu vaccine in fall/2021

## 2020-04-12 NOTE — Assessment & Plan Note (Signed)
Former smoker 40-pack-year smoking history  Plan: Pulmonary function testing ordered

## 2020-04-12 NOTE — Assessment & Plan Note (Signed)
Hyperinflation seen on chest x-ray imaging 40-pack-year smoker No formal pulmonary function testing Ongoing dyspnea as well as worsened cough  Plan: Trial of Stiolto Respimat today Samples provided Pulmonary function testing ordered

## 2020-04-12 NOTE — Assessment & Plan Note (Signed)
Plan: Continue Zyrtec Continue Flonase Start nasal saline rinses Continue PPI Start Tessalon Perles Trial of Stiolto Respimat Okay to continue over-the-counter Delsym for management of cough We will order pulmonary function testing

## 2020-04-12 NOTE — Progress Notes (Signed)
@Patient  ID: James Peck, male    DOB: [birth year], 79 y.o.   MRN: [id number]  Chief Complaint  Patient presents with   Follow-up    Patient has been coughing more with exertion, cough is dry and has been wheezing more worse at night.    Referring provider: Einar Pheasant, MD  HPI:  78 year old male former smoker followed in our office for cough  PMH: Hyperlipidemia, CAD, GERD Smoker/ Smoking History: Former smoker.  Quit 2000.  40-pack-year smoking history Maintenance:   Pt of: Dr. Patsey Berthold  04/12/2020  - Visit   79 year old male former smoker followed in our office for cough.  Patient was last seen by Dr. Patsey Berthold in June/2021 patient was encouraged to have as needed follow-up and to notify our office if cough worsens.  Patient reporting to our office today reporting that his cough has continued to worsen especially over the last 2 to 3 weeks.  He also has nasal congestion postnasal drip.  See cough ROS listed below:  04/12/20 - Cough ROS:   When to the symptoms start: years ago  How are you today: best day in 3-4 days   Have you had fever/sore throat (first 5 to 7 days of URI) or Have you had cough/nasal congestion (10 to 14 days of URI) : Cough, nasal congestion Have you used anything to treat the cough, as anything improved : Over-the-counter cough medicine Is it a dry or wet cough: dry cough  Does the cough happen when your breathing or when you breathe out: unsure  Other any triggers to your cough, or any aggravating factors: cough is worse in morning, and when lying, and at night   Daily antihistamine: yes, zyrtec  GERD treatment: yes Singulair: none  Cough checklist (bolded indicates presence):  Adherence, acid reflux, ACE inhibitor -stopped 3 mo ago , active sinus disease, active smoking, former smoker, adverse effects of medications (amiodarone/Macrodantin/bb), alpha 1, allergies, aspiration, anxiety, bronchiectasis, congestive heart failure  (diastolic)   Patient reports that he is never been maintained on a maintenance inhaler before.  He does like the Combivent that he is currently taking.  He reports that he has been given a diagnosis of COPD but has never had pulmonary function testing.  This been based off of chest x-rays.   Tests:   07/03/2019-chest x-ray-COPD, no active cardiopulmonary disease  FENO:  No results found for: NITRICOXIDE  PFT: No flowsheet data found.  WALK:  No flowsheet data found.  Imaging: No results found.  Lab Results:  CBC    Component Value Date/Time   WBC 7.8 03/06/2020 1005   RBC 4.85 03/06/2020 1005   HGB 15.2 03/06/2020 1005   HCT 44.8 03/06/2020 1005   PLT 283 03/06/2020 1005   MCV 92.4 03/06/2020 1005   MCH 31.3 03/06/2020 1005   MCHC 33.9 03/06/2020 1005   RDW 13.1 03/06/2020 1005   LYMPHSABS 1.8 03/06/2020 1005   MONOABS 0.9 03/06/2020 1005   EOSABS 0.4 03/06/2020 1005   BASOSABS 0.0 03/06/2020 1005    BMET    Component Value Date/Time   NA 136 03/15/2020 0803   NA 137 11/06/2012 0034   K 4.4 03/15/2020 0803   K 3.8 11/06/2012 0034   CL 99 03/15/2020 0803   CL 104 11/06/2012 0034   CO2 30 03/15/2020 0803   CO2 26 11/06/2012 0034   GLUCOSE 104 (H) 03/15/2020 0803   GLUCOSE 127 (H) 11/06/2012 0034   BUN 13 03/15/2020 0803   BUN  12 11/06/2012 0034   CREATININE 0.97 03/15/2020 0803   CREATININE 0.96 11/06/2012 0034   CALCIUM 9.6 03/15/2020 0803   CALCIUM 8.3 (L) 11/06/2012 0034   GFRNONAA >60 03/06/2020 1005   GFRNONAA >60 11/06/2012 0034   GFRAA >60 03/06/2020 1005   GFRAA >60 11/06/2012 0034    BNP No results found for: BNP  ProBNP No results found for: PROBNP  Specialty Problems      Pulmonary Problems   Cough   Lung nodules   Allergic rhinitis   Emphysema, unspecified (HCC)      Allergies  Allergen Reactions   Ace Inhibitors     Cough    Immunization History  Administered Date(s) Administered   Fluad Quad(high Dose 65+)  04/09/2019   Influenza Split 05/11/2014   Influenza, High Dose Seasonal PF 04/23/2017, 04/29/2018, 04/09/2019   Influenza-Unspecified 04/25/2015, 04/23/2017   PFIZER SARS-COV-2 Vaccination 08/28/2019, 09/18/2019   Tdap 03/11/2013    Past Medical History:  Diagnosis Date   Allergy    Arthritis    Coronary artery disease    GERD (gastroesophageal reflux disease)    History of hiatal hernia    Hypercholesteremia    Hypertension     Tobacco History: Social History   Tobacco Use  Smoking Status Former Smoker   Packs/day: 1.00   Years: 40.00   Pack years: 40.00   Types: Cigarettes   Quit date: 2000   Years since quitting: 21.7  Smokeless Tobacco Never Used   Counseling given: Yes   Continue to not smoke  Outpatient Encounter Medications as of 04/12/2020  Medication Sig   albuterol (VENTOLIN HFA) 108 (90 Base) MCG/ACT inhaler Inhale 1-2 puffs into the lungs every 6 (six) hours as needed for wheezing or shortness of breath.   amLODipine (NORVASC) 5 MG tablet Take 1 tablet (5 mg total) by mouth daily.   aspirin EC 81 MG tablet Take 81 mg by mouth daily.    calcium carbonate (OS-CAL - DOSED IN MG OF ELEMENTAL CALCIUM) 1250 (500 CA) MG tablet Take 1 tablet by mouth daily.    cetirizine (ZYRTEC) 10 MG tablet TAKE 1 TABLET BY MOUTH DAILY   CINNAMON PO Take 1,200 mg by mouth 2 times daily at 12 noon and 4 pm.   diclofenac sodium (VOLTAREN) 1 % GEL Apply 2 g 2 (two) times daily topically. To affected joint.   fluticasone (FLONASE) 50 MCG/ACT nasal spray TAKE 2 SPRAYS INTO BOTH NOSTRILS DAILY   GLUCOSAMINE-CHONDROITIN PO Take 1 capsule by mouth daily.   Ipratropium-Albuterol (COMBIVENT RESPIMAT) 20-100 MCG/ACT AERS respimat Inhale 1 puff into the lungs every 6 (six) hours as needed for wheezing or shortness of breath (Cough).   Misc Natural Products (BLACK CHERRY CONCENTRATE PO) Take 1,000 mg daily by mouth.   Omega-3 Fatty Acids (FISH OIL) 1000 MG CAPS  Take 1,000 mg by mouth daily.    omeprazole (PRILOSEC) 20 MG capsule Take 1 capsule (20 mg total) by mouth daily.   sildenafil (REVATIO) 20 MG tablet TAKE 1 TO 2 TABLETS BY MOUTH EVERY DAY AS NEEDED   simvastatin (ZOCOR) 40 MG tablet Take 1 tablet (40 mg total) by mouth daily.   telmisartan (MICARDIS) 20 MG tablet TAKE ONE TABLET EVERY DAY   benzonatate (TESSALON) 200 MG capsule Take 1 capsule (200 mg total) by mouth 3 (three) times daily as needed for cough.   Tiotropium Bromide-Olodaterol (STIOLTO RESPIMAT) 2.5-2.5 MCG/ACT AERS Inhale 2 puffs into the lungs daily.   No facility-administered encounter medications  on file as of 04/12/2020.     Review of Systems  Review of Systems  Constitutional: Negative for activity change, chills, fatigue, fever and unexpected weight change.  HENT: Positive for congestion, postnasal drip and rhinorrhea. Negative for sinus pressure, sinus pain and sore throat.   Eyes: Negative.   Respiratory: Positive for cough. Negative for shortness of breath and wheezing.   Cardiovascular: Negative for chest pain and palpitations.  Gastrointestinal: Negative for constipation, diarrhea, nausea and vomiting.  Endocrine: Negative.   Genitourinary: Negative.   Musculoskeletal: Negative.   Skin: Negative.   Neurological: Negative for dizziness and headaches.  Psychiatric/Behavioral: Negative.  Negative for dysphoric mood. The patient is not nervous/anxious.   All other systems reviewed and are negative.    Physical Exam  BP 128/70 (BP Location: Left Arm, Patient Position: Sitting, Cuff Size: Normal)    Pulse 71    Temp 97.8 F (36.6 C) (Temporal)    Ht 5\' 4"  (1.626 m)    Wt 161 lb 9.6 oz (73.3 kg)    SpO2 94%    BMI 27.74 kg/m   Wt Readings from Last 5 Encounters:  04/12/20 161 lb 9.6 oz (73.3 kg)  03/17/20 155 lb (70.3 kg)  02/10/20 156 lb 9.6 oz (71 kg)  12/31/19 158 lb (71.7 kg)  12/29/19 157 lb 12.8 oz (71.6 kg)    BMI Readings from Last 5  Encounters:  04/12/20 27.74 kg/m  03/17/20 26.61 kg/m  02/10/20 26.88 kg/m  12/31/19 27.12 kg/m  12/29/19 26.61 kg/m     Physical Exam Vitals and nursing note reviewed.  Constitutional:      General: He is not in acute distress.    Appearance: Normal appearance. He is normal weight.  HENT:     Head: Normocephalic and atraumatic.     Right Ear: Hearing, tympanic membrane, ear canal and external ear normal. There is no impacted cerumen.     Left Ear: Hearing, tympanic membrane, ear canal and external ear normal. There is impacted cerumen (50% occluded).     Nose: Congestion and rhinorrhea present. No mucosal edema.     Right Turbinates: Not enlarged.     Left Turbinates: Not enlarged.     Mouth/Throat:     Mouth: Mucous membranes are dry.     Pharynx: Oropharynx is clear. No oropharyngeal exudate.     Comments: Postnasal drip Eyes:     Pupils: Pupils are equal, round, and reactive to light.  Cardiovascular:     Rate and Rhythm: Normal rate and regular rhythm.     Pulses: Normal pulses.     Heart sounds: Normal heart sounds. No murmur heard.   Pulmonary:     Effort: Pulmonary effort is normal.     Breath sounds: Normal breath sounds. No decreased breath sounds, wheezing or rales.  Musculoskeletal:     Cervical back: Normal range of motion.     Right lower leg: No edema.     Left lower leg: No edema.  Lymphadenopathy:     Cervical: No cervical adenopathy.  Skin:    General: Skin is warm and dry.     Capillary Refill: Capillary refill takes less than 2 seconds.     Findings: No erythema or rash.  Neurological:     General: No focal deficit present.     Mental Status: He is alert and oriented to person, place, and time.     Motor: No weakness.     Coordination: Coordination normal.  Gait: Gait is intact. Gait normal.  Psychiatric:        Mood and Affect: Mood normal.        Behavior: Behavior normal. Behavior is cooperative.        Thought Content: Thought  content normal.        Judgment: Judgment normal.       Assessment & Plan:   Emphysema, unspecified (HCC) Hyperinflation seen on chest x-ray imaging 40-pack-year smoker No formal pulmonary function testing Ongoing dyspnea as well as worsened cough  Plan: Trial of Stiolto Respimat today Samples provided Pulmonary function testing ordered   Allergic rhinitis Postnasal drip on exam today Rhinorrhea on exam today  Plan: Continue Zyrtec Start nasal saline rinses twice daily Continue Flonase 1 spray each nostril after nasal saline rinses daily  Healthcare maintenance Plan: Obtain seasonal flu vaccine in fall/2021  Former smoker Former smoker 40-pack-year smoking history  Plan: Pulmonary function testing ordered  Cough Plan: Continue Zyrtec Continue Flonase Start nasal saline rinses Continue PPI Start Tessalon Perles Trial of Stiolto Respimat Okay to continue over-the-counter Delsym for management of cough We will order pulmonary function testing    Return in about 2 months (around 06/12/2020), or if symptoms worsen or fail to improve, for Follow up with Wyn Quaker FNP-C, [facility] - Dr. Patsey Berthold, Follow up for FULL PFT - 60 min.   Lauraine Rinne, NP 04/12/2020   This appointment required 32 minutes of patient care (this includes precharting, chart review, review of results, face-to-face care, etc.).

## 2020-04-12 NOTE — Patient Instructions (Addendum)
You were seen today by Lauraine Rinne, NP  for:   1. Cough  Cough Home Instructions:  We believe you have a chronic/cyclical cough that is aggravated by reflux , coughing , and drainage.  . Goal is to not Cough or clear throat.  Marland Kitchen Avoid coughing or clearing throat by using:  o non-mint products/sugarless candy o Water o ice chips o Remember NO MINT PRODUCTS  . Medications to use:  o Mucinex DM 1-2 every 12 hrs or Delsym 2 tsp every 12 hrs for cough (These are Over the counter) o Tessalon Three times a day  As needed  Cough.  o Prilosec 20mg  30 min before breakfast or dinner.  o Zyrtec 10mg  at bedtime (Can use generic, this is over the counter) o Chlor tabs 4mg  2 at bedtime  for nasal drip until cough is 100% cough free. (this medication is over the counter)     2. Pulmonary emphysema, unspecified emphysema type ([place])  - Pulmonary function test; Future  Trial of Stiolto Respimat inhaler >>>2 puffs daily >>>Take this no matter what >>>This is not a rescue inhaler  Contact our office and let us know how you are doing after you finish the first sample  Your insurance may not completely cover this inhaler.  If you are tolerating this inhaler we can always trial you on a similar medication from its class that may be covered better by your insurance.  Note your daily symptoms > remember "red flags" for COPD:   >>>Increase in cough >>>increase in sputum production >>>increase in shortness of breath or activity  intolerance.   If you notice these symptoms, please call the office to be seen.    3. Healthcare maintenance  We recommend the seasonal flu vaccine available in fall/2020  4. Former smoker  - Pulmonary function test; Future  5. Allergic rhinitis, unspecified seasonality, unspecified trigger  Continue Zyrtec  Start nasal saline rinses twice daily Use distilled water Shake well Get bottle lukewarm like a baby bottle  Continue to utilize Flonase 1 spray each  nostril daily after nasal saline rinses  We recommend today:  Orders Placed This Encounter  Procedures  . Pulmonary function test    Standing Status:   Future    Standing Expiration Date:   04/12/2021    Order Specific Question:   Where should this test be performed?    Answer:   [facility]    Order Specific Question:   Full PFT: includes the following: basic spirometry, spirometry pre & post bronchodilator, diffusion capacity (DLCO), lung volumes    Answer:   Full PFT   Orders Placed This Encounter  Procedures  . Pulmonary function test   Meds ordered this encounter  Medications  . Tiotropium Bromide-Olodaterol (STIOLTO RESPIMAT) 2.5-2.5 MCG/ACT AERS    Sig: Inhale 2 puffs into the lungs daily.    Dispense:  4 g    Refill:  0    Order Specific Question:   Lot Number?    Answer:   [id number]    Order Specific Question:   Expiration Date?    Answer:   02/27/2022    Order Specific Question:   Quantity    Answer:   2  . benzonatate (TESSALON) 200 MG capsule    Sig: Take 1 capsule (200 mg total) by mouth 3 (three) times daily as needed for cough.    Dispense:  30 capsule    Refill:  1    Follow Up:  Return in about 2 months (around 06/12/2020), or if symptoms worsen or fail to improve, for Follow up with Wyn Quaker FNP-C, [facility] - Dr. Patsey Berthold, Follow up for FULL PFT - 60 min.  Patient agreeable to go to [facility] office for pulmonary function testing if needed   Notification of test results are managed in the following manner: If there are  any recommendations or changes to the  plan of care discussed in office today,  we will contact you and let you know what they are. If you do not hear from [place], then your results are normal and you can view them through your  MyChart account , or a letter will be sent to you. Thank you again for trusting [place] with your care  - Thank you, [facility]    It is flu season:   >>> Best ways to protect herself from the flu:  Receive the yearly flu vaccine, practice good hand hygiene washing with soap and also using hand sanitizer when available, eat a nutritious meals, get adequate rest, hydrate appropriately       Please contact the office if your symptoms worsen or you have concerns that you are not improving.   Thank you for choosing [facility] for your healthcare, and for allowing [place] to partner with you on your healthcare journey. I am thankful to be able to provide care to you today.   Wyn Quaker FNP-C

## 2020-04-12 NOTE — Assessment & Plan Note (Signed)
Postnasal drip on exam today Rhinorrhea on exam today  Plan: Continue Zyrtec Start nasal saline rinses twice daily Continue Flonase 1 spray each nostril after nasal saline rinses daily

## 2020-04-15 NOTE — Progress Notes (Signed)
Agree with the details of the visit as noted below by Wyn Quaker, NP.  Renold Don, MD [facility]

## 2020-04-18 ENCOUNTER — Other Ambulatory Visit: Payer: Self-pay

## 2020-04-18 ENCOUNTER — Ambulatory Visit (INDEPENDENT_AMBULATORY_CARE_PROVIDER_SITE_OTHER): Payer: PPO | Admitting: Internal Medicine

## 2020-04-18 DIAGNOSIS — Z87891 Personal history of nicotine dependence: Secondary | ICD-10-CM

## 2020-04-18 DIAGNOSIS — J439 Emphysema, unspecified: Secondary | ICD-10-CM

## 2020-04-18 LAB — PULMONARY FUNCTION TEST
DL/VA % pred: 73 %
DL/VA: 2.93 ml/min/mmHg/L
DLCO cor % pred: 72 %
DLCO cor: 15.79 ml/min/mmHg
DLCO unc % pred: 72 %
DLCO unc: 15.79 ml/min/mmHg
FEF 25-75 Post: 1.03 L/sec
FEF 25-75 Pre: 0.65 L/sec
FEF2575-%Change-Post: 58 %
FEF2575-%Pred-Post: 60 %
FEF2575-%Pred-Pre: 37 %
FEV1-%Change-Post: 17 %
FEV1-%Pred-Post: 67 %
FEV1-%Pred-Pre: 57 %
FEV1-Post: 1.65 L
FEV1-Pre: 1.4 L
FEV1FVC-%Change-Post: 4 %
FEV1FVC-%Pred-Pre: 78 %
FEV6-%Change-Post: 11 %
FEV6-%Pred-Post: 85 %
FEV6-%Pred-Pre: 76 %
FEV6-Post: 2.75 L
FEV6-Pre: 2.47 L
FEV6FVC-%Change-Post: 0 %
FEV6FVC-%Pred-Post: 106 %
FEV6FVC-%Pred-Pre: 107 %
FVC-%Change-Post: 12 %
FVC-%Pred-Post: 80 %
FVC-%Pred-Pre: 71 %
FVC-Post: 2.78 L
FVC-Pre: 2.48 L
Post FEV1/FVC ratio: 59 %
Post FEV6/FVC ratio: 99 %
Pre FEV1/FVC ratio: 57 %
Pre FEV6/FVC Ratio: 100 %
RV % pred: 171 %
RV: 4.12 L
TLC % pred: 110 %
TLC: 6.92 L

## 2020-04-18 NOTE — Progress Notes (Signed)
Full PFT performed today. °

## 2020-04-20 NOTE — Progress Notes (Signed)
Since he has had clinical improvement.  We will continue to clinically monitor.  Keep patient on Stiolto Respimat at this time.  We can further review at next follow-up within our office.  Please make sure patient has return office visit schedule a recall in place.Patient is to contact her office if he has acute worsening changes with his breathing.Wyn Quaker, FNP

## 2020-04-25 ENCOUNTER — Other Ambulatory Visit: Payer: Self-pay | Admitting: Pulmonary Disease

## 2020-04-25 DIAGNOSIS — R059 Cough, unspecified: Secondary | ICD-10-CM

## 2020-05-02 ENCOUNTER — Other Ambulatory Visit: Payer: Self-pay | Admitting: Pulmonary Disease

## 2020-05-02 DIAGNOSIS — R059 Cough, unspecified: Secondary | ICD-10-CM

## 2020-05-02 NOTE — Telephone Encounter (Signed)
Pt is returning call - CB# [phone] - if no answer on 1st please call  [phone]

## 2020-05-02 NOTE — Telephone Encounter (Signed)
Lm for patient.  

## 2020-05-02 NOTE — Telephone Encounter (Signed)
Spoke to patient, who stated that stiolto is not covered by insurance. He is requesting to switch back to combivent.  I have recommended that he contact insurance and obtain list of covered alternatives. He is also requesting refill on tessalon.  Patient is okay with waiting until 05/03/2020 for a response, as he has enough of both stiolto and tessalon to last for one more week.  Brain please advise on combivent and tessalon. Thanks

## 2020-05-03 ENCOUNTER — Telehealth: Payer: Self-pay | Admitting: Pulmonary Disease

## 2020-05-03 MED ORDER — BENZONATATE 200 MG PO CAPS: 200.0000 mg | ORAL_CAPSULE | Freq: Three times a day (TID) | ORAL | 3 refills | Status: DC | PRN

## 2020-05-03 NOTE — Telephone Encounter (Signed)
Lm for patient.  

## 2020-05-03 NOTE — Telephone Encounter (Signed)
Lm for patient.   Please see 05/02/2020 phone note.

## 2020-05-03 NOTE — Telephone Encounter (Signed)
Agree with already stated recommendations that patient should contact insurance to see appropriate LAMA/LABA formulary recommendations.  I have also CCed our pharmacy team to see if they can look at comparative alternatives to the [facility] since his insurance does not cover it.  Combivent is not a comparable inhaler.  Okay to refill [employer] with 3 refills.  Wyn Quaker, FNP

## 2020-05-03 NOTE — Telephone Encounter (Signed)
Spoke to patient, who stated that he spoke with insurance and covered alternatives are Adviar, Spiriva or trelegy. Rx for Tessalon 200mg  has been sent to preferred pharmacy.    Aaron Edelman, please advise. Thanks

## 2020-05-03 NOTE — Telephone Encounter (Signed)
Will await follow-up from pharmacy team.  I suspect there are other inhalers that are covered.  If this is accurate information and likely patient will be transition to Spiriva Respimat 2.5.  We will await follow-up from pharmacy team from test claims.  Wyn Quaker, FNP

## 2020-05-05 DIAGNOSIS — D3131 Benign neoplasm of right choroid: Secondary | ICD-10-CM | POA: Diagnosis not present

## 2020-05-10 MED ORDER — ANORO ELLIPTA 62.5-25 MCG/INH IN AEPB: 1.0000 | INHALATION_SPRAY | Freq: Every day | RESPIRATORY_TRACT | 1 refills | Status: AC

## 2020-05-10 NOTE — Telephone Encounter (Signed)
Pharmacy team, do we have an update?

## 2020-05-10 NOTE — Telephone Encounter (Signed)
05/10/2020  Please contact the patient and let them know what his insurance plan prefers.  We can send in a prescription for Anoro Ellipta based off this information if the patient would like.  This would be a more suitable option to replace the Stiolto.  Anoro Ellipta  >>> Take 1 puff daily in the morning right when you wake up >>>Rinse your mouth out after use >>>This is a daily maintenance inhaler, NOT a rescue inhaler >>>Contact our office if you are having difficulties affording or obtaining this medication >>>It is important for you to be able to take this daily and not miss any doses    Refills: Olinda FNP

## 2020-05-10 NOTE — Telephone Encounter (Signed)
Patient's plan prefers Anoro, copay for 1 month supply is $45.00.

## 2020-05-10 NOTE — Telephone Encounter (Signed)
Spoke to patient and relayed below recommendations.  Patient is agreeable with switching to Anoro.  Patient requesting 90 day supply. Rx has been sent to preferred pharmacy.  Nothing further needed.

## 2020-06-13 ENCOUNTER — Other Ambulatory Visit: Payer: Self-pay | Admitting: Internal Medicine

## 2020-06-21 ENCOUNTER — Other Ambulatory Visit: Payer: Self-pay

## 2020-06-21 ENCOUNTER — Ambulatory Visit: Payer: PPO | Admitting: Pulmonary Disease

## 2020-06-21 ENCOUNTER — Encounter: Payer: Self-pay | Admitting: Pulmonary Disease

## 2020-06-21 VITALS — BP 130/74 | HR 75 | Temp 97.1°F | Ht 64.0 in | Wt 161.0 lb

## 2020-06-21 DIAGNOSIS — J309 Allergic rhinitis, unspecified: Secondary | ICD-10-CM | POA: Diagnosis not present

## 2020-06-21 DIAGNOSIS — Z0000 Encounter for general adult medical examination without abnormal findings: Secondary | ICD-10-CM

## 2020-06-21 DIAGNOSIS — Z87891 Personal history of nicotine dependence: Secondary | ICD-10-CM

## 2020-06-21 DIAGNOSIS — J439 Emphysema, unspecified: Secondary | ICD-10-CM | POA: Diagnosis not present

## 2020-06-21 MED ORDER — ANORO ELLIPTA 62.5-25 MCG/INH IN AEPB: 1.0000 | INHALATION_SPRAY | Freq: Every day | RESPIRATORY_TRACT | 3 refills | Status: DC

## 2020-06-21 NOTE — Patient Instructions (Addendum)
You were seen today by Lauraine Rinne, NP  for:   1. Pulmonary emphysema, unspecified emphysema type (H[facility])  Anoro Ellipta  >>> Take 1 puff daily in the morning right when you wake up >>>Rinse your mouth out after use >>>This is a daily maintenance inhaler, NOT a rescue inhaler >>>Contact our office if you are having difficulties affording or obtaining this medication >>>It is important for you to be able to take this daily and not miss any doses   Only use your albuterol as a rescue medication to be used if you can't catch your breath by resting or doing a relaxed purse lip breathing pattern.  - The less you use it, the better it will work when you need it. - Ok to use up to 2 puffs  every 4 hours if you must but call for immediate appointment if use goes up over your usual need - Don't leave home without it !!  (think of it like the spare tire for your car)   Okay to stop Combivent  Okay to stop Tessalon Perles  2. Allergic rhinitis, unspecified seasonality, unspecified trigger  Continue Zyrtec  Continue nasal saline rinses  3. Healthcare maintenance  Obtain your COVID-19 booster vaccination  4. Former smoker  Continue not smoke  Follow Up:    Return in about 6 months (around 12/19/2020), or if symptoms worsen or fail to improve, for [facility] - Dr. Patsey Berthold, Follow up with Wyn Quaker FNP-C.   Notification of test results are managed in the following manner: If there are  any recommendations or changes to the  plan of care discussed in office today,  we will contact you and let you know what they are. If you do not hear from [place], then your results are normal and you can view them through your  MyChart account , or a letter will be sent to you. Thank you again for trusting [place] with your care  - Thank you, [facility]    It is flu season:   >>> Best ways to protect herself from the flu: Receive the yearly flu vaccine, practice good hand hygiene washing with soap  and also using hand sanitizer when available, eat a nutritious meals, get adequate rest, hydrate appropriately       Please contact the office if your symptoms worsen or you have concerns that you are not improving.   Thank you for choosing [facility] for your healthcare, and for allowing [place] to partner with you on your healthcare journey. I am thankful to be able to provide care to you today.   Wyn Quaker FNP-C

## 2020-06-21 NOTE — Assessment & Plan Note (Signed)
Reviewed pulmonary function testing  Plan: Continue Anoro Ellipta Follow-up in 6 months

## 2020-06-21 NOTE — Progress Notes (Signed)
@Patient  ID: James Peck, male    DOB: 06-Nov-1940, 79 y.o.   MRN: [id number]  Chief Complaint  Patient presents with  . Follow-up    patient stated that breathing has improved since last ov. c/o sob with exertion.     Referring provider: Einar Pheasant, MD  HPI:  79 year old male former smoker followed in our office for cough  PMH: Hyperlipidemia, CAD, GERD Smoker/ Smoking History: Former smoker.  Quit 2000.  40-pack-year smoking history Maintenance: Anoro Ellipta Pt of: Dr. Patsey Berthold  06/21/2020  - Visit   79 year old male former smoker followed in our office for cough.  He has completed pulmonary function testing that does show COPD Gold stage II.  He was placed on a LAMA/LABA inhaler.  He has found benefit with inhaler showing improvement of cough as well as shortness of breath.  Patient reports he has not had to use his rescue inhaler at all since starting Anoro Ellipta.  He prefers this inhaler.  No acute respiratory complaints today.  Patient has received his first 2 COVID-19 vaccinations.  He is planning on obtaining the booster.  Questionaires / Pulmonary Flowsheets:   ACT:  No flowsheet data found.  MMRC: mMRC Dyspnea Scale mMRC Score  06/21/2020 1    Epworth:  No flowsheet data found.  Tests:   07/03/2019-chest x-ray-COPD, no active cardiopulmonary disease  04/18/2020-pulmonary function test-FVC 2.48 (71% addicted), postbronchodilator ratio 59, postbronchodilator FEV1 1.65 (67% predicted), positive bronchodilator response, mid flow reversibility, DLCO 15.79 (72% predicted)  FENO:  No results found for: NITRICOXIDE  PFT: PFT Results Latest Ref Rng & Units 04/18/2020  FVC-Pre L 2.48  FVC-Predicted Pre % 71  FVC-Post L 2.78  FVC-Predicted Post % 80  Pre FEV1/FVC % % 57  Post FEV1/FCV % % 59  FEV1-Pre L 1.40  FEV1-Predicted Pre % 57  FEV1-Post L 1.65  DLCO uncorrected ml/min/mmHg 15.79  DLCO UNC% % 72  DLCO corrected ml/min/mmHg 15.79  DLCO COR  %Predicted % 72  DLVA Predicted % 73  TLC L 6.92  TLC % Predicted % 110  RV % Predicted % 171    WALK:  No flowsheet data found.  Imaging: No results found.  Lab Results:  CBC    Component Value Date/Time   WBC 7.8 03/06/2020 1005   RBC 4.85 03/06/2020 1005   HGB 15.2 03/06/2020 1005   HCT 44.8 03/06/2020 1005   PLT 283 03/06/2020 1005   MCV 92.4 03/06/2020 1005   MCH 31.3 03/06/2020 1005   MCHC 33.9 03/06/2020 1005   RDW 13.1 03/06/2020 1005   LYMPHSABS 1.8 03/06/2020 1005   MONOABS 0.9 03/06/2020 1005   EOSABS 0.4 03/06/2020 1005   BASOSABS 0.0 03/06/2020 1005    BMET    Component Value Date/Time   NA 136 03/15/2020 0803   NA 137 11/06/2012 0034   K 4.4 03/15/2020 0803   K 3.8 11/06/2012 0034   CL 99 03/15/2020 0803   CL 104 11/06/2012 0034   CO2 30 03/15/2020 0803   CO2 26 11/06/2012 0034   GLUCOSE 104 (H) 03/15/2020 0803   GLUCOSE 127 (H) 11/06/2012 0034   BUN 13 03/15/2020 0803   BUN 12 11/06/2012 0034   CREATININE 0.97 03/15/2020 0803   CREATININE 0.96 11/06/2012 0034   CALCIUM 9.6 03/15/2020 0803   CALCIUM 8.3 (L) 11/06/2012 0034   GFRNONAA >60 03/06/2020 1005   GFRNONAA >60 11/06/2012 0034   GFRAA >60 03/06/2020 1005   GFRAA >60 11/06/2012 0034  BNP No results found for: BNP  ProBNP No results found for: PROBNP  Specialty Problems      Pulmonary Problems   Cough   Lung nodules   Allergic rhinitis   Emphysema, unspecified (HCC)      Allergies  Allergen Reactions  . Ace Inhibitors     Cough    Immunization History  Administered Date(s) Administered  . Fluad Quad(high Dose 65+) 04/09/2019  . Influenza Split 05/11/2014  . Influenza, High Dose Seasonal PF 04/23/2017, 04/29/2018, 04/09/2019, 05/05/2020  . Influenza-Unspecified 04/25/2015, 04/23/2017  . PFIZER SARS-COV-2 Vaccination 08/28/2019, 09/18/2019  . Tdap 03/11/2013    Past Medical History:  Diagnosis Date  . Allergy   . Arthritis   . Coronary artery disease   .  GERD (gastroesophageal reflux disease)   . History of hiatal hernia   . Hypercholesteremia   . Hypertension     Tobacco History: Social History   Tobacco Use  Smoking Status Former Smoker  . Packs/day: 1.00  . Years: 40.00  . Pack years: 40.00  . Types: Cigarettes  . Quit date: 2000  . Years since quitting: 21.9  Smokeless Tobacco Never Used   Counseling given: Yes   Continue to not smoke  Outpatient Encounter Medications as of 06/21/2020  Medication Sig  . albuterol (VENTOLIN HFA) 108 (90 Base) MCG/ACT inhaler Inhale 1-2 puffs into the lungs every 6 (six) hours as needed for wheezing or shortness of breath.  Marland Kitchen amLODipine (NORVASC) 5 MG tablet Take 1 tablet (5 mg total) by mouth daily.  Marland Kitchen aspirin EC 81 MG tablet Take 81 mg by mouth daily.   . calcium carbonate (OS-CAL - DOSED IN MG OF ELEMENTAL CALCIUM) 1250 (500 CA) MG tablet Take 1 tablet by mouth daily.   . cetirizine (ZYRTEC) 10 MG tablet TAKE 1 TABLET BY MOUTH DAILY  . CINNAMON PO Take 1,200 mg by mouth 2 times daily at 12 noon and 4 pm.  . diclofenac sodium (VOLTAREN) 1 % GEL Apply 2 g 2 (two) times daily topically. To affected joint.  . fluticasone (FLONASE) 50 MCG/ACT nasal spray TAKE 2 SPRAYS INTO BOTH NOSTRILS DAILY  . GLUCOSAMINE-CHONDROITIN PO Take 1 capsule by mouth daily.  . Misc Natural Products (BLACK CHERRY CONCENTRATE PO) Take 1,000 mg daily by mouth.  . Omega-3 Fatty Acids (FISH OIL) 1000 MG CAPS Take 1,000 mg by mouth daily.   Marland Kitchen omeprazole (PRILOSEC) 20 MG capsule Take 1 capsule (20 mg total) by mouth daily.  . sildenafil (REVATIO) 20 MG tablet TAKE 1 TO 2 TABLETS BY MOUTH EVERY DAY AS NEEDED  . simvastatin (ZOCOR) 40 MG tablet Take 1 tablet (40 mg total) by mouth daily.  Marland Kitchen telmisartan (MICARDIS) 20 MG tablet TAKE ONE TABLET EVERY DAY  . [DISCONTINUED] benzonatate (TESSALON) 200 MG capsule Take 1 capsule (200 mg total) by mouth 3 (three) times daily as needed for cough.  Jearl Klinefelter ELLIPTA 62.5-25 MCG/INH  AEPB Inhale 1 puff into the lungs daily.  . [DISCONTINUED] ANORO ELLIPTA 62.5-25 MCG/INH AEPB Inhale 1 puff into the lungs daily.  . [DISCONTINUED] Ipratropium-Albuterol (COMBIVENT RESPIMAT) 20-100 MCG/ACT AERS respimat Inhale 1 puff into the lungs every 6 (six) hours as needed for wheezing or shortness of breath (Cough). (Patient not taking: Reported on 06/21/2020)   No facility-administered encounter medications on file as of 06/21/2020.     Review of Systems  Review of Systems  Constitutional: Negative for activity change, chills, fatigue, fever and unexpected weight change.  HENT: Negative for  postnasal drip, rhinorrhea, sinus pressure, sinus pain and sore throat.   Eyes: Negative.   Respiratory: Positive for cough and shortness of breath. Negative for wheezing.   Cardiovascular: Negative for chest pain and palpitations.  Gastrointestinal: Negative for constipation, diarrhea, nausea and vomiting.  Endocrine: Negative.   Genitourinary: Negative.   Musculoskeletal: Negative.   Skin: Negative.   Neurological: Negative for dizziness and headaches.  Psychiatric/Behavioral: Negative.  Negative for dysphoric mood. The patient is not nervous/anxious.   All other systems reviewed and are negative.    Physical Exam  BP 130/74 (BP Location: Left Arm, Cuff Size: Normal)   Pulse 75   Temp (!) 97.1 F (36.2 C) (Temporal)   Ht 5\' 4"  (1.626 m)   Wt 161 lb (73 kg)   SpO2 96%   BMI 27.64 kg/m   Wt Readings from Last 5 Encounters:  06/21/20 161 lb (73 kg)  04/12/20 161 lb 9.6 oz (73.3 kg)  03/17/20 155 lb (70.3 kg)  02/10/20 156 lb 9.6 oz (71 kg)  12/31/19 158 lb (71.7 kg)    BMI Readings from Last 5 Encounters:  06/21/20 27.64 kg/m  04/12/20 27.74 kg/m  03/17/20 26.61 kg/m  02/10/20 26.88 kg/m  12/31/19 27.12 kg/m     Physical Exam Vitals and nursing note reviewed.  Constitutional:      General: He is not in acute distress.    Appearance: Normal appearance. He is  normal weight.  HENT:     Head: Normocephalic and atraumatic.     Right Ear: Hearing and external ear normal.     Left Ear: Hearing and external ear normal.     Nose: Nose normal. No mucosal edema or rhinorrhea.     Right Turbinates: Not enlarged.     Left Turbinates: Not enlarged.     Mouth/Throat:     Mouth: Mucous membranes are dry.     Pharynx: Oropharynx is clear. No oropharyngeal exudate.  Eyes:     Pupils: Pupils are equal, round, and reactive to light.  Cardiovascular:     Rate and Rhythm: Normal rate and regular rhythm.     Pulses: Normal pulses.     Heart sounds: Normal heart sounds. No murmur heard.   Pulmonary:     Effort: Pulmonary effort is normal.     Breath sounds: Normal breath sounds. No decreased breath sounds, wheezing or rales.  Musculoskeletal:     Cervical back: Normal range of motion.     Right lower leg: No edema.     Left lower leg: No edema.  Lymphadenopathy:     Cervical: No cervical adenopathy.  Skin:    General: Skin is warm and dry.     Capillary Refill: Capillary refill takes less than 2 seconds.     Findings: No erythema or rash.  Neurological:     General: No focal deficit present.     Mental Status: He is alert and oriented to person, place, and time.     Motor: No weakness.     Coordination: Coordination normal.     Gait: Gait is intact. Gait normal.  Psychiatric:        Mood and Affect: Mood normal.        Behavior: Behavior normal. Behavior is cooperative.        Thought Content: Thought content normal.        Judgment: Judgment normal.       Assessment & Plan:   Former smoker Plan: Continue not smoke  Healthcare maintenance Plan: Obtain COVID-19 vaccination  Allergic rhinitis Plan: Continue Zyrtec nasal saline rinses twice daily Continue Flonase 1 spray each nostril after nasal saline rinses daily  Emphysema, unspecified (HCC) Reviewed pulmonary function testing  Plan: Continue Anoro Ellipta Follow-up in 6  months     Return in about 6 months (around 12/19/2020), or if symptoms worsen or fail to improve, for [facility] - Dr. Patsey Berthold, Follow up with Wyn Quaker FNP-C.   Lauraine Rinne, NP 06/21/2020   This appointment required 32 minutes of patient care (this includes precharting, chart review, review of results, face-to-face care, etc.).

## 2020-06-21 NOTE — Assessment & Plan Note (Signed)
Plan: Obtain COVID-19 vaccination

## 2020-06-21 NOTE — Assessment & Plan Note (Signed)
Plan: Continue not smoke 

## 2020-06-21 NOTE — Assessment & Plan Note (Signed)
Plan: Continue Zyrtec nasal saline rinses twice daily Continue Flonase 1 spray each nostril after nasal saline rinses daily

## 2020-06-22 ENCOUNTER — Telehealth: Payer: Self-pay | Admitting: Pulmonary Disease

## 2020-06-22 NOTE — Telephone Encounter (Signed)
Great questions.  If the patient feels that his cough is under control and is not having a significant amount of postnasal drip he can stop using chlor tabs at night.  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  He can take Mucinex 1 to 2 tablets as needed daily for cough and congestion.  If he is not having a significant amount of congestion that he does not need to take Mucinex.  Wyn Quaker, FNP

## 2020-06-22 NOTE — Telephone Encounter (Signed)
Spoke with pt's wife. The medication that pt is taking that they couldn't remember at ov on 06/21/20 is Chlortab and pt is taking 2 tablets at bedtime. Pt wants to know if he should continue this along with the Zyrtec. Also pt has been taking Mucinex DM 1 tab in the am. His instructions from ov on 04/12/20 were to take 1-2 tabs every 12 hrs as needed. They would like to know if pt needs in increase what he is currently taking.  Please advise.

## 2020-06-22 NOTE — Telephone Encounter (Signed)
Patient is aware of below message/recommednations.  Patient voiced his understanding and had no further questions.  Nothing further needed.

## 2020-06-30 ENCOUNTER — Ambulatory Visit: Payer: PPO

## 2020-07-01 ENCOUNTER — Ambulatory Visit (INDEPENDENT_AMBULATORY_CARE_PROVIDER_SITE_OTHER): Payer: PPO

## 2020-07-01 VITALS — Ht 64.0 in | Wt 161.0 lb

## 2020-07-01 DIAGNOSIS — Z0000 Encounter for general adult medical examination without abnormal findings: Secondary | ICD-10-CM

## 2020-07-01 NOTE — Patient Instructions (Addendum)
James Peck , Thank you for taking time to come for your Medicare Wellness Visit. I appreciate your ongoing commitment to your health goals. Please review the following plan we discussed and let me know if I can assist you in the future.   These are the goals we discussed: Goals      Patient Stated   .  Weight (lb) < 155 lb (70.3 kg) (pt-stated)       This is a list of the screening recommended for you and due dates:  Health Maintenance  Topic Date Due  .  Hepatitis C: One time screening is recommended by Center for Disease Control  (CDC) for  adults born from [birth year] through 1964.   07/01/2021*  . Tetanus Vaccine  03/12/2023  . Flu Shot  Completed  . COVID-19 Vaccine  Completed  . Pneumonia vaccines  Discontinued  *Topic was postponed. The date shown is not the original due date.   Immunizations Immunization History  Administered Date(s) Administered  . Fluad Quad(high Dose 65+) 04/09/2019  . Influenza Split 05/11/2014  . Influenza, High Dose Seasonal PF 04/23/2017, 04/29/2018, 04/09/2019, 05/05/2020  . Influenza-Unspecified 04/25/2015, 04/23/2017  . PFIZER SARS-COV-2 Vaccination 08/28/2019, 09/18/2019  . Tdap 03/11/2013   Keep all routine maintenance appointments.   Next scheduled fasting lab 07/15/20 @ 8:15  Cpe 07/19/20 @ 9:00  Advanced directives: End of life planning; Advance aging; Advanced directives discussed.  Copy of current HCPOA/Living Will requested.    Conditions/risks identified: None new.  Follow up in one year for your annual wellness visit.  Preventive Care 3 Years and Older, Male Preventive care refers to lifestyle choices and visits with your health care provider that can promote health and wellness. What does preventive care include?  A yearly physical exam. This is also called an annual well check.  Dental exams once or twice a year.  Routine eye exams. Ask your health care provider how often you should have your eyes checked.  Personal  lifestyle choices, including:  Daily care of your teeth and gums.  Regular physical activity.  Eating a healthy diet.  Avoiding tobacco and drug use.  Limiting alcohol use.  Practicing safe sex.  Taking low doses of aspirin every day.  Taking vitamin and mineral supplements as recommended by your health care provider. What happens during an annual well check? The services and screenings done by your health care provider during your annual well check will depend on your age, overall health, lifestyle risk factors, and family history of disease. Counseling  Your health care provider may ask you questions about your:  Alcohol use.  Tobacco use.  Drug use.  Emotional well-being.  Home and relationship well-being.  Sexual activity.  Eating habits.  History of falls.  Memory and ability to understand (cognition).  Work and work Statistician. Screening  You may have the following tests or measurements:  Height, weight, and BMI.  Blood pressure.  Lipid and cholesterol levels. These may be checked every 5 years, or more frequently if you are over 86 years old.  Skin check.  Lung cancer screening. You may have this screening every year starting at age 66 if you have a 30-pack-year history of smoking and currently smoke or have quit within the past 15 years.  Fecal occult blood test (FOBT) of the stool. You may have this test every year starting at age 80.  Flexible sigmoidoscopy or colonoscopy. You may have a sigmoidoscopy every 5 years or a colonoscopy every 10 years  starting at age 38.  Prostate cancer screening. Recommendations will vary depending on your family history and other risks.  Hepatitis C blood test.  Hepatitis B blood test.  Sexually transmitted disease (STD) testing.  Diabetes screening. This is done by checking your blood sugar (glucose) after you have not eaten for a while (fasting). You may have this done every 1-3 years.  Abdominal aortic  aneurysm (AAA) screening. You may need this if you are a current or former smoker.  Osteoporosis. You may be screened starting at age 79 if you are at high risk. Talk with your health care provider about your test results, treatment options, and if necessary, the need for more tests. Vaccines  Your health care provider may recommend certain vaccines, such as:  Influenza vaccine. This is recommended every year.  Tetanus, diphtheria, and acellular pertussis (Tdap, Td) vaccine. You may need a Td booster every 10 years.  Zoster vaccine. You may need this after age 74.  Pneumococcal 13-valent conjugate (PCV13) vaccine. One dose is recommended after age 59.  Pneumococcal polysaccharide (PPSV23) vaccine. One dose is recommended after age 32. Talk to your health care provider about which screenings and vaccines you need and how often you need them. This information is not intended to replace advice given to you by your health care provider. Make sure you discuss any questions you have with your health care provider. Document Released: 08/12/2015 Document Revised: 04/04/2016 Document Reviewed: 05/17/2015 Elsevier Interactive Patient Education  2017 [place] Prevention in the Home Falls can cause injuries. They can happen to people of all ages. There are many things you can do to make your home safe and to help prevent falls. What can I do on the outside of my home?  Regularly fix the edges of walkways and driveways and fix any cracks.  Remove anything that might make you trip as you walk through a door, such as a raised step or threshold.  Trim any bushes or trees on the path to your home.  Use bright outdoor lighting.  Clear any walking paths of anything that might make someone trip, such as rocks or tools.  Regularly check to see if handrails are loose or broken. Make sure that both sides of any steps have handrails.  Any raised decks and porches should have guardrails on  the edges.  Have any leaves, snow, or ice cleared regularly.  Use sand or salt on walking paths during winter.  Clean up any spills in your garage right away. This includes oil or grease spills. What can I do in the bathroom?  Use night lights.  Install grab bars by the toilet and in the tub and shower. Do not use towel bars as grab bars.  Use non-skid mats or decals in the tub or shower.  If you need to sit down in the shower, use a plastic, non-slip stool.  Keep the floor dry. Clean up any water that spills on the floor as soon as it happens.  Remove soap buildup in the tub or shower regularly.  Attach bath mats securely with double-sided non-slip rug tape.  Do not have throw rugs and other things on the floor that can make you trip. What can I do in the bedroom?  Use night lights.  Make sure that you have a light by your bed that is easy to reach.  Do not use any sheets or blankets that are too big for your bed. They should not hang down  onto the floor.  Have a firm chair that has side arms. You can use this for support while you get dressed.  Do not have throw rugs and other things on the floor that can make you trip. What can I do in the kitchen?  Clean up any spills right away.  Avoid walking on wet floors.  Keep items that you use a lot in easy-to-reach places.  If you need to reach something above you, use a strong step stool that has a grab bar.  Keep electrical cords out of the way.  Do not use floor polish or wax that makes floors slippery. If you must use wax, use non-skid floor wax.  Do not have throw rugs and other things on the floor that can make you trip. What can I do with my stairs?  Do not leave any items on the stairs.  Make sure that there are handrails on both sides of the stairs and use them. Fix handrails that are broken or loose. Make sure that handrails are as long as the stairways.  Check any carpeting to make sure that it is firmly  attached to the stairs. Fix any carpet that is loose or worn.  Avoid having throw rugs at the top or bottom of the stairs. If you do have throw rugs, attach them to the floor with carpet tape.  Make sure that you have a light switch at the top of the stairs and the bottom of the stairs. If you do not have them, ask someone to add them for you. What else can I do to help prevent falls?  Wear shoes that:  Do not have high heels.  Have rubber bottoms.  Are comfortable and fit you well.  Are closed at the toe. Do not wear sandals.  If you use a stepladder:  Make sure that it is fully opened. Do not climb a closed stepladder.  Make sure that both sides of the stepladder are locked into place.  Ask someone to hold it for you, if possible.  Clearly mark and make sure that you can see:  Any grab bars or handrails.  First and last steps.  Where the edge of each step is.  Use tools that help you move around (mobility aids) if they are needed. These include:  Canes.  Walkers.  Scooters.  Crutches.  Turn on the lights when you go into a dark area. Replace any light bulbs as soon as they burn out.  Set up your furniture so you have a clear path. Avoid moving your furniture around.  If any of your floors are uneven, fix them.  If there are any pets around you, be aware of where they are.  Review your medicines with your doctor. Some medicines can make you feel dizzy. This can increase your chance of falling. Ask your doctor what other things that you can do to help prevent falls. This information is not intended to replace advice given to you by your health care provider. Make sure you discuss any questions you have with your health care provider. Document Released: 05/12/2009 Document Revised: 12/22/2015 Document Reviewed: 08/20/2014 Elsevier Interactive Patient Education  2017 [employer].

## 2020-07-01 NOTE — Progress Notes (Addendum)
Subjective:   James Peck is a 79 y.o. male who presents for Medicare Annual/Subsequent preventive examination.  Review of Systems    No ROS.  Medicare Wellness Virtual Visit.    Cardiac Risk Factors include: advanced age (>4men, >52 women);hypertension;male gender     Objective:    Today's Vitals   07/01/20 1358  Weight: 161 lb (73 kg)  Height: 5\' 4"  (3.382 m)   Body mass index is 27.64 kg/m.   Patient reports previous BP taken 05/31/20 123/62 P69,                                                          06/29/20 128/71 P73 Home recorded weight 153lb  Advanced Directives 07/01/2020 03/06/2020 06/30/2019 06/24/2018 05/27/2018 10/02/2017  Does Patient Have a Medical Advance Directive? Yes Yes Yes Yes Yes Yes  Type of Paramedic of [place];Living will - Living will Healthcare Power of [place] will  Does patient want to make changes to medical advance directive? No - Patient declined - No - Patient declined No - Patient declined - No - Patient declined  Copy of [place] in Chart? No - copy requested - - No - copy requested - -    Current Medications (verified) Outpatient Encounter Medications as of 07/01/2020  Medication Sig  . albuterol (VENTOLIN HFA) 108 (90 Base) MCG/ACT inhaler Inhale 1-2 puffs into the lungs every 6 (six) hours as needed for wheezing or shortness of breath.  Marland Kitchen amLODipine (NORVASC) 5 MG tablet Take 1 tablet (5 mg total) by mouth daily.  Jearl Klinefelter ELLIPTA 62.5-25 MCG/INH AEPB Inhale 1 puff into the lungs daily.  Marland Kitchen aspirin EC 81 MG tablet Take 81 mg by mouth daily.   . calcium carbonate (OS-CAL - DOSED IN MG OF ELEMENTAL CALCIUM) 1250 (500 CA) MG tablet Take 1 tablet by mouth daily.   . cetirizine (ZYRTEC) 10 MG tablet TAKE 1 TABLET BY MOUTH DAILY  . CINNAMON PO Take 1,200 mg by mouth 2 times daily at 12 noon and 4 pm.  . diclofenac sodium (VOLTAREN) 1 % GEL Apply 2 g 2 (two) times  daily topically. To affected joint.  . fluticasone (FLONASE) 50 MCG/ACT nasal spray TAKE 2 SPRAYS INTO BOTH NOSTRILS DAILY  . GLUCOSAMINE-CHONDROITIN PO Take 1 capsule by mouth daily.  . Misc Natural Products (BLACK CHERRY CONCENTRATE PO) Take 1,000 mg daily by mouth.  . Omega-3 Fatty Acids (FISH OIL) 1000 MG CAPS Take 1,000 mg by mouth daily.   Marland Kitchen omeprazole (PRILOSEC) 20 MG capsule Take 1 capsule (20 mg total) by mouth daily.  . sildenafil (REVATIO) 20 MG tablet TAKE 1 TO 2 TABLETS BY MOUTH EVERY DAY AS NEEDED  . simvastatin (ZOCOR) 40 MG tablet Take 1 tablet (40 mg total) by mouth daily.  Marland Kitchen telmisartan (MICARDIS) 20 MG tablet TAKE ONE TABLET EVERY DAY   No facility-administered encounter medications on file as of 07/01/2020.    Allergies (verified) Ace inhibitors   History: Past Medical History:  Diagnosis Date  . Allergy   . Arthritis   . Coronary artery disease   . GERD (gastroesophageal reflux disease)   . History of hiatal hernia   . Hypercholesteremia   . Hypertension    Past Surgical History:  Procedure Laterality Date  . CARDIAC CATHETERIZATION    . cardiac stents    . CATARACT EXTRACTION W/PHACO Right 09/10/2017   Procedure: CATARACT EXTRACTION PHACO AND INTRAOCULAR LENS PLACEMENT (IOC);  Surgeon: Birder Robson, MD;  Location: [facility] ORS;  Service: Ophthalmology;  Laterality: Right;  [place] 01:01.7 AP% 15.3 CDE 9.38 Fluid pack lot # [id number]  . CATARACT EXTRACTION W/PHACO Left 10/02/2017   Procedure: CATARACT EXTRACTION PHACO AND INTRAOCULAR LENS PLACEMENT (IOC);  Surgeon: Birder Robson, MD;  Location: [facility] ORS;  Service: Ophthalmology;  Laterality: Left;  [place] 00:42.3 AP% 16.2 CDE 6.85 Fluid Pack Lot # [id number]  . COLONOSCOPY WITH PROPOFOL N/A 06/28/2017   Procedure: COLONOSCOPY WITH PROPOFOL;  Surgeon: Manya Silvas, MD;  Location: [facility] ENDOSCOPY;  Service: Endoscopy;  Laterality: N/A;  . CORONARY ANGIOPLASTY     STENTS X 2  . KNEE ARTHROSCOPY     Family  History  Problem Relation Age of Onset  . Heart disease Mother   . Diabetes Mother   . Heart disease Father   . Diabetes Father   . Alzheimer's disease Brother   . Stroke Brother    Social History   Socioeconomic History  . Marital status: Married    Spouse name: Not on file  . Number of children: Not on file  . Years of education: Not on file  . Highest education level: Not on file  Occupational History  . Not on file  Tobacco Use  . Smoking status: Former Smoker    Packs/day: 1.00    Years: 40.00    Pack years: 40.00    Types: Cigarettes    Quit date: 2000    Years since quitting: 21.9  . Smokeless tobacco: Never Used  Vaping Use  . Vaping Use: Never used  Substance and Sexual Activity  . Alcohol use: No  . Drug use: No  . Sexual activity: Not on file  Other Topics Concern  . Not on file  Social History Narrative  . Not on file   Social Determinants of Health   Financial Resource Strain: Low Risk   . Difficulty of Paying Living Expenses: Not hard at all  Food Insecurity: No Food Insecurity  . Worried About Charity fundraiser in the Last Year: Never true  . Ran Out of Food in the Last Year: Never true  Transportation Needs: No Transportation Needs  . Lack of Transportation (Medical): No  . Lack of Transportation (Non-Medical): No  Physical Activity: Sufficiently Active  . Days of Exercise per Week: 5 days  . Minutes of Exercise per Session: 30 min  Stress: No Stress Concern Present  . Feeling of Stress : Not at all  Social Connections: Unknown  . Frequency of Communication with Friends and Family: Not on file  . Frequency of Social Gatherings with Friends and Family: Not on file  . Attends Religious Services: Not on file  . Active Member of Clubs or Organizations: Not on file  . Attends Archivist Meetings: Not on file  . Marital Status: Married    Tobacco Counseling Counseling given: Not Answered   Clinical Intake:  Pre-visit  preparation completed: Yes           How often do you need to have someone help you when you read instructions, pamphlets, or other written materials from your doctor or pharmacy?: 1 - Never Interpreter Needed?: No      Activities of Daily Living In your present state of health, do you  have any difficulty performing the following activities: 07/01/2020  Hearing? N  Vision? N  Difficulty concentrating or making decisions? N  Walking or climbing stairs? N  Dressing or bathing? N  Doing errands, shopping? N  Preparing Food and eating ? N  Using the Toilet? N  In the past six months, have you accidently leaked urine? N  Do you have problems with loss of bowel control? N  Managing your Medications? N  Managing your Finances? N  Housekeeping or managing your Housekeeping? N  Some recent data might be hidden    Patient Care Team: Einar Pheasant, MD as PCP - General (Internal Medicine)  Indicate any recent Medical Services you may have received from other than [facility] providers in the past year (date may be approximate).     Assessment:   This is a routine wellness examination for James Peck.  I connected with James Peck today by telephone and verified that I am speaking with the correct person using two identifiers. Location patient: home Location provider: work Persons participating in the virtual visit: patient, Marine scientist.    I discussed the limitations, risks, security and privacy concerns of performing an evaluation and management service by telephone and the availability of in person appointments. The patient expressed understanding and verbally consented to this telephonic visit.    Interactive audio and video telecommunications were attempted between this provider and patient, however failed, due to patient having technical difficulties OR patient did not have access to video capability.  We continued and completed visit with audio only.  Some vital signs may be absent or patient  reported.   Hearing/Vision screen  Hearing Screening   125Hz  250Hz  500Hz  1000Hz  2000Hz  3000Hz  4000Hz  6000Hz  8000Hz   Right ear:           Left ear:           Comments: Patient is able to hear conversational tones without difficulty. No issues reported.  Vision Screening Comments: Followed by [facility]  Cataract extracted, bilateral   Dietary issues and exercise activities discussed: Current Exercise Habits: Home exercise routine, Type of exercise: walking, Time (Minutes): 30, Frequency (Times/Week): 5, Weekly Exercise (Minutes/Week): 150, Intensity: MildHealthy diet Good water intake  Goals      Patient Stated   .  Weight (lb) < 155 lb (70.3 kg) (pt-stated)      Depression Screen PHQ 2/9 Scores 07/01/2020 06/30/2019 02/27/2019 01/29/2019 06/24/2018 05/27/2018 01/06/2018  PHQ - 2 Score 0 0 0 0 0 0 0  PHQ- 9 Score - - - 0 - - -    Fall Risk Fall Risk  07/01/2020 02/23/2020 06/30/2019 02/27/2019 01/29/2019  Falls in the past year? 0 0 0 0 0  Comment - Emmi Telephone Survey: data to providers prior to load - - -  Number falls in past yr: 0 - - 0 0  Injury with Fall? 0 - - - 0  Follow up Falls evaluation completed - Falls prevention discussed;Education provided Falls evaluation completed Falls evaluation completed   Handrails inn use when climbing stairs? Yes Home free of loose throw rugs in walkways, pet beds, electrical cords, etc? Yes  Adequate lighting in your home to reduce risk of falls? Yes   ASSISTIVE DEVICES UTILIZED TO PREVENT FALLS: Use of a cane, walker or w/c? No   TIMED UP AND GO: Was the test performed? No . Virtual visit.   Cognitive Function: MMSE - Mini Mental State Exam 06/24/2018  Orientation to time 5  Orientation to Place  5  Registration 3  Attention/ Calculation 5  Recall 3  Language- name 2 objects 2  Language- repeat 1  Language- follow 3 step command 3  Language- read & follow direction 1  Write a sentence 1  Copy design 1  Total score 30      6CIT Screen 07/01/2020 06/30/2019  What Year? 0 points 0 points  What month? 0 points 0 points  What time? 0 points 0 points  Count back from 20 - 0 points  Months in reverse 0 points 0 points  Repeat phrase 2 points 0 points  Total Score - 0    Immunizations Immunization History  Administered Date(s) Administered  . Fluad Quad(high Dose 65+) 04/09/2019  . Influenza Split 05/11/2014  . Influenza, High Dose Seasonal PF 04/23/2017, 04/29/2018, 04/09/2019, 05/05/2020  . Influenza-Unspecified 04/25/2015, 04/23/2017  . PFIZER SARS-COV-2 Vaccination 08/28/2019, 09/18/2019  . Tdap 03/11/2013   Health Maintenance Health Maintenance  Topic Date Due  . Hepatitis C Screening  07/01/2021 (Originally 06-09-1941)  . TETANUS/TDAP  03/12/2023  . INFLUENZA VACCINE  Completed  . COVID-19 Vaccine  Completed  . PNA vac Low Risk Adult  Discontinued   Colorectal cancer screening: Completed 06/28/17. Repeat every 3 years  DG Chest 2 View: Completed 03/06/20.  Hepatitis C Screening: does not qualify.  Vision Screening: Recommended annual ophthalmology exams for early detection of glaucoma and other disorders of the eye. Is the patient up to date with their annual eye exam?  Yes   Dental Screening: Recommended annual dental exams for proper oral hygiene.  Community Resource Referral / Chronic Care Management: CRR required this visit?  No   CCM required this visit?  No      Plan:   Keep all routine maintenance appointments.   Next scheduled fasting lab 07/15/20 @ 8:15  Cpe 07/19/20 @ 9:00  I have personally reviewed and noted the following in the patient's chart:   . Medical and social history . Use of alcohol, tobacco or illicit drugs  . Current medications and supplements . Functional ability and status . Nutritional status . Physical activity . Advanced directives . List of other physicians . Hospitalizations, surgeries, and ER visits in previous 12  months . Vitals . Screenings to include cognitive, depression, and falls . Referrals and appointments  In addition, I have reviewed and discussed with patient certain preventive protocols, quality metrics, and best practice recommendations. A written personalized care plan for preventive services as well as general preventive health recommendations were provided to patient via mychart.     Varney Biles, LPN   60/12/43    Reviewed above information.  Agree with assessment and plan.    Dr Nicki Reaper

## 2020-07-14 NOTE — Progress Notes (Signed)
Agree with the details of the visit as noted by Brian Mack, NP.  C. Laura Ardyce Heyer, MD [place] [facility] 

## 2020-07-15 ENCOUNTER — Other Ambulatory Visit (INDEPENDENT_AMBULATORY_CARE_PROVIDER_SITE_OTHER): Payer: PPO

## 2020-07-15 ENCOUNTER — Other Ambulatory Visit: Payer: Self-pay

## 2020-07-15 DIAGNOSIS — R739 Hyperglycemia, unspecified: Secondary | ICD-10-CM | POA: Diagnosis not present

## 2020-07-15 DIAGNOSIS — E785 Hyperlipidemia, unspecified: Secondary | ICD-10-CM

## 2020-07-15 DIAGNOSIS — I10 Essential (primary) hypertension: Secondary | ICD-10-CM | POA: Diagnosis not present

## 2020-07-15 LAB — BASIC METABOLIC PANEL
BUN: 12 mg/dL (ref 6–23)
CO2: 25 mEq/L (ref 19–32)
Calcium: 9.5 mg/dL (ref 8.4–10.5)
Chloride: 103 mEq/L (ref 96–112)
Creatinine, Ser: 0.97 mg/dL (ref 0.40–1.50)
GFR: 74.6 mL/min (ref >60.00)
Glucose, Bld: 109 mg/dL — ABNORMAL HIGH (ref 70–99)
Potassium: 4.3 mEq/L (ref 3.5–5.1)
Sodium: 135 mEq/L (ref 135–145)

## 2020-07-15 LAB — LIPID PANEL
Cholesterol: 140 mg/dL (ref 0–200)
HDL: 41.1 mg/dL (ref >39.00)
LDL Cholesterol: 79 mg/dL (ref 0–99)
NonHDL: 98.94
Total CHOL/HDL Ratio: 3
Triglycerides: 98 mg/dL (ref 0.0–149.0)
VLDL: 19.6 mg/dL (ref 0.0–40.0)

## 2020-07-15 LAB — HEPATIC FUNCTION PANEL
ALT: 24 U/L (ref 0–53)
AST: 22 U/L (ref 0–37)
Albumin: 4.2 g/dL (ref 3.5–5.2)
Alkaline Phosphatase: 61 U/L (ref 39–117)
Bilirubin, Direct: 0.1 mg/dL (ref 0.0–0.3)
Total Bilirubin: 0.6 mg/dL (ref 0.2–1.2)
Total Protein: 6.7 g/dL (ref 6.0–8.3)

## 2020-07-15 LAB — HEMOGLOBIN A1C: Hgb A1c MFr Bld: 6.2 % (ref 4.6–6.5)

## 2020-07-19 ENCOUNTER — Other Ambulatory Visit: Payer: Self-pay

## 2020-07-19 ENCOUNTER — Encounter: Payer: Self-pay | Admitting: Internal Medicine

## 2020-07-19 ENCOUNTER — Telehealth (INDEPENDENT_AMBULATORY_CARE_PROVIDER_SITE_OTHER): Payer: PPO | Admitting: Internal Medicine

## 2020-07-19 VITALS — BP 120/59 | Ht 64.0 in | Wt 154.6 lb

## 2020-07-19 DIAGNOSIS — Z20822 Contact with and (suspected) exposure to covid-19: Secondary | ICD-10-CM

## 2020-07-19 DIAGNOSIS — J439 Emphysema, unspecified: Secondary | ICD-10-CM

## 2020-07-19 DIAGNOSIS — Z125 Encounter for screening for malignant neoplasm of prostate: Secondary | ICD-10-CM | POA: Diagnosis not present

## 2020-07-19 DIAGNOSIS — R739 Hyperglycemia, unspecified: Secondary | ICD-10-CM

## 2020-07-19 DIAGNOSIS — R252 Cramp and spasm: Secondary | ICD-10-CM

## 2020-07-19 DIAGNOSIS — I10 Essential (primary) hypertension: Secondary | ICD-10-CM

## 2020-07-19 DIAGNOSIS — E785 Hyperlipidemia, unspecified: Secondary | ICD-10-CM | POA: Diagnosis not present

## 2020-07-19 NOTE — Progress Notes (Signed)
Patient ID: James Peck, male   DOB: [birth year], 79 y.o.   MRN: [id number]   Virtual Visit via telephone Note  This visit type was conducted due to national recommendations for restrictions regarding the COVID-19 pandemic (e.g. social distancing).  This format is felt to be most appropriate for this patient at this time.  All issues noted in this document were discussed and addressed.  No physical exam was performed (except for noted visual exam findings with Video Visits).   I connected with James Peck by telephone and verified that I am speaking with the correct person using two identifiers. Location patient: home Location provider: work  Persons participating in the telephone visit: patient, provider  The limitations, risks, security and privacy concerns of performing an evaluation and management service by telephone and the availability of in person appointments have been discussed.  It has also been discussed with the patient that there may be a patient responsible charge related to this service. The patient expressed understanding and agreed to proceed.   Reason for visit: work in appt  HPI: Work in appt for evaluation after covid exposure. States that approximately 5 days ago, he was outside talking to a man - to sell him a tractor.  The next day, sat in the truck with him for about 15 minutes.  Windows down.  Two days later, the man called and informed [name] that he tested positive for covid.  Had aching and loss of taste and smell.  His family positive for covid.  [name] called because of his exposure.  He does not feel bad and has no symptoms.  No nasal congestion, sinus congestion or sore throat.  No cough, congestion or sob.  No chest pain.  Wants to be tested.  Planning to get together with family for Christmas.  Of note, has seen pulmonary.  anoro - helped.  Using flonase and taking zyrtec.  Slow mag - helping cramps.     ROS: See pertinent positives and negatives per  HPI.  Past Medical History:  Diagnosis Date  . Allergy   . Arthritis   . Coronary artery disease   . GERD (gastroesophageal reflux disease)   . History of hiatal hernia   . Hypercholesteremia   . Hypertension     Past Surgical History:  Procedure Laterality Date  . CARDIAC CATHETERIZATION    . cardiac stents    . CATARACT EXTRACTION W/PHACO Right 09/10/2017   Procedure: CATARACT EXTRACTION PHACO AND INTRAOCULAR LENS PLACEMENT (IOC);  Surgeon: Birder Robson, MD;  Location: [facility] ORS;  Service: Ophthalmology;  Laterality: Right;  [place] 01:01.7 AP% 15.3 CDE 9.38 Fluid pack lot # [id number]  . CATARACT EXTRACTION W/PHACO Left 10/02/2017   Procedure: CATARACT EXTRACTION PHACO AND INTRAOCULAR LENS PLACEMENT (IOC);  Surgeon: Birder Robson, MD;  Location: [facility] ORS;  Service: Ophthalmology;  Laterality: Left;  [place] 00:42.3 AP% 16.2 CDE 6.85 Fluid Pack Lot # [id number]  . COLONOSCOPY WITH PROPOFOL N/A 06/28/2017   Procedure: COLONOSCOPY WITH PROPOFOL;  Surgeon: Manya Silvas, MD;  Location: [facility] ENDOSCOPY;  Service: Endoscopy;  Laterality: N/A;  . CORONARY ANGIOPLASTY     STENTS X 2  . KNEE ARTHROSCOPY      Family History  Problem Relation Age of Onset  . Heart disease Mother   . Diabetes Mother   . Heart disease Father   . Diabetes Father   . Alzheimer's disease Brother   . Stroke Brother     SOCIAL HX: reviewed.  Current Outpatient Medications:  .  albuterol (VENTOLIN HFA) 108 (90 Base) MCG/ACT inhaler, Inhale 1-2 puffs into the lungs every 6 (six) hours as needed for wheezing or shortness of breath., Disp: , Rfl:  .  amLODipine (NORVASC) 5 MG tablet, Take 1 tablet (5 mg total) by mouth daily., Disp: 90 tablet, Rfl: 2 .  ANORO ELLIPTA 62.5-25 MCG/INH AEPB, Inhale 1 puff into the lungs daily., Disp: 90 each, Rfl: 3 .  aspirin EC 81 MG tablet, Take 81 mg by mouth daily. , Disp: , Rfl:  .  benzonatate (TESSALON) 200 MG capsule, Take 200 mg by mouth 3 (three) times daily as  needed., Disp: , Rfl:  .  calcium carbonate (OS-CAL - DOSED IN MG OF ELEMENTAL CALCIUM) 1250 (500 CA) MG tablet, Take 1 tablet by mouth daily. , Disp: , Rfl:  .  cetirizine (ZYRTEC) 10 MG tablet, TAKE 1 TABLET BY MOUTH DAILY, Disp: 90 tablet, Rfl: 1 .  CINNAMON PO, Take 1,200 mg by mouth 2 times daily at 12 noon and 4 pm., Disp: , Rfl:  .  Dextromethorphan-guaiFENesin ([place] DM PO), Take by mouth daily., Disp: , Rfl:  .  diclofenac sodium (VOLTAREN) 1 % GEL, Apply 2 g 2 (two) times daily topically. To affected joint., Disp: 300 g, Rfl: 11 .  fluticasone (FLONASE) 50 MCG/ACT nasal spray, TAKE 2 SPRAYS INTO BOTH NOSTRILS DAILY, Disp: 48 g, Rfl: 3 .  GLUCOSAMINE-CHONDROITIN PO, Take 1 capsule by mouth daily., Disp: , Rfl:  .  Misc Natural Products (BLACK CHERRY CONCENTRATE PO), Take 1,000 mg daily by mouth., Disp: , Rfl:  .  Omega-3 Fatty Acids (FISH OIL) 1000 MG CAPS, Take 1,000 mg by mouth daily. , Disp: , Rfl:  .  omeprazole (PRILOSEC) 20 MG capsule, Take 1 capsule (20 mg total) by mouth daily., Disp: 90 capsule, Rfl: 2 .  sildenafil (REVATIO) 20 MG tablet, TAKE 1 TO 2 TABLETS BY MOUTH EVERY DAY AS NEEDED, Disp: 60 tablet, Rfl: 0 .  simvastatin (ZOCOR) 40 MG tablet, Take 1 tablet (40 mg total) by mouth daily., Disp: 90 tablet, Rfl: 2 .  telmisartan (MICARDIS) 20 MG tablet, TAKE ONE TABLET EVERY DAY, Disp: 90 tablet, Rfl: 1  EXAM:  GENERAL: alert. Sounds to be in no acute distress.  Answering questions appropriately.   PSYCH/NEURO: pleasant and cooperative, no obvious depression or anxiety, speech and thought processing grossly intact  ASSESSMENT AND PLAN:  Discussed the following assessment and plan:  Problem List Items Addressed This Visit    Benign essential HTN    On micardis and amlodipine.  Follow pressures.  Follow metabolic panel.       Relevant Orders   Basic metabolic panel   HLD (hyperlipidemia)    On simvastatin.  Low cholesterol diet and exercise.  Follow lipid panel  and liver function tests.        Relevant Orders   Lipid panel   Hepatic function panel   Hyperglycemia    Low carb diet and exercise.  Follow met b and a1c.       Relevant Orders   Hemoglobin A1c   Leg cramps    Slow mag helping.  Follow.       Emphysema, unspecified ([place])    Continue anoro.  Helping.  Continue f/u with pulmonary.        Relevant Medications   benzonatate (TESSALON) 200 MG capsule   Exposure to confirmed case of COVID-19    Exposure as outlined.  Currently doing  well.  Will arrange for covid testing today.  Discussed quarantine.  Follow for development of symptoms.  Call with update.         Other Visit Diagnoses    Exposure to COVID-19 virus    -  Primary   Relevant Orders   Novel Coronavirus, NAA ([facility]) (Completed)   Prostate cancer screening       Relevant Orders   PSA, Medicare       I discussed the assessment and treatment plan with the patient. The patient was provided an opportunity to ask questions and all were answered. The patient agreed with the plan and demonstrated an understanding of the instructions.   The patient was advised to call back or seek an in-person evaluation if the symptoms worsen or if the condition fails to improve as anticipated.  I provided 23 minutes of non-face-to-face time during this encounter.   Einar Pheasant, MD

## 2020-07-21 ENCOUNTER — Telehealth: Payer: Self-pay | Admitting: Internal Medicine

## 2020-07-21 LAB — SARS-COV-2, NAA 2 DAY TAT

## 2020-07-21 LAB — NOVEL CORONAVIRUS, NAA: SARS-CoV-2, NAA: NOT DETECTED

## 2020-07-21 NOTE — Telephone Encounter (Signed)
My chart message sent to pt for update.   

## 2020-07-26 ENCOUNTER — Encounter: Payer: Self-pay | Admitting: Internal Medicine

## 2020-07-26 DIAGNOSIS — Z20822 Contact with and (suspected) exposure to covid-19: Secondary | ICD-10-CM | POA: Insufficient documentation

## 2020-07-26 NOTE — Assessment & Plan Note (Signed)
Continue anoro.  Helping.  Continue f/u with pulmonary.

## 2020-07-26 NOTE — Assessment & Plan Note (Signed)
Slow mag helping.  Follow.

## 2020-07-26 NOTE — Assessment & Plan Note (Signed)
On simvastatin.  Low cholesterol diet and exercise.  Follow lipid panel and liver function tests.   

## 2020-07-26 NOTE — Assessment & Plan Note (Signed)
Low carb diet and exercise.  Follow met b and a1c.  

## 2020-07-26 NOTE — Assessment & Plan Note (Signed)
On micardis and amlodipine.  Follow pressures.  Follow metabolic panel.

## 2020-07-26 NOTE — Assessment & Plan Note (Signed)
Exposure as outlined.  Currently doing well.  Will arrange for covid testing today.  Discussed quarantine.  Follow for development of symptoms.  Call with update.

## 2020-08-11 ENCOUNTER — Ambulatory Visit (INDEPENDENT_AMBULATORY_CARE_PROVIDER_SITE_OTHER): Payer: PPO | Admitting: Urology

## 2020-08-11 ENCOUNTER — Other Ambulatory Visit: Payer: Self-pay

## 2020-08-11 ENCOUNTER — Encounter: Payer: Self-pay | Admitting: Urology

## 2020-08-11 VITALS — BP 123/76 | HR 73 | Ht 64.0 in | Wt 155.0 lb

## 2020-08-11 DIAGNOSIS — R310 Gross hematuria: Secondary | ICD-10-CM

## 2020-08-11 NOTE — Progress Notes (Signed)
08/11/2020 4:43 PM   James Peck 19-Sep-1940 [id number]  Referring provider: Einar Pheasant, [place] [address] [place],  [place] [address]  Chief Complaint  Patient presents with  . Follow-up    HPI: 80 y.o. male presents for 75-month follow-up.   High risk hematuria evaluation July 2021 after an episode of total gross painless hematuria  No significant upper tract abnormalities on CTU  Cystoscopy with prominent lateral lobe enlargement with hypervascularity  ~2 weeks ago had small amount of blood at the beginning of his urinary stream which lasted 2-3 days  On the second day he thinks he may have passed a small stone; described as dark with projections and did not dissolve in the toilet  Also noted some blood in the semen   PMH: Past Medical History:  Diagnosis Date  . Allergy   . Arthritis   . Coronary artery disease   . GERD (gastroesophageal reflux disease)   . History of hiatal hernia   . Hypercholesteremia   . Hypertension     Surgical History: Past Surgical History:  Procedure Laterality Date  . CARDIAC CATHETERIZATION    . cardiac stents    . CATARACT EXTRACTION W/PHACO Right 09/10/2017   Procedure: CATARACT EXTRACTION PHACO AND INTRAOCULAR LENS PLACEMENT (IOC);  Surgeon: Birder Robson, MD;  Location: [facility] ORS;  Service: Ophthalmology;  Laterality: Right;  [place] 01:01.7 AP% 15.3 CDE 9.38 Fluid pack lot # [id number]  . CATARACT EXTRACTION W/PHACO Left 10/02/2017   Procedure: CATARACT EXTRACTION PHACO AND INTRAOCULAR LENS PLACEMENT (IOC);  Surgeon: Birder Robson, MD;  Location: [facility] ORS;  Service: Ophthalmology;  Laterality: Left;  [place] 00:42.3 AP% 16.2 CDE 6.85 Fluid Pack Lot # [id number]  . COLONOSCOPY WITH PROPOFOL N/A 06/28/2017   Procedure: COLONOSCOPY WITH PROPOFOL;  Surgeon: Manya Silvas, MD;  [facility];  Service: Endoscopy;  Laterality: N/A;  . CORONARY ANGIOPLASTY     STENTS X 2  . KNEE ARTHROSCOPY       Home Medications:  Allergies as of 08/11/2020      Reactions   Ace Inhibitors    Cough      Medication List       Accurate as of August 11, 2020  4:43 PM. If you have any questions, ask your nurse or doctor.        albuterol 108 (90 Base) MCG/ACT inhaler Commonly known as: VENTOLIN HFA Inhale 1-2 puffs into the lungs every 6 (six) hours as needed for wheezing or shortness of breath.   amLODipine 5 MG tablet Commonly known as: NORVASC Take 1 tablet (5 mg total) by mouth daily.   Anoro Ellipta 62.5-25 MCG/INH Aepb Generic drug: umeclidinium-vilanterol Inhale 1 puff into the lungs daily.   aspirin EC 81 MG tablet Take 81 mg by mouth daily.   benzonatate 200 MG capsule Commonly known as: TESSALON Take 200 mg by mouth 3 (three) times daily as needed.   BLACK CHERRY CONCENTRATE PO Take 1,000 mg daily by mouth.   calcium carbonate 1250 (500 Ca) MG tablet Commonly known as: OS-CAL - dosed in mg of elemental calcium Take 1 tablet by mouth daily.   cetirizine 10 MG tablet Commonly known as: ZYRTEC TAKE 1 TABLET BY MOUTH DAILY   CINNAMON PO Take 1,200 mg by mouth 2 times daily at 12 noon and 4 pm.   diclofenac sodium 1 % Gel Commonly known as: VOLTAREN Apply 2 g 2 (two) times daily topically. To affected joint.   Fish Oil  1000 MG Caps Take 1,000 mg by mouth daily.   fluticasone 50 MCG/ACT nasal spray Commonly known as: FLONASE TAKE 2 SPRAYS INTO BOTH NOSTRILS DAILY   GLUCOSAMINE-CHONDROITIN PO Take 1 capsule by mouth daily.   MUCINEX DM PO Take by mouth daily.   omeprazole 20 MG capsule Commonly known as: PRILOSEC Take 1 capsule (20 mg total) by mouth daily.   sildenafil 20 MG tablet Commonly known as: REVATIO TAKE 1 TO 2 TABLETS BY MOUTH EVERY DAY AS NEEDED   simvastatin 40 MG tablet Commonly known as: ZOCOR Take 1 tablet (40 mg total) by mouth daily.   telmisartan 20 MG tablet Commonly known as: MICARDIS TAKE ONE TABLET EVERY DAY        Allergies:  Allergies  Allergen Reactions  . Ace Inhibitors     Cough    Family History: Family History  Problem Relation Age of Onset  . Heart disease Mother   . Diabetes Mother   . Heart disease Father   . Diabetes Father   . Alzheimer's disease Brother   . Stroke Brother     Social History:  reports that he quit smoking about 22 years ago. His smoking use included cigarettes. He has a 40.00 pack-year smoking history. He has never used smokeless tobacco. He reports that he does not drink alcohol and does not use drugs.   Physical Exam: BP 123/76   Pulse 73   Ht 5\' 4"  (1.626 m)   Wt 155 lb (70.3 kg)   BMI 26.61 kg/m   Constitutional:  Alert and oriented, No acute distress. HEENT: [place] AT, moist mucus membranes.  Trachea midline, no masses. Cardiovascular: No clubbing, cyanosis, or edema. Respiratory: Normal respiratory effort, no increased work of breathing. Skin: No rashes, bruises or suspicious lesions. Neurologic: Grossly intact, no focal deficits, moving all 4 extremities. Psychiatric: Normal mood and affect.  Laboratory Data:  Urinalysis Dipstick 1+ blood Microscopy 3-10 RBC   Assessment & Plan:    1. Gross hematuria  Brief episode of initial stream hematuria most likely prostatic in etiology  Minimal microhematuria today  Repeat urinalysis 1 month   Abbie Sons, MD  [facility] [address] [place], [place] [address] [phone]

## 2020-08-12 LAB — URINALYSIS, COMPLETE
Bilirubin, UA: NEGATIVE
Glucose, UA: NEGATIVE
Ketones, UA: NEGATIVE
Leukocytes,UA: NEGATIVE
Nitrite, UA: NEGATIVE
Protein,UA: NEGATIVE
Specific Gravity, UA: 1.015 (ref 1.005–1.030)
Urobilinogen, Ur: 0.2 mg/dL (ref 0.2–1.0)
pH, UA: 6 (ref 5.0–7.5)

## 2020-08-12 LAB — MICROSCOPIC EXAMINATION
Bacteria, UA: NONE SEEN
Epithelial Cells (non renal): NONE SEEN /hpf (ref 0–10)

## 2020-08-15 ENCOUNTER — Encounter: Payer: Self-pay | Admitting: Urology

## 2020-08-24 ENCOUNTER — Other Ambulatory Visit: Payer: Self-pay | Admitting: Internal Medicine

## 2020-08-31 ENCOUNTER — Other Ambulatory Visit: Payer: Self-pay

## 2020-08-31 DIAGNOSIS — R310 Gross hematuria: Secondary | ICD-10-CM

## 2020-09-08 ENCOUNTER — Other Ambulatory Visit: Payer: Self-pay | Admitting: Pulmonary Disease

## 2020-09-11 ENCOUNTER — Other Ambulatory Visit: Payer: Self-pay

## 2020-09-11 ENCOUNTER — Emergency Department: Payer: PPO

## 2020-09-11 ENCOUNTER — Inpatient Hospital Stay
Admission: EM | Admit: 2020-09-11 | Discharge: 2020-09-13 | DRG: 281 | Disposition: A | Discharge status: Home / Self Care | Payer: PPO | Attending: Internal Medicine | Admitting: Internal Medicine

## 2020-09-11 ENCOUNTER — Encounter: Payer: Self-pay | Admitting: Emergency Medicine

## 2020-09-11 DIAGNOSIS — E7800 Pure hypercholesterolemia, unspecified: Secondary | ICD-10-CM | POA: Diagnosis not present

## 2020-09-11 DIAGNOSIS — Z888 Allergy status to other drugs, medicaments and biological substances status: Secondary | ICD-10-CM | POA: Diagnosis not present

## 2020-09-11 DIAGNOSIS — Z87891 Personal history of nicotine dependence: Secondary | ICD-10-CM

## 2020-09-11 DIAGNOSIS — I252 Old myocardial infarction: Secondary | ICD-10-CM | POA: Diagnosis not present

## 2020-09-11 DIAGNOSIS — Z9842 Cataract extraction status, left eye: Secondary | ICD-10-CM | POA: Diagnosis not present

## 2020-09-11 DIAGNOSIS — Z820 Family history of epilepsy and other diseases of the nervous system: Secondary | ICD-10-CM

## 2020-09-11 DIAGNOSIS — R079 Chest pain, unspecified: Secondary | ICD-10-CM | POA: Diagnosis not present

## 2020-09-11 DIAGNOSIS — I10 Essential (primary) hypertension: Secondary | ICD-10-CM | POA: Diagnosis present

## 2020-09-11 DIAGNOSIS — I214 Non-ST elevation (NSTEMI) myocardial infarction: Principal | ICD-10-CM | POA: Diagnosis present

## 2020-09-11 DIAGNOSIS — K219 Gastro-esophageal reflux disease without esophagitis: Secondary | ICD-10-CM | POA: Diagnosis present

## 2020-09-11 DIAGNOSIS — J441 Chronic obstructive pulmonary disease with (acute) exacerbation: Secondary | ICD-10-CM

## 2020-09-11 DIAGNOSIS — I2510 Atherosclerotic heart disease of native coronary artery without angina pectoris: Secondary | ICD-10-CM | POA: Diagnosis present

## 2020-09-11 DIAGNOSIS — Z961 Presence of intraocular lens: Secondary | ICD-10-CM | POA: Diagnosis not present

## 2020-09-11 DIAGNOSIS — Z823 Family history of stroke: Secondary | ICD-10-CM

## 2020-09-11 DIAGNOSIS — Z20822 Contact with and (suspected) exposure to covid-19: Secondary | ICD-10-CM | POA: Diagnosis not present

## 2020-09-11 DIAGNOSIS — Z8249 Family history of ischemic heart disease and other diseases of the circulatory system: Secondary | ICD-10-CM

## 2020-09-11 DIAGNOSIS — Z955 Presence of coronary angioplasty implant and graft: Secondary | ICD-10-CM | POA: Diagnosis not present

## 2020-09-11 DIAGNOSIS — Z9841 Cataract extraction status, right eye: Secondary | ICD-10-CM | POA: Diagnosis not present

## 2020-09-11 DIAGNOSIS — E785 Hyperlipidemia, unspecified: Secondary | ICD-10-CM

## 2020-09-11 DIAGNOSIS — R0602 Shortness of breath: Secondary | ICD-10-CM | POA: Diagnosis not present

## 2020-09-11 DIAGNOSIS — Z7982 Long term (current) use of aspirin: Secondary | ICD-10-CM

## 2020-09-11 DIAGNOSIS — J439 Emphysema, unspecified: Secondary | ICD-10-CM

## 2020-09-11 DIAGNOSIS — Z833 Family history of diabetes mellitus: Secondary | ICD-10-CM | POA: Diagnosis not present

## 2020-09-11 DIAGNOSIS — R0789 Other chest pain: Secondary | ICD-10-CM | POA: Diagnosis not present

## 2020-09-11 DIAGNOSIS — I499 Cardiac arrhythmia, unspecified: Secondary | ICD-10-CM | POA: Diagnosis not present

## 2020-09-11 DIAGNOSIS — Z79899 Other long term (current) drug therapy: Secondary | ICD-10-CM | POA: Diagnosis not present

## 2020-09-11 DIAGNOSIS — I25110 Atherosclerotic heart disease of native coronary artery with unstable angina pectoris: Secondary | ICD-10-CM | POA: Diagnosis not present

## 2020-09-11 LAB — CBC
HCT: 44.5 % (ref 39.0–52.0)
Hemoglobin: 14.9 g/dL (ref 13.0–17.0)
MCH: 30.3 pg (ref 26.0–34.0)
MCHC: 33.5 g/dL (ref 30.0–36.0)
MCV: 90.4 fL (ref 80.0–100.0)
Platelets: 320 10*3/uL (ref 150–400)
RBC: 4.92 MIL/uL (ref 4.22–5.81)
RDW: 13.2 % (ref 11.5–15.5)
WBC: 9 10*3/uL (ref 4.0–10.5)
nRBC: 0 % (ref 0.0–0.2)

## 2020-09-11 LAB — BASIC METABOLIC PANEL
Anion gap: 10 (ref 5–15)
BUN: 16 mg/dL (ref 8–23)
CO2: 22 mmol/L (ref 22–32)
Calcium: 9 mg/dL (ref 8.9–10.3)
Chloride: 103 mmol/L (ref 98–111)
Creatinine, Ser: 0.9 mg/dL (ref 0.61–1.24)
GFR, Estimated: >60 mL/min (ref >60)
Glucose, Bld: 119 mg/dL — ABNORMAL HIGH (ref 70–99)
Potassium: 4.4 mmol/L (ref 3.5–5.1)
Sodium: 135 mmol/L (ref 135–145)

## 2020-09-11 LAB — TROPONIN I (HIGH SENSITIVITY)
Troponin I (High Sensitivity): 102 ng/L (ref <18)
Troponin I (High Sensitivity): 1149 ng/L (ref <18)
Troponin I (High Sensitivity): 532 ng/L (ref <18)
Troponin I (High Sensitivity): 9214 ng/L (ref <18)

## 2020-09-11 LAB — APTT: aPTT: 29 seconds (ref 24–36)

## 2020-09-11 LAB — BRAIN NATRIURETIC PEPTIDE: B Natriuretic Peptide: 27.5 pg/mL (ref 0.0–100.0)

## 2020-09-11 LAB — PROTIME-INR
INR: 1 (ref 0.8–1.2)
Prothrombin Time: 12.3 seconds (ref 11.4–15.2)

## 2020-09-11 LAB — SARS CORONAVIRUS 2 (TAT 6-24 HRS): SARS Coronavirus 2: NEGATIVE

## 2020-09-11 MED ORDER — FLUTICASONE PROPIONATE 50 MCG/ACT NA SUSP
1.0000 | Freq: Every day | NASAL | Status: DC
  Filled 2020-09-11: qty 16

## 2020-09-11 MED ORDER — ASPIRIN EC 81 MG PO TBEC
81.0000 mg | DELAYED_RELEASE_TABLET | Freq: Every day | ORAL | Status: DC
  Administered 2020-09-13: 81 mg via ORAL
  Filled 2020-09-11: qty 1

## 2020-09-11 MED ORDER — AMLODIPINE BESYLATE 5 MG PO TABS
5.0000 mg | ORAL_TABLET | Freq: Every day | ORAL | Status: DC
  Administered 2020-09-12 – 2020-09-13 (×2): 5 mg via ORAL
  Filled 2020-09-11 (×2): qty 1

## 2020-09-11 MED ORDER — SIMVASTATIN 20 MG PO TABS
40.0000 mg | ORAL_TABLET | Freq: Every day | ORAL | Status: DC
  Administered 2020-09-12: 40 mg via ORAL
  Filled 2020-09-11: qty 2

## 2020-09-11 MED ORDER — ONDANSETRON HCL 4 MG/2ML IJ SOLN: 4.0000 mg | Freq: Three times a day (TID) | INTRAMUSCULAR | Status: DC | PRN

## 2020-09-11 MED ORDER — SODIUM CHLORIDE 0.9 % IV SOLN: INTRAVENOUS | Status: DC

## 2020-09-11 MED ORDER — GLUCOSAMINE-CHONDROITIN 500-400 MG PO CAPS: 1.0000 | ORAL_CAPSULE | Freq: Every day | ORAL | Status: DC

## 2020-09-11 MED ORDER — IPRATROPIUM-ALBUTEROL 0.5-2.5 (3) MG/3ML IN SOLN
6.0000 mL | Freq: Once | RESPIRATORY_TRACT | Status: DC
  Filled 2020-09-11: qty 3

## 2020-09-11 MED ORDER — PANTOPRAZOLE SODIUM 40 MG PO TBEC
40.0000 mg | DELAYED_RELEASE_TABLET | Freq: Every day | ORAL | Status: DC
  Administered 2020-09-12 – 2020-09-13 (×2): 40 mg via ORAL
  Filled 2020-09-11 (×2): qty 1

## 2020-09-11 MED ORDER — ASPIRIN 81 MG PO CHEW
324.0000 mg | CHEWABLE_TABLET | Freq: Once | ORAL | Status: DC
  Filled 2020-09-11: qty 4

## 2020-09-11 MED ORDER — CALCIUM CARBONATE 1250 (500 CA) MG PO TABS
1.0000 | ORAL_TABLET | Freq: Every day | ORAL | Status: DC
  Administered 2020-09-13: 500 mg via ORAL
  Filled 2020-09-11 (×3): qty 1

## 2020-09-11 MED ORDER — IRBESARTAN 150 MG PO TABS
75.0000 mg | ORAL_TABLET | Freq: Every day | ORAL | Status: DC
  Administered 2020-09-12 – 2020-09-13 (×2): 75 mg via ORAL
  Filled 2020-09-11 (×3): qty 1

## 2020-09-11 MED ORDER — HEPARIN SODIUM (PORCINE) 5000 UNIT/ML IJ SOLN
4000.0000 [IU] | Freq: Once | INTRAMUSCULAR | Status: DC
  Administered 2020-09-11: 4000 [IU] via INTRAVENOUS
  Filled 2020-09-11: qty 1

## 2020-09-11 MED ORDER — LORATADINE 10 MG PO TABS
10.0000 mg | ORAL_TABLET | Freq: Every day | ORAL | Status: DC
  Administered 2020-09-12 – 2020-09-13 (×2): 10 mg via ORAL
  Filled 2020-09-11 (×2): qty 1

## 2020-09-11 MED ORDER — DM-GUAIFENESIN ER 30-600 MG PO TB12: 1.0000 | ORAL_TABLET | Freq: Two times a day (BID) | ORAL | Status: DC | PRN

## 2020-09-11 MED ORDER — UMECLIDINIUM-VILANTEROL 62.5-25 MCG/INH IN AEPB
1.0000 | INHALATION_SPRAY | Freq: Every day | RESPIRATORY_TRACT | Status: DC
  Filled 2020-09-11: qty 14

## 2020-09-11 MED ORDER — DIPHENHYDRAMINE HCL 25 MG PO CAPS: 25.0000 mg | ORAL_CAPSULE | Freq: Two times a day (BID) | ORAL | Status: DC | PRN

## 2020-09-11 MED ORDER — METHYLPREDNISOLONE SODIUM SUCC 125 MG IJ SOLR
125.0000 mg | Freq: Once | INTRAMUSCULAR | Status: AC
  Administered 2020-09-11: 125 mg via INTRAVENOUS
  Filled 2020-09-11: qty 2

## 2020-09-11 MED ORDER — IPRATROPIUM-ALBUTEROL 0.5-2.5 (3) MG/3ML IN SOLN
3.0000 mL | RESPIRATORY_TRACT | Status: DC
  Administered 2020-09-11 – 2020-09-12 (×3): 3 mL via RESPIRATORY_TRACT
  Filled 2020-09-11 (×4): qty 3

## 2020-09-11 MED ORDER — HEPARIN (PORCINE) 25000 UT/250ML-% IV SOLN
850.0000 [IU]/h | INTRAVENOUS | Status: DC
  Administered 2020-09-11: 850 [IU]/h via INTRAVENOUS
  Filled 2020-09-11: qty 250

## 2020-09-11 MED ORDER — NITROGLYCERIN 0.4 MG SL SUBL
0.4000 mg | SUBLINGUAL_TABLET | SUBLINGUAL | Status: DC | PRN
  Administered 2020-09-11: 0.4 mg via SUBLINGUAL
  Filled 2020-09-11: qty 1

## 2020-09-11 MED ORDER — MORPHINE SULFATE (PF) 2 MG/ML IV SOLN
1.0000 mg | INTRAVENOUS | Status: DC | PRN
  Administered 2020-09-11 – 2020-09-12 (×3): 1 mg via INTRAVENOUS
  Filled 2020-09-11 (×3): qty 1

## 2020-09-11 MED ORDER — ALBUTEROL SULFATE (2.5 MG/3ML) 0.083% IN NEBU
2.5000 mg | INHALATION_SOLUTION | RESPIRATORY_TRACT | Status: DC | PRN
  Filled 2020-09-11: qty 3

## 2020-09-11 MED ORDER — MORPHINE SULFATE (PF) 2 MG/ML IV SOLN
2.0000 mg | Freq: Once | INTRAVENOUS | Status: AC
  Administered 2020-09-11: 2 mg via INTRAVENOUS
  Filled 2020-09-11: qty 1

## 2020-09-11 MED ORDER — IPRATROPIUM-ALBUTEROL 0.5-2.5 (3) MG/3ML IN SOLN
RESPIRATORY_TRACT | Status: AC
  Administered 2020-09-11: 6 mL via RESPIRATORY_TRACT
  Filled 2020-09-11: qty 6

## 2020-09-11 MED ORDER — ACETAMINOPHEN 325 MG PO TABS
650.0000 mg | ORAL_TABLET | Freq: Four times a day (QID) | ORAL | Status: DC | PRN
  Administered 2020-09-11: 650 mg via ORAL
  Filled 2020-09-11: qty 2

## 2020-09-11 MED ORDER — CINNAMON 500 MG PO CAPS: 1200.0000 mg | ORAL_CAPSULE | Freq: Two times a day (BID) | ORAL | Status: DC

## 2020-09-11 MED ORDER — HEPARIN BOLUS VIA INFUSION
4000.0000 [IU] | Freq: Once | INTRAVENOUS | Status: DC
  Filled 2020-09-11: qty 4000

## 2020-09-11 MED ORDER — HYDRALAZINE HCL 20 MG/ML IJ SOLN: 5.0000 mg | INTRAMUSCULAR | Status: DC | PRN

## 2020-09-11 MED ORDER — OMEGA-3-ACID ETHYL ESTERS 1 G PO CAPS
1.0000 g | ORAL_CAPSULE | Freq: Every day | ORAL | Status: DC
  Administered 2020-09-12 – 2020-09-13 (×2): 1 g via ORAL
  Filled 2020-09-11 (×2): qty 1

## 2020-09-11 NOTE — ED Triage Notes (Signed)
See first RN Note, pt states pain started at 0930 while patient was walking for exercise. Pt to triage holding L side of his chest. Pt able to speak in full and complete sentences in triage.

## 2020-09-11 NOTE — ED Notes (Signed)
Patient and wife inquiring about heparin and troponin draws and the reasoning behind them. This RN educated patient about NSTEMI listed in admission disposition. Patient and wife stated "we were never told that he had a heart attack, only that it was a possible heart attack per the EDP". The patient stated that when the day shift attending had come to talk to them about admission "he just brushed off questions about diagnosis and questions about lab work" patient states that he "was unaware of increasing troponin".

## 2020-09-11 NOTE — ED Provider Notes (Signed)
[facility] Emergency Department Provider Note ____________________________________________   Event Date/Time   First MD Initiated Contact with Patient 09/11/20 1220     (approximate)  I have reviewed the triage vital signs and the nursing notes.  HISTORY  Chief Complaint Chest Pain and Shortness of Breath   HPI James Peck is a 80 y.o. malewho presents to the ED for evaluation of chest pain and shortness of breath.  Chart review indicates history of HTN, HLD, GERD and CAD. Former smoker with 40-pack-year history with COPD followed by pulmonology. 2018 visits with [facility] clinic cardiology, Dr. Ubaldo Glassing.  PCI to the RCA in 1999, LAD in 2014  Patient presents to the ED for evaluation of chest pain and shortness of breath that started this morning at 0930 while he was doing his daily walk.  He reports feeling normal this morning and last night without recent illnesses.  Reports continued left-sided chest pain, 6-10 intensity, with associated dyspnea on exertion and minimally productive cough.   Denies any syncope, emesis, abdominal pain, trauma or falls, headache, palpitations.    Past Medical History:  Diagnosis Date  . Allergy   . Arthritis   . Coronary artery disease   . GERD (gastroesophageal reflux disease)   . History of hiatal hernia   . Hypercholesteremia   . Hypertension     Patient Active Problem List   Diagnosis Date Noted  . Exposure to confirmed case of COVID-19 07/26/2020  . Allergic rhinitis 04/12/2020  . Emphysema, unspecified ([place]) 04/12/2020  . Former smoker 04/12/2020  . Lung nodules 03/27/2020  . Renal lesion 03/27/2020  . Abnormal abdominal CT scan 02/16/2020  . Left carotid bruit 11/10/2019  . Right rotator cuff tear 08/19/2019  . AC (acromioclavicular) arthritis 07/08/2019  . Cough 06/30/2019  . Healthcare maintenance 08/28/2018  . Leg cramps 04/27/2018  . Hyperglycemia 12/22/2017  . Degenerative arthritis of left knee  06/04/2017  . 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 Acid reflux 01/27/2015  . Adenomatous colon polyp 01/27/2015  . Allergic state 01/27/2015  . Arteriosclerosis of coronary artery 01/27/2015  . Lumbar radiculopathy 01/27/2015  . CAD in native artery 01/07/2014  . Benign essential HTN 10/21/2013  . HLD (hyperlipidemia) 10/21/2013    Past Surgical History:  Procedure Laterality Date  . CARDIAC CATHETERIZATION    . cardiac stents    . CATARACT EXTRACTION W/PHACO Right 09/10/2017   Procedure: CATARACT EXTRACTION PHACO AND INTRAOCULAR LENS PLACEMENT (IOC);  Surgeon: Birder Robson, MD;  Location: [facility] ORS;  Service: Ophthalmology;  Laterality: Right;  [place] 01:01.7 AP% 15.3 CDE 9.38 Fluid pack lot # [id number]  . CATARACT EXTRACTION W/PHACO Left 10/02/2017   Procedure: CATARACT EXTRACTION PHACO AND INTRAOCULAR LENS PLACEMENT (IOC);  Surgeon: Birder Robson, MD;  Location: [facility] ORS;  Service: Ophthalmology;  Laterality: Left;  [place] 00:42.3 AP% 16.2 CDE 6.85 Fluid Pack Lot # [id number]  . COLONOSCOPY WITH PROPOFOL N/A 06/28/2017   Procedure: COLONOSCOPY WITH PROPOFOL;  Surgeon: Manya Silvas, MD;  Location: [facility] ENDOSCOPY;  Service: Endoscopy;  Laterality: N/A;  . CORONARY ANGIOPLASTY     STENTS X 2  . KNEE ARTHROSCOPY      Prior to Admission medications   Medication Sig Start Date End Date Taking? Authorizing Provider  albuterol (VENTOLIN HFA) 108 (90 Base) MCG/ACT inhaler Inhale 1-2 puffs into the lungs every 6 (six) hours as needed for wheezing or shortness of breath.    [provider]  amLODipine (NORVASC) 5  MG tablet Take 1 tablet (5 mg total) by mouth daily. 03/17/20   Einar Pheasant, MD  ANORO ELLIPTA 62.5-25 MCG/INH AEPB Inhale 1 puff into the lungs daily. 06/21/20   Lauraine Rinne, NP  aspirin EC 81 MG tablet Take 81 mg by mouth daily.     [provider]  benzonatate (TESSALON) 200 MG capsule Take 200 mg by mouth 3 (three) times daily as  needed. 07/12/20   [provider]  calcium carbonate (OS-CAL - DOSED IN MG OF ELEMENTAL CALCIUM) 1250 (500 CA) MG tablet Take 1 tablet by mouth daily.     [provider]  cetirizine (ZYRTEC) 10 MG tablet TAKE 1 TABLET BY MOUTH DAILY 08/24/20   Einar Pheasant, MD  CINNAMON PO Take 1,200 mg by mouth 2 times daily at 12 noon and 4 pm.    [provider]  Dextromethorphan-guaiFENesin [facility] DM PO) Take by mouth daily.    [provider]  diclofenac sodium (VOLTAREN) 1 % GEL Apply 2 g 2 (two) times daily topically. To affected joint. 06/04/17   Lyndal Pulley, DO  fluticasone (FLONASE) 50 MCG/ACT nasal spray TAKE 2 SPRAYS INTO BOTH NOSTRILS DAILY 03/07/20   Einar Pheasant, MD  GLUCOSAMINE-CHONDROITIN PO Take 1 capsule by mouth daily.    [provider]  Misc Natural Products (BLACK CHERRY CONCENTRATE PO) Take 1,000 mg daily by mouth.    [provider]  Omega-3 Fatty Acids (FISH OIL) 1000 MG CAPS Take 1,000 mg by mouth daily.     [provider]  omeprazole (PRILOSEC) 20 MG capsule Take 1 capsule (20 mg total) by mouth daily. 03/17/20   Einar Pheasant, MD  sildenafil (REVATIO) 20 MG tablet TAKE 1 TO 2 TABLETS BY MOUTH EVERY DAY AS NEEDED 06/13/20   Einar Pheasant, MD  simvastatin (ZOCOR) 40 MG tablet Take 1 tablet (40 mg total) by mouth daily. 03/17/20   Einar Pheasant, MD  telmisartan (MICARDIS) 20 MG tablet TAKE ONE TABLET EVERY DAY 04/06/20   Einar Pheasant, MD    Allergies Ace inhibitors  Family History  Problem Relation Age of Onset  . Heart disease Mother   . Diabetes Mother   . Heart disease Father   . Diabetes Father   . Alzheimer's disease Brother   . Stroke Brother     Social History Social History   Tobacco Use  . Smoking status: Former Smoker    Packs/day: 1.00    Years: 40.00    Pack years: 40.00    Types: Cigarettes    Quit date: 2000    Years since quitting: 22.1  . Smokeless tobacco: Never Used   Vaping Use  . Vaping Use: Never used  Substance Use Topics  . Alcohol use: No  . Drug use: No    Review of Systems  Constitutional: No fever/chills Eyes: No visual changes. ENT: No sore throat. Cardiovascular: Positive chest pain Respiratory: Positive dyspnea on exertion. Gastrointestinal: No abdominal pain.  No nausea, no vomiting.  No diarrhea.  No constipation. Genitourinary: Negative for dysuria. Musculoskeletal: Negative for back pain. Skin: Negative for rash. Neurological: Negative for headaches, focal weakness or numbness.  ____________________________________________   PHYSICAL EXAM:  VITAL SIGNS: Vitals:   09/11/20 1330 09/11/20 1400  BP: 131/73   Pulse: 74 (!) 58  Resp: 11 15  Temp:    SpO2: 96% 99%     Constitutional: Alert and oriented. Well appearing and in no acute distress.  Pleasant and conversational in full sentences.  Eyes: Conjunctivae are normal. PERRL. EOMI. Head: Atraumatic. Nose: No congestion/rhinnorhea. Mouth/Throat: Mucous membranes are moist.  Oropharynx non-erythematous. Neck: No stridor. No cervical spine tenderness to palpation. Cardiovascular: Normal rate, regular rhythm. Grossly normal heart sounds.  Good peripheral circulation. Respiratory: Mild dyspnea and tachypnea with respiratory rate in the mid 20s.  Diffuse expiratory wheezes and slight decreased air movement throughout.  No focal features. Gastrointestinal: Soft , nondistended, nontender to palpation. No CVA tenderness. Musculoskeletal: No lower extremity tenderness nor edema.  No joint effusions. No signs of acute trauma. Neurologic:  Normal speech and language. No gross focal neurologic deficits are appreciated. No gait instability noted. Cranial nerves II through XII intact 5/5 strength and sensation in all 4 extremities Skin:  Skin is warm, dry and intact. No rash noted. Psychiatric: Mood and affect are normal. Speech and behavior are  normal.  ____________________________________________   LABS (all labs ordered are listed, but only abnormal results are displayed)  Labs Reviewed  BASIC METABOLIC PANEL - Abnormal; Notable for the following components:      Result Value   Glucose, Bld 119 (*)    All other components within normal limits  TROPONIN I (HIGH SENSITIVITY) - Abnormal; Notable for the following components:   Troponin I (High Sensitivity) 102 (*)    All other components within normal limits  TROPONIN I (HIGH SENSITIVITY) - Abnormal; Notable for the following components:   Troponin I (High Sensitivity) 532 (*)    All other components within normal limits  SARS CORONAVIRUS 2 (TAT 6-24 HRS)  CBC  APTT  PROTIME-INR   ____________________________________________  12 Lead EKG  Sinus rhythm, rate of 54 bpm.  Normal axis and intervals.  Sub-1 mm ST depressions to leads V3 and V4 without STEMI criteria.  New when compared to EKG from 02/2020. ____________________________________________  RADIOLOGY  ED MD interpretation: 2 view CXR reviewed by me without evidence of acute cardiopulmonary pathology.  Official radiology report(s): DG Chest 2 View  Result Date: 09/11/2020 CLINICAL DATA:  Chest pain and shortness of breath EXAM: CHEST - 2 VIEW COMPARISON:  March 06, 2020 FINDINGS: Lungs are clear. Heart size and pulmonary vascularity are normal. No adenopathy. There is aortic atherosclerosis. There is mild degenerative change in the thoracic spine. IMPRESSION: Lungs clear. Heart size normal. Aortic Atherosclerosis (ICD10-I70.0). Electronically Signed   By: Lowella Grip III M.D.   On: 09/11/2020 12:09    ____________________________________________   PROCEDURES and INTERVENTIONS  Procedure(s) performed (including Critical Care):  .1-3 Lead EKG Interpretation Performed by: Vladimir Crofts, MD Authorized by: Vladimir Crofts, MD     Interpretation: normal     ECG rate:  64   ECG rate assessment: normal      Rhythm: sinus rhythm     Ectopy: none     Conduction: normal   .Critical Care Performed by: Vladimir Crofts, MD Authorized by: Vladimir Crofts, MD   Critical care provider statement:    Critical care time (minutes):  35   Critical care was necessary to treat or prevent imminent or life-threatening deterioration of the following conditions:  Cardiac failure   Critical care was time spent personally by me on the following activities:  Discussions with consultants, evaluation of patient's response to treatment, examination of patient, ordering and performing treatments and interventions, ordering and review of laboratory studies, ordering and review of radiographic studies, pulse oximetry, re-evaluation of patient's condition, obtaining history from patient or surrogate and review of old charts    Medications  ipratropium-albuterol (DUONEB) 0.5-2.5 (  3) MG/3ML nebulizer solution 6 mL (6 mLs Nebulization Not Given 09/11/20 1257)  aspirin chewable tablet 324 mg (324 mg Oral Not Given 09/11/20 1257)  nitroGLYCERIN (NITROSTAT) SL tablet 0.4 mg (0.4 mg Sublingual Given 09/11/20 1305)  heparin bolus via infusion 4,000 Units (has no administration in time range)  heparin ADULT infusion 100 units/mL (25000 units/262mL) (has no administration in time range)  methylPREDNISolone sodium succinate (SOLU-MEDROL) 125 mg/2 mL injection 125 mg (125 mg Intravenous Given 09/11/20 1256)  ipratropium-albuterol (DUONEB) 0.5-2.5 (3) MG/3ML nebulizer solution (6 mLs Nebulization Given 09/11/20 1319)  morphine 2 MG/ML injection 2 mg (2 mg Intravenous Given 09/11/20 1405)    ____________________________________________   MDM / ED COURSE   80 year old male with known COPD and CAD presents to the ED with exertional chest pain shortness of breath consistent with COPD exacerbation and NSTEMI requiring heparinization and medical admission.  Hemodynamically stable and not hypoxic.  Exam with stigmata of COPD exacerbation, but no  distress, neurovascular deficits or any signs of acute trauma.  EKG demonstrates ST depressions to his septal leads that appear new, but no STEMI criteria.  First troponin is elevated to 100 and blood work is otherwise unremarkable.  Provide treatments for COPD exacerbation, with improvements of his respiratory status and chest pain.  Repeat troponin returns quite increased in the 500s, concerning for ongoing NSTEMI.  Repeat EKG unchanged.  We will load with heparin and initiate anticoagulation.  We will discussed the case with hospitalist medicine for further work-up and management of his cardiopulmonary conditions.   Clinical Course as of 09/11/20 1458  Sun Sep 11, 2020  1456 Reassessed.  Patient reports improving pain.  Wife is now at the bedside and provides additional history.  We discussed NSTEMI and medical admission.  We discussed heparinization.  He is agreeable. [DS]    Clinical Course User Index [DS] Vladimir Crofts, MD    ____________________________________________   FINAL CLINICAL IMPRESSION(S) / ED DIAGNOSES  Final diagnoses:  NSTEMI (non-ST elevated myocardial infarction) ([place])  COPD exacerbation [facility])     ED Discharge Orders    None       Rosalita Carey   Note:  This document was prepared using Dragon voice recognition software and may include unintentional dictation errors.   Vladimir Crofts, MD 09/11/20 1515

## 2020-09-11 NOTE — ED Notes (Signed)
Md Tamala Julian notified of critical Troponin 102.

## 2020-09-11 NOTE — ED Notes (Signed)
All overdue meds at this time are not yet verified by pharmacy.

## 2020-09-11 NOTE — ED Notes (Addendum)
Pt complains of chest discomfort states 6/10 given sublingual Nitro with minimal relief.

## 2020-09-11 NOTE — H&P (Signed)
History and Physical    James Peck:[id number] DOB: [birth year] DOA: 09/11/2020  Referring MD/NP/PA:   PCP: Einar Pheasant, MD   Patient coming from:  The patient is coming from home.  At baseline, pt is independent for most of ADL.        Chief Complaint: chest pain  HPI: James Peck is a 80 y.o. male with medical history significant of CAD with stents, HTN, HLD, COPD, former smoker, GERD, presents with CP.  Pt states that his chest pain started at about 9:30 AM while he was walking for exercise. The chest pain is located in the front chest, pressure-like, 6 out of 10 severity, nonradiating.  Associated with shortness of breath.  He has chronic cough due to COPD for which is no change.  No fever or chills.  Patient does not have nausea vomiting, diarrhea, abdominal pain, symptoms of UTI.  No recent fall or head injury.  No rectal bleeding or dark stool.  ED Course: pt was found to have Trop 102 --> 532, WBC 9.0, pending COVID-19 PCR, electrolytes renal function okay, temperature normal, blood pressure 131/73, heart rate 58, RR 22, oxygen saturation 92% on room air, which improved with 99% on 3 L oxygen.  Chest x-ray negative.  Patient is admitted to [place] bed as inpatient   Review of Systems:   General: no fevers, chills, no body weight gain, has fatigue HEENT: no blurry vision, hearing changes or sore throat Respiratory: has dyspnea, coughing CV: has chest pain, no palpitations GI: no nausea, vomiting, abdominal pain, diarrhea, constipation GU: no dysuria, burning on urination, increased urinary frequency, hematuria  Ext: no leg edema Neuro: no unilateral weakness, numbness, or tingling, no vision change or hearing loss Skin: no rash, no skin tear. MSK: No muscle spasm, no deformity, no limitation of range of movement in spin Heme: No easy bruising.  Travel history: No recent long distant travel.  Allergy:  Allergies  Allergen Reactions  . Ace Inhibitors     Cough     Past Medical History:  Diagnosis Date  . Allergy   . Arthritis   . Coronary artery disease   . GERD (gastroesophageal reflux disease)   . History of hiatal hernia   . Hypercholesteremia   . Hypertension     Past Surgical History:  Procedure Laterality Date  . CARDIAC CATHETERIZATION    . cardiac stents    . CATARACT EXTRACTION W/PHACO Right 09/10/2017   Procedure: CATARACT EXTRACTION PHACO AND INTRAOCULAR LENS PLACEMENT (IOC);  Surgeon: Birder Robson, MD;  Location: [facility] ORS;  Service: Ophthalmology;  Laterality: Right;  [place] 01:01.7 AP% 15.3 CDE 9.38 Fluid pack lot # [id number]  . CATARACT EXTRACTION W/PHACO Left 10/02/2017   Procedure: CATARACT EXTRACTION PHACO AND INTRAOCULAR LENS PLACEMENT (IOC);  Surgeon: Birder Robson, MD;  Location: [facility] ORS;  Service: Ophthalmology;  Laterality: Left;  [place] 00:42.3 AP% 16.2 CDE 6.85 Fluid Pack Lot # [id number]  . COLONOSCOPY WITH PROPOFOL N/A 06/28/2017   Procedure: COLONOSCOPY WITH PROPOFOL;  Surgeon: Manya Silvas, MD;  Location: [facility] ENDOSCOPY;  Service: Endoscopy;  Laterality: N/A;  . CORONARY ANGIOPLASTY     STENTS X 2  . KNEE ARTHROSCOPY      Social History:  reports that he quit smoking about 22 years ago. His smoking use included cigarettes. He has a 40.00 pack-year smoking history. He has never used smokeless tobacco. He reports that he does not drink alcohol and does not use drugs.  Family History:  Family History  Problem Relation Age of Onset  . Heart disease Mother   . Diabetes Mother   . Heart disease Father   . Diabetes Father   . Alzheimer's disease Brother   . Stroke Brother      Prior to Admission medications   Medication Sig Start Date End Date Taking? Authorizing Provider  albuterol (VENTOLIN HFA) 108 (90 Base) MCG/ACT inhaler Inhale 1-2 puffs into the lungs every 6 (six) hours as needed for wheezing or shortness of breath.    [provider]  amLODipine (NORVASC) 5 MG tablet Take 1 tablet (5  mg total) by mouth daily. 03/17/20   Einar Pheasant, MD  ANORO ELLIPTA 62.5-25 MCG/INH AEPB Inhale 1 puff into the lungs daily. 06/21/20   Lauraine Rinne, NP  aspirin EC 81 MG tablet Take 81 mg by mouth daily.     [provider]  benzonatate (TESSALON) 200 MG capsule Take 200 mg by mouth 3 (three) times daily as needed. 07/12/20   [provider]  calcium carbonate (OS-CAL - DOSED IN MG OF ELEMENTAL CALCIUM) 1250 (500 CA) MG tablet Take 1 tablet by mouth daily.     [provider]  cetirizine (ZYRTEC) 10 MG tablet TAKE 1 TABLET BY MOUTH DAILY 08/24/20   Einar Pheasant, MD  CINNAMON PO Take 1,200 mg by mouth 2 times daily at 12 noon and 4 pm.    [provider]  Dextromethorphan-guaiFENesin [facility] DM PO) Take by mouth daily.    [provider]  diclofenac sodium (VOLTAREN) 1 % GEL Apply 2 g 2 (two) times daily topically. To affected joint. 06/04/17   Lyndal Pulley, DO  fluticasone (FLONASE) 50 MCG/ACT nasal spray TAKE 2 SPRAYS INTO BOTH NOSTRILS DAILY 03/07/20   Einar Pheasant, MD  GLUCOSAMINE-CHONDROITIN PO Take 1 capsule by mouth daily.    [provider]  Misc Natural Products (BLACK CHERRY CONCENTRATE PO) Take 1,000 mg daily by mouth.    [provider]  Omega-3 Fatty Acids (FISH OIL) 1000 MG CAPS Take 1,000 mg by mouth daily.     [provider]  omeprazole (PRILOSEC) 20 MG capsule Take 1 capsule (20 mg total) by mouth daily. 03/17/20   Einar Pheasant, MD  sildenafil (REVATIO) 20 MG tablet TAKE 1 TO 2 TABLETS BY MOUTH EVERY DAY AS NEEDED 06/13/20   Einar Pheasant, MD  simvastatin (ZOCOR) 40 MG tablet Take 1 tablet (40 mg total) by mouth daily. 03/17/20   Einar Pheasant, MD  telmisartan (MICARDIS) 20 MG tablet TAKE ONE TABLET EVERY DAY 04/06/20   Einar Pheasant, MD    Physical Exam: Vitals:   09/11/20 1330 09/11/20 1400 09/11/20 1500 09/11/20 1530  BP: 131/73  (!) 124/54 (!) 151/62  Pulse: 74 (!) 58 64 62  Resp: 11  15 14 17   Temp:      TempSrc:      SpO2: 96% 99% 97% 97%  Weight:      Height:       General: Not in acute distress HEENT:       Eyes: PERRL, EOMI, no scleral icterus.       ENT: No discharge from the ears and nose, no pharynx injection, no tonsillar enlargement.        Neck: No JVD, no bruit, no mass felt. Heme: No neck lymph node enlargement. Cardiac: S1/S2, RRR, No murmurs, No gallops or rubs. Respiratory: has rhonchi bilaterally GI: Soft, nondistended, nontender, no rebound pain, no organomegaly, BS present. GU:  No hematuria Ext: No pitting leg edema bilaterally. 1+DP/PT pulse bilaterally. Musculoskeletal: No joint deformities, No joint redness or warmth, no limitation of ROM in spin. Skin: No rashes.  Neuro: Alert, oriented X3, cranial nerves II-XII grossly intact, moves all extremities normally. Psych: Patient is not psychotic, no suicidal or hemocidal ideation.  Labs on Admission: I have personally reviewed following labs and imaging studies  CBC: Recent Labs  Lab 09/11/20 1140  WBC 9.0  HGB 14.9  HCT 44.5  MCV 90.4  PLT 824   Basic Metabolic Panel: Recent Labs  Lab 09/11/20 1140  NA 135  K 4.4  CL 103  CO2 22  GLUCOSE 119*  BUN 16  CREATININE 0.90  CALCIUM 9.0   GFR: Estimated Creatinine Clearance: 60.1 mL/min (by C-G formula based on SCr of 0.9 mg/dL). Liver Function Tests: No results for input(s): AST, ALT, ALKPHOS, BILITOT, PROT, ALBUMIN in the last 168 hours. No results for input(s): LIPASE, AMYLASE in the last 168 hours. No results for input(s): AMMONIA in the last 168 hours. Coagulation Profile: Recent Labs  Lab 09/11/20 1343  INR 1.0   Cardiac Enzymes: No results for input(s): CKTOTAL, CKMB, CKMBINDEX, TROPONINI in the last 168 hours. BNP (last 3 results) No results for input(s): PROBNP in the last 8760 hours. HbA1C: No results for input(s): HGBA1C in the last 72 hours. CBG: No results for input(s): GLUCAP in the last 168 hours. Lipid  Profile: No results for input(s): CHOL, HDL, LDLCALC, TRIG, CHOLHDL, LDLDIRECT in the last 72 hours. Thyroid Function Tests: No results for input(s): TSH, T4TOTAL, FREET4, T3FREE, THYROIDAB in the last 72 hours. Anemia Panel: No results for input(s): VITAMINB12, FOLATE, FERRITIN, TIBC, IRON, RETICCTPCT in the last 72 hours. Urine analysis:    Component Value Date/Time   APPEARANCEUR Clear 08/11/2020 1526   GLUCOSEU Negative 08/11/2020 1526   BILIRUBINUR Negative 08/11/2020 1526   PROTEINUR Negative 08/11/2020 1526   NITRITE Negative 08/11/2020 1526   LEUKOCYTESUR Negative 08/11/2020 1526   Sepsis Labs: @LABRCNTIP (procalcitonin:4,lacticidven:4) )No results found for this or any previous visit (from the past 240 hour(s)).   Radiological Exams on Admission: DG Chest 2 View  Result Date: 09/11/2020 CLINICAL DATA:  Chest pain and shortness of breath EXAM: CHEST - 2 VIEW COMPARISON:  March 06, 2020 FINDINGS: Lungs are clear. Heart size and pulmonary vascularity are normal. No adenopathy. There is aortic atherosclerosis. There is mild degenerative change in the thoracic spine. IMPRESSION: Lungs clear. Heart size normal. Aortic Atherosclerosis (ICD10-I70.0). Electronically Signed   By: Lowella Grip III M.D.   On: 09/11/2020 12:09     EKG: I have personally reviewed.  Sinus rhythm, QTC 407, bradycardia with heart rate of 54, low voltage, ST depression in V2-V4.  Assessment/Plan Principal Problem:   NSTEMI (non-ST elevated myocardial infarction) ([place]) Active Problems:   Acid reflux   Benign essential HTN   HLD (hyperlipidemia)   Coronary artery disease   Chest pain   COPD exacerbation (HCC)   Chest pain, hx of of CAD and NSTEMI: s/p of stent placement. Trop 102 -->532.  His ST depression in V2-V4. Dr. Ubaldo Glassing is consulted.  - admit to progressive unit as inpatient - Trend Trop - IV heparin - Repeat EKG in the am  - prn Nitroglycerin, Morphine, and aspirin, zocor - Risk factor  stratification: will check FLP and A1C   Acid reflux -Protonix  Benign essential HTN -Irbesartan, amlodipine -IV hydralazine as needed  HLD (hyperlipidemia) -Zocor  COPD exacerbation [facility]): Patient has shortness  breath, dry cough, has rhonchi on auscultation, indicating COPD exacerbation.  Oxygen saturation 92% on room air, which improved to 99% on 3 L oxygen. -Bronchodilators -Mucinex for cough -Nasal cannula oxygen to maintain oxygen saturation above 93% -Patient received 125 mg of Solu-Medrol in ED   DVT ppx: on IV Heparin   Code Status: Full code Family Communication:  Yes, patient's  Wife at bed side Disposition Plan:  Anticipate discharge back to previous environment Consults called:  Dr. Ubaldo Glassing of card Admission status and Level of care: Progressive Cardiac:  as inpt       Status is: Inpatient  Remains inpatient appropriate because:Inpatient level of care appropriate due to severity of illness   Dispo: The patient is from: Home              Anticipated d/c is to: Home              Anticipated d/c date is: 2 days              Patient currently is not medically stable to d/c.   Difficult to place patient No         Date of Service 09/11/2020    [facility]   If 7PM-7AM, please contact night-coverage [email or web address] 09/11/2020, 4:06 PM

## 2020-09-11 NOTE — ED Triage Notes (Signed)
Pt in via EMS from home with c/o sudden onset of CP while walking and SOB. No hx of the same. Pain is described as achy and all across chest. Pain subsided but is now coming back 324 mg of aspirin taken at home, 172/83, 98% RA, IV to left hand, 18G, pt getting fluids currenlty

## 2020-09-11 NOTE — ED Notes (Signed)
Pt placed on 3l nasal cannula for comfort

## 2020-09-11 NOTE — ED Notes (Addendum)
Admitting Md Blaine Hamper notified of elevated troponin .

## 2020-09-11 NOTE — Consult Note (Signed)
ANTICOAGULATION CONSULT NOTE - Initial Consult  Pharmacy Consult for heparin Indication: chest pain/ACS  Allergies  Allergen Reactions  . Ace Inhibitors     Cough    Patient Measurements: Height: 5\' 6"  (167.6 cm) Weight: 70.3 kg (155 lb) IBW/kg (Calculated) : 63.8 Heparin Dosing Weight: 70.3 kg  Vital Signs: Temp: 97.6 F (36.4 C) (02/13 1136) Temp Source: Oral (02/13 1136) BP: 131/73 (02/13 1330) Pulse Rate: 58 (02/13 1400)  Labs: Recent Labs    09/11/20 1140 09/11/20 1343  HGB 14.9  --   HCT 44.5  --   PLT 320  --   CREATININE 0.90  --   TROPONINIHS 102* 532*    Estimated Creatinine Clearance: 60.1 mL/min (by C-G formula based on SCr of 0.9 mg/dL).   Medical History: Past Medical History:  Diagnosis Date  . Allergy   . Arthritis   . Coronary artery disease   . GERD (gastroesophageal reflux disease)   . History of hiatal hernia   . Hypercholesteremia   . Hypertension     Medications:  No PTA APT or AC No AC allergies   Assessment: James Peck is a 80 y.o. male who presents to the ED for evaluation of chest pain and shortness of breath. Pharmacy has been consulted for heparin dosing for ACS/NSTEMI.  H/H and plts WNL Heparin Dosing Weight: 70.3 kg  Goal of Therapy:  Heparin level 0.3-0.7 units/ml Monitor platelets by anticoagulation protocol: Yes   Plan:  Give 4000 units bolus x 1 Start heparin infusion at 850 units/hr Check anti-Xa level in 8 hours and daily while on heparin Continue to monitor H&H and platelets  Darnelle Bos, PharmD Clinical Pharmacist 09/11/2020,2:51 PM

## 2020-09-12 ENCOUNTER — Other Ambulatory Visit: Payer: PPO

## 2020-09-12 ENCOUNTER — Encounter: Payer: Self-pay | Admitting: Cardiology

## 2020-09-12 ENCOUNTER — Inpatient Hospital Stay
Admit: 2020-09-12 | Discharge: 2020-09-12 | Disposition: A | Discharge status: Home / Self Care | Payer: PPO | Attending: Internal Medicine | Admitting: Internal Medicine

## 2020-09-12 ENCOUNTER — Inpatient Hospital Stay: Admit: 2020-09-12 | Payer: PPO

## 2020-09-12 ENCOUNTER — Encounter
Admission: EM | Disposition: A | Discharge status: Home / Self Care | Payer: Self-pay | Source: Home / Self Care | Attending: Internal Medicine

## 2020-09-12 HISTORY — PX: LEFT HEART CATH AND CORONARY ANGIOGRAPHY: CATH118249

## 2020-09-12 LAB — CBC
HCT: 42.4 % (ref 39.0–52.0)
Hemoglobin: 14.8 g/dL (ref 13.0–17.0)
MCH: 31.1 pg (ref 26.0–34.0)
MCHC: 34.9 g/dL (ref 30.0–36.0)
MCV: 89.1 fL (ref 80.0–100.0)
Platelets: 304 10*3/uL (ref 150–400)
RBC: 4.76 MIL/uL (ref 4.22–5.81)
RDW: 13.4 % (ref 11.5–15.5)
WBC: 14.1 10*3/uL — ABNORMAL HIGH (ref 4.0–10.5)
nRBC: 0 % (ref 0.0–0.2)

## 2020-09-12 LAB — LIPID PANEL
Cholesterol: 157 mg/dL (ref 0–200)
HDL: 54 mg/dL (ref >40)
LDL Cholesterol: 93 mg/dL (ref 0–99)
Total CHOL/HDL Ratio: 2.9 RATIO
Triglycerides: 49 mg/dL (ref <150)
VLDL: 10 mg/dL (ref 0–40)

## 2020-09-12 LAB — BASIC METABOLIC PANEL
Anion gap: 11 (ref 5–15)
BUN: 11 mg/dL (ref 8–23)
CO2: 22 mmol/L (ref 22–32)
Calcium: 8.9 mg/dL (ref 8.9–10.3)
Chloride: 104 mmol/L (ref 98–111)
Creatinine, Ser: 0.81 mg/dL (ref 0.61–1.24)
GFR, Estimated: >60 mL/min (ref >60)
Glucose, Bld: 146 mg/dL — ABNORMAL HIGH (ref 70–99)
Potassium: 4.2 mmol/L (ref 3.5–5.1)
Sodium: 137 mmol/L (ref 135–145)

## 2020-09-12 LAB — TROPONIN I (HIGH SENSITIVITY): Troponin I (High Sensitivity): 13605 ng/L (ref <18)

## 2020-09-12 LAB — HEPARIN LEVEL (UNFRACTIONATED): Heparin Unfractionated: 0.57 IU/mL (ref 0.30–0.70)

## 2020-09-12 SURGERY — LEFT HEART CATH AND CORONARY ANGIOGRAPHY
Anesthesia: Moderate Sedation

## 2020-09-12 MED ORDER — SODIUM CHLORIDE 0.9 % WEIGHT BASED INFUSION: 1.0000 mL/kg/h | INTRAVENOUS | Status: DC

## 2020-09-12 MED ORDER — LIDOCAINE HCL (PF) 1 % IJ SOLN
INTRAMUSCULAR | Status: DC | PRN
  Administered 2020-09-12: 3 mL

## 2020-09-12 MED ORDER — HYDRALAZINE HCL 20 MG/ML IJ SOLN: 10.0000 mg | INTRAMUSCULAR | Status: AC | PRN

## 2020-09-12 MED ORDER — ASPIRIN 81 MG PO CHEW: 81.0000 mg | CHEWABLE_TABLET | ORAL | Status: AC

## 2020-09-12 MED ORDER — HEPARIN SODIUM (PORCINE) 1000 UNIT/ML IJ SOLN
INTRAMUSCULAR | Status: AC
  Filled 2020-09-12: qty 1

## 2020-09-12 MED ORDER — SODIUM CHLORIDE 0.9 % WEIGHT BASED INFUSION: 3.0000 mL/kg/h | INTRAVENOUS | Status: DC

## 2020-09-12 MED ORDER — VERAPAMIL HCL 2.5 MG/ML IV SOLN
INTRAVENOUS | Status: DC | PRN
  Administered 2020-09-12: 2.5 mg via INTRAVENOUS

## 2020-09-12 MED ORDER — HEPARIN (PORCINE) IN NACL 1000-0.9 UT/500ML-% IV SOLN
INTRAVENOUS | Status: AC
  Filled 2020-09-12: qty 1000

## 2020-09-12 MED ORDER — FENTANYL CITRATE (PF) 100 MCG/2ML IJ SOLN
INTRAMUSCULAR | Status: AC
  Filled 2020-09-12: qty 2

## 2020-09-12 MED ORDER — IOHEXOL 300 MG/ML  SOLN
INTRAMUSCULAR | Status: DC | PRN
  Administered 2020-09-12: 65 mL

## 2020-09-12 MED ORDER — MIDAZOLAM HCL 2 MG/2ML IJ SOLN
INTRAMUSCULAR | Status: AC
  Filled 2020-09-12: qty 2

## 2020-09-12 MED ORDER — SODIUM CHLORIDE 0.9% FLUSH
3.0000 mL | Freq: Two times a day (BID) | INTRAVENOUS | Status: DC
  Administered 2020-09-12 – 2020-09-13 (×2): 3 mL via INTRAVENOUS

## 2020-09-12 MED ORDER — SODIUM CHLORIDE 0.9% FLUSH: 3.0000 mL | INTRAVENOUS | Status: DC | PRN

## 2020-09-12 MED ORDER — VERAPAMIL HCL 2.5 MG/ML IV SOLN
INTRAVENOUS | Status: AC
  Filled 2020-09-12: qty 2

## 2020-09-12 MED ORDER — SODIUM CHLORIDE 0.9 % WEIGHT BASED INFUSION
1.0000 mL/kg/h | INTRAVENOUS | Status: DC
  Administered 2020-09-12: 1 mL/kg/h via INTRAVENOUS

## 2020-09-12 MED ORDER — SODIUM CHLORIDE 0.9% FLUSH
3.0000 mL | Freq: Two times a day (BID) | INTRAVENOUS | Status: DC
  Administered 2020-09-13: 3 mL via INTRAVENOUS

## 2020-09-12 MED ORDER — HEPARIN SODIUM (PORCINE) 1000 UNIT/ML IJ SOLN
INTRAMUSCULAR | Status: DC | PRN
  Administered 2020-09-12: 3500 [IU] via INTRAVENOUS

## 2020-09-12 MED ORDER — ACETAMINOPHEN 325 MG PO TABS: 650.0000 mg | ORAL_TABLET | ORAL | Status: DC | PRN

## 2020-09-12 MED ORDER — ONDANSETRON HCL 4 MG/2ML IJ SOLN: 4.0000 mg | Freq: Four times a day (QID) | INTRAMUSCULAR | Status: DC | PRN

## 2020-09-12 MED ORDER — CLOPIDOGREL BISULFATE 75 MG PO TABS
75.0000 mg | ORAL_TABLET | Freq: Every day | ORAL | Status: DC
  Administered 2020-09-12 – 2020-09-13 (×2): 75 mg via ORAL
  Filled 2020-09-12 (×2): qty 1

## 2020-09-12 MED ORDER — IPRATROPIUM-ALBUTEROL 0.5-2.5 (3) MG/3ML IN SOLN: 3.0000 mL | RESPIRATORY_TRACT | Status: DC | PRN

## 2020-09-12 MED ORDER — MIDAZOLAM HCL 2 MG/2ML IJ SOLN
INTRAMUSCULAR | Status: DC | PRN
  Administered 2020-09-12: 1 mg via INTRAVENOUS

## 2020-09-12 MED ORDER — ROSUVASTATIN CALCIUM 10 MG PO TABS
20.0000 mg | ORAL_TABLET | Freq: Every day | ORAL | Status: DC
  Administered 2020-09-13: 20 mg via ORAL
  Filled 2020-09-12: qty 2

## 2020-09-12 MED ORDER — SODIUM CHLORIDE 0.9 % IV SOLN
250.0000 mL | INTRAVENOUS | Status: DC | PRN
Start: 2020-09-12 — End: 2020-09-13

## 2020-09-12 MED ORDER — FENTANYL CITRATE (PF) 100 MCG/2ML IJ SOLN
INTRAMUSCULAR | Status: DC | PRN
  Administered 2020-09-12: 25 ug via INTRAVENOUS

## 2020-09-12 MED ORDER — LIDOCAINE HCL (PF) 1 % IJ SOLN
INTRAMUSCULAR | Status: AC
  Filled 2020-09-12: qty 30

## 2020-09-12 MED ORDER — SODIUM CHLORIDE 0.9 % IV SOLN: 250.0000 mL | INTRAVENOUS | Status: DC | PRN

## 2020-09-12 MED ORDER — HEPARIN (PORCINE) IN NACL 1000-0.9 UT/500ML-% IV SOLN
INTRAVENOUS | Status: DC | PRN
  Administered 2020-09-12: 500 mL

## 2020-09-12 MED ORDER — ASPIRIN 81 MG PO CHEW
CHEWABLE_TABLET | ORAL | Status: AC
  Administered 2020-09-12: 81 mg via ORAL
  Filled 2020-09-12: qty 1

## 2020-09-12 MED ORDER — LABETALOL HCL 5 MG/ML IV SOLN: 10.0000 mg | INTRAVENOUS | Status: AC | PRN

## 2020-09-12 SURGICAL SUPPLY — 7 items
CATH 5F 110X4 TIG (CATHETERS) ×1 IMPLANT
DEVICE RAD TR BAND REGULAR (VASCULAR PRODUCTS) ×1 IMPLANT
GLIDESHEATH SLEND SS 6F .021 (SHEATH) ×1 IMPLANT
GUIDEWIRE INQWIRE 1.5J.035X260 (WIRE) IMPLANT
INQWIRE 1.5J .035X260CM (WIRE) ×2
KIT MANI 3VAL PERCEP (MISCELLANEOUS) ×2 IMPLANT
PACK CARDIAC CATH (CUSTOM PROCEDURE TRAY) ×2 IMPLANT

## 2020-09-12 NOTE — Progress Notes (Signed)
PROGRESS NOTE    NATION CRADLE  ZOX:[id number] DOB: 04-11-1941 DOA: 09/11/2020 PCP: Einar Pheasant, MD   Brief Narrative: Taken from H&P. James Peck is a 80 y.o. male with medical history significant of CAD with stents, HTN, HLD, COPD, former smoker, GERD, presents with CP. Elevated troponin with concern of NSTEMI he was taken to cardiac catheterization, which was negative for any large vessel occlusion but small vessel occlusion cannot be ruled out.  No intervention done. Cardiology is recommending observing for another day and start him on aspirin and Plavix. Echocardiogram pending  Subjective: Patient was seen after cardiac catheterization this morning.  Denies any more chest pain or shortness of breath.  Wife at bedside.  Assessment & Plan:   Principal Problem:   NSTEMI (non-ST elevated myocardial infarction) ([place]) Active Problems:   Acid reflux   Benign essential HTN   HLD (hyperlipidemia)   Coronary artery disease   Chest pain   COPD exacerbation ([place])  NSTEMI.  Elevated troponin with mild ST depression in V2 to V4.  Patient underwent cardiac catheterization which was negative for any large vessel occlusion but small vessels occlusion cannot be excluded. -Start him on aspirin and Plavix. -Continue with high intensity statin -Echocardiogram -Nitroglycerin as needed -A1c pending for risk stratification.  Acid reflux -Continue Protonix  Benign essential HTN.  Blood pressure within goal -Continue irbesartan, amlodipine -IV hydralazine as needed  HLD (hyperlipidemia) -Switch home dose of Zocor with Crestor  COPD exacerbation [facility]):  Patient had some wheezing on arrival to ED and received bronchodilators and Solu-Medrol. No wheezing when seen today. -Continue with as needed bronchodilator  Objective: Vitals:   09/12/20 1030 09/12/20 1100 09/12/20 1130 09/12/20 1219  BP: (!) 104/54 124/65 117/66 119/63  Pulse: 88 85 70 77  Resp: 15 17 16 18   Temp:    97.6 F  (36.4 C)  TempSrc:    Oral  SpO2: 95% 94% 94% 95%  Weight:      Height:        Intake/Output Summary (Last 24 hours) at 09/12/2020 1547 Last data filed at 09/12/2020 0553 Gross per 24 hour  Intake 1163.78 ml  Output --  Net 1163.78 ml   Filed Weights   09/11/20 1137 09/12/20 0742  Weight: 70.3 kg 70.3 kg    Examination:  General exam: Appears calm and comfortable  Respiratory system: Clear to auscultation. Respiratory effort normal. Cardiovascular system: S1 & S2 heard, RRR. Gastrointestinal system: Soft, nontender, nondistended, bowel sounds positive. Central nervous system: Alert and oriented. No focal neurological deficits. Extremities: No edema, no cyanosis, pulses intact and symmetrical. Psychiatry: Judgement and insight appear normal. Mood & affect appropriate.    DVT prophylaxis: SCDs, patient was on heparin infusion before Code Status: Full Family Communication: Wife was updated at bedside Disposition Plan:  Status is: Inpatient  Remains inpatient appropriate because:Inpatient level of care appropriate due to severity of illness   Dispo: The patient is from: Home              Anticipated d/c is to: Home              Anticipated d/c date is: 1 day              Patient currently is not medically stable to d/c.   Difficult to place patient No              Level of care: Progressive Cardiac  Consultants:   Cardiology  Procedures:  Cardiac catheterization  Antimicrobials:   Data Reviewed: I have personally reviewed following labs and imaging studies  CBC: Recent Labs  Lab 09/11/20 1140 09/12/20 0452  WBC 9.0 14.1*  HGB 14.9 14.8  HCT 44.5 42.4  MCV 90.4 89.1  PLT 320 762   Basic Metabolic Panel: Recent Labs  Lab 09/11/20 1140 09/12/20 0452  NA 135 137  K 4.4 4.2  CL 103 104  CO2 22 22  GLUCOSE 119* 146*  BUN 16 11  CREATININE 0.90 0.81  CALCIUM 9.0 8.9   GFR: Estimated Creatinine Clearance: 66.7 mL/min (by C-G formula based on SCr  of 0.81 mg/dL). Liver Function Tests: No results for input(s): AST, ALT, ALKPHOS, BILITOT, PROT, ALBUMIN in the last 168 hours. No results for input(s): LIPASE, AMYLASE in the last 168 hours. No results for input(s): AMMONIA in the last 168 hours. Coagulation Profile: Recent Labs  Lab 09/11/20 1343  INR 1.0   Cardiac Enzymes: No results for input(s): CKTOTAL, CKMB, CKMBINDEX, TROPONINI in the last 168 hours. BNP (last 3 results) No results for input(s): PROBNP in the last 8760 hours. HbA1C: No results for input(s): HGBA1C in the last 72 hours. CBG: No results for input(s): GLUCAP in the last 168 hours. Lipid Profile: Recent Labs    09/12/20 0452  CHOL 157  HDL 54  LDLCALC 93  TRIG 49  CHOLHDL 2.9   Thyroid Function Tests: No results for input(s): TSH, T4TOTAL, FREET4, T3FREE, THYROIDAB in the last 72 hours. Anemia Panel: No results for input(s): VITAMINB12, FOLATE, FERRITIN, TIBC, IRON, RETICCTPCT in the last 72 hours. Sepsis Labs: No results for input(s): PROCALCITON, LATICACIDVEN in the last 168 hours.  Recent Results (from the past 240 hour(s))  SARS CORONAVIRUS 2 (TAT 6-24 HRS) Nasopharyngeal Nasopharyngeal Swab     Status: None   Collection Time: 09/11/20  1:43 PM   Specimen: Nasopharyngeal Swab  Result Value Ref Range Status   SARS Coronavirus 2 NEGATIVE NEGATIVE Final    Comment: (NOTE) SARS-CoV-2 target nucleic acids are NOT DETECTED.  The SARS-CoV-2 RNA is generally detectable in upper and lower respiratory specimens during the acute phase of infection. Negative results do not preclude SARS-CoV-2 infection, do not rule out co-infections with other pathogens, and should not be used as the sole basis for treatment or other patient management decisions. Negative results must be combined with clinical observations, patient history, and epidemiological information. The expected result is Negative.  Fact Sheet for  Patients: SugarRoll.be  Fact Sheet for Healthcare Providers: https://www.woods-mathews.com/  This test is not yet approved or cleared by the [place] FDA and  has been authorized for detection and/or diagnosis of SARS-CoV-2 by FDA under an Emergency Use Authorization (EUA). This EUA will remain  in effect (meaning this test can be used) for the duration of the COVID-19 declaration under Se ction 564(b)(1) of the Act, 21 U.S.C. section 360bbb-3(b)(1), unless the authorization is terminated or revoked sooner.  Performed at [facility], [place] [address] [place], [place] [address]      Radiology Studies: DG Chest 2 View  Result Date: 09/11/2020 CLINICAL DATA:  Chest pain and shortness of breath EXAM: CHEST - 2 VIEW COMPARISON:  March 06, 2020 FINDINGS: Lungs are clear. Heart size and pulmonary vascularity are normal. No adenopathy. There is aortic atherosclerosis. There is mild degenerative change in the thoracic spine. IMPRESSION: Lungs clear. Heart size normal. Aortic Atherosclerosis (ICD10-I70.0). Electronically Signed   By: Lowella Grip III M.D.   On: 09/11/2020 12:09  CARDIAC CATHETERIZATION  Result Date: 09/12/2020  Prox Cx lesion is 30% stenosed.  Mid Cx lesion is 20% stenosed.  Prox LAD lesion is 20% stenosed.  Prox LAD to Mid LAD lesion is 30% stenosed.  Mid LAD to Dist LAD lesion is 50% stenosed.  Prox RCA to Mid RCA lesion is 20% stenosed.  1.  Insignificant coronary artery disease with patent stent proximal/mid RCA, 50% stenosis mid/distal LAD with muscle bridge 2.  Mildly reduced left ventricular function Recommendations 1.  Medical therapy 2.  Aggressive risk factor modification 3.  Dual antiplatelet therapy    Scheduled Meds: . amLODipine  5 mg Oral Daily  . aspirin EC  81 mg Oral Daily  . calcium carbonate  1 tablet Oral Daily  . clopidogrel  75 mg Oral Daily  . fluticasone  1 spray Each Nare Daily  .  ipratropium-albuterol  3 mL Nebulization Q4H  . irbesartan  75 mg Oral Daily  . loratadine  10 mg Oral Daily  . omega-3 acid ethyl esters  1 g Oral Daily  . pantoprazole  40 mg Oral Daily  . simvastatin  40 mg Oral Daily  . sodium chloride flush  3 mL Intravenous Q12H  . sodium chloride flush  3 mL Intravenous Q12H  . umeclidinium-vilanterol  1 puff Inhalation Daily   Continuous Infusions: . sodium chloride 75 mL/hr at 09/12/20 0815  . sodium chloride    . sodium chloride 1 mL/kg/hr (09/12/20 1338)     LOS: 1 day   Time spent: 40 minutes  Lorella Nimrod, MD [facility]  If 7PM-7AM, please contact night-coverage [email or web address]  09/12/2020, 3:47 PM   This record has been created using Systems analyst. Errors have been sought and corrected,but may not always be located. Such creation errors do not reflect on the standard of care.

## 2020-09-12 NOTE — ED Notes (Signed)
BM, NP notified of increasing troponin.

## 2020-09-12 NOTE — Progress Notes (Signed)
*  PRELIMINARY RESULTS* Echocardiogram 2D Echocardiogram has been performed.  James Peck 09/12/2020, 8:03 PM

## 2020-09-12 NOTE — OR Nursing (Signed)
TR band replaced with gauze and tegaderm

## 2020-09-12 NOTE — Consult Note (Signed)
ANTICOAGULATION CONSULT NOTE - Initial Consult  Pharmacy Consult for heparin Indication: chest pain/ACS  Allergies  Allergen Reactions  . Ace Inhibitors     Cough    Patient Measurements: Height: 5\' 6"  (167.6 cm) Weight: 70.3 kg (155 lb) IBW/kg (Calculated) : 63.8 Heparin Dosing Weight: 70.3 kg  Vital Signs: Temp: 97.6 F (36.4 C) (02/13 2216) Temp Source: Oral (02/13 2216) BP: 134/60 (02/14 0130) Pulse Rate: 76 (02/14 0130)  Labs: Recent Labs    09/11/20 1140 09/11/20 1343 09/11/20 2034 09/12/20 0200  HGB 14.9  --   --   --   HCT 44.5  --   --   --   PLT 320  --   --   --   APTT  --  29  --   --   LABPROT  --  12.3  --   --   INR  --  1.0  --   --   HEPARINUNFRC  --   --   --  0.57  CREATININE 0.90  --   --   --   TROPONINIHS 102* 1,149*  532* 9,214* 13,605*    Estimated Creatinine Clearance: 60.1 mL/min (by C-G formula based on SCr of 0.9 mg/dL).   Medical History: Past Medical History:  Diagnosis Date  . Allergy   . Arthritis   . Coronary artery disease   . GERD (gastroesophageal reflux disease)   . History of hiatal hernia   . Hypercholesteremia   . Hypertension     Medications:  No PTA APT or AC No AC allergies   Assessment: James Peck is a 80 y.o. male who presents to the ED for evaluation of chest pain and shortness of breath. Pharmacy has been consulted for heparin dosing for ACS/NSTEMI.  H/H and plts WNL Heparin Dosing Weight: 70.3 kg  Goal of Therapy:  Heparin level 0.3-0.7 units/ml Monitor platelets by anticoagulation protocol: Yes   0214 0200 HL 0.57, therapeutic x 1   Plan:  Continue heparin infusion at 850 units/hr Recheck HL in 8 hours Continue to monitor H & H and plts.  Renda Rolls, PharmD, [facility] 09/12/2020 3:16 AM

## 2020-09-12 NOTE — Consult Note (Signed)
Cardiology Consultation Note    Patient ID: James Peck, MRN: [id number], DOB/AGE: [birth year] 80 y.o. Admit date: 09/11/2020   Date of Consult: 09/12/2020 Primary Physician: Einar Pheasant, MD Primary Cardiologist: Natalee Tomkiewicz  Chief Complaint: chest pain Reason for Consultation: nstemi Requesting MD: Dr. Reesa Chew  HPI: [name] is a 80 y.o. male with history of coronary artery disease status post PCI of the RCA in 1999 and PCI of the LAD in 2014.  He presented to emergency room with chest pain and was ruled in for non-ST elevation myocardial infarction.  He was last seen in our office in 2018.  He has done well from a cardiac standpoint.  He was walking with his wife when he developed chest pain.  Presented to the emergency room where electrocardiogram showed sinus bradycardia but no ST elevation.  He was given heparin aspirin and morphine for pain control.  He is currently hemodynamically stable with mild chest heaviness but no pain.  He has a history of COPD treated with Ventolin at [facility].  He currently is on amlodipine 5 mg daily, simvastatin, telmisartan 20 mg daily.  He was not given a beta-blocker secondary to bradycardia.  He was placed on heparin.  Past Medical History:  Diagnosis Date  . Allergy   . Arthritis   . Coronary artery disease   . GERD (gastroesophageal reflux disease)   . History of hiatal hernia   . Hypercholesteremia   . Hypertension       Surgical History:  Past Surgical History:  Procedure Laterality Date  . CARDIAC CATHETERIZATION    . cardiac stents    . CATARACT EXTRACTION W/PHACO Right 09/10/2017   Procedure: CATARACT EXTRACTION PHACO AND INTRAOCULAR LENS PLACEMENT (IOC);  Surgeon: Birder Robson, MD;  Location: [facility] ORS;  Service: Ophthalmology;  Laterality: Right;  [place] 01:01.7 AP% 15.3 CDE 9.38 Fluid pack lot # [id number]  . CATARACT EXTRACTION W/PHACO Left 10/02/2017   Procedure: CATARACT EXTRACTION PHACO AND INTRAOCULAR LENS PLACEMENT (IOC);  Surgeon:  Birder Robson, MD;  Location: [facility] ORS;  Service: Ophthalmology;  Laterality: Left;  [place] 00:42.3 AP% 16.2 CDE 6.85 Fluid Pack Lot # [id number]  . COLONOSCOPY WITH PROPOFOL N/A 06/28/2017   Procedure: COLONOSCOPY WITH PROPOFOL;  Surgeon: Manya Silvas, MD;  Location: [facility] ENDOSCOPY;  Service: Endoscopy;  Laterality: N/A;  . CORONARY ANGIOPLASTY     STENTS X 2  . KNEE ARTHROSCOPY       Home Meds: Prior to Admission medications   Medication Sig Start Date End Date Taking? Authorizing Provider  albuterol (VENTOLIN HFA) 108 (90 Base) MCG/ACT inhaler Inhale 1-2 puffs into the lungs every 6 (six) hours as needed for wheezing or shortness of breath.   Yes [provider]  amLODipine (NORVASC) 5 MG tablet Take 1 tablet (5 mg total) by mouth daily. 03/17/20  Yes Einar Pheasant, MD  ANORO ELLIPTA 62.5-25 MCG/INH AEPB Inhale 1 puff into the lungs daily. 06/21/20  Yes Lauraine Rinne, NP  aspirin EC 81 MG tablet Take 81 mg by mouth daily.    Yes [provider]  calcium carbonate (OS-CAL - DOSED IN MG OF ELEMENTAL CALCIUM) 1250 (500 CA) MG tablet Take 1 tablet by mouth daily.    Yes [provider]  cetirizine (ZYRTEC) 10 MG tablet TAKE 1 TABLET BY MOUTH DAILY Patient taking differently: Take 10 mg by mouth daily. 08/24/20  Yes Einar Pheasant, MD  chlorpheniramine (CHLOR-TRIMETON) 4 MG tablet Take 4 mg by mouth 2 (two)  times daily as needed for allergies.   Yes [provider]  CINNAMON PO Take 1,200 mg by mouth 2 times daily at 12 noon and 4 pm.   Yes [provider]  dextromethorphan-guaiFENesin (MUCINEX DM) 30-600 MG 12hr tablet Take 1 tablet by mouth 2 (two) times daily.   Yes [provider]  fluticasone (FLONASE) 50 MCG/ACT nasal spray TAKE 2 SPRAYS INTO BOTH NOSTRILS DAILY 03/07/20  Yes Einar Pheasant, MD  GLUCOSAMINE-CHONDROITIN PO Take 1 capsule by mouth daily.   Yes [provider]  Misc Natural Products (BLACK CHERRY CONCENTRATE  PO) Take 1,000 mg daily by mouth.   Yes [provider]  Omega-3 Fatty Acids (FISH OIL) 1000 MG CAPS Take 1,000 mg by mouth daily.    Yes [provider]  omeprazole (PRILOSEC) 20 MG capsule Take 1 capsule (20 mg total) by mouth daily. 03/17/20  Yes Einar Pheasant, MD  simvastatin (ZOCOR) 40 MG tablet Take 1 tablet (40 mg total) by mouth daily. 03/17/20  Yes Einar Pheasant, MD  telmisartan (MICARDIS) 20 MG tablet TAKE ONE TABLET EVERY DAY Patient taking differently: Take 20 mg by mouth daily. 04/06/20  Yes Einar Pheasant, MD  diclofenac sodium (VOLTAREN) 1 % GEL Apply 2 g 2 (two) times daily topically. To affected joint. 06/04/17   Lyndal Pulley, DO  sildenafil (REVATIO) 20 MG tablet TAKE 1 TO 2 TABLETS BY MOUTH EVERY DAY AS NEEDED 06/13/20   Einar Pheasant, MD    Inpatient Medications:  . amLODipine  5 mg Oral Daily  . aspirin  324 mg Oral Once  . aspirin EC  81 mg Oral Daily  . calcium carbonate  1 tablet Oral Daily  . fluticasone  1 spray Each Nare Daily  . heparin  4,000 Units Intravenous Once  . ipratropium-albuterol  3 mL Nebulization Q4H  . irbesartan  75 mg Oral Daily  . loratadine  10 mg Oral Daily  . omega-3 acid ethyl esters  1 g Oral Daily  . pantoprazole  40 mg Oral Daily  . simvastatin  40 mg Oral Daily  . umeclidinium-vilanterol  1 puff Inhalation Daily   . sodium chloride 75 mL/hr at 09/12/20 0153  . heparin 850 Units/hr (09/12/20 0153)    Allergies:  Allergies  Allergen Reactions  . Ace Inhibitors     Cough    Social History   Socioeconomic History  . Marital status: Married    Spouse name: Not on file  . Number of children: Not on file  . Years of education: Not on file  . Highest education level: Not on file  Occupational History  . Not on file  Tobacco Use  . Smoking status: Former Smoker    Packs/day: 1.00    Years: 40.00    Pack years: 40.00    Types: Cigarettes    Quit date: 2000    Years since quitting: 22.1  .  Smokeless tobacco: Never Used  Vaping Use  . Vaping Use: Never used  Substance and Sexual Activity  . Alcohol use: No  . Drug use: No  . Sexual activity: Not on file  Other Topics Concern  . Not on file  Social History Narrative  . Not on file   Social Determinants of Health   Financial Resource Strain: Low Risk   . Difficulty of Paying Living Expenses: Not hard at all  Food Insecurity: No Food Insecurity  . Worried About Charity fundraiser in the Last Year: Never true  .  Ran Out of Food in the Last Year: Never true  Transportation Needs: No Transportation Needs  . Lack of Transportation (Medical): No  . Lack of Transportation (Non-Medical): No  Physical Activity: Sufficiently Active  . Days of Exercise per Week: 5 days  . Minutes of Exercise per Session: 30 min  Stress: No Stress Concern Present  . Feeling of Stress : Not at all  Social Connections: Unknown  . Frequency of Communication with Friends and Family: Not on file  . Frequency of Social Gatherings with Friends and Family: Not on file  . Attends Religious Services: Not on file  . Active Member of Clubs or Organizations: Not on file  . Attends Archivist Meetings: Not on file  . Marital Status: Married  Human resources officer Violence: Not At Risk  . Fear of Current or Ex-Partner: No  . Emotionally Abused: No  . Physically Abused: No  . Sexually Abused: No     Family History  Problem Relation Age of Onset  . Heart disease Mother   . Diabetes Mother   . Heart disease Father   . Diabetes Father   . Alzheimer's disease Brother   . Stroke Brother      Review of Systems: A 12-system review of systems was performed and is negative except as noted in the HPI.  Labs: No results for input(s): CKTOTAL, CKMB, TROPONINI in the last 72 hours. Lab Results  Component Value Date   WBC 9.0 09/11/2020   HGB 14.9 09/11/2020   HCT 44.5 09/11/2020   MCV 90.4 09/11/2020   PLT 320 09/11/2020    Recent Labs  Lab  09/11/20 1140  NA 135  K 4.4  CL 103  CO2 22  BUN 16  CREATININE 0.90  CALCIUM 9.0  GLUCOSE 119*   Lab Results  Component Value Date   CHOL 140 07/15/2020   HDL 41.10 07/15/2020   LDLCALC 79 07/15/2020   TRIG 98.0 07/15/2020   No results found for: DDIMER  Radiology/Studies:  DG Chest 2 View  Result Date: 09/11/2020 CLINICAL DATA:  Chest pain and shortness of breath EXAM: CHEST - 2 VIEW COMPARISON:  March 06, 2020 FINDINGS: Lungs are clear. Heart size and pulmonary vascularity are normal. No adenopathy. There is aortic atherosclerosis. There is mild degenerative change in the thoracic spine. IMPRESSION: Lungs clear. Heart size normal. Aortic Atherosclerosis (ICD10-I70.0). Electronically Signed   By: Lowella Grip III M.D.   On: 09/11/2020 12:09    Wt Readings from Last 3 Encounters:  09/11/20 70.3 kg  08/11/20 70.3 kg  07/19/20 70.1 kg    EKG: Sinus bradycardia  Physical Exam:  Blood pressure 134/60, pulse 76, temperature 97.6 F (36.4 C), temperature source Oral, resp. rate 20, height 5\' 6"  (1.676 m), weight 70.3 kg, SpO2 95 %. Body mass index is 25.02 kg/m. General: Well developed, well nourished, in no acute distress. Head: Normocephalic, atraumatic, sclera non-icteric, no xanthomas, nares are without discharge.  Neck: Negative for carotid bruits. JVD not elevated. Lungs: Clear bilaterally to auscultation without wheezes, rales, or rhonchi. Breathing is unlabored. Heart: RRR with S1 S2. No murmurs, rubs, or gallops appreciated. Abdomen: Soft, non-tender, non-distended with normoactive bowel sounds. No hepatomegaly. No rebound/guarding. No obvious abdominal masses. Msk:  Strength and tone appear normal for age. Extremities: No clubbing or cyanosis. No edema.  Distal pedal pulses are 2+ and equal bilaterally. Neuro: Alert and oriented X 3. No facial asymmetry. No focal deficit. Moves all extremities spontaneously. Psych:  Responds  to questions appropriately with a  normal affect.     Assessment and Plan  80 year old male with a history of coronary disease status post RCA PCI 99 LAD PCI in 14 who presented with chest pain ruled in for non-ST ovation myocardial infarction.  On heparin.  Currently hemodynamically stable.  Risk benefits of left heart catheter explained the patient agrees to proceed.  Plan to proceed with left heart cath with possible PCI with further recommendations is complete.  Signed, Teodoro Spray MD 09/12/2020, 2:40 AM Pager: [phone]

## 2020-09-13 LAB — ECHOCARDIOGRAM COMPLETE
AR max vel: 2.84 cm2
AV Peak grad: 4.8 mmHg
Ao pk vel: 1.1 m/s
Area-P 1/2: 4.41 cm2
Height: 66 in
S' Lateral: 3.03 cm
Weight: 2480 oz

## 2020-09-13 LAB — BASIC METABOLIC PANEL
Anion gap: 7 (ref 5–15)
BUN: 17 mg/dL (ref 8–23)
CO2: 23 mmol/L (ref 22–32)
Calcium: 8.6 mg/dL — ABNORMAL LOW (ref 8.9–10.3)
Chloride: 108 mmol/L (ref 98–111)
Creatinine, Ser: 0.94 mg/dL (ref 0.61–1.24)
GFR, Estimated: >60 mL/min (ref >60)
Glucose, Bld: 129 mg/dL — ABNORMAL HIGH (ref 70–99)
Potassium: 3.8 mmol/L (ref 3.5–5.1)
Sodium: 138 mmol/L (ref 135–145)

## 2020-09-13 LAB — HEMOGLOBIN A1C
Hgb A1c MFr Bld: 6.2 % — ABNORMAL HIGH (ref 4.8–5.6)
Mean Plasma Glucose: 131 mg/dL

## 2020-09-13 LAB — TROPONIN I (HIGH SENSITIVITY)
Troponin I (High Sensitivity): 18990 ng/L (ref <18)
Troponin I (High Sensitivity): 25953 ng/L (ref <18)

## 2020-09-13 MED ORDER — ROSUVASTATIN CALCIUM 20 MG PO TABS
20.0000 mg | ORAL_TABLET | Freq: Every day | ORAL | 0 refills | Status: DC
Start: 2020-09-13 — End: 2023-01-07

## 2020-09-13 MED ORDER — METOPROLOL SUCCINATE ER 25 MG PO TB24: 25.0000 mg | ORAL_TABLET | Freq: Every day | ORAL | 0 refills | Status: DC

## 2020-09-13 MED ORDER — COLCHICINE 0.6 MG PO TABS: 0.6000 mg | ORAL_TABLET | Freq: Every day | ORAL | 2 refills | Status: DC

## 2020-09-13 MED ORDER — CLOPIDOGREL BISULFATE 75 MG PO TABS: 75.0000 mg | ORAL_TABLET | Freq: Every day | ORAL | 0 refills | Status: DC

## 2020-09-13 MED ORDER — NITROGLYCERIN 0.4 MG SL SUBL: 0.4000 mg | SUBLINGUAL_TABLET | SUBLINGUAL | 0 refills | Status: DC | PRN

## 2020-09-13 NOTE — Discharge Instructions (Signed)
Begin taking Plavix 75mg  once daily, 12 hours apart from omeprazole  DO NOT take nitroglycerin if you have taken sildenafil that day  Stop taking amlodipine and begin taking metoprolol 25mg  daily; monitor BP at home

## 2020-09-13 NOTE — Progress Notes (Signed)
[facility] Cardiology    SUBJECTIVE: [name] is a 80 year old male with a past medical history significant for coronary artery disease s/p PCI to the RCA in 1999 and PCI to the LAD in 2014, COPD with former tobacco abuse, hyperlipidemia, hypertension, and GERD who presented to the ED on 09/11/20 for an acute onset of exertional chest pain with associated shortness of breath.  Workup in the ED was consistent with an NSTEMI with high sensitivity troponin 102, 532, 1149, 9214, 13,605, 18,990, and 25,953 respectively and ECG revealing normal sinus rhythm with non-specific ST abnormalities.  He underwent left heart catheterization with Dr. Saralyn Pilar on 09/12/20 which revealed insignificant coronary artery disease with a patent stent in the proximal/mid RCA and 50% stenosis in the mid/distal LAD with muscle bridge.  Medical management was recommended.    09/13/20: [name] is lying in bed, in no acute distress.  He denies any recurrent chest pain, chest pressure, or shortness of breath.  He denies lower extremity swelling, orthopnea, or PND.  He denies syncopal or presyncopal episodes.  He denies any pain, bleeding, or drainage from right radial access site.     Vitals:   09/12/20 1951 09/12/20 2013 09/13/20 0014 09/13/20 0332  BP:  127/67 131/75 127/66  Pulse:  77 77 73  Resp:  17 16 16   Temp:  98.6 F (37 C) 97.8 F (36.6 C) 98.2 F (36.8 C)  TempSrc:   Oral Oral  SpO2: 94% 94% 95% 97%  Weight:    69.4 kg  Height:         Intake/Output Summary (Last 24 hours) at 09/13/2020 0745 Last data filed at 09/13/2020 0745 Gross per 24 hour  Intake --  Output 1050 ml  Net -1050 ml      PHYSICAL EXAM  General: Well developed, well nourished, in no acute distress HEENT:  Normocephalic and atramatic Neck:  No JVD.  Lungs: Clear bilaterally to auscultation and percussion. Heart: HRRR . Normal S1 and S2 without gallops or murmurs.  Abdomen: Bowel sounds are positive, abdomen soft and non-tender  Msk:   Back normal. Normal strength and tone for age. Extremities: No clubbing, cyanosis or edema.   Neuro: Alert and oriented X 3. Psych:  Good affect, responds appropriately   LABS: Basic Metabolic Panel: Recent Labs    09/12/20 0452 09/12/20 2355  NA 137 138  K 4.2 3.8  CL 104 108  CO2 22 23  GLUCOSE 146* 129*  BUN 11 17  CREATININE 0.81 0.94  CALCIUM 8.9 8.6*   Liver Function Tests: No results for input(s): AST, ALT, ALKPHOS, BILITOT, PROT, ALBUMIN in the last 72 hours. No results for input(s): LIPASE, AMYLASE in the last 72 hours. CBC: Recent Labs    09/11/20 1140 09/12/20 0452  WBC 9.0 14.1*  HGB 14.9 14.8  HCT 44.5 42.4  MCV 90.4 89.1  PLT 320 304   Cardiac Enzymes: No results for input(s): CKTOTAL, CKMB, CKMBINDEX, TROPONINI in the last 72 hours. BNP: Invalid input(s): POCBNP D-Dimer: No results for input(s): DDIMER in the last 72 hours. Hemoglobin A1C: Recent Labs    09/12/20 0452  HGBA1C 6.2*   Fasting Lipid Panel: Recent Labs    09/12/20 0452  CHOL 157  HDL 54  LDLCALC 93  TRIG 49  CHOLHDL 2.9   Thyroid Function Tests: No results for input(s): TSH, T4TOTAL, T3FREE, THYROIDAB in the last 72 hours.  Invalid input(s): FREET3 Anemia Panel: No results for input(s): VITAMINB12, FOLATE, FERRITIN, TIBC,  IRON, RETICCTPCT in the last 72 hours.  DG Chest 2 View  Result Date: 09/11/2020 CLINICAL DATA:  Chest pain and shortness of breath EXAM: CHEST - 2 VIEW COMPARISON:  March 06, 2020 FINDINGS: Lungs are clear. Heart size and pulmonary vascularity are normal. No adenopathy. There is aortic atherosclerosis. There is mild degenerative change in the thoracic spine. IMPRESSION: Lungs clear. Heart size normal. Aortic Atherosclerosis (ICD10-I70.0). Electronically Signed   By: Lowella Grip III M.D.   On: 09/11/2020 12:09   CARDIAC CATHETERIZATION  Result Date: 09/12/2020  Prox Cx lesion is 30% stenosed.  Mid Cx lesion is 20% stenosed.  Prox LAD lesion is  20% stenosed.  Prox LAD to Mid LAD lesion is 30% stenosed.  Mid LAD to Dist LAD lesion is 50% stenosed.  Prox RCA to Mid RCA lesion is 20% stenosed.  1.  Insignificant coronary artery disease with patent stent proximal/mid RCA, 50% stenosis mid/distal LAD with muscle bridge 2.  Mildly reduced left ventricular function Recommendations 1.  Medical therapy 2.  Aggressive risk factor modification 3.  Dual antiplatelet therapy     TELEMETRY: Normal sinus rhythm, rate in the 80s   ASSESSMENT AND PLAN:  Principal Problem:   NSTEMI (non-ST elevated myocardial infarction) (HCC) Active Problems:   Acid reflux   Benign essential HTN   HLD (hyperlipidemia)   Coronary artery disease   Chest pain   COPD exacerbation ([place])    1.  NSTEMI   -S/p left heart catheterization revealing no evidence of obstructive CAD; recommendations to treat medically   -Will continue aspirin; Plavix 75mg  daily added  -Home dose of simvastatin discontinued; rosuvastatin 20mg  added  -Allergic to ACE inhibitors; continue home dose of telmisartan   -Will add metoprolol succinate 25mg  daily and discontinue amlodipine   2.  COPD  -Former smoker; continue current medications   3.  Hyperlipidemia   -Previously on simvastatin; rosuvastatin 20mg  added this admission   4.  Hypertension   -Will discontinue amlodipine and add metoprolol succinate 25mg  daily   -Continue telmisartan 20mg  daily     AMI Discharge   Aspirin prescribed at discharge:  Yes  High Intensity Statin Prescribed? (Lipitor 40-80mg  or Crestor 20-40mg ): Yes  Beta Blocker Prescribed: Yes  ADP Receptor Inhibitor Prescribed? (i.e. Plavix etc.-Includes Medically Managed Patients): Yes  ACEI/ARB Prescribed? (If NO document contraindications)  Yes  Aldosterone Inhibitor Prescribed? No: Not indicated  Was EF assessed during THIS hospitalization? Yes  (YES = Measured in current episode of care or document plan to evaluate after discharge.)  Was EF <  40% ? No:   Was Cardiac Rehab II ordered? (Includes Medically managed Patients): Yes  Was Smoking Cessation Advice provided?  No: Quit in 2000   The history, physical exam findings, and plan of care were all discussed with Dr. Bartholome Bill, and all decision making was made in collaboration.    Avie Arenas  PA-C 09/13/2020 7:45 AM

## 2020-09-13 NOTE — Plan of Care (Signed)
  Problem: Cardiovascular: Goal: Vascular access site(s) Level 0-1 will be maintained Outcome: Progressing  Right radial site without bleed or hematoma.

## 2020-09-13 NOTE — Discharge Summary (Signed)
Physician Discharge Summary  James Peck TWS:[id number] DOB: [birth year] DOA: 09/11/2020  PCP: Einar Pheasant, MD  Admit date: 09/11/2020 Discharge date: 09/13/2020  Admitted From: Home Disposition: Home  Recommendations for Outpatient Follow-up:  1. Follow up with PCP in 1-2 weeks 2. Follow-up in cardiology in 1 week 3. Please obtain troponin levels to ensure it started improving. 4. Please obtain BMP/CBC in one week 5. Please follow up on the following pending results: None  Home Health: No Equipment/Devices: None Discharge Condition: Stable CODE STATUS: Full Diet recommendation: Heart Healthy / Carb Modified   Brief/Interim Summary: James Peck a 80 y.o.malewith medical history significant ofCAD with stents, HTN, HLD, COPD, former smoker, GERD, presents with CP. Elevated troponin with concern of NSTEMI he was taken to cardiac catheterization, which was negative for any large vessel occlusion but small vessel occlusion cannot be ruled out.  No intervention done. Cardiology is recommending observing for another day and start him on aspirin and Plavix. Echocardiogram with normal EF and grade 1 diastolic dysfunction, no other significant abnormality. Persistently elevated troponin which continue to trend up, last check was more than 25,000 this morning.  Patient remained chest pain-free.  No shortness of breath.  Discussed with cardiology and there might be some element of myocarditis.  We started him on colchicine and he will follow-up with cardiology closely as an outpatient.  Patient will continue rest of his home medications and follow-up with his providers.  Discharge Diagnoses:  Principal Problem:   NSTEMI (non-ST elevated myocardial infarction) ([place]) Active Problems:   Acid reflux   Benign essential HTN   HLD (hyperlipidemia)   Coronary artery disease   Chest pain   COPD exacerbation [facility])   Discharge Instructions  Discharge Instructions    Diet - low sodium  heart healthy   Complete by: As directed    Discharge instructions   Complete by: As directed    It was pleasure taking care of you. Your cardiologist make some changes to your medications, please start taking according to your new regimen. We also added colchicine for some concern of inflammation in your heart muscles, continue taking them and follow-up with your cardiologist in 1 week. Keep yourself well-hydrated and if you experience any chest pain or shortness of breath please seek medical attention.   Increase activity slowly   Complete by: As directed      Allergies as of 09/13/2020      Reactions   Ace Inhibitors    Cough      Medication List    STOP taking these medications   amLODipine 5 MG tablet Commonly known as: NORVASC   simvastatin 40 MG tablet Commonly known as: ZOCOR     TAKE these medications   albuterol 108 (90 Base) MCG/ACT inhaler Commonly known as: VENTOLIN HFA Inhale 1-2 puffs into the lungs every 6 (six) hours as needed for wheezing or shortness of breath.   Anoro Ellipta 62.5-25 MCG/INH Aepb Generic drug: umeclidinium-vilanterol Inhale 1 puff into the lungs daily.   aspirin EC 81 MG tablet Take 81 mg by mouth daily.   BLACK CHERRY CONCENTRATE PO Take 1,000 mg daily by mouth.   calcium carbonate 1250 (500 Ca) MG tablet Commonly known as: OS-CAL - dosed in mg of elemental calcium Take 1 tablet by mouth daily.   cetirizine 10 MG tablet Commonly known as: ZYRTEC TAKE 1 TABLET BY MOUTH DAILY   chlorpheniramine 4 MG tablet Commonly known as: CHLOR-TRIMETON Take 4 mg by mouth 2 (  two) times daily as needed for allergies.   CINNAMON PO Take 1,200 mg by mouth 2 times daily at 12 noon and 4 pm.   clopidogrel 75 MG tablet Commonly known as: PLAVIX Take 1 tablet (75 mg total) by mouth daily.   colchicine 0.6 MG tablet Take 1 tablet (0.6 mg total) by mouth daily.   dextromethorphan-guaiFENesin 30-600 MG 12hr tablet Commonly known as: MUCINEX  DM Take 1 tablet by mouth 2 (two) times daily.   diclofenac sodium 1 % Gel Commonly known as: VOLTAREN Apply 2 g 2 (two) times daily topically. To affected joint.   Fish Oil 1000 MG Caps Take 1,000 mg by mouth daily.   fluticasone 50 MCG/ACT nasal spray Commonly known as: FLONASE TAKE 2 SPRAYS INTO BOTH NOSTRILS DAILY   GLUCOSAMINE-CHONDROITIN PO Take 1 capsule by mouth daily.   metoprolol succinate 25 MG 24 hr tablet Commonly known as: Toprol XL Take 1 tablet (25 mg total) by mouth daily.   nitroGLYCERIN 0.4 MG SL tablet Commonly known as: NITROSTAT Place 1 tablet (0.4 mg total) under the tongue every 5 (five) minutes as needed for chest pain.   omeprazole 20 MG capsule Commonly known as: PRILOSEC Take 1 capsule (20 mg total) by mouth daily.   rosuvastatin 20 MG tablet Commonly known as: CRESTOR Take 1 tablet (20 mg total) by mouth daily.   sildenafil 20 MG tablet Commonly known as: REVATIO TAKE 1 TO 2 TABLETS BY MOUTH EVERY DAY AS NEEDED   telmisartan 20 MG tablet Commonly known as: MICARDIS TAKE ONE TABLET EVERY DAY       Follow-up Information    Teodoro Spray, MD On 09/26/2020.   Specialty: Cardiology Why: @ 10am Contact information: [place] [address] [phone]        Einar Pheasant, MD. Schedule an appointment as soon as possible for a visit on 09/15/2020.   Specialty: Internal Medicine Why: @ 3:30pm Contact information: [address]  [place] [address] [phone]              Allergies  Allergen Reactions  . Ace Inhibitors     Cough    Consultations:  Cardiology  Procedures/Studies: DG Chest 2 View  Result Date: 09/11/2020 CLINICAL DATA:  Chest pain and shortness of breath EXAM: CHEST - 2 VIEW COMPARISON:  March 06, 2020 FINDINGS: Lungs are clear. Heart size and pulmonary vascularity are normal. No adenopathy. There is aortic atherosclerosis. There is mild degenerative  change in the thoracic spine. IMPRESSION: Lungs clear. Heart size normal. Aortic Atherosclerosis (ICD10-I70.0). Electronically Signed   By: Lowella Grip III M.D.   On: 09/11/2020 12:09   CARDIAC CATHETERIZATION  Result Date: 09/12/2020  Prox Cx lesion is 30% stenosed.  Mid Cx lesion is 20% stenosed.  Prox LAD lesion is 20% stenosed.  Prox LAD to Mid LAD lesion is 30% stenosed.  Mid LAD to Dist LAD lesion is 50% stenosed.  Prox RCA to Mid RCA lesion is 20% stenosed.  1.  Insignificant coronary artery disease with patent stent proximal/mid RCA, 50% stenosis mid/distal LAD with muscle bridge 2.  Mildly reduced left ventricular function Recommendations 1.  Medical therapy 2.  Aggressive risk factor modification 3.  Dual antiplatelet therapy   ECHOCARDIOGRAM COMPLETE  Result Date: 09/13/2020    ECHOCARDIOGRAM REPORT   Patient Name:   ADNAN VANVOORHIS [facility] Date of Exam: 09/12/2020 Medical Rec #:  [id number]    Height:       19.0  in Accession #:    [id number]   Weight:       155.0 lb Date of Birth:  1941/04/09    BSA:          1.794 m Patient Age:    38 years     BP:           114/69 mmHg Patient Gender: M            HR:           69 bpm. Exam Location:  [facility] Procedure: Cardiac Doppler, Color Doppler and 2D Echo Indications:     NSTEMI I21.4  History:         Patient has no prior history of Echocardiogram examinations.                  CAD; Risk Factors:Hypertension.  Sonographer:     Alyse Low Roar Referring Phys:  [id number] Pike Scantlebury Diagnosing Phys: Isaias Cowman MD IMPRESSIONS  1. Left ventricular ejection fraction, by estimation, is 60 to 65%. The left ventricle has normal function. The left ventricle has no regional wall motion abnormalities. Left ventricular diastolic parameters are consistent with Grade I diastolic dysfunction (impaired relaxation).  2. Right ventricular systolic function is normal. The right ventricular size is normal.  3. The mitral valve is normal in structure. Mild mitral valve  regurgitation. No evidence of mitral stenosis.  4. The aortic valve is normal in structure. Aortic valve regurgitation is not visualized. No aortic stenosis is present.  5. The inferior vena cava is normal in size with greater than 50% respiratory variability, suggesting right atrial pressure of 3 mmHg. FINDINGS  Left Ventricle: Left ventricular ejection fraction, by estimation, is 60 to 65%. The left ventricle has normal function. The left ventricle has no regional wall motion abnormalities. The left ventricular internal cavity size was normal in size. There is  no left ventricular hypertrophy. Left ventricular diastolic parameters are consistent with Grade I diastolic dysfunction (impaired relaxation). Right Ventricle: The right ventricular size is normal. No increase in right ventricular wall thickness. Right ventricular systolic function is normal. Left Atrium: Left atrial size was normal in size. Right Atrium: Right atrial size was normal in size. Pericardium: There is no evidence of pericardial effusion. Mitral Valve: The mitral valve is normal in structure. Mild mitral valve regurgitation. No evidence of mitral valve stenosis. Tricuspid Valve: The tricuspid valve is normal in structure. Tricuspid valve regurgitation is mild . No evidence of tricuspid stenosis. Aortic Valve: The aortic valve is normal in structure. Aortic valve regurgitation is not visualized. No aortic stenosis is present. Aortic valve peak gradient measures 4.8 mmHg. Pulmonic Valve: The pulmonic valve was normal in structure. Pulmonic valve regurgitation is not visualized. No evidence of pulmonic stenosis. Aorta: The aortic root is normal in size and structure. Venous: The inferior vena cava is normal in size with greater than 50% respiratory variability, suggesting right atrial pressure of 3 mmHg. IAS/Shunts: No atrial level shunt detected by color flow Doppler.  LEFT VENTRICLE PLAX 2D LVIDd:         4.53 cm  Diastology LVIDs:         3.03  cm  LV e' medial:    7.51 cm/s LV PW:         0.97 cm  LV E/e' medial:  12.6 LV IVS:        0.95 cm  LV e' lateral:   6.85 cm/s LVOT diam:     1.90 cm  LV E/e' lateral: 13.8 LVOT Area:     2.84 cm  RIGHT VENTRICLE RV Mid diam:    2.84 cm RV S prime:     14.10 cm/s TAPSE (M-mode): 2.8 cm LEFT ATRIUM             Index       RIGHT ATRIUM           Index LA diam:        3.30 cm 1.84 cm/m  RA Area:     13.20 cm LA Vol (A2C):   36.3 ml 20.23 ml/m RA Volume:   30.60 ml  17.05 ml/m LA Vol (A4C):   33.8 ml 18.84 ml/m LA Biplane Vol: 35.1 ml 19.56 ml/m  AORTIC VALVE                PULMONIC VALVE AV Area (Vmax): 2.84 cm    PV Vmax:        1.14 m/s AV Vmax:        110.00 cm/s PV Peak grad:   5.2 mmHg AV Peak Grad:   4.8 mmHg    RVOT Peak grad: 2 mmHg LVOT Vmax:      110.00 cm/s  AORTA Ao Root diam: 3.10 cm MITRAL VALVE MV Area (PHT): 4.41 cm     SHUNTS MV Decel Time: 172 msec     Systemic Diam: 1.90 cm MV E velocity: 94.60 cm/s MV A velocity: 122.00 cm/s MV E/A ratio:  0.78 MV A Prime:    12.8 cm/s Isaias Cowman MD Electronically signed by Isaias Cowman MD Signature Date/Time: 09/13/2020/1:12:51 PM    Final     Subjective: Patient was seen and examined today.  No new complaint.  No chest pain or shortness of breath.  We discussed about the possibility of myocarditis as his troponin continued to rise.  No recent viral illnesses. He wants to go home and will follow up with cardiology as an outpatient. All questions answered.  Discharge Exam: Vitals:   09/13/20 0746 09/13/20 1137  BP: 109/75 125/70  Pulse: 83 81  Resp: 16 20  Temp: 98.5 F (36.9 C) 98.1 F (36.7 C)  SpO2: 95% 96%   Vitals:   09/13/20 0014 09/13/20 0332 09/13/20 0746 09/13/20 1137  BP: 131/75 127/66 109/75 125/70  Pulse: 77 73 83 81  Resp: 16 16 16 20   Temp: 97.8 F (36.6 C) 98.2 F (36.8 C) 98.5 F (36.9 C) 98.1 F (36.7 C)  TempSrc: Oral Oral Oral Oral  SpO2: 95% 97% 95% 96%  Weight:  69.4 kg    Height:         General: Pt is alert, awake, not in acute distress Cardiovascular: RRR, S1/S2 +, no rubs, no gallops Respiratory: CTA bilaterally, no wheezing, no rhonchi Abdominal: Soft, NT, ND, bowel sounds + Extremities: no edema, no cyanosis   The results of significant diagnostics from this hospitalization (including imaging, microbiology, ancillary and laboratory) are listed below for reference.    Microbiology: Recent Results (from the past 240 hour(s))  SARS CORONAVIRUS 2 (TAT 6-24 HRS) Nasopharyngeal Nasopharyngeal Swab     Status: None   Collection Time: 09/11/20  1:43 PM   Specimen: Nasopharyngeal Swab  Result Value Ref Range Status   SARS Coronavirus 2 NEGATIVE NEGATIVE Final    Comment: (NOTE) SARS-CoV-2 target nucleic acids are NOT DETECTED.  The SARS-CoV-2 RNA is generally detectable in upper and lower respiratory specimens during the acute phase of infection. Negative results do not preclude SARS-CoV-2  infection, do not rule out co-infections with other pathogens, and should not be used as the sole basis for treatment or other patient management decisions. Negative results must be combined with clinical observations, patient history, and epidemiological information. The expected result is Negative.  Fact Sheet for Patients: SugarRoll.be  Fact Sheet for Healthcare Providers: https://www.woods-mathews.com/  This test is not yet approved or cleared by the [place] FDA and  has been authorized for detection and/or diagnosis of SARS-CoV-2 by FDA under an Emergency Use Authorization (EUA). This EUA will remain  in effect (meaning this test can be used) for the duration of the COVID-19 declaration under Se ction 564(b)(1) of the Act, 21 U.S.C. section 360bbb-3(b)(1), unless the authorization is terminated or revoked sooner.  Performed at [facility], [place] [address] [place], [place] [address]      Labs: BNP (last 3  results) Recent Labs    09/11/20 1140  BNP 77.4   Basic Metabolic Panel: Recent Labs  Lab 09/11/20 1140 09/12/20 0452 09/12/20 2355  NA 135 137 138  K 4.4 4.2 3.8  CL 103 104 108  CO2 22 22 23   GLUCOSE 119* 146* 129*  BUN 16 11 17   CREATININE 0.90 0.81 0.94  CALCIUM 9.0 8.9 8.6*   Liver Function Tests: No results for input(s): AST, ALT, ALKPHOS, BILITOT, PROT, ALBUMIN in the last 168 hours. No results for input(s): LIPASE, AMYLASE in the last 168 hours. No results for input(s): AMMONIA in the last 168 hours. CBC: Recent Labs  Lab 09/11/20 1140 09/12/20 0452  WBC 9.0 14.1*  HGB 14.9 14.8  HCT 44.5 42.4  MCV 90.4 89.1  PLT 320 304   Cardiac Enzymes: No results for input(s): CKTOTAL, CKMB, CKMBINDEX, TROPONINI in the last 168 hours. BNP: Invalid input(s): POCBNP CBG: No results for input(s): GLUCAP in the last 168 hours. D-Dimer No results for input(s): DDIMER in the last 72 hours. Hgb A1c Recent Labs    09/12/20 0452  HGBA1C 6.2*   Lipid Profile Recent Labs    09/12/20 0452  CHOL 157  HDL 54  LDLCALC 93  TRIG 49  CHOLHDL 2.9   Thyroid function studies No results for input(s): TSH, T4TOTAL, T3FREE, THYROIDAB in the last 72 hours.  Invalid input(s): FREET3 Anemia work up No results for input(s): VITAMINB12, FOLATE, FERRITIN, TIBC, IRON, RETICCTPCT in the last 72 hours. Urinalysis    Component Value Date/Time   APPEARANCEUR Clear 08/11/2020 1526   GLUCOSEU Negative 08/11/2020 1526   BILIRUBINUR Negative 08/11/2020 1526   PROTEINUR Negative 08/11/2020 1526   NITRITE Negative 08/11/2020 1526   LEUKOCYTESUR Negative 08/11/2020 1526   Sepsis Labs Invalid input(s): PROCALCITONIN,  WBC,  LACTICIDVEN Microbiology Recent Results (from the past 240 hour(s))  SARS CORONAVIRUS 2 (TAT 6-24 HRS) Nasopharyngeal Nasopharyngeal Swab     Status: None   Collection Time: 09/11/20  1:43 PM   Specimen: Nasopharyngeal Swab  Result Value Ref Range Status   SARS  Coronavirus 2 NEGATIVE NEGATIVE Final    Comment: (NOTE) SARS-CoV-2 target nucleic acids are NOT DETECTED.  The SARS-CoV-2 RNA is generally detectable in upper and lower respiratory specimens during the acute phase of infection. Negative results do not preclude SARS-CoV-2 infection, do not rule out co-infections with other pathogens, and should not be used as the sole basis for treatment or other patient management decisions. Negative results must be combined with clinical observations, patient history, and epidemiological information. The expected result is Negative.  Fact Sheet for Patients:  SugarRoll.be  Fact Sheet for Healthcare Providers: https://www.woods-mathews.com/  This test is not yet approved or cleared by the [place] FDA and  has been authorized for detection and/or diagnosis of SARS-CoV-2 by FDA under an Emergency Use Authorization (EUA). This EUA will remain  in effect (meaning this test can be used) for the duration of the COVID-19 declaration under Se ction 564(b)(1) of the Act, 21 U.S.C. section 360bbb-3(b)(1), unless the authorization is terminated or revoked sooner.  Performed at [facility], [place] [address] [place], [place] [address]     Time coordinating discharge: Over 30 minutes  SIGNED:  Lorella Nimrod, MD  [facility] 09/13/2020, 5:50 PM  If 7PM-7AM, please contact night-coverage [email or web address]  This record has been created using Systems analyst. Errors have been sought and corrected,but may not always be located. Such creation errors do not reflect on the standard of care.

## 2020-09-13 NOTE — Plan of Care (Signed)
PT ready for discharge IV and tele removed.  Discharge instructions reviewed with patient and spouse Time allowed for questions and concern Verbalizes an understanding on all discharge planning  Problem: Education: Goal: Knowledge of General Education information will improve Description: Including pain rating scale, medication(s)/side effects and non-pharmacologic comfort measures Outcome: Adequate for Discharge   Problem: Health Behavior/Discharge Planning: Goal: Ability to manage health-related needs will improve Outcome: Adequate for Discharge   Problem: Clinical Measurements: Goal: Ability to maintain clinical measurements within normal limits will improve Outcome: Adequate for Discharge Goal: Will remain free from infection Outcome: Adequate for Discharge Goal: Diagnostic test results will improve Outcome: Adequate for Discharge Goal: Respiratory complications will improve Outcome: Adequate for Discharge Goal: Cardiovascular complication will be avoided Outcome: Adequate for Discharge   Problem: Activity: Goal: Risk for activity intolerance will decrease Outcome: Adequate for Discharge   Problem: Nutrition: Goal: Adequate nutrition will be maintained Outcome: Adequate for Discharge   Problem: Coping: Goal: Level of anxiety will decrease Outcome: Adequate for Discharge   Problem: Elimination: Goal: Will not experience complications related to bowel motility Outcome: Adequate for Discharge Goal: Will not experience complications related to urinary retention Outcome: Adequate for Discharge   Problem: Pain Managment: Goal: General experience of comfort will improve Outcome: Adequate for Discharge   Problem: Safety: Goal: Ability to remain free from injury will improve Outcome: Adequate for Discharge   Problem: Skin Integrity: Goal: Risk for impaired skin integrity will decrease Outcome: Adequate for Discharge   Problem: Cardiovascular: Goal: Vascular access  site(s) Level 0-1 will be maintained Outcome: Adequate for Discharge   Problem: Education: Goal: Understanding of CV disease, CV risk reduction, and recovery process will improve Outcome: Adequate for Discharge Goal: Individualized Educational Video(s) Outcome: Adequate for Discharge   Problem: Activity: Goal: Ability to return to baseline activity level will improve Outcome: Adequate for Discharge   Problem: Cardiovascular: Goal: Ability to achieve and maintain adequate cardiovascular perfusion will improve Outcome: Adequate for Discharge Goal: Vascular access site(s) Level 0-1 will be maintained Outcome: Adequate for Discharge   Problem: Health Behavior/Discharge Planning: Goal: Ability to safely manage health-related needs after discharge will improve Outcome: Adequate for Discharge

## 2020-09-14 ENCOUNTER — Telehealth: Payer: Self-pay

## 2020-09-14 NOTE — Telephone Encounter (Signed)
Transition Care Management Follow-up Telephone Call  Date of discharge and from where: 09/13/20-[facility]  How have you been since you were released from the hospital? Doing pretty good but still a little weak.  Any questions or concerns? No  Items Reviewed:  Did the pt receive and understand the discharge instructions provided? Yes   Medications obtained and verified? Yes   Other? Yes   Any new allergies since your discharge? No   Dietary orders reviewed? Yes  Do you have support at home? Yes   Home Care and Equipment/Supplies: Were home health services ordered? no If so, what is the name of the agency? n/a  Has the agency set up a time to come to the patient's home? not applicable Were any new equipment or medical supplies ordered?  No What is the name of the medical supply agency? n/a Were you able to get the supplies/equipment? not applicable Do you have any questions related to the use of the equipment or supplies? n/a  Functional Questionnaire: (I = Independent and D = Dependent) ADLs: I  Bathing/Dressing- I  Meal Prep- I  Eating- I  Maintaining continence- I  Transferring/Ambulation- I  Managing Meds- I  Follow up appointments reviewed:   PCP Hospital f/u appt confirmed? Yes  Scheduled to see Dr. Nicki Reaper on 09/15/20 @ 3;30.  [facility] f/u appt confirmed? No  Cardiology to call & schedule  Are transportation arrangements needed? No   If their condition worsens, is the pt aware to call PCP or go to the Emergency Dept.? Yes  Was the patient provided with contact information for the PCP's office or ED? Yes  Was to pt encouraged to call back with questions or concerns? Yes

## 2020-09-15 ENCOUNTER — Ambulatory Visit (INDEPENDENT_AMBULATORY_CARE_PROVIDER_SITE_OTHER): Payer: PPO | Admitting: Internal Medicine

## 2020-09-15 ENCOUNTER — Other Ambulatory Visit: Payer: Self-pay

## 2020-09-15 DIAGNOSIS — J439 Emphysema, unspecified: Secondary | ICD-10-CM | POA: Diagnosis not present

## 2020-09-15 DIAGNOSIS — I2510 Atherosclerotic heart disease of native coronary artery without angina pectoris: Secondary | ICD-10-CM | POA: Diagnosis not present

## 2020-09-15 DIAGNOSIS — R739 Hyperglycemia, unspecified: Secondary | ICD-10-CM

## 2020-09-15 NOTE — Progress Notes (Signed)
Patient ID: James Peck, male   DOB: October 19, 1940, 80 y.o.   MRN: [id number] .   Subjective:    Patient ID: James Peck, male    DOB: 24-Nov-1940, 80 y.o.   MRN: [id number]  HPI This visit occurred during the SARS-CoV-2 public health emergency.  Safety protocols were in place, including screening questions prior to the visit, additional usage of staff PPE, and extensive cleaning of exam room while observing appropriate contact time as indicated for disinfecting solutions.  Patient here for hospital follow up.  Hospitalized 09/11/20 - 09/13/20.  Admitted with chest pain.  Found to have elevated troponin,  Concern regarding NSTEMI.  Cath - negative for any large vessel occlusion.  Started on aspirin and plavix.  ECHO - normal EF with grade 1 diastolic dysfunction. Chest pain free in hospital.  Troponin remained elevated.  Started on colchicine with concern regarding myocarditis.  Since his hospitalization, he has not had any chest pain.  Some fatigue, but gradually improving.  Eating.  Normal bowel movement yesterday.  Blood pressure has been low.  Spoke to cardiology yesterday.  Instructed to hold toprol.  Did not take today.  Breathing stable.     Past Medical History:  Diagnosis Date  . Allergy   . Arthritis   . Coronary artery disease   . GERD (gastroesophageal reflux disease)   . History of hiatal hernia   . Hypercholesteremia   . Hypertension    Past Surgical History:  Procedure Laterality Date  . CARDIAC CATHETERIZATION    . cardiac stents    . CATARACT EXTRACTION W/PHACO Right 09/10/2017   Procedure: CATARACT EXTRACTION PHACO AND INTRAOCULAR LENS PLACEMENT (IOC);  Surgeon: Birder Robson, MD;  Location: [facility] ORS;  Service: Ophthalmology;  Laterality: Right;  [place] 01:01.7 AP% 15.3 CDE 9.38 Fluid pack lot # [id number]  . CATARACT EXTRACTION W/PHACO Left 10/02/2017   Procedure: CATARACT EXTRACTION PHACO AND INTRAOCULAR LENS PLACEMENT (IOC);  Surgeon: Birder Robson, MD;  Location: [facility]  ORS;  Service: Ophthalmology;  Laterality: Left;  [place] 00:42.3 AP% 16.2 CDE 6.85 Fluid Pack Lot # [id number]  . COLONOSCOPY WITH PROPOFOL N/A 06/28/2017   Procedure: COLONOSCOPY WITH PROPOFOL;  Surgeon: Manya Silvas, MD;  Location: [facility] ENDOSCOPY;  Service: Endoscopy;  Laterality: N/A;  . CORONARY ANGIOPLASTY     STENTS X 2  . KNEE ARTHROSCOPY    . LEFT HEART CATH AND CORONARY ANGIOGRAPHY N/A 09/12/2020   Procedure: LEFT HEART CATH AND CORONARY ANGIOGRAPHY;  Surgeon: Isaias Cowman, MD;  Location: [facility];  Service: Cardiovascular;  Laterality: N/A;   Family History  Problem Relation Age of Onset  . Heart disease Mother   . Diabetes Mother   . Heart disease Father   . Diabetes Father   . Alzheimer's disease Brother   . Stroke Brother    Social History   Socioeconomic History  . Marital status: Married    Spouse name: Not on file  . Number of children: Not on file  . Years of education: Not on file  . Highest education level: Not on file  Occupational History  . Not on file  Tobacco Use  . Smoking status: Former Smoker    Packs/day: 1.00    Years: 40.00    Pack years: 40.00    Types: Cigarettes    Quit date: 2000    Years since quitting: 22.1  . Smokeless tobacco: Never Used  Vaping Use  . Vaping Use: Never used  Substance and Sexual  Activity  . Alcohol use: No  . Drug use: No  . Sexual activity: Not on file  Other Topics Concern  . Not on file  Social History Narrative  . Not on file   Social Determinants of Health   Financial Resource Strain: Low Risk   . Difficulty of Paying Living Expenses: Not hard at all  Food Insecurity: No Food Insecurity  . Worried About Charity fundraiser in the Last Year: Never true  . Ran Out of Food in the Last Year: Never true  Transportation Needs: No Transportation Needs  . Lack of Transportation (Medical): No  . Lack of Transportation (Non-Medical): No  Physical Activity: Sufficiently Active  . Days of  Exercise per Week: 5 days  . Minutes of Exercise per Session: 30 min  Stress: No Stress Concern Present  . Feeling of Stress : Not at all  Social Connections: Unknown  . Frequency of Communication with Friends and Family: Not on file  . Frequency of Social Gatherings with Friends and Family: Not on file  . Attends Religious Services: Not on file  . Active Member of Clubs or Organizations: Not on file  . Attends Archivist Meetings: Not on file  . Marital Status: Married    Outpatient Encounter Medications as of 09/15/2020  Medication Sig  . albuterol (VENTOLIN HFA) 108 (90 Base) MCG/ACT inhaler Inhale 1-2 puffs into the lungs every 6 (six) hours as needed for wheezing or shortness of breath.  Jearl Klinefelter ELLIPTA 62.5-25 MCG/INH AEPB Inhale 1 puff into the lungs daily.  Marland Kitchen aspirin EC 81 MG tablet Take 81 mg by mouth daily.   . calcium carbonate (OS-CAL - DOSED IN MG OF ELEMENTAL CALCIUM) 1250 (500 CA) MG tablet Take 1 tablet by mouth daily.   . cetirizine (ZYRTEC) 10 MG tablet TAKE 1 TABLET BY MOUTH DAILY (Patient taking differently: Take 10 mg by mouth daily.)  . chlorpheniramine (CHLOR-TRIMETON) 4 MG tablet Take 4 mg by mouth 2 (two) times daily as needed for allergies.  Marland Kitchen CINNAMON PO Take 1,200 mg by mouth 2 times daily at 12 noon and 4 pm.  . clopidogrel (PLAVIX) 75 MG tablet Take 1 tablet (75 mg total) by mouth daily.  . colchicine 0.6 MG tablet Take 1 tablet (0.6 mg total) by mouth daily.  Marland Kitchen dextromethorphan-guaiFENesin (MUCINEX DM) 30-600 MG 12hr tablet Take 1 tablet by mouth 2 (two) times daily.  . diclofenac sodium (VOLTAREN) 1 % GEL Apply 2 g 2 (two) times daily topically. To affected joint.  . fluticasone (FLONASE) 50 MCG/ACT nasal spray TAKE 2 SPRAYS INTO BOTH NOSTRILS DAILY  . GLUCOSAMINE-CHONDROITIN PO Take 1 capsule by mouth daily.  . metoprolol succinate (TOPROL XL) 25 MG 24 hr tablet Take 1 tablet (25 mg total) by mouth daily.  . Misc Natural Products (BLACK CHERRY  CONCENTRATE PO) Take 1,000 mg daily by mouth.  . nitroGLYCERIN (NITROSTAT) 0.4 MG SL tablet Place 1 tablet (0.4 mg total) under the tongue every 5 (five) minutes as needed for chest pain.  . Omega-3 Fatty Acids (FISH OIL) 1000 MG CAPS Take 1,000 mg by mouth daily.   Marland Kitchen omeprazole (PRILOSEC) 20 MG capsule Take 1 capsule (20 mg total) by mouth daily.  . rosuvastatin (CRESTOR) 20 MG tablet Take 1 tablet (20 mg total) by mouth daily.  . sildenafil (REVATIO) 20 MG tablet TAKE 1 TO 2 TABLETS BY MOUTH EVERY DAY AS NEEDED  . telmisartan (MICARDIS) 20 MG tablet TAKE ONE TABLET EVERY  DAY (Patient taking differently: Take 20 mg by mouth daily.)   No facility-administered encounter medications on file as of 09/15/2020.    Review of Systems  Constitutional: Positive for fatigue. Negative for appetite change and unexpected weight change.  HENT: Negative for congestion and sinus pressure.   Respiratory: Negative for chest tightness.        Breathing stable.    Cardiovascular: Negative for chest pain, palpitations and leg swelling.  Gastrointestinal: Negative for abdominal pain, diarrhea, nausea and vomiting.  Genitourinary: Negative for difficulty urinating and dysuria.  Musculoskeletal: Negative for joint swelling and myalgias.  Skin: Negative for color change and rash.  Neurological: Negative for dizziness, light-headedness and headaches.  Psychiatric/Behavioral: Negative for agitation and dysphoric mood.       Objective:    Physical Exam Vitals reviewed.  Constitutional:      General: He is not in acute distress.    Appearance: Normal appearance. He is well-developed and well-nourished.  HENT:     Head: Normocephalic and atraumatic.     Right Ear: External ear normal.     Left Ear: External ear normal.  Eyes:     General: No scleral icterus.       Right eye: No discharge.        Left eye: No discharge.  Cardiovascular:     Rate and Rhythm: Normal rate and regular rhythm.  Pulmonary:      Effort: Pulmonary effort is normal. No respiratory distress.     Breath sounds: Normal breath sounds.  Abdominal:     General: Bowel sounds are normal.     Palpations: Abdomen is soft.     Tenderness: There is no abdominal tenderness.  Musculoskeletal:        General: No swelling, tenderness or edema.     Cervical back: Neck supple. No tenderness.  Lymphadenopathy:     Cervical: No cervical adenopathy.  Skin:    Findings: No erythema or rash.  Neurological:     Mental Status: He is alert.  Psychiatric:        Mood and Affect: Mood and affect and mood normal.        Behavior: Behavior normal.     BP 98/60   Pulse 73   Temp 98.4 F (36.9 C) (Oral)   Resp 16   Wt 158 lb 6.4 oz (71.8 kg)   SpO2 98%   BMI 25.57 kg/m  Wt Readings from Last 3 Encounters:  09/15/20 158 lb 6.4 oz (71.8 kg)  09/13/20 153 lb 1.6 oz (69.4 kg)  08/11/20 155 lb (70.3 kg)     Lab Results  Component Value Date   WBC 14.1 (H) 09/12/2020   HGB 14.8 09/12/2020   HCT 42.4 09/12/2020   PLT 304 09/12/2020   GLUCOSE 129 (H) 09/12/2020   CHOL 157 09/12/2020   TRIG 49 09/12/2020   HDL 54 09/12/2020   LDLCALC 93 09/12/2020   ALT 24 07/15/2020   AST 22 07/15/2020   NA 138 09/12/2020   K 3.8 09/12/2020   CL 108 09/12/2020   CREATININE 0.94 09/12/2020   BUN 17 09/12/2020   CO2 23 09/12/2020   TSH 2.39 03/15/2020   PSA 1.39 12/16/2017   INR 1.0 09/11/2020   HGBA1C 6.2 (H) 09/12/2020    CARDIAC CATHETERIZATION  Result Date: 09/12/2020  Prox Cx lesion is 30% stenosed.  Mid Cx lesion is 20% stenosed.  Prox LAD lesion is 20% stenosed.  Prox LAD to Mid LAD lesion is  30% stenosed.  Mid LAD to Dist LAD lesion is 50% stenosed.  Prox RCA to Mid RCA lesion is 20% stenosed.  1.  Insignificant coronary artery disease with patent stent proximal/mid RCA, 50% stenosis mid/distal LAD with muscle bridge 2.  Mildly reduced left ventricular function Recommendations 1.  Medical therapy 2.  Aggressive risk factor  modification 3.  Dual antiplatelet therapy   ECHOCARDIOGRAM COMPLETE  Result Date: 09/13/2020    ECHOCARDIOGRAM REPORT   Patient Name:   DAMIANO STAMPER [facility] Date of Exam: 09/12/2020 Medical Rec #:  [id number]    Height:       66.0 in Accession #:    [id number]   Weight:       155.0 lb Date of Birth:  [birth year]    BSA:          1.794 m Patient Age:    32 years     BP:           114/69 mmHg Patient Gender: M            HR:           69 bpm. Exam Location:  [facility] Procedure: Cardiac Doppler, Color Doppler and 2D Echo Indications:     NSTEMI I21.4  History:         Patient has no prior history of Echocardiogram examinations.                  CAD; Risk Factors:Hypertension.  Sonographer:     Alyse Low Roar Referring Phys:  [id number] IOEVOJJ AMIN Diagnosing Phys: Isaias Cowman MD IMPRESSIONS  1. Left ventricular ejection fraction, by estimation, is 60 to 65%. The left ventricle has normal function. The left ventricle has no regional wall motion abnormalities. Left ventricular diastolic parameters are consistent with Grade I diastolic dysfunction (impaired relaxation).  2. Right ventricular systolic function is normal. The right ventricular size is normal.  3. The mitral valve is normal in structure. Mild mitral valve regurgitation. No evidence of mitral stenosis.  4. The aortic valve is normal in structure. Aortic valve regurgitation is not visualized. No aortic stenosis is present.  5. The inferior vena cava is normal in size with greater than 50% respiratory variability, suggesting right atrial pressure of 3 mmHg. FINDINGS  Left Ventricle: Left ventricular ejection fraction, by estimation, is 60 to 65%. The left ventricle has normal function. The left ventricle has no regional wall motion abnormalities. The left ventricular internal cavity size was normal in size. There is  no left ventricular hypertrophy. Left ventricular diastolic parameters are consistent with Grade I diastolic dysfunction (impaired relaxation). Right  Ventricle: The right ventricular size is normal. No increase in right ventricular wall thickness. Right ventricular systolic function is normal. Left Atrium: Left atrial size was normal in size. Right Atrium: Right atrial size was normal in size. Pericardium: There is no evidence of pericardial effusion. Mitral Valve: The mitral valve is normal in structure. Mild mitral valve regurgitation. No evidence of mitral valve stenosis. Tricuspid Valve: The tricuspid valve is normal in structure. Tricuspid valve regurgitation is mild . No evidence of tricuspid stenosis. Aortic Valve: The aortic valve is normal in structure. Aortic valve regurgitation is not visualized. No aortic stenosis is present. Aortic valve peak gradient measures 4.8 mmHg. Pulmonic Valve: The pulmonic valve was normal in structure. Pulmonic valve regurgitation is not visualized. No evidence of pulmonic stenosis. Aorta: The aortic root is normal in size and structure. Venous: The inferior vena cava is normal in size  with greater than 50% respiratory variability, suggesting right atrial pressure of 3 mmHg. IAS/Shunts: No atrial level shunt detected by color flow Doppler.  LEFT VENTRICLE PLAX 2D LVIDd:         4.53 cm  Diastology LVIDs:         3.03 cm  LV e' medial:    7.51 cm/s LV PW:         0.97 cm  LV E/e' medial:  12.6 LV IVS:        0.95 cm  LV e' lateral:   6.85 cm/s LVOT diam:     1.90 cm  LV E/e' lateral: 13.8 LVOT Area:     2.84 cm  RIGHT VENTRICLE RV Mid diam:    2.84 cm RV S prime:     14.10 cm/s TAPSE (M-mode): 2.8 cm LEFT ATRIUM             Index       RIGHT ATRIUM           Index LA diam:        3.30 cm 1.84 cm/m  RA Area:     13.20 cm LA Vol (A2C):   36.3 ml 20.23 ml/m RA Volume:   30.60 ml  17.05 ml/m LA Vol (A4C):   33.8 ml 18.84 ml/m LA Biplane Vol: 35.1 ml 19.56 ml/m  AORTIC VALVE                PULMONIC VALVE AV Area (Vmax): 2.84 cm    PV Vmax:        1.14 m/s AV Vmax:        110.00 cm/s PV Peak grad:   5.2 mmHg AV Peak Grad:    4.8 mmHg    RVOT Peak grad: 2 mmHg LVOT Vmax:      110.00 cm/s  AORTA Ao Root diam: 3.10 cm MITRAL VALVE MV Area (PHT): 4.41 cm     SHUNTS MV Decel Time: 172 msec     Systemic Diam: 1.90 cm MV E velocity: 94.60 cm/s MV A velocity: 122.00 cm/s MV E/A ratio:  0.78 MV A Prime:    12.8 cm/s Isaias Cowman MD Electronically signed by Isaias Cowman MD Signature Date/Time: 09/13/2020/1:12:51 PM    Final        Assessment & Plan:   Problem List Items Addressed This Visit    CAD in native artery    Recent admission with chest pain.  Cath as outlined.  No large vessel blockage.  Elected Conservation officer, nature.  Started on aspirin and plavix.  Continues crestor.  Concern regarding myocarditis.  Started on colchicine.  Continue current medication regimen.  Is without chest pain now.  Feeling better.  Breathing better.  Has f/u with Dr Ubaldo Glassing scheduled.  Discussed with cardiology. Hold on repeating troponin - pt doing better.  Feels better.       Emphysema, unspecified ([place])    Back on anoro.  Breathing stable.  Follow.       Hyperglycemia    Low carb diet and exercise.  Follow met b and a1c.           Einar Pheasant, MD

## 2020-09-17 ENCOUNTER — Encounter: Payer: Self-pay | Admitting: Internal Medicine

## 2020-09-17 NOTE — Assessment & Plan Note (Addendum)
Recent admission with chest pain.  Cath as outlined.  No large vessel blockage.  Elected Conservation officer, nature.  Started on aspirin and plavix.  Continues crestor.  Concern regarding myocarditis.  Started on colchicine.  Continue current medication regimen.  Is without chest pain now.  Feeling better.  Breathing better.  Has f/u with Dr Ubaldo Glassing scheduled.  Discussed with cardiology. Hold on repeating troponin - pt doing better.  Feels better.  Discussed cardiac rehab.  He is agreeable.

## 2020-09-17 NOTE — Assessment & Plan Note (Signed)
Back on anoro.  Breathing stable.  Follow.

## 2020-09-17 NOTE — Assessment & Plan Note (Signed)
Low carb diet and exercise.  Follow met b and a1c.  

## 2020-09-26 DIAGNOSIS — E782 Mixed hyperlipidemia: Secondary | ICD-10-CM | POA: Diagnosis not present

## 2020-09-26 DIAGNOSIS — I25119 Atherosclerotic heart disease of native coronary artery with unspecified angina pectoris: Secondary | ICD-10-CM | POA: Diagnosis not present

## 2020-09-26 DIAGNOSIS — R0989 Other specified symptoms and signs involving the circulatory and respiratory systems: Secondary | ICD-10-CM | POA: Diagnosis not present

## 2020-09-26 DIAGNOSIS — I10 Essential (primary) hypertension: Secondary | ICD-10-CM | POA: Diagnosis not present

## 2020-09-26 DIAGNOSIS — J439 Emphysema, unspecified: Secondary | ICD-10-CM | POA: Diagnosis not present

## 2020-09-26 DIAGNOSIS — I2510 Atherosclerotic heart disease of native coronary artery without angina pectoris: Secondary | ICD-10-CM | POA: Diagnosis not present

## 2020-10-10 ENCOUNTER — Other Ambulatory Visit: Payer: Self-pay

## 2020-10-10 ENCOUNTER — Other Ambulatory Visit: Payer: PPO

## 2020-10-10 DIAGNOSIS — R310 Gross hematuria: Secondary | ICD-10-CM

## 2020-10-10 LAB — MICROSCOPIC EXAMINATION
Bacteria, UA: NONE SEEN
Epithelial Cells (non renal): NONE SEEN /hpf (ref 0–10)

## 2020-10-10 LAB — URINALYSIS, COMPLETE
Bilirubin, UA: NEGATIVE
Glucose, UA: NEGATIVE
Ketones, UA: NEGATIVE
Leukocytes,UA: NEGATIVE
Nitrite, UA: NEGATIVE
Protein,UA: NEGATIVE
Specific Gravity, UA: 1.01 (ref 1.005–1.030)
Urobilinogen, Ur: 0.2 mg/dL (ref 0.2–1.0)
pH, UA: 6.5 (ref 5.0–7.5)

## 2020-10-12 ENCOUNTER — Encounter: Payer: Self-pay | Admitting: *Deleted

## 2020-10-25 DIAGNOSIS — I25119 Atherosclerotic heart disease of native coronary artery with unspecified angina pectoris: Secondary | ICD-10-CM | POA: Diagnosis not present

## 2020-10-25 DIAGNOSIS — J439 Emphysema, unspecified: Secondary | ICD-10-CM | POA: Diagnosis not present

## 2020-10-25 DIAGNOSIS — I2510 Atherosclerotic heart disease of native coronary artery without angina pectoris: Secondary | ICD-10-CM | POA: Diagnosis not present

## 2020-10-25 DIAGNOSIS — R0989 Other specified symptoms and signs involving the circulatory and respiratory systems: Secondary | ICD-10-CM | POA: Diagnosis not present

## 2020-10-25 DIAGNOSIS — I10 Essential (primary) hypertension: Secondary | ICD-10-CM | POA: Diagnosis not present

## 2020-11-04 ENCOUNTER — Other Ambulatory Visit: Payer: Self-pay

## 2020-11-04 ENCOUNTER — Encounter: Payer: PPO | Attending: Cardiology | Admitting: *Deleted

## 2020-11-04 DIAGNOSIS — I2510 Atherosclerotic heart disease of native coronary artery without angina pectoris: Secondary | ICD-10-CM | POA: Insufficient documentation

## 2020-11-04 DIAGNOSIS — I214 Non-ST elevation (NSTEMI) myocardial infarction: Secondary | ICD-10-CM

## 2020-11-04 NOTE — Progress Notes (Signed)
Virtual orientation call completed today. he has an appointment on Date: 11/07/2020 for EP eval and gym Orientation.  Documentation of diagnosis can be found in [facility] Date: 09/26/2020.

## 2020-11-07 ENCOUNTER — Encounter: Payer: PPO | Admitting: *Deleted

## 2020-11-07 ENCOUNTER — Other Ambulatory Visit: Payer: Self-pay

## 2020-11-07 VITALS — Ht 66.1 in | Wt 159.4 lb

## 2020-11-07 DIAGNOSIS — I214 Non-ST elevation (NSTEMI) myocardial infarction: Secondary | ICD-10-CM

## 2020-11-07 DIAGNOSIS — I2510 Atherosclerotic heart disease of native coronary artery without angina pectoris: Secondary | ICD-10-CM | POA: Diagnosis not present

## 2020-11-07 NOTE — Progress Notes (Signed)
Cardiac Individual Treatment Plan  Patient Details  Name: James Peck MRN: [id number] Date of Birth: [birth year] Referring Provider:   Flowsheet Row Cardiac Rehab from 11/07/2020 in [facility] Cardiac and Pulmonary Rehab  Referring Provider Bartholome Bill MD      Initial Encounter Date:  Flowsheet Row Cardiac Rehab from 11/07/2020 in [facility] Cardiac and Pulmonary Rehab  Date 11/07/20      Visit Diagnosis: NSTEMI (non-ST elevation myocardial infarction) [facility])  Patient's Home Medications on Admission:  Current Outpatient Medications:  .  albuterol (VENTOLIN HFA) 108 (90 Base) MCG/ACT inhaler, Inhale 1-2 puffs into the lungs every 6 (six) hours as needed for wheezing or shortness of breath., Disp: , Rfl:  .  ANORO ELLIPTA 62.5-25 MCG/INH AEPB, Inhale 1 puff into the lungs daily., Disp: 90 each, Rfl: 3 .  aspirin EC 81 MG tablet, Take 81 mg by mouth daily. , Disp: , Rfl:  .  calcium carbonate (OS-CAL - DOSED IN MG OF ELEMENTAL CALCIUM) 1250 (500 CA) MG tablet, Take 1 tablet by mouth daily. , Disp: , Rfl:  .  cetirizine (ZYRTEC) 10 MG tablet, TAKE 1 TABLET BY MOUTH DAILY (Patient taking differently: Take 10 mg by mouth daily.), Disp: 90 tablet, Rfl: 1 .  chlorpheniramine (CHLOR-TRIMETON) 4 MG tablet, Take 4 mg by mouth 2 (two) times daily as needed for allergies., Disp: , Rfl:  .  CINNAMON PO, Take 1,200 mg by mouth 2 times daily at 12 noon and 4 pm., Disp: , Rfl:  .  clopidogrel (PLAVIX) 75 MG tablet, Take 1 tablet (75 mg total) by mouth daily., Disp: 30 tablet, Rfl: 0 .  colchicine 0.6 MG tablet, Take 1 tablet (0.6 mg total) by mouth daily. (Patient not taking: Reported on 11/04/2020), Disp: 30 tablet, Rfl: 2 .  dextromethorphan-guaiFENesin (MUCINEX DM) 30-600 MG 12hr tablet, Take 1 tablet by mouth 2 (two) times daily., Disp: , Rfl:  .  diclofenac sodium (VOLTAREN) 1 % GEL, Apply 2 g 2 (two) times daily topically. To affected joint., Disp: 300 g, Rfl: 11 .  fluticasone (FLONASE) 50 MCG/ACT nasal  spray, TAKE 2 SPRAYS INTO BOTH NOSTRILS DAILY, Disp: 48 g, Rfl: 3 .  GLUCOSAMINE-CHONDROITIN PO, Take 1 capsule by mouth daily., Disp: , Rfl:  .  metoprolol succinate (TOPROL XL) 25 MG 24 hr tablet, Take 1 tablet (25 mg total) by mouth daily., Disp: 30 tablet, Rfl: 0 .  Misc Natural Products (BLACK CHERRY CONCENTRATE PO), Take 1,000 mg daily by mouth., Disp: , Rfl:  .  nitroGLYCERIN (NITROSTAT) 0.4 MG SL tablet, Place 1 tablet (0.4 mg total) under the tongue every 5 (five) minutes as needed for chest pain., Disp: 30 tablet, Rfl: 0 .  Omega-3 Fatty Acids (FISH OIL) 1000 MG CAPS, Take 1,000 mg by mouth daily. , Disp: , Rfl:  .  omeprazole (PRILOSEC) 20 MG capsule, Take 1 capsule (20 mg total) by mouth daily., Disp: 90 capsule, Rfl: 2 .  rosuvastatin (CRESTOR) 20 MG tablet, Take 1 tablet (20 mg total) by mouth daily., Disp: 30 tablet, Rfl: 0 .  sildenafil (REVATIO) 20 MG tablet, TAKE 1 TO 2 TABLETS BY MOUTH EVERY DAY AS NEEDED, Disp: 60 tablet, Rfl: 0 .  telmisartan (MICARDIS) 20 MG tablet, TAKE ONE TABLET EVERY DAY (Patient taking differently: Take 20 mg by mouth daily.), Disp: 90 tablet, Rfl: 1  Past Medical History: Past Medical History:  Diagnosis Date  . Allergy   . Arthritis   . Coronary artery disease   . GERD (  gastroesophageal reflux disease)   . History of hiatal hernia   . Hypercholesteremia   . Hypertension     Tobacco Use: Social History   Tobacco Use  Smoking Status Former Smoker  . Packs/day: 1.00  . Years: 40.00  . Pack years: 40.00  . Types: Cigarettes  . Quit date: 2000  . Years since quitting: 22.2  Smokeless Tobacco Never Used    Labs: Recent Review Flowsheet Data    Labs for ITP Cardiac and Pulmonary Rehab Latest Ref Rng & Units 07/03/2019 11/06/2019 03/15/2020 07/15/2020 09/12/2020   Cholestrol 0 - 200 mg/dL 153 155 142 140 157   LDLCALC 0 - 99 mg/dL 94 93 76 79 93   HDL >40 mg/dL 43 43.10 42.30 41.10 54   Trlycerides <150 mg/dL 78 96.0 118.0 98.0 49    Hemoglobin A1c 4.8 - 5.6 % 6.1(H) 5.9 6.3 6.2 6.2(H)       Exercise Target Goals: Exercise Program Goal: Individual exercise prescription set using results from initial 6 min walk test and THRR while considering  patient's activity barriers and safety.   Exercise Prescription Goal: Initial exercise prescription builds to 30-45 minutes a day of aerobic activity, 2-3 days per week.  Home exercise guidelines will be given to patient during program as part of exercise prescription that the participant will acknowledge.   Education: Aerobic Exercise: - Group verbal and visual presentation on the components of exercise prescription. Introduces F.I.T.T principle from ACSM for exercise prescriptions.  Reviews F.I.T.T. principles of aerobic exercise including progression. Written material given at graduation.   Education: Resistance Exercise: - Group verbal and visual presentation on the components of exercise prescription. Introduces F.I.T.T principle from ACSM for exercise prescriptions  Reviews F.I.T.T. principles of resistance exercise including progression. Written material given at graduation.    Education: Exercise & Equipment Safety: - Individual verbal instruction and demonstration of equipment use and safety with use of the equipment. Flowsheet Row Cardiac Rehab from 11/07/2020 in [facility] Cardiac and Pulmonary Rehab  Date 11/07/20  Educator [facility]  Instruction Review Code 1- Verbalizes Understanding      Education: Exercise Physiology & General Exercise Guidelines: - Group verbal and written instruction with models to review the exercise physiology of the cardiovascular system and associated critical values. Provides general exercise guidelines with specific guidelines to those with heart or lung disease.    Education: Flexibility, Balance, Mind/Body Relaxation: - Group verbal and visual presentation with interactive activity on the components of exercise prescription. Introduces F.I.T.T  principle from ACSM for exercise prescriptions. Reviews F.I.T.T. principles of flexibility and balance exercise training including progression. Also discusses the mind body connection.  Reviews various relaxation techniques to help reduce and manage stress (i.e. Deep breathing, progressive muscle relaxation, and visualization). Balance handout provided to take home. Written material given at graduation.   Activity Barriers & Risk Stratification:  Activity Barriers & Cardiac Risk Stratification - 11/07/20 1035      Activity Barriers & Cardiac Risk Stratification   Activity Barriers Muscular Weakness;Deconditioning;Shortness of Breath;Joint Problems   torn menicus in left knee, previous r shoulder/rotator cuff injury   Cardiac Risk Stratification High           6 Minute Walk:  6 Minute Walk    Row Name 11/07/20 1034         6 Minute Walk   Phase Initial     Distance 1215 feet     Walk Time 6 minutes     # of Rest Breaks  0     MPH 2.3     METS 2.37     RPE 13     Perceived Dyspnea  2     VO2 Peak 8.29     Symptoms Yes (comment)     Comments SOB, "wore wrong shoes today", shoes bothered his ankle     Resting HR 68 bpm     Resting BP 122/64     Resting Oxygen Saturation  94 %     Exercise Oxygen Saturation  during 6 min walk 94 %     Max Ex. HR 103 bpm     Max Ex. BP 130/74     2 Minute Post BP 116/64            Oxygen Initial Assessment:   Oxygen Re-Evaluation:   Oxygen Discharge (Final Oxygen Re-Evaluation):   Initial Exercise Prescription:  Initial Exercise Prescription - 11/07/20 1000      Date of Initial Exercise RX and Referring Provider   Date 11/07/20    Referring Provider Bartholome Bill MD      Treadmill   MPH 2.3    Grade 0    Minutes 15    METs 2.76      Recumbant Bike   Level 2    RPM 50    Watts 10    Minutes 15    METs 2.5      NuStep   Level 2    SPM 80    Minutes 15    METs 2.5      REL-XR   Level 1    Speed 50    Minutes 15     METs 2.5      Prescription Details   Frequency (times per week) 3    Duration Progress to 30 minutes of continuous aerobic without signs/symptoms of physical distress      Intensity   THRR 40-80% of Max Heartrate 97-126    Ratings of Perceived Exertion 11-13    Perceived Dyspnea 0-4      Progression   Progression Continue to progress workloads to maintain intensity without signs/symptoms of physical distress.      Resistance Training   Training Prescription Yes    Weight 4 lb    Reps 10-15           Perform Capillary Blood Glucose checks as needed.  Exercise Prescription Changes:  Exercise Prescription Changes    Row Name 11/07/20 1000             Response to Exercise   Blood Pressure (Admit) 122/64       Blood Pressure (Exercise) 130/74       Blood Pressure (Exit) 116/64       Heart Rate (Admit) 68 bpm       Heart Rate (Exercise) 103 bpm       Heart Rate (Exit) 73 bpm       Oxygen Saturation (Admit) 94 %       Oxygen Saturation (Exercise) 94 %       Rating of Perceived Exertion (Exercise) 13       Perceived Dyspnea (Exercise) 2       Symptoms SOB, ankle aggravated from shoes       Comments walk test results              Exercise Comments:   Exercise Goals and Review:  Exercise Goals    Row Name 11/07/20 1038  Exercise Goals   Increase Physical Activity Yes       Intervention Provide advice, education, support and counseling about physical activity/exercise needs.;Develop an individualized exercise prescription for aerobic and resistive training based on initial evaluation findings, risk stratification, comorbidities and participant's personal goals.       Expected Outcomes Short Term: Attend rehab on a regular basis to increase amount of physical activity.;Long Term: Add in home exercise to make exercise part of routine and to increase amount of physical activity.;Long Term: Exercising regularly at least 3-5 days a week.       Increase  Strength and Stamina Yes       Intervention Provide advice, education, support and counseling about physical activity/exercise needs.;Develop an individualized exercise prescription for aerobic and resistive training based on initial evaluation findings, risk stratification, comorbidities and participant's personal goals.       Expected Outcomes Short Term: Increase workloads from initial exercise prescription for resistance, speed, and METs.;Short Term: Perform resistance training exercises routinely during rehab and add in resistance training at home;Long Term: Improve cardiorespiratory fitness, muscular endurance and strength as measured by increased METs and functional capacity (6MWT)       Able to understand and use rate of perceived exertion (RPE) scale Yes       Intervention Provide education and explanation on how to use RPE scale       Expected Outcomes Short Term: Able to use RPE daily in rehab to express subjective intensity level;Long Term:  Able to use RPE to guide intensity level when exercising independently       Able to understand and use Dyspnea scale Yes       Intervention Provide education and explanation on how to use Dyspnea scale       Expected Outcomes Short Term: Able to use Dyspnea scale daily in rehab to express subjective sense of shortness of breath during exertion;Long Term: Able to use Dyspnea scale to guide intensity level when exercising independently       Knowledge and understanding of Target Heart Rate Range (THRR) Yes       Intervention Provide education and explanation of THRR including how the numbers were predicted and where they are located for reference       Expected Outcomes Long Term: Able to use THRR to govern intensity when exercising independently;Short Term: Able to use daily as guideline for intensity in rehab;Short Term: Able to state/look up THRR       Able to check pulse independently Yes       Intervention Provide education and demonstration on how  to check pulse in carotid and radial arteries.;Review the importance of being able to check your own pulse for safety during independent exercise       Expected Outcomes Short Term: Able to explain why pulse checking is important during independent exercise;Long Term: Able to check pulse independently and accurately       Understanding of Exercise Prescription Yes       Intervention Provide education, explanation, and written materials on patient's individual exercise prescription       Expected Outcomes Short Term: Able to explain program exercise prescription;Long Term: Able to explain home exercise prescription to exercise independently              Exercise Goals Re-Evaluation :   Discharge Exercise Prescription (Final Exercise Prescription Changes):  Exercise Prescription Changes - 11/07/20 1000      Response to Exercise   Blood Pressure (Admit) 122/64  Blood Pressure (Exercise) 130/74    Blood Pressure (Exit) 116/64    Heart Rate (Admit) 68 bpm    Heart Rate (Exercise) 103 bpm    Heart Rate (Exit) 73 bpm    Oxygen Saturation (Admit) 94 %    Oxygen Saturation (Exercise) 94 %    Rating of Perceived Exertion (Exercise) 13    Perceived Dyspnea (Exercise) 2    Symptoms SOB, ankle aggravated from shoes    Comments walk test results           Nutrition:  Target Goals: Understanding of nutrition guidelines, daily intake of sodium 1500mg , cholesterol 200mg , calories 30% from fat and 7% or less from saturated fats, daily to have 5 or more servings of fruits and vegetables.  Education: All About Nutrition: -Group instruction provided by verbal, written material, interactive activities, discussions, models, and posters to present general guidelines for heart healthy nutrition including fat, fiber, MyPlate, the role of sodium in heart healthy nutrition, utilization of the nutrition label, and utilization of this knowledge for meal planning. Follow up email sent as well. Written  material given at graduation. Flowsheet Row Cardiac Rehab from 11/07/2020 in [facility] Cardiac and Pulmonary Rehab  Education need identified 11/07/20      Biometrics:  Pre Biometrics - 11/07/20 1039      Pre Biometrics   Height 5' 6.1" (1.679 m)    Weight 159 lb 6.4 oz (72.3 kg)    BMI (Calculated) 25.65    Single Leg Stand 30 seconds            Nutrition Therapy Plan and Nutrition Goals:   Nutrition Assessments:  MEDIFICTS Score Key:  ?70 Need to make dietary changes   40-70 Heart Healthy Diet  ? 40 Therapeutic Level Cholesterol Diet  Flowsheet Row Cardiac Rehab from 11/07/2020 in [facility] Cardiac and Pulmonary Rehab  Picture Your Plate Total Score on Admission 72     Picture Your Plate Scores:  <03 Unhealthy dietary pattern with much room for improvement.  41-50 Dietary pattern unlikely to meet recommendations for good health and room for improvement.  51-60 More healthful dietary pattern, with some room for improvement.   >60 Healthy dietary pattern, although there may be some specific behaviors that could be improved.    Nutrition Goals Re-Evaluation:   Nutrition Goals Discharge (Final Nutrition Goals Re-Evaluation):   Psychosocial: Target Goals: Acknowledge presence or absence of significant depression and/or stress, maximize coping skills, provide positive support system. Participant is able to verbalize types and ability to use techniques and skills needed for reducing stress and depression.   Education: Stress, Anxiety, and Depression - Group verbal and visual presentation to define topics covered.  Reviews how body is impacted by stress, anxiety, and depression.  Also discusses healthy ways to reduce stress and to treat/manage anxiety and depression.  Written material given at graduation.   Education: Sleep Hygiene -Provides group verbal and written instruction about how sleep can affect your health.  Define sleep hygiene, discuss sleep cycles and impact  of sleep habits. Review good sleep hygiene tips.    Initial Review & Psychosocial Screening:  Initial Psych Review & Screening - 11/04/20 1012      Initial Review   Current issues with None Identified      Family Dynamics   Good Support System? Yes   wife and 2 kidsd live close by with their families, siblings     Barriers   Psychosocial barriers to participate in program There are no  identifiable barriers or psychosocial needs.;The patient should benefit from training in stress management and relaxation.      Screening Interventions   Interventions Encouraged to exercise    Expected Outcomes Short Term goal: Utilizing psychosocial counselor, staff and physician to assist with identification of specific Stressors or current issues interfering with healing process. Setting desired goal for each stressor or current issue identified.;Long Term Goal: Stressors or current issues are controlled or eliminated.;Short Term goal: Identification and review with participant of any Quality of Life or Depression concerns found by scoring the questionnaire.;Long Term goal: The participant improves quality of Life and PHQ9 Scores as seen by post scores and/or verbalization of changes           Quality of Life Scores:   Quality of Life - 11/07/20 1039      Quality of Life   Select Quality of Life      Quality of Life Scores   Health/Function Pre 23.07 %    Socioeconomic Pre 26.57 %    Psych/Spiritual Pre 29.14 %    Family Pre 28.3 %    GLOBAL Pre 25.81 %          Scores of 19 and below usually indicate a poorer quality of life in these areas.  A difference of  2-3 points is a clinically meaningful difference.  A difference of 2-3 points in the total score of the Quality of Life Index has been associated with significant improvement in overall quality of life, self-image, physical symptoms, and general health in studies assessing change in quality of life.  PHQ-9: Recent Review Flowsheet Data     Depression screen [facility] 2/9 11/07/2020 07/01/2020 06/30/2019 02/27/2019 01/29/2019   Decreased Interest 1 0 0 0 0   Down, Depressed, Hopeless 0 0 0 0 0   PHQ - 2 Score 1 0 0 0 0   Altered sleeping 1 - - - 0   Tired, decreased energy 1 - - - 0   Change in appetite 0 - - - 0   Feeling bad or failure about yourself  0 - - - 0   Trouble concentrating 1 - - - 0   Moving slowly or fidgety/restless 2 - - - 0   Suicidal thoughts 0 - - - 0   PHQ-9 Score 6 - - - 0   Difficult doing work/chores Not difficult at all - - - Not difficult at all     Interpretation of Total Score  Total Score Depression Severity:  1-4 = Minimal depression, 5-9 = Mild depression, 10-14 = Moderate depression, 15-19 = Moderately severe depression, 20-27 = Severe depression   Psychosocial Evaluation and Intervention:  Psychosocial Evaluation - 11/04/20 1024      Psychosocial Evaluation & Interventions   Interventions Encouraged to exercise with the program and follow exercise prescription    Comments Sigifredo has no barriers to attending the program. He is ready to start. He lives with his wife of 21 years. His 2 sons and their families live 1 and 2 doors down from his home. His entire family is is support system as well as his siblings and their families. He likes to work with old cars, has always raised a garden. His wife sews.  He said that keeps them busy. He states he has no stress, depression nor aniexty at this time. He should do well in the program.    Expected Outcomes STG Daved attends all scheduled sessions.  He continues to live stress  and depression free.  LTG Tayari is able to continue any lifestyle changes learned in the program.    Continue Psychosocial Services  Follow up required by staff           Psychosocial Re-Evaluation:   Psychosocial Discharge (Final Psychosocial Re-Evaluation):   Vocational Rehabilitation: Provide vocational rehab assistance to qualifying candidates.   Vocational Rehab Evaluation  & Intervention:  Vocational Rehab - 11/04/20 1028      Initial Vocational Rehab Evaluation & Intervention   Assessment shows need for Vocational Rehabilitation No           Education: Education Goals: Education classes will be provided on a variety of topics geared toward better understanding of heart health and risk factor modification. Participant will state understanding/return demonstration of topics presented as noted by education test scores.  Learning Barriers/Preferences:   General Cardiac Education Topics:  AED/CPR: - Group verbal and written instruction with the use of models to demonstrate the basic use of the AED with the basic ABC's of resuscitation.   Anatomy and Cardiac Procedures: - Group verbal and visual presentation and models provide information about basic cardiac anatomy and function. Reviews the testing methods done to diagnose heart disease and the outcomes of the test results. Describes the treatment choices: Medical Management, Angioplasty, or Coronary Bypass Surgery for treating various heart conditions including Myocardial Infarction, Angina, Valve Disease, and Cardiac Arrhythmias.  Written material given at graduation.   Medication Safety: - Group verbal and visual instruction to review commonly prescribed medications for heart and lung disease. Reviews the medication, class of the drug, and side effects. Includes the steps to properly store meds and maintain the prescription regimen.  Written material given at graduation.   Intimacy: - Group verbal instruction through game format to discuss how heart and lung disease can affect sexual intimacy. Written material given at graduation..   Know Your Numbers and Heart Failure: - Group verbal and visual instruction to discuss disease risk factors for cardiac and pulmonary disease and treatment options.  Reviews associated critical values for Overweight/Obesity, Hypertension, Cholesterol, and Diabetes.   Discusses basics of heart failure: signs/symptoms and treatments.  Introduces Heart Failure Zone chart for action plan for heart failure.  Written material given at graduation.   Infection Prevention: - Provides verbal and written material to individual with discussion of infection control including proper hand washing and proper equipment cleaning during exercise session. Flowsheet Row Cardiac Rehab from 11/07/2020 in [facility] Cardiac and Pulmonary Rehab  Date 11/07/20  Educator [facility]  Instruction Review Code 1- Verbalizes Understanding      Falls Prevention: - Provides verbal and written material to individual with discussion of falls prevention and safety. Flowsheet Row Cardiac Rehab from 11/07/2020 in [facility] Cardiac and Pulmonary Rehab  Date 11/04/20  Educator SB  Instruction Review Code 1- Verbalizes Understanding      Other: -Provides group and verbal instruction on various topics (see comments)   Knowledge Questionnaire Score:  Knowledge Questionnaire Score - 11/07/20 1041      Knowledge Questionnaire Score   Pre Score 25/26 Education Focus: Nutrition           Core Components/Risk Factors/Patient Goals at Admission:  Personal Goals and Risk Factors at Admission - 11/07/20 1041      Core Components/Risk Factors/Patient Goals on Admission    Weight Management Yes;Weight Loss    Intervention Weight Management: Provide education and appropriate resources to help participant work on and attain dietary goals.;Weight Management: Develop a combined nutrition  and exercise program designed to reach desired caloric intake, while maintaining appropriate intake of nutrient and fiber, sodium and fats, and appropriate energy expenditure required for the weight goal.;Weight Management/Obesity: Establish reasonable short term and long term weight goals.    Admit Weight 159 lb 6.4 oz (72.3 kg)    Goal Weight: Short Term 155 lb (70.3 kg)    Goal Weight: Long Term 150 lb (68 kg)    Expected  Outcomes Short Term: Continue to assess and modify interventions until short term weight is achieved;Long Term: Adherence to nutrition and physical activity/exercise program aimed toward attainment of established weight goal;Understanding recommendations for meals to include 15-35% energy as protein, 25-35% energy from fat, 35-60% energy from carbohydrates, less than 200mg  of dietary cholesterol, 20-35 gm of total fiber daily;Weight Loss: Understanding of general recommendations for a balanced deficit meal plan, which promotes 1-2 lb weight loss per week and includes a negative energy balance of [phone] kcal/d    Hypertension Yes    Intervention Provide education on lifestyle modifcations including regular physical activity/exercise, weight management, moderate sodium restriction and increased consumption of fresh fruit, vegetables, and low fat dairy, alcohol moderation, and smoking cessation.;Monitor prescription use compliance.    Expected Outcomes Short Term: Continued assessment and intervention until BP is < 140/90mm HG in hypertensive participants. < 130/64mm HG in hypertensive participants with diabetes, heart failure or chronic kidney disease.;Long Term: Maintenance of blood pressure at goal levels.    Lipids Yes    Intervention Provide education and support for participant on nutrition & aerobic/resistive exercise along with prescribed medications to achieve LDL 70mg , HDL >40mg .    Expected Outcomes Short Term: Participant states understanding of desired cholesterol values and is compliant with medications prescribed. Participant is following exercise prescription and nutrition guidelines.;Long Term: Cholesterol controlled with medications as prescribed, with individualized exercise RX and with personalized nutrition plan. Value goals: LDL < 70mg , HDL > 40 mg.           Education:Diabetes - Individual verbal and written instruction to review signs/symptoms of diabetes, desired ranges of  glucose level fasting, after meals and with exercise. Acknowledge that pre and post exercise glucose checks will be done for 3 sessions at entry of program.   Core Components/Risk Factors/Patient Goals Review:    Core Components/Risk Factors/Patient Goals at Discharge (Final Review):    ITP Comments:  ITP Comments    Row Name 11/04/20 1029 11/07/20 1034         ITP Comments Virtual orientation call completed today. he has an appointment on Date: 11/07/2020 for EP eval and gym Orientation.  Documentation of diagnosis can be found in [facility] Date: 09/26/2020. Completed 6MWT and gym orientation. Initial ITP created and sent for review to Dr. Emily Filbert, Medical Director.             Comments: Initial ITP

## 2020-11-07 NOTE — Patient Instructions (Signed)
Patient Instructions  Patient Details  Name: James Peck MRN: [id number] Date of Birth: [birth year] Referring Provider:  Teodoro Spray, MD  Below are your personal goals for exercise, nutrition, and risk factors. Our goal is to help you stay on track towards obtaining and maintaining these goals. We will be discussing your progress on these goals with you throughout the program.  Initial Exercise Prescription:  Initial Exercise Prescription - 11/07/20 1000      Date of Initial Exercise RX and Referring Provider   Date 11/07/20    Referring Provider Bartholome Bill MD      Treadmill   MPH 2.3    Grade 0    Minutes 15    METs 2.76      Recumbant Bike   Level 2    RPM 50    Watts 10    Minutes 15    METs 2.5      NuStep   Level 2    SPM 80    Minutes 15    METs 2.5      REL-XR   Level 1    Speed 50    Minutes 15    METs 2.5      Prescription Details   Frequency (times per week) 3    Duration Progress to 30 minutes of continuous aerobic without signs/symptoms of physical distress      Intensity   THRR 40-80% of Max Heartrate 97-126    Ratings of Perceived Exertion 11-13    Perceived Dyspnea 0-4      Progression   Progression Continue to progress workloads to maintain intensity without signs/symptoms of physical distress.      Resistance Training   Training Prescription Yes    Weight 4 lb    Reps 10-15           Exercise Goals: Frequency: Be able to perform aerobic exercise two to three times per week in program working toward 2-5 days per week of home exercise.  Intensity: Work with a perceived exertion of 11 (fairly light) - 15 (hard) while following your exercise prescription.  We will make changes to your prescription with you as you progress through the program.   Duration: Be able to do 30 to 45 minutes of continuous aerobic exercise in addition to a 5 minute warm-up and a 5 minute cool-down routine.   Nutrition Goals: Your personal nutrition  goals will be established when you do your nutrition analysis with the dietician.  The following are general nutrition guidelines to follow: Cholesterol < 200mg /day Sodium < 1500mg /day Fiber: Men over 50 yrs - 30 grams per day  Personal Goals:  Personal Goals and Risk Factors at Admission - 11/07/20 1041      Core Components/Risk Factors/Patient Goals on Admission    Weight Management Yes;Weight Loss    Intervention Weight Management: Provide education and appropriate resources to help participant work on and attain dietary goals.;Weight Management: Develop a combined nutrition and exercise program designed to reach desired caloric intake, while maintaining appropriate intake of nutrient and fiber, sodium and fats, and appropriate energy expenditure required for the weight goal.;Weight Management/Obesity: Establish reasonable short term and long term weight goals.    Admit Weight 159 lb 6.4 oz (72.3 kg)    Goal Weight: Short Term 155 lb (70.3 kg)    Goal Weight: Long Term 150 lb (68 kg)    Expected Outcomes Short Term: Continue to assess and modify interventions until short term weight is  achieved;Long Term: Adherence to nutrition and physical activity/exercise program aimed toward attainment of established weight goal;Understanding recommendations for meals to include 15-35% energy as protein, 25-35% energy from fat, 35-60% energy from carbohydrates, less than 200mg  of dietary cholesterol, 20-35 gm of total fiber daily;Weight Loss: Understanding of general recommendations for a balanced deficit meal plan, which promotes 1-2 lb weight loss per week and includes a negative energy balance of [phone] kcal/d    Hypertension Yes    Intervention Provide education on lifestyle modifcations including regular physical activity/exercise, weight management, moderate sodium restriction and increased consumption of fresh fruit, vegetables, and low fat dairy, alcohol moderation, and smoking cessation.;Monitor  prescription use compliance.    Expected Outcomes Short Term: Continued assessment and intervention until BP is < 140/43mm HG in hypertensive participants. < 130/35mm HG in hypertensive participants with diabetes, heart failure or chronic kidney disease.;Long Term: Maintenance of blood pressure at goal levels.    Lipids Yes    Intervention Provide education and support for participant on nutrition & aerobic/resistive exercise along with prescribed medications to achieve LDL 70mg , HDL >40mg .    Expected Outcomes Short Term: Participant states understanding of desired cholesterol values and is compliant with medications prescribed. Participant is following exercise prescription and nutrition guidelines.;Long Term: Cholesterol controlled with medications as prescribed, with individualized exercise RX and with personalized nutrition plan. Value goals: LDL < 70mg , HDL > 40 mg.           Tobacco Use Initial Evaluation: Social History   Tobacco Use  Smoking Status Former Smoker  . Packs/day: 1.00  . Years: 40.00  . Pack years: 40.00  . Types: Cigarettes  . Quit date: 2000  . Years since quitting: 22.2  Smokeless Tobacco Never Used    Exercise Goals and Review:  Exercise Goals    Row Name 11/07/20 1038             Exercise Goals   Increase Physical Activity Yes       Intervention Provide advice, education, support and counseling about physical activity/exercise needs.;Develop an individualized exercise prescription for aerobic and resistive training based on initial evaluation findings, risk stratification, comorbidities and participant's personal goals.       Expected Outcomes Short Term: Attend rehab on a regular basis to increase amount of physical activity.;Long Term: Add in home exercise to make exercise part of routine and to increase amount of physical activity.;Long Term: Exercising regularly at least 3-5 days a week.       Increase Strength and Stamina Yes       Intervention  Provide advice, education, support and counseling about physical activity/exercise needs.;Develop an individualized exercise prescription for aerobic and resistive training based on initial evaluation findings, risk stratification, comorbidities and participant's personal goals.       Expected Outcomes Short Term: Increase workloads from initial exercise prescription for resistance, speed, and METs.;Short Term: Perform resistance training exercises routinely during rehab and add in resistance training at home;Long Term: Improve cardiorespiratory fitness, muscular endurance and strength as measured by increased METs and functional capacity (6MWT)       Able to understand and use rate of perceived exertion (RPE) scale Yes       Intervention Provide education and explanation on how to use RPE scale       Expected Outcomes Short Term: Able to use RPE daily in rehab to express subjective intensity level;Long Term:  Able to use RPE to guide intensity level when exercising independently  Able to understand and use Dyspnea scale Yes       Intervention Provide education and explanation on how to use Dyspnea scale       Expected Outcomes Short Term: Able to use Dyspnea scale daily in rehab to express subjective sense of shortness of breath during exertion;Long Term: Able to use Dyspnea scale to guide intensity level when exercising independently       Knowledge and understanding of Target Heart Rate Range (THRR) Yes       Intervention Provide education and explanation of THRR including how the numbers were predicted and where they are located for reference       Expected Outcomes Long Term: Able to use THRR to govern intensity when exercising independently;Short Term: Able to use daily as guideline for intensity in rehab;Short Term: Able to state/look up THRR       Able to check pulse independently Yes       Intervention Provide education and demonstration on how to check pulse in carotid and radial  arteries.;Review the importance of being able to check your own pulse for safety during independent exercise       Expected Outcomes Short Term: Able to explain why pulse checking is important during independent exercise;Long Term: Able to check pulse independently and accurately       Understanding of Exercise Prescription Yes       Intervention Provide education, explanation, and written materials on patient's individual exercise prescription       Expected Outcomes Short Term: Able to explain program exercise prescription;Long Term: Able to explain home exercise prescription to exercise independently              Copy of goals given to participant.

## 2020-11-16 ENCOUNTER — Encounter: Payer: PPO | Admitting: *Deleted

## 2020-11-16 ENCOUNTER — Other Ambulatory Visit: Payer: Self-pay

## 2020-11-16 ENCOUNTER — Encounter: Payer: Self-pay | Admitting: *Deleted

## 2020-11-16 DIAGNOSIS — I214 Non-ST elevation (NSTEMI) myocardial infarction: Secondary | ICD-10-CM

## 2020-11-16 DIAGNOSIS — I2510 Atherosclerotic heart disease of native coronary artery without angina pectoris: Secondary | ICD-10-CM | POA: Diagnosis not present

## 2020-11-16 NOTE — Progress Notes (Signed)
Cardiac Individual Treatment Plan  Patient Details  Name: James Peck MRN: [id number] Date of Birth: Dec 15, 1940 Referring Provider:   Flowsheet Row Cardiac Rehab from 11/07/2020 in [facility] Cardiac and Pulmonary Rehab  Referring Provider Bartholome Bill MD      Initial Encounter Date:  Flowsheet Row Cardiac Rehab from 11/07/2020 in [facility] Cardiac and Pulmonary Rehab  Date 11/07/20      Visit Diagnosis: NSTEMI (non-ST elevation myocardial infarction) [facility])  Patient's Home Medications on Admission:  Current Outpatient Medications:  .  albuterol (VENTOLIN HFA) 108 (90 Base) MCG/ACT inhaler, Inhale 1-2 puffs into the lungs every 6 (six) hours as needed for wheezing or shortness of breath., Disp: , Rfl:  .  ANORO ELLIPTA 62.5-25 MCG/INH AEPB, Inhale 1 puff into the lungs daily., Disp: 90 each, Rfl: 3 .  aspirin EC 81 MG tablet, Take 81 mg by mouth daily. , Disp: , Rfl:  .  calcium carbonate (OS-CAL - DOSED IN MG OF ELEMENTAL CALCIUM) 1250 (500 CA) MG tablet, Take 1 tablet by mouth daily. , Disp: , Rfl:  .  cetirizine (ZYRTEC) 10 MG tablet, TAKE 1 TABLET BY MOUTH DAILY (Patient taking differently: Take 10 mg by mouth daily.), Disp: 90 tablet, Rfl: 1 .  chlorpheniramine (CHLOR-TRIMETON) 4 MG tablet, Take 4 mg by mouth 2 (two) times daily as needed for allergies., Disp: , Rfl:  .  CINNAMON PO, Take 1,200 mg by mouth 2 times daily at 12 noon and 4 pm., Disp: , Rfl:  .  clopidogrel (PLAVIX) 75 MG tablet, Take 1 tablet (75 mg total) by mouth daily., Disp: 30 tablet, Rfl: 0 .  colchicine 0.6 MG tablet, Take 1 tablet (0.6 mg total) by mouth daily. (Patient not taking: Reported on 11/04/2020), Disp: 30 tablet, Rfl: 2 .  dextromethorphan-guaiFENesin (MUCINEX DM) 30-600 MG 12hr tablet, Take 1 tablet by mouth 2 (two) times daily., Disp: , Rfl:  .  diclofenac sodium (VOLTAREN) 1 % GEL, Apply 2 g 2 (two) times daily topically. To affected joint., Disp: 300 g, Rfl: 11 .  fluticasone (FLONASE) 50 MCG/ACT nasal  spray, TAKE 2 SPRAYS INTO BOTH NOSTRILS DAILY, Disp: 48 g, Rfl: 3 .  GLUCOSAMINE-CHONDROITIN PO, Take 1 capsule by mouth daily., Disp: , Rfl:  .  metoprolol succinate (TOPROL XL) 25 MG 24 hr tablet, Take 1 tablet (25 mg total) by mouth daily., Disp: 30 tablet, Rfl: 0 .  Misc Natural Products (BLACK CHERRY CONCENTRATE PO), Take 1,000 mg daily by mouth., Disp: , Rfl:  .  nitroGLYCERIN (NITROSTAT) 0.4 MG SL tablet, Place 1 tablet (0.4 mg total) under the tongue every 5 (five) minutes as needed for chest pain., Disp: 30 tablet, Rfl: 0 .  Omega-3 Fatty Acids (FISH OIL) 1000 MG CAPS, Take 1,000 mg by mouth daily. , Disp: , Rfl:  .  omeprazole (PRILOSEC) 20 MG capsule, Take 1 capsule (20 mg total) by mouth daily., Disp: 90 capsule, Rfl: 2 .  rosuvastatin (CRESTOR) 20 MG tablet, Take 1 tablet (20 mg total) by mouth daily., Disp: 30 tablet, Rfl: 0 .  sildenafil (REVATIO) 20 MG tablet, TAKE 1 TO 2 TABLETS BY MOUTH EVERY DAY AS NEEDED, Disp: 60 tablet, Rfl: 0 .  telmisartan (MICARDIS) 20 MG tablet, TAKE ONE TABLET EVERY DAY (Patient taking differently: Take 20 mg by mouth daily.), Disp: 90 tablet, Rfl: 1  Past Medical History: Past Medical History:  Diagnosis Date  . Allergy   . Arthritis   . Coronary artery disease   . GERD (  gastroesophageal reflux disease)   . History of hiatal hernia   . Hypercholesteremia   . Hypertension     Tobacco Use: Social History   Tobacco Use  Smoking Status Former Smoker  . Packs/day: 1.00  . Years: 40.00  . Pack years: 40.00  . Types: Cigarettes  . Quit date: 2000  . Years since quitting: 22.3  Smokeless Tobacco Never Used    Labs: Recent Review Flowsheet Data    Labs for ITP Cardiac and Pulmonary Rehab Latest Ref Rng & Units 07/03/2019 11/06/2019 03/15/2020 07/15/2020 09/12/2020   Cholestrol 0 - 200 mg/dL 153 155 142 140 157   LDLCALC 0 - 99 mg/dL 94 93 76 79 93   HDL >40 mg/dL 43 43.10 42.30 41.10 54   Trlycerides <150 mg/dL 78 96.0 118.0 98.0 49    Hemoglobin A1c 4.8 - 5.6 % 6.1(H) 5.9 6.3 6.2 6.2(H)       Exercise Target Goals: Exercise Program Goal: Individual exercise prescription set using results from initial 6 min walk test and THRR while considering  patient's activity barriers and safety.   Exercise Prescription Goal: Initial exercise prescription builds to 30-45 minutes a day of aerobic activity, 2-3 days per week.  Home exercise guidelines will be given to patient during program as part of exercise prescription that the participant will acknowledge.   Education: Aerobic Exercise: - Group verbal and visual presentation on the components of exercise prescription. Introduces F.I.T.T principle from ACSM for exercise prescriptions.  Reviews F.I.T.T. principles of aerobic exercise including progression. Written material given at graduation.   Education: Resistance Exercise: - Group verbal and visual presentation on the components of exercise prescription. Introduces F.I.T.T principle from ACSM for exercise prescriptions  Reviews F.I.T.T. principles of resistance exercise including progression. Written material given at graduation.    Education: Exercise & Equipment Safety: - Individual verbal instruction and demonstration of equipment use and safety with use of the equipment. Flowsheet Row Cardiac Rehab from 11/07/2020 in [facility] Cardiac and Pulmonary Rehab  Date 11/07/20  Educator [facility]  Instruction Review Code 1- Verbalizes Understanding      Education: Exercise Physiology & General Exercise Guidelines: - Group verbal and written instruction with models to review the exercise physiology of the cardiovascular system and associated critical values. Provides general exercise guidelines with specific guidelines to those with heart or lung disease.    Education: Flexibility, Balance, Mind/Body Relaxation: - Group verbal and visual presentation with interactive activity on the components of exercise prescription. Introduces F.I.T.T  principle from ACSM for exercise prescriptions. Reviews F.I.T.T. principles of flexibility and balance exercise training including progression. Also discusses the mind body connection.  Reviews various relaxation techniques to help reduce and manage stress (i.e. Deep breathing, progressive muscle relaxation, and visualization). Balance handout provided to take home. Written material given at graduation.   Activity Barriers & Risk Stratification:  Activity Barriers & Cardiac Risk Stratification - 11/07/20 1035      Activity Barriers & Cardiac Risk Stratification   Activity Barriers Muscular Weakness;Deconditioning;Shortness of Breath;Joint Problems   torn menicus in left knee, previous r shoulder/rotator cuff injury   Cardiac Risk Stratification High           6 Minute Walk:  6 Minute Walk    Row Name 11/07/20 1034         6 Minute Walk   Phase Initial     Distance 1215 feet     Walk Time 6 minutes     # of Rest Breaks  0     MPH 2.3     METS 2.37     RPE 13     Perceived Dyspnea  2     VO2 Peak 8.29     Symptoms Yes (comment)     Comments SOB, "wore wrong shoes today", shoes bothered his ankle     Resting HR 68 bpm     Resting BP 122/64     Resting Oxygen Saturation  94 %     Exercise Oxygen Saturation  during 6 min walk 94 %     Max Ex. HR 103 bpm     Max Ex. BP 130/74     2 Minute Post BP 116/64            Oxygen Initial Assessment:   Oxygen Re-Evaluation:   Oxygen Discharge (Final Oxygen Re-Evaluation):   Initial Exercise Prescription:  Initial Exercise Prescription - 11/07/20 1000      Date of Initial Exercise RX and Referring Provider   Date 11/07/20    Referring Provider Bartholome Bill MD      Treadmill   MPH 2.3    Grade 0    Minutes 15    METs 2.76      Recumbant Bike   Level 2    RPM 50    Watts 10    Minutes 15    METs 2.5      NuStep   Level 2    SPM 80    Minutes 15    METs 2.5      REL-XR   Level 1    Speed 50    Minutes 15     METs 2.5      Prescription Details   Frequency (times per week) 3    Duration Progress to 30 minutes of continuous aerobic without signs/symptoms of physical distress      Intensity   THRR 40-80% of Max Heartrate 97-126    Ratings of Perceived Exertion 11-13    Perceived Dyspnea 0-4      Progression   Progression Continue to progress workloads to maintain intensity without signs/symptoms of physical distress.      Resistance Training   Training Prescription Yes    Weight 4 lb    Reps 10-15           Perform Capillary Blood Glucose checks as needed.  Exercise Prescription Changes:  Exercise Prescription Changes    Row Name 11/07/20 1000             Response to Exercise   Blood Pressure (Admit) 122/64       Blood Pressure (Exercise) 130/74       Blood Pressure (Exit) 116/64       Heart Rate (Admit) 68 bpm       Heart Rate (Exercise) 103 bpm       Heart Rate (Exit) 73 bpm       Oxygen Saturation (Admit) 94 %       Oxygen Saturation (Exercise) 94 %       Rating of Perceived Exertion (Exercise) 13       Perceived Dyspnea (Exercise) 2       Symptoms SOB, ankle aggravated from shoes       Comments walk test results              Exercise Comments:   Exercise Goals and Review:  Exercise Goals    Row Name 11/07/20 1038  Exercise Goals   Increase Physical Activity Yes       Intervention Provide advice, education, support and counseling about physical activity/exercise needs.;Develop an individualized exercise prescription for aerobic and resistive training based on initial evaluation findings, risk stratification, comorbidities and participant's personal goals.       Expected Outcomes Short Term: Attend rehab on a regular basis to increase amount of physical activity.;Long Term: Add in home exercise to make exercise part of routine and to increase amount of physical activity.;Long Term: Exercising regularly at least 3-5 days a week.       Increase  Strength and Stamina Yes       Intervention Provide advice, education, support and counseling about physical activity/exercise needs.;Develop an individualized exercise prescription for aerobic and resistive training based on initial evaluation findings, risk stratification, comorbidities and participant's personal goals.       Expected Outcomes Short Term: Increase workloads from initial exercise prescription for resistance, speed, and METs.;Short Term: Perform resistance training exercises routinely during rehab and add in resistance training at home;Long Term: Improve cardiorespiratory fitness, muscular endurance and strength as measured by increased METs and functional capacity (6MWT)       Able to understand and use rate of perceived exertion (RPE) scale Yes       Intervention Provide education and explanation on how to use RPE scale       Expected Outcomes Short Term: Able to use RPE daily in rehab to express subjective intensity level;Long Term:  Able to use RPE to guide intensity level when exercising independently       Able to understand and use Dyspnea scale Yes       Intervention Provide education and explanation on how to use Dyspnea scale       Expected Outcomes Short Term: Able to use Dyspnea scale daily in rehab to express subjective sense of shortness of breath during exertion;Long Term: Able to use Dyspnea scale to guide intensity level when exercising independently       Knowledge and understanding of Target Heart Rate Range (THRR) Yes       Intervention Provide education and explanation of THRR including how the numbers were predicted and where they are located for reference       Expected Outcomes Long Term: Able to use THRR to govern intensity when exercising independently;Short Term: Able to use daily as guideline for intensity in rehab;Short Term: Able to state/look up THRR       Able to check pulse independently Yes       Intervention Provide education and demonstration on how  to check pulse in carotid and radial arteries.;Review the importance of being able to check your own pulse for safety during independent exercise       Expected Outcomes Short Term: Able to explain why pulse checking is important during independent exercise;Long Term: Able to check pulse independently and accurately       Understanding of Exercise Prescription Yes       Intervention Provide education, explanation, and written materials on patient's individual exercise prescription       Expected Outcomes Short Term: Able to explain program exercise prescription;Long Term: Able to explain home exercise prescription to exercise independently              Exercise Goals Re-Evaluation :   Discharge Exercise Prescription (Final Exercise Prescription Changes):  Exercise Prescription Changes - 11/07/20 1000      Response to Exercise   Blood Pressure (Admit) 122/64  Blood Pressure (Exercise) 130/74    Blood Pressure (Exit) 116/64    Heart Rate (Admit) 68 bpm    Heart Rate (Exercise) 103 bpm    Heart Rate (Exit) 73 bpm    Oxygen Saturation (Admit) 94 %    Oxygen Saturation (Exercise) 94 %    Rating of Perceived Exertion (Exercise) 13    Perceived Dyspnea (Exercise) 2    Symptoms SOB, ankle aggravated from shoes    Comments walk test results           Nutrition:  Target Goals: Understanding of nutrition guidelines, daily intake of sodium 1500mg , cholesterol 200mg , calories 30% from fat and 7% or less from saturated fats, daily to have 5 or more servings of fruits and vegetables.  Education: All About Nutrition: -Group instruction provided by verbal, written material, interactive activities, discussions, models, and posters to present general guidelines for heart healthy nutrition including fat, fiber, MyPlate, the role of sodium in heart healthy nutrition, utilization of the nutrition label, and utilization of this knowledge for meal planning. Follow up email sent as well. Written  material given at graduation. Flowsheet Row Cardiac Rehab from 11/07/2020 in [facility] Cardiac and Pulmonary Rehab  Education need identified 11/07/20      Biometrics:  Pre Biometrics - 11/07/20 1039      Pre Biometrics   Height 5' 6.1" (1.679 m)    Weight 159 lb 6.4 oz (72.3 kg)    BMI (Calculated) 25.65    Single Leg Stand 30 seconds            Nutrition Therapy Plan and Nutrition Goals:   Nutrition Assessments:  MEDIFICTS Score Key:  ?70 Need to make dietary changes   40-70 Heart Healthy Diet  ? 40 Therapeutic Level Cholesterol Diet  Flowsheet Row Cardiac Rehab from 11/07/2020 in [facility] Cardiac and Pulmonary Rehab  Picture Your Plate Total Score on Admission 72     Picture Your Plate Scores:  <29 Unhealthy dietary pattern with much room for improvement.  41-50 Dietary pattern unlikely to meet recommendations for good health and room for improvement.  51-60 More healthful dietary pattern, with some room for improvement.   >60 Healthy dietary pattern, although there may be some specific behaviors that could be improved.    Nutrition Goals Re-Evaluation:   Nutrition Goals Discharge (Final Nutrition Goals Re-Evaluation):   Psychosocial: Target Goals: Acknowledge presence or absence of significant depression and/or stress, maximize coping skills, provide positive support system. Participant is able to verbalize types and ability to use techniques and skills needed for reducing stress and depression.   Education: Stress, Anxiety, and Depression - Group verbal and visual presentation to define topics covered.  Reviews how body is impacted by stress, anxiety, and depression.  Also discusses healthy ways to reduce stress and to treat/manage anxiety and depression.  Written material given at graduation.   Education: Sleep Hygiene -Provides group verbal and written instruction about how sleep can affect your health.  Define sleep hygiene, discuss sleep cycles and impact  of sleep habits. Review good sleep hygiene tips.    Initial Review & Psychosocial Screening:  Initial Psych Review & Screening - 11/04/20 1012      Initial Review   Current issues with None Identified      Family Dynamics   Good Support System? Yes   wife and 2 kidsd live close by with their families, siblings     Barriers   Psychosocial barriers to participate in program There are no  identifiable barriers or psychosocial needs.;The patient should benefit from training in stress management and relaxation.      Screening Interventions   Interventions Encouraged to exercise    Expected Outcomes Short Term goal: Utilizing psychosocial counselor, staff and physician to assist with identification of specific Stressors or current issues interfering with healing process. Setting desired goal for each stressor or current issue identified.;Long Term Goal: Stressors or current issues are controlled or eliminated.;Short Term goal: Identification and review with participant of any Quality of Life or Depression concerns found by scoring the questionnaire.;Long Term goal: The participant improves quality of Life and PHQ9 Scores as seen by post scores and/or verbalization of changes           Quality of Life Scores:   Quality of Life - 11/07/20 1039      Quality of Life   Select Quality of Life      Quality of Life Scores   Health/Function Pre 23.07 %    Socioeconomic Pre 26.57 %    Psych/Spiritual Pre 29.14 %    Family Pre 28.3 %    GLOBAL Pre 25.81 %          Scores of 19 and below usually indicate a poorer quality of life in these areas.  A difference of  2-3 points is a clinically meaningful difference.  A difference of 2-3 points in the total score of the Quality of Life Index has been associated with significant improvement in overall quality of life, self-image, physical symptoms, and general health in studies assessing change in quality of life.  PHQ-9: Recent Review Flowsheet Data     Depression screen [facility] 2/9 11/07/2020 07/01/2020 06/30/2019 02/27/2019 01/29/2019   Decreased Interest 1 0 0 0 0   Down, Depressed, Hopeless 0 0 0 0 0   PHQ - 2 Score 1 0 0 0 0   Altered sleeping 1 - - - 0   Tired, decreased energy 1 - - - 0   Change in appetite 0 - - - 0   Feeling bad or failure about yourself  0 - - - 0   Trouble concentrating 1 - - - 0   Moving slowly or fidgety/restless 2 - - - 0   Suicidal thoughts 0 - - - 0   PHQ-9 Score 6 - - - 0   Difficult doing work/chores Not difficult at all - - - Not difficult at all     Interpretation of Total Score  Total Score Depression Severity:  1-4 = Minimal depression, 5-9 = Mild depression, 10-14 = Moderate depression, 15-19 = Moderately severe depression, 20-27 = Severe depression   Psychosocial Evaluation and Intervention:  Psychosocial Evaluation - 11/04/20 1024      Psychosocial Evaluation & Interventions   Interventions Encouraged to exercise with the program and follow exercise prescription    Comments Trayson has no barriers to attending the program. He is ready to start. He lives with his wife of 47 years. His 2 sons and their families live 1 and 2 doors down from his home. His entire family is is support system as well as his siblings and their families. He likes to work with old cars, has always raised a garden. His wife sews.  He said that keeps them busy. He states he has no stress, depression nor aniexty at this time. He should do well in the program.    Expected Outcomes STG Conn attends all scheduled sessions.  He continues to live stress  and depression free.  LTG Mahin is able to continue any lifestyle changes learned in the program.    Continue Psychosocial Services  Follow up required by staff           Psychosocial Re-Evaluation:   Psychosocial Discharge (Final Psychosocial Re-Evaluation):   Vocational Rehabilitation: Provide vocational rehab assistance to qualifying candidates.   Vocational Rehab Evaluation  & Intervention:  Vocational Rehab - 11/04/20 1028      Initial Vocational Rehab Evaluation & Intervention   Assessment shows need for Vocational Rehabilitation No           Education: Education Goals: Education classes will be provided on a variety of topics geared toward better understanding of heart health and risk factor modification. Participant will state understanding/return demonstration of topics presented as noted by education test scores.  Learning Barriers/Preferences:   General Cardiac Education Topics:  AED/CPR: - Group verbal and written instruction with the use of models to demonstrate the basic use of the AED with the basic ABC's of resuscitation.   Anatomy and Cardiac Procedures: - Group verbal and visual presentation and models provide information about basic cardiac anatomy and function. Reviews the testing methods done to diagnose heart disease and the outcomes of the test results. Describes the treatment choices: Medical Management, Angioplasty, or Coronary Bypass Surgery for treating various heart conditions including Myocardial Infarction, Angina, Valve Disease, and Cardiac Arrhythmias.  Written material given at graduation.   Medication Safety: - Group verbal and visual instruction to review commonly prescribed medications for heart and lung disease. Reviews the medication, class of the drug, and side effects. Includes the steps to properly store meds and maintain the prescription regimen.  Written material given at graduation.   Intimacy: - Group verbal instruction through game format to discuss how heart and lung disease can affect sexual intimacy. Written material given at graduation..   Know Your Numbers and Heart Failure: - Group verbal and visual instruction to discuss disease risk factors for cardiac and pulmonary disease and treatment options.  Reviews associated critical values for Overweight/Obesity, Hypertension, Cholesterol, and Diabetes.   Discusses basics of heart failure: signs/symptoms and treatments.  Introduces Heart Failure Zone chart for action plan for heart failure.  Written material given at graduation.   Infection Prevention: - Provides verbal and written material to individual with discussion of infection control including proper hand washing and proper equipment cleaning during exercise session. Flowsheet Row Cardiac Rehab from 11/07/2020 in [facility] Cardiac and Pulmonary Rehab  Date 11/07/20  Educator [facility]  Instruction Review Code 1- Verbalizes Understanding      Falls Prevention: - Provides verbal and written material to individual with discussion of falls prevention and safety. Flowsheet Row Cardiac Rehab from 11/07/2020 in [facility] Cardiac and Pulmonary Rehab  Date 11/04/20  Educator SB  Instruction Review Code 1- Verbalizes Understanding      Other: -Provides group and verbal instruction on various topics (see comments)   Knowledge Questionnaire Score:  Knowledge Questionnaire Score - 11/07/20 1041      Knowledge Questionnaire Score   Pre Score 25/26 Education Focus: Nutrition           Core Components/Risk Factors/Patient Goals at Admission:  Personal Goals and Risk Factors at Admission - 11/07/20 1041      Core Components/Risk Factors/Patient Goals on Admission    Weight Management Yes;Weight Loss    Intervention Weight Management: Provide education and appropriate resources to help participant work on and attain dietary goals.;Weight Management: Develop a combined nutrition  and exercise program designed to reach desired caloric intake, while maintaining appropriate intake of nutrient and fiber, sodium and fats, and appropriate energy expenditure required for the weight goal.;Weight Management/Obesity: Establish reasonable short term and long term weight goals.    Admit Weight 159 lb 6.4 oz (72.3 kg)    Goal Weight: Short Term 155 lb (70.3 kg)    Goal Weight: Long Term 150 lb (68 kg)    Expected  Outcomes Short Term: Continue to assess and modify interventions until short term weight is achieved;Long Term: Adherence to nutrition and physical activity/exercise program aimed toward attainment of established weight goal;Understanding recommendations for meals to include 15-35% energy as protein, 25-35% energy from fat, 35-60% energy from carbohydrates, less than 200mg  of dietary cholesterol, 20-35 gm of total fiber daily;Weight Loss: Understanding of general recommendations for a balanced deficit meal plan, which promotes 1-2 lb weight loss per week and includes a negative energy balance of [phone] kcal/d    Hypertension Yes    Intervention Provide education on lifestyle modifcations including regular physical activity/exercise, weight management, moderate sodium restriction and increased consumption of fresh fruit, vegetables, and low fat dairy, alcohol moderation, and smoking cessation.;Monitor prescription use compliance.    Expected Outcomes Short Term: Continued assessment and intervention until BP is < 140/84mm HG in hypertensive participants. < 130/46mm HG in hypertensive participants with diabetes, heart failure or chronic kidney disease.;Long Term: Maintenance of blood pressure at goal levels.    Lipids Yes    Intervention Provide education and support for participant on nutrition & aerobic/resistive exercise along with prescribed medications to achieve LDL 70mg , HDL >40mg .    Expected Outcomes Short Term: Participant states understanding of desired cholesterol values and is compliant with medications prescribed. Participant is following exercise prescription and nutrition guidelines.;Long Term: Cholesterol controlled with medications as prescribed, with individualized exercise RX and with personalized nutrition plan. Value goals: LDL < 70mg , HDL > 40 mg.           Education:Diabetes - Individual verbal and written instruction to review signs/symptoms of diabetes, desired ranges of  glucose level fasting, after meals and with exercise. Acknowledge that pre and post exercise glucose checks will be done for 3 sessions at entry of program.   Core Components/Risk Factors/Patient Goals Review:    Core Components/Risk Factors/Patient Goals at Discharge (Final Review):    ITP Comments:  ITP Comments    Row Name 11/04/20 1029 11/07/20 1034 11/16/20 0804       ITP Comments Virtual orientation call completed today. he has an appointment on Date: 11/07/2020 for EP eval and gym Orientation.  Documentation of diagnosis can be found in [facility] Date: 09/26/2020. Completed 6MWT and gym orientation. Initial ITP created and sent for review to Dr. Emily Filbert, Medical Director. 30 Day review completed. Medical Director ITP review done, changes made as directed, and signed approval by Medical Director.  New to program            Comments:

## 2020-11-16 NOTE — Progress Notes (Signed)
Daily Session Note  Patient Details  Name: IBRAHIMA HOLBERG MRN: [id number] Date of Birth: [birth year] Referring Provider:   Flowsheet Row Cardiac Rehab from 11/07/2020 in [facility] Cardiac and Pulmonary Rehab  Referring Provider Bartholome Bill MD      Encounter Date: 11/16/2020  Check In:  Session Check In - 11/16/20 0954      Check-In   Supervising physician immediately available to respond to emergencies See telemetry face sheet for immediately available ER MD    Location [facility]-Cardiac & Pulmonary Rehab    Staff Present Renita Papa, RN BSN;Joseph Hood RCP,RRT,BSRT;Melissa Poynette RDN, LDN    Virtual Visit No    Medication changes reported     No    Fall or balance concerns reported    No    Warm-up and Cool-down Performed on first and last piece of equipment    Resistance Training Performed Yes    VAD Patient? No    PAD/SET Patient? No      Pain Assessment   Currently in Pain? No/denies              Social History   Tobacco Use  Smoking Status Former Smoker  . Packs/day: 1.00  . Years: 40.00  . Pack years: 40.00  . Types: Cigarettes  . Quit date: 2000  . Years since quitting: 22.3  Smokeless Tobacco Never Used    Goals Met:  Independence with exercise equipment Exercise tolerated well No report of cardiac concerns or symptoms Strength training completed today  Goals Unmet:  Not Applicable  Comments: Pt able to follow exercise prescription today without complaint.  Will continue to monitor for progression.    Dr. Emily Filbert is Medical Director for [place] and [facility].

## 2020-11-18 ENCOUNTER — Encounter: Payer: PPO | Admitting: *Deleted

## 2020-11-18 ENCOUNTER — Other Ambulatory Visit: Payer: Self-pay

## 2020-11-18 DIAGNOSIS — I214 Non-ST elevation (NSTEMI) myocardial infarction: Secondary | ICD-10-CM

## 2020-11-18 DIAGNOSIS — I2510 Atherosclerotic heart disease of native coronary artery without angina pectoris: Secondary | ICD-10-CM | POA: Diagnosis not present

## 2020-11-18 NOTE — Progress Notes (Signed)
Daily Session Note  Patient Details  Name: James Peck MRN: [id number] Date of Birth: 06-10-1941 Referring Provider:   Flowsheet Row Cardiac Rehab from 11/07/2020 in [facility] Cardiac and Pulmonary Rehab  Referring Provider Bartholome Bill MD      Encounter Date: 11/18/2020  Check In:  Session Check In - 11/18/20 1015      Check-In   Supervising physician immediately available to respond to emergencies See telemetry face sheet for immediately available ER MD    Location [facility]-Cardiac & Pulmonary Rehab    Staff Present Heath Lark, RN, BSN, CCRP;Meredith Sherryll Burger, RN BSN;Joseph Lou Miner, [place] Exercise Physiologist    Virtual Visit No    Medication changes reported     No    Fall or balance concerns reported    No    Warm-up and Cool-down Performed on first and last piece of equipment    Resistance Training Performed Yes    VAD Patient? No    PAD/SET Patient? No      Pain Assessment   Currently in Pain? No/denies              Social History   Tobacco Use  Smoking Status Former Smoker  . Packs/day: 1.00  . Years: 40.00  . Pack years: 40.00  . Types: Cigarettes  . Quit date: 2000  . Years since quitting: 22.3  Smokeless Tobacco Never Used    Goals Met:  Independence with exercise equipment Exercise tolerated well No report of cardiac concerns or symptoms  Goals Unmet:  Not Applicable  Comments: Pt able to follow exercise prescription today without complaint.  Will continue to monitor for progression.    Dr. Emily Filbert is Medical Director for [place] and [facility].

## 2020-11-21 ENCOUNTER — Encounter: Payer: PPO | Admitting: *Deleted

## 2020-11-21 ENCOUNTER — Other Ambulatory Visit: Payer: Self-pay

## 2020-11-21 DIAGNOSIS — I2510 Atherosclerotic heart disease of native coronary artery without angina pectoris: Secondary | ICD-10-CM | POA: Diagnosis not present

## 2020-11-21 DIAGNOSIS — I214 Non-ST elevation (NSTEMI) myocardial infarction: Secondary | ICD-10-CM

## 2020-11-21 NOTE — Progress Notes (Signed)
Daily Session Note  Patient Details  Name: James Peck MRN: [id number] Date of Birth: 01-23-1941 Referring Provider:   Flowsheet Row Cardiac Rehab from 11/07/2020 in [facility] Cardiac and Pulmonary Rehab  Referring Provider Bartholome Bill MD      Encounter Date: 11/21/2020  Check In:  Session Check In - 11/21/20 1058      Check-In   Supervising physician immediately available to respond to emergencies See telemetry face sheet for immediately available ER MD    Location [facility]-Cardiac & Pulmonary Rehab    Staff Present Renita Papa, RN BSN;Joseph Tedd Sias, [place], ACSM CEP, Exercise Physiologist;Kelly Rosalia Hammers, MPA, RN    Virtual Visit No    Medication changes reported     No    Fall or balance concerns reported    No    Warm-up and Cool-down Performed on first and last piece of equipment    Resistance Training Performed Yes    VAD Patient? No    PAD/SET Patient? No      Pain Assessment   Currently in Pain? No/denies              Social History   Tobacco Use  Smoking Status Former Smoker  . Packs/day: 1.00  . Years: 40.00  . Pack years: 40.00  . Types: Cigarettes  . Quit date: 2000  . Years since quitting: 22.3  Smokeless Tobacco Never Used    Goals Met:  Independence with exercise equipment Exercise tolerated well No report of cardiac concerns or symptoms Strength training completed today  Goals Unmet:  Not Applicable  Comments: Pt able to follow exercise prescription today without complaint.  Will continue to monitor for progression.    Dr. Emily Filbert is Medical Director for [place] and [facility].

## 2020-11-23 ENCOUNTER — Other Ambulatory Visit: Payer: Self-pay

## 2020-11-23 DIAGNOSIS — I2510 Atherosclerotic heart disease of native coronary artery without angina pectoris: Secondary | ICD-10-CM | POA: Diagnosis not present

## 2020-11-23 DIAGNOSIS — I214 Non-ST elevation (NSTEMI) myocardial infarction: Secondary | ICD-10-CM

## 2020-11-23 NOTE — Progress Notes (Signed)
Daily Session Note  Patient Details  Name: James Peck MRN: [id number] Date of Birth: 1940-09-20 Referring Provider:   Flowsheet Row Cardiac Rehab from 11/07/2020 in [facility] Cardiac and Pulmonary Rehab  Referring Provider Bartholome Bill MD      Encounter Date: 11/23/2020  Check In:  Session Check In - 11/23/20 0954      Check-In   Supervising physician immediately available to respond to emergencies See telemetry face sheet for immediately available ER MD    Location [facility]-Cardiac & Pulmonary Rehab    Staff Present Birdie Sons, MPA, Elveria Rising, BA, ACSM CEP, Exercise Physiologist;Joseph Tessie Fass RCP,RRT,BSRT    Virtual Visit No    Medication changes reported     No    Fall or balance concerns reported    No    Warm-up and Cool-down Performed on first and last piece of equipment    Resistance Training Performed Yes    VAD Patient? No    PAD/SET Patient? No      Pain Assessment   Currently in Pain? No/denies              Social History   Tobacco Use  Smoking Status Former Smoker  . Packs/day: 1.00  . Years: 40.00  . Pack years: 40.00  . Types: Cigarettes  . Quit date: 2000  . Years since quitting: 22.3  Smokeless Tobacco Never Used    Goals Met:  Independence with exercise equipment Exercise tolerated well No report of cardiac concerns or symptoms Strength training completed today  Goals Unmet:  Not Applicable  Comments: Pt able to follow exercise prescription today without complaint.  Will continue to monitor for progression.    Dr. Emily Filbert is Medical Director for [place] and [facility].

## 2020-11-25 ENCOUNTER — Other Ambulatory Visit: Payer: Self-pay

## 2020-11-25 DIAGNOSIS — I214 Non-ST elevation (NSTEMI) myocardial infarction: Secondary | ICD-10-CM

## 2020-11-25 DIAGNOSIS — I2510 Atherosclerotic heart disease of native coronary artery without angina pectoris: Secondary | ICD-10-CM | POA: Diagnosis not present

## 2020-11-25 NOTE — Progress Notes (Signed)
Daily Session Note  Patient Details  Name: James Peck MRN: [id number] Date of Birth: 11-05-1940 Referring Provider:   Flowsheet Row Cardiac Rehab from 11/07/2020 in [facility] Cardiac and Pulmonary Rehab  Referring Provider Bartholome Bill MD      Encounter Date: 11/25/2020  Check In:  Session Check In - 11/25/20 1025      Check-In   Supervising physician immediately available to respond to emergencies See telemetry face sheet for immediately available ER MD    Location [facility]-Cardiac & Pulmonary Rehab    Staff Present Basilia Jumbo, RN, BSN;Meredith Sherryll Burger, RN BSN;Joseph [address] [place], [place], [place], CCRP, CCET    Virtual Visit No    Medication changes reported     No    Fall or balance concerns reported    No    Warm-up and Cool-down Performed on first and last piece of equipment    Resistance Training Performed Yes    VAD Patient? No    PAD/SET Patient? No      Pain Assessment   Currently in Pain? No/denies              Social History   Tobacco Use  Smoking Status Former Smoker  . Packs/day: 1.00  . Years: 40.00  . Pack years: 40.00  . Types: Cigarettes  . Quit date: 2000  . Years since quitting: 22.3  Smokeless Tobacco Never Used    Goals Met:  Independence with exercise equipment Exercise tolerated well No report of cardiac concerns or symptoms  Goals Unmet:  Not Applicable  Comments: Pt able to follow exercise prescription today without complaint.  Will continue to monitor for progression.   Dr. Emily Filbert is Medical Director for [place] and [facility].

## 2020-11-28 ENCOUNTER — Other Ambulatory Visit: Payer: Self-pay

## 2020-11-28 ENCOUNTER — Other Ambulatory Visit (INDEPENDENT_AMBULATORY_CARE_PROVIDER_SITE_OTHER): Payer: PPO

## 2020-11-28 ENCOUNTER — Encounter: Payer: PPO | Attending: Cardiology

## 2020-11-28 DIAGNOSIS — Z125 Encounter for screening for malignant neoplasm of prostate: Secondary | ICD-10-CM

## 2020-11-28 DIAGNOSIS — I10 Essential (primary) hypertension: Secondary | ICD-10-CM | POA: Diagnosis not present

## 2020-11-28 DIAGNOSIS — R739 Hyperglycemia, unspecified: Secondary | ICD-10-CM

## 2020-11-28 DIAGNOSIS — E785 Hyperlipidemia, unspecified: Secondary | ICD-10-CM

## 2020-11-28 DIAGNOSIS — I214 Non-ST elevation (NSTEMI) myocardial infarction: Secondary | ICD-10-CM | POA: Diagnosis not present

## 2020-11-28 LAB — HEPATIC FUNCTION PANEL
ALT: 20 U/L (ref 0–53)
AST: 19 U/L (ref 0–37)
Albumin: 4.2 g/dL (ref 3.5–5.2)
Alkaline Phosphatase: 54 U/L (ref 39–117)
Bilirubin, Direct: 0.1 mg/dL (ref 0.0–0.3)
Total Bilirubin: 0.5 mg/dL (ref 0.2–1.2)
Total Protein: 6.3 g/dL (ref 6.0–8.3)

## 2020-11-28 LAB — BASIC METABOLIC PANEL
BUN: 10 mg/dL (ref 6–23)
CO2: 29 mEq/L (ref 19–32)
Calcium: 9.8 mg/dL (ref 8.4–10.5)
Chloride: 100 mEq/L (ref 96–112)
Creatinine, Ser: 1.01 mg/dL (ref 0.40–1.50)
GFR: 70.88 mL/min (ref >60.00)
Glucose, Bld: 106 mg/dL — ABNORMAL HIGH (ref 70–99)
Potassium: 4.8 mEq/L (ref 3.5–5.1)
Sodium: 136 mEq/L (ref 135–145)

## 2020-11-28 LAB — LIPID PANEL
Cholesterol: 123 mg/dL (ref 0–200)
HDL: 37.2 mg/dL — ABNORMAL LOW (ref >39.00)
LDL Cholesterol: 68 mg/dL (ref 0–99)
NonHDL: 86.15
Total CHOL/HDL Ratio: 3
Triglycerides: 90 mg/dL (ref 0.0–149.0)
VLDL: 18 mg/dL (ref 0.0–40.0)

## 2020-11-28 LAB — HEMOGLOBIN A1C: Hgb A1c MFr Bld: 6.1 % (ref 4.6–6.5)

## 2020-11-28 LAB — PSA, MEDICARE: PSA: 1.27 ng/ml (ref 0.10–4.00)

## 2020-11-28 NOTE — Progress Notes (Signed)
Daily Session Note  Patient Details  Name: James Peck MRN: [id number] Date of Birth: 12/09/1940 Referring Provider:   Flowsheet Row Cardiac Rehab from 11/07/2020 in [facility] Cardiac and Pulmonary Rehab  Referring Provider Bartholome Bill MD      Encounter Date: 11/28/2020  Check In:  Session Check In - 11/28/20 0951      Check-In   Supervising physician immediately available to respond to emergencies See telemetry face sheet for immediately available ER MD    Location [facility]-Cardiac & Pulmonary Rehab    Staff Present Alberteen Sam, MA, RCEP, CCRP, CCET;Amanda Sommer, BA, ACSM CEP, Exercise Physiologist;Kelly Amedeo Plenty, BS, ACSM CEP, Exercise Physiologist;Sohum [place] Psychologist, prison and probation services, BSN;Joseph [place] [employer]    Virtual Visit No    Medication changes reported     No    Fall or balance concerns reported    No    Tobacco Cessation No Change    Warm-up and Cool-down Performed on first and last piece of equipment    Resistance Training Performed Yes    VAD Patient? No    PAD/SET Patient? No      Pain Assessment   Currently in Pain? No/denies              Social History   Tobacco Use  Smoking Status Former Smoker  . Packs/day: 1.00  . Years: 40.00  . Pack years: 40.00  . Types: Cigarettes  . Quit date: 2000  . Years since quitting: 22.3  Smokeless Tobacco Never Used    Goals Met:  Proper associated with RPD/PD & O2 Sat Independence with exercise equipment Exercise tolerated well No report of cardiac concerns or symptoms Strength training completed today  Goals Unmet:  Not Applicable  Comments: Pt able to follow exercise prescription today without complaint.  Will continue to monitor for progression.   Dr. Emily Filbert is Medical Director for [place] and [facility].

## 2020-11-30 ENCOUNTER — Ambulatory Visit (INDEPENDENT_AMBULATORY_CARE_PROVIDER_SITE_OTHER): Payer: PPO | Admitting: Internal Medicine

## 2020-11-30 ENCOUNTER — Other Ambulatory Visit: Payer: Self-pay

## 2020-11-30 DIAGNOSIS — I2510 Atherosclerotic heart disease of native coronary artery without angina pectoris: Secondary | ICD-10-CM

## 2020-11-30 DIAGNOSIS — R739 Hyperglycemia, unspecified: Secondary | ICD-10-CM | POA: Diagnosis not present

## 2020-11-30 DIAGNOSIS — J439 Emphysema, unspecified: Secondary | ICD-10-CM

## 2020-11-30 DIAGNOSIS — I10 Essential (primary) hypertension: Secondary | ICD-10-CM

## 2020-11-30 DIAGNOSIS — E785 Hyperlipidemia, unspecified: Secondary | ICD-10-CM | POA: Diagnosis not present

## 2020-11-30 DIAGNOSIS — J309 Allergic rhinitis, unspecified: Secondary | ICD-10-CM | POA: Diagnosis not present

## 2020-11-30 NOTE — Progress Notes (Signed)
Patient ID: James Peck, male   DOB: 17-Dec-1940, 80 y.o.   MRN: [id number]   Subjective:    Patient ID: James Peck, male    DOB: [birth year], 80 y.o.   MRN: [id number]  HPI This visit occurred during the SARS-CoV-2 public health emergency.  Safety protocols were in place, including screening questions prior to the visit, additional usage of staff PPE, and extensive cleaning of exam room while observing appropriate contact time as indicated for disinfecting solutions.  Patient here for a scheduled follow up.  Here to follow up regarding his CAD, hypertension and hypercholesterolemia.  He is going to heart track three days per week.  This appears to be going well. They are monitoring his blood pressure.  States highest reading - 595 systolic.  Off metoprolol.  No chest pain or sob reported.  No abdominal pain or bowel change reported.  Feeling better.  Energy improving.    Past Medical History:  Diagnosis Date  . Allergy   . Arthritis   . Coronary artery disease   . GERD (gastroesophageal reflux disease)   . History of hiatal hernia   . Hypercholesteremia   . Hypertension    Past Surgical History:  Procedure Laterality Date  . CARDIAC CATHETERIZATION    . cardiac stents    . CATARACT EXTRACTION W/PHACO Right 09/10/2017   Procedure: CATARACT EXTRACTION PHACO AND INTRAOCULAR LENS PLACEMENT (IOC);  Surgeon: Birder Robson, MD;  Location: [facility] ORS;  Service: Ophthalmology;  Laterality: Right;  [place] 01:01.7 AP% 15.3 CDE 9.38 Fluid pack lot # [id number]  . CATARACT EXTRACTION W/PHACO Left 10/02/2017   Procedure: CATARACT EXTRACTION PHACO AND INTRAOCULAR LENS PLACEMENT (IOC);  Surgeon: Birder Robson, MD;  Location: [facility] ORS;  Service: Ophthalmology;  Laterality: Left;  [place] 00:42.3 AP% 16.2 CDE 6.85 Fluid Pack Lot # [id number]  . COLONOSCOPY WITH PROPOFOL N/A 06/28/2017   Procedure: COLONOSCOPY WITH PROPOFOL;  Surgeon: Manya Silvas, MD;  Location: [facility] ENDOSCOPY;  Service: Endoscopy;   Laterality: N/A;  . CORONARY ANGIOPLASTY     STENTS X 2  . KNEE ARTHROSCOPY    . LEFT HEART CATH AND CORONARY ANGIOGRAPHY N/A 09/12/2020   Procedure: LEFT HEART CATH AND CORONARY ANGIOGRAPHY;  Surgeon: Isaias Cowman, MD;  Location: [facility];  Service: Cardiovascular;  Laterality: N/A;   Family History  Problem Relation Age of Onset  . Heart disease Mother   . Diabetes Mother   . Heart disease Father   . Diabetes Father   . Alzheimer's disease Brother   . Stroke Brother    Social History   Socioeconomic History  . Marital status: Married    Spouse name: Not on file  . Number of children: Not on file  . Years of education: Not on file  . Highest education level: Not on file  Occupational History  . Not on file  Tobacco Use  . Smoking status: Former Smoker    Packs/day: 1.00    Years: 40.00    Pack years: 40.00    Types: Cigarettes    Quit date: 2000    Years since quitting: 22.3  . Smokeless tobacco: Never Used  Vaping Use  . Vaping Use: Never used  Substance and Sexual Activity  . Alcohol use: No  . Drug use: No  . Sexual activity: Not on file  Other Topics Concern  . Not on file  Social History Narrative  . Not on file   Social Determinants of Health   Financial  Resource Strain: Low Risk   . Difficulty of Paying Living Expenses: Not hard at all  Food Insecurity: No Food Insecurity  . Worried About Charity fundraiser in the Last Year: Never true  . Ran Out of Food in the Last Year: Never true  Transportation Needs: No Transportation Needs  . Lack of Transportation (Medical): No  . Lack of Transportation (Non-Medical): No  Physical Activity: Sufficiently Active  . Days of Exercise per Week: 5 days  . Minutes of Exercise per Session: 30 min  Stress: No Stress Concern Present  . Feeling of Stress : Not at all  Social Connections: Unknown  . Frequency of Communication with Friends and Family: Not on file  . Frequency of Social Gatherings  with Friends and Family: Not on file  . Attends Religious Services: Not on file  . Active Member of Clubs or Organizations: Not on file  . Attends Archivist Meetings: Not on file  . Marital Status: Married    Outpatient Encounter Medications as of 11/30/2020  Medication Sig  . albuterol (VENTOLIN HFA) 108 (90 Base) MCG/ACT inhaler Inhale 1-2 puffs into the lungs every 6 (six) hours as needed for wheezing or shortness of breath.  Jearl Klinefelter ELLIPTA 62.5-25 MCG/INH AEPB Inhale 1 puff into the lungs daily.  Marland Kitchen aspirin EC 81 MG tablet Take 81 mg by mouth daily.   . calcium carbonate (OS-CAL - DOSED IN MG OF ELEMENTAL CALCIUM) 1250 (500 CA) MG tablet Take 1 tablet by mouth daily.   . cetirizine (ZYRTEC) 10 MG tablet TAKE 1 TABLET BY MOUTH DAILY (Patient taking differently: Take 10 mg by mouth daily.)  . chlorpheniramine (CHLOR-TRIMETON) 4 MG tablet Take 4 mg by mouth 2 (two) times daily as needed for allergies.  Marland Kitchen CINNAMON PO Take 1,200 mg by mouth 2 times daily at 12 noon and 4 pm.  . clopidogrel (PLAVIX) 75 MG tablet Take 1 tablet (75 mg total) by mouth daily.  Marland Kitchen dextromethorphan-guaiFENesin (MUCINEX DM) 30-600 MG 12hr tablet Take 1 tablet by mouth 2 (two) times daily.  . diclofenac sodium (VOLTAREN) 1 % GEL Apply 2 g 2 (two) times daily topically. To affected joint.  . fluticasone (FLONASE) 50 MCG/ACT nasal spray TAKE 2 SPRAYS INTO BOTH NOSTRILS DAILY  . GLUCOSAMINE-CHONDROITIN PO Take 1 capsule by mouth daily.  . Misc Natural Products (BLACK CHERRY CONCENTRATE PO) Take 1,000 mg daily by mouth.  . nitroGLYCERIN (NITROSTAT) 0.4 MG SL tablet Place 1 tablet (0.4 mg total) under the tongue every 5 (five) minutes as needed for chest pain.  . Omega-3 Fatty Acids (FISH OIL) 1000 MG CAPS Take 1,000 mg by mouth daily.   . rosuvastatin (CRESTOR) 20 MG tablet Take 1 tablet (20 mg total) by mouth daily.  . sildenafil (REVATIO) 20 MG tablet TAKE 1 TO 2 TABLETS BY MOUTH EVERY DAY AS NEEDED  .  telmisartan (MICARDIS) 20 MG tablet TAKE ONE TABLET EVERY DAY (Patient taking differently: Take 20 mg by mouth daily.)  . [DISCONTINUED] colchicine 0.6 MG tablet Take 1 tablet (0.6 mg total) by mouth daily. (Patient not taking: Reported on 11/04/2020)  . [DISCONTINUED] metoprolol succinate (TOPROL XL) 25 MG 24 hr tablet Take 1 tablet (25 mg total) by mouth daily.  . [DISCONTINUED] omeprazole (PRILOSEC) 20 MG capsule Take 1 capsule (20 mg total) by mouth daily.   No facility-administered encounter medications on file as of 11/30/2020.    Review of Systems  Constitutional: Negative for appetite change and unexpected weight  change.  HENT: Negative for congestion and sinus pressure.   Respiratory: Negative for cough, chest tightness and shortness of breath.   Cardiovascular: Negative for chest pain, palpitations and leg swelling.  Gastrointestinal: Negative for abdominal pain, diarrhea, nausea and vomiting.  Genitourinary: Negative for difficulty urinating and dysuria.  Musculoskeletal: Negative for joint swelling and myalgias.  Skin: Negative for color change and rash.  Neurological: Negative for dizziness, light-headedness and headaches.  Psychiatric/Behavioral: Negative for agitation and dysphoric mood.       Objective:    Physical Exam Vitals reviewed.  Constitutional:      General: He is not in acute distress.    Appearance: Normal appearance. He is well-developed.  HENT:     Head: Normocephalic and atraumatic.     Right Ear: External ear normal.     Left Ear: External ear normal.  Eyes:     General: No scleral icterus.       Right eye: No discharge.        Left eye: No discharge.     Conjunctiva/sclera: Conjunctivae normal.  Cardiovascular:     Rate and Rhythm: Normal rate and regular rhythm.  Pulmonary:     Effort: Pulmonary effort is normal. No respiratory distress.     Breath sounds: Normal breath sounds.  Abdominal:     General: Bowel sounds are normal.     Palpations:  Abdomen is soft.     Tenderness: There is no abdominal tenderness.  Musculoskeletal:        General: No swelling or tenderness.     Cervical back: Neck supple. No rigidity or tenderness.  Skin:    Findings: No erythema or rash.  Neurological:     Mental Status: He is alert.  Psychiatric:        Mood and Affect: Mood normal.        Behavior: Behavior normal.     BP 100/60   Pulse 74   Temp (!) 96.2 F (35.7 C) (Temporal)   Resp 16   Ht _0  (1.676 m)   Wt 157 lb (71.2 kg)   SpO2 98%   BMI 25.34 kg/m  Wt Readings from Last 3 Encounters:  11/30/20 157 lb (71.2 kg)  11/07/20 159 lb 6.4 oz (72.3 kg)  09/15/20 158 lb 6.4 oz (71.8 kg)     Lab Results  Component Value Date   WBC 14.1 (H) 09/12/2020   HGB 14.8 09/12/2020   HCT 42.4 09/12/2020   PLT 304 09/12/2020   GLUCOSE 106 (H) 11/28/2020   CHOL 123 11/28/2020   TRIG 90.0 11/28/2020   HDL 37.20 (L) 11/28/2020   LDLCALC 68 11/28/2020   ALT 20 11/28/2020   AST 19 11/28/2020   NA 136 11/28/2020   K 4.8 11/28/2020   CL 100 11/28/2020   CREATININE 1.01 11/28/2020   BUN 10 11/28/2020   CO2 29 11/28/2020   TSH 2.39 03/15/2020   PSA 1.27 11/28/2020   INR 1.0 09/11/2020   HGBA1C 6.1 11/28/2020    CARDIAC CATHETERIZATION  Result Date: 09/12/2020  Prox Cx lesion is 30% stenosed.  Mid Cx lesion is 20% stenosed.  Prox LAD lesion is 20% stenosed.  Prox LAD to Mid LAD lesion is 30% stenosed.  Mid LAD to Dist LAD lesion is 50% stenosed.  Prox RCA to Mid RCA lesion is 20% stenosed.  1.  Insignificant coronary artery disease with patent stent proximal/mid RCA, 50% stenosis mid/distal LAD with muscle bridge 2.  Mildly reduced left  ventricular function Recommendations 1.  Medical therapy 2.  Aggressive risk factor modification 3.  Dual antiplatelet therapy   ECHOCARDIOGRAM COMPLETE  Result Date: 09/13/2020    ECHOCARDIOGRAM REPORT   Patient Name:   TAMEL ABEL [facility] Date of Exam: 09/12/2020 Medical Rec #:  [id number]    Height:        66.0 in Accession #:    [id number]   Weight:       155.0 lb Date of Birth:  [birth year]    BSA:          1.794 m Patient Age:    48 years     BP:           114/69 mmHg Patient Gender: M            HR:           69 bpm. Exam Location:  [facility] Procedure: Cardiac Doppler, Color Doppler and 2D Echo Indications:     NSTEMI I21.4  History:         Patient has no prior history of Echocardiogram examinations.                  CAD; Risk Factors:Hypertension.  Sonographer:     Alyse Low Roar Referring Phys:  [id number] RXYVOPF AMIN Diagnosing Phys: Isaias Cowman MD IMPRESSIONS  1. Left ventricular ejection fraction, by estimation, is 60 to 65%. The left ventricle has normal function. The left ventricle has no regional wall motion abnormalities. Left ventricular diastolic parameters are consistent with Grade I diastolic dysfunction (impaired relaxation).  2. Right ventricular systolic function is normal. The right ventricular size is normal.  3. The mitral valve is normal in structure. Mild mitral valve regurgitation. No evidence of mitral stenosis.  4. The aortic valve is normal in structure. Aortic valve regurgitation is not visualized. No aortic stenosis is present.  5. The inferior vena cava is normal in size with greater than 50% respiratory variability, suggesting right atrial pressure of 3 mmHg. FINDINGS  Left Ventricle: Left ventricular ejection fraction, by estimation, is 60 to 65%. The left ventricle has normal function. The left ventricle has no regional wall motion abnormalities. The left ventricular internal cavity size was normal in size. There is  no left ventricular hypertrophy. Left ventricular diastolic parameters are consistent with Grade I diastolic dysfunction (impaired relaxation). Right Ventricle: The right ventricular size is normal. No increase in right ventricular wall thickness. Right ventricular systolic function is normal. Left Atrium: Left atrial size was normal in size. Right Atrium: Right  atrial size was normal in size. Pericardium: There is no evidence of pericardial effusion. Mitral Valve: The mitral valve is normal in structure. Mild mitral valve regurgitation. No evidence of mitral valve stenosis. Tricuspid Valve: The tricuspid valve is normal in structure. Tricuspid valve regurgitation is mild . No evidence of tricuspid stenosis. Aortic Valve: The aortic valve is normal in structure. Aortic valve regurgitation is not visualized. No aortic stenosis is present. Aortic valve peak gradient measures 4.8 mmHg. Pulmonic Valve: The pulmonic valve was normal in structure. Pulmonic valve regurgitation is not visualized. No evidence of pulmonic stenosis. Aorta: The aortic root is normal in size and structure. Venous: The inferior vena cava is normal in size with greater than 50% respiratory variability, suggesting right atrial pressure of 3 mmHg. IAS/Shunts: No atrial level shunt detected by color flow Doppler.  LEFT VENTRICLE PLAX 2D LVIDd:         4.53 cm  Diastology LVIDs:  3.03 cm  LV e' medial:    7.51 cm/s LV PW:         0.97 cm  LV E/e' medial:  12.6 LV IVS:        0.95 cm  LV e' lateral:   6.85 cm/s LVOT diam:     1.90 cm  LV E/e' lateral: 13.8 LVOT Area:     2.84 cm  RIGHT VENTRICLE RV Mid diam:    2.84 cm RV S prime:     14.10 cm/s TAPSE (M-mode): 2.8 cm LEFT ATRIUM             Index       RIGHT ATRIUM           Index LA diam:        3.30 cm 1.84 cm/m  RA Area:     13.20 cm LA Vol (A2C):   36.3 ml 20.23 ml/m RA Volume:   30.60 ml  17.05 ml/m LA Vol (A4C):   33.8 ml 18.84 ml/m LA Biplane Vol: 35.1 ml 19.56 ml/m  AORTIC VALVE                PULMONIC VALVE AV Area (Vmax): 2.84 cm    PV Vmax:        1.14 m/s AV Vmax:        110.00 cm/s PV Peak grad:   5.2 mmHg AV Peak Grad:   4.8 mmHg    RVOT Peak grad: 2 mmHg LVOT Vmax:      110.00 cm/s  AORTA Ao Root diam: 3.10 cm MITRAL VALVE MV Area (PHT): 4.41 cm     SHUNTS MV Decel Time: 172 msec     Systemic Diam: 1.90 cm MV E velocity: 94.60  cm/s MV A velocity: 122.00 cm/s MV E/A ratio:  0.78 MV A Prime:    12.8 cm/s Isaias Cowman MD Electronically signed by Isaias Cowman MD Signature Date/Time: 09/13/2020/1:12:51 PM    Final        Assessment & Plan:   Problem List Items Addressed This Visit    Allergic rhinitis    Continue zyrtec, nasal rinses and steroid nasal spray.  Follow.       Benign essential HTN    Continue micardis.  Blood pressure check by me:  114/62 right arm and 110/62 left arm.  Follow pressures.  Follow metabolic panel.       Relevant Orders   CBC with Differential/Platelet   Basic metabolic panel   CAD in native artery    Recent admission with NSTEMI 08/2020.  09/12/20 - cath reviewed. ( 50% mid/distal LAD ).  No chest pain now.  Feeling better.  Attending heart track.  Continue micardis.  Off metoprolol - low blood pressure. Follow.       Emphysema, unspecified ([place])    Continue anoro.  Breathing stable.       HLD (hyperlipidemia)    Continue simvastatin.  Low cholesterol diet and exercise.  Follow lipid panel and liver function tests.        Relevant Orders   Hepatic function panel   Lipid panel   Hyperglycemia    Low carb diet and exercise. Follow met b and a1c.       Relevant Orders   Hemoglobin A1c       Einar Pheasant, MD

## 2020-12-02 ENCOUNTER — Other Ambulatory Visit: Payer: Self-pay

## 2020-12-02 ENCOUNTER — Encounter: Payer: PPO | Admitting: *Deleted

## 2020-12-02 DIAGNOSIS — I214 Non-ST elevation (NSTEMI) myocardial infarction: Secondary | ICD-10-CM | POA: Diagnosis not present

## 2020-12-02 NOTE — Progress Notes (Signed)
Daily Session Note  Patient Details  Name: James Peck MRN: [id number] Date of Birth: 04/26/1941 Referring Provider:   Flowsheet Row Cardiac Rehab from 11/07/2020 in [facility] Cardiac and Pulmonary Rehab  Referring Provider Bartholome Bill MD      Encounter Date: 12/02/2020  Check In:  Session Check In - 12/02/20 1034      Check-In   Supervising physician immediately available to respond to emergencies See telemetry face sheet for immediately available ER MD    Location [facility]-Cardiac & Pulmonary Rehab    Staff Present Justin Mend RCP,RRT,BSRT;Jessica Luan Pulling, MA, RCEP, CCRP, CCET;Caelen Reierson, RN, BSN, CCRP    Virtual Visit No    Medication changes reported     No    Fall or balance concerns reported    No    Warm-up and Cool-down Performed on first and last piece of equipment    Resistance Training Performed Yes    VAD Patient? No    PAD/SET Patient? No      Pain Assessment   Currently in Pain? No/denies             Exercise Prescription Changes - 12/02/20 1000      Home Exercise Plan   Plans to continue exercise at Home (comment)   walking, cross trainer   Frequency Add 2 additional days to program exercise sessions.    Initial Home Exercises Provided 12/02/20           Social History   Tobacco Use  Smoking Status Former Smoker  . Packs/day: 1.00  . Years: 40.00  . Pack years: 40.00  . Types: Cigarettes  . Quit date: 2000  . Years since quitting: 22.3  Smokeless Tobacco Never Used    Goals Met:  Independence with exercise equipment Exercise tolerated well No report of cardiac concerns or symptoms  Goals Unmet:  Not Applicable  Comments: Pt able to follow exercise prescription today without complaint.  Will continue to monitor for progression.    Dr. Emily Filbert is Medical Director for [facility] and [facility].

## 2020-12-02 NOTE — Progress Notes (Signed)
Reviewed home exercise with pt today.  Pt plans to walk on track at home and use cross trainer at home for exercise.  We also talked about access to staff videos on YouTube.  Reviewed THR, pulse, RPE, sign and symptoms, pulse oximetery and when to call 911 or MD.  Also discussed weather considerations and indoor options.  Pt voiced understanding.

## 2020-12-05 ENCOUNTER — Other Ambulatory Visit: Payer: Self-pay

## 2020-12-05 ENCOUNTER — Encounter: Payer: PPO | Admitting: *Deleted

## 2020-12-05 ENCOUNTER — Other Ambulatory Visit: Payer: Self-pay | Admitting: Internal Medicine

## 2020-12-05 ENCOUNTER — Encounter: Payer: Self-pay | Admitting: Internal Medicine

## 2020-12-05 ENCOUNTER — Telehealth: Payer: Self-pay | Admitting: Internal Medicine

## 2020-12-05 DIAGNOSIS — I214 Non-ST elevation (NSTEMI) myocardial infarction: Secondary | ICD-10-CM

## 2020-12-05 MED ORDER — ALBUTEROL SULFATE HFA 108 (90 BASE) MCG/ACT IN AERS: 1.0000 | INHALATION_SPRAY | Freq: Four times a day (QID) | RESPIRATORY_TRACT | 1 refills | Status: DC | PRN

## 2020-12-05 NOTE — Progress Notes (Signed)
Daily Session Note  Patient Details  Name: James Peck MRN: [id number] Date of Birth: 01/28/1941 Referring Provider:   Flowsheet Row Cardiac Rehab from 11/07/2020 in [facility] Cardiac and Pulmonary Rehab  Referring Provider Bartholome Bill MD      Encounter Date: 12/05/2020  Check In:  Session Check In - 12/05/20 1025      Check-In   Supervising physician immediately available to respond to emergencies See telemetry face sheet for immediately available ER MD    Location [facility]-Cardiac & Pulmonary Rehab    Staff Present Renita Papa, RN BSN;Joseph [id number] [place], [place], ACSM CEP, Exercise Physiologist    Virtual Visit No    Medication changes reported     No    Fall or balance concerns reported    No    Warm-up and Cool-down Performed on first and last piece of equipment    Resistance Training Performed Yes    VAD Patient? No    PAD/SET Patient? No      Pain Assessment   Currently in Pain? No/denies              Social History   Tobacco Use  Smoking Status Former Smoker  . Packs/day: 1.00  . Years: 40.00  . Pack years: 40.00  . Types: Cigarettes  . Quit date: 2000  . Years since quitting: 22.3  Smokeless Tobacco Never Used    Goals Met:  Independence with exercise equipment Exercise tolerated well No report of cardiac concerns or symptoms Strength training completed today  Goals Unmet:  Not Applicable  Comments: Pt able to follow exercise prescription today without complaint.  Will continue to monitor for progression.    Dr. Emily Filbert is Medical Director for [place] and [facility].

## 2020-12-05 NOTE — Assessment & Plan Note (Signed)
Continue zyrtec, nasal rinses and steroid nasal spray.  Follow.

## 2020-12-05 NOTE — Assessment & Plan Note (Signed)
Continue anoro.  Breathing stable.   

## 2020-12-05 NOTE — Assessment & Plan Note (Signed)
Continue micardis.  Blood pressure check by me:  114/62 right arm and 110/62 left arm.  Follow pressures.  Follow metabolic panel.

## 2020-12-05 NOTE — Assessment & Plan Note (Signed)
Low carb diet and exercise. Follow met b and a1c.  

## 2020-12-05 NOTE — Telephone Encounter (Signed)
Patient called for refill for albuterol (VENTOLIN HFA) 108 (90 Base) MCG/ACT inhaler

## 2020-12-05 NOTE — Assessment & Plan Note (Signed)
Recent admission with NSTEMI 08/2020.  09/12/20 - cath reviewed. ( 50% mid/distal LAD ).  No chest pain now.  Feeling better.  Attending heart track.  Continue micardis.  Off metoprolol - low blood pressure. Follow.

## 2020-12-05 NOTE — Assessment & Plan Note (Signed)
Continue simvastatin.  Low cholesterol diet and exercise.  Follow lipid panel and liver function tests.   

## 2020-12-07 ENCOUNTER — Telehealth: Payer: Self-pay | Admitting: Internal Medicine

## 2020-12-07 ENCOUNTER — Other Ambulatory Visit: Payer: Self-pay

## 2020-12-07 DIAGNOSIS — I214 Non-ST elevation (NSTEMI) myocardial infarction: Secondary | ICD-10-CM

## 2020-12-07 DIAGNOSIS — R9389 Abnormal findings on diagnostic imaging of other specified body structures: Secondary | ICD-10-CM

## 2020-12-07 NOTE — Telephone Encounter (Signed)
Called James Peck after reviewing previous CT scan.  Seeing Dr Bernardo Heater.  CT for hematuria.  Commented on renal lesion.  Will contact Dr Bernardo Heater regarding question of need for f/u.  Also, changes in lungs noted.  Pt agreeable to f/u chest CT to confirm clear.  Order placed for CT chest.

## 2020-12-07 NOTE — Progress Notes (Signed)
Daily Session Note  Patient Details  Name: James Peck MRN: [id number] Date of Birth: 25-Jun-1941 Referring Provider:   Flowsheet Row Cardiac Rehab from 11/07/2020 in [facility] Cardiac and Pulmonary Rehab  Referring Provider Bartholome Bill MD      Encounter Date: 12/07/2020  Check In:  Session Check In - 12/07/20 0951      Check-In   Supervising physician immediately available to respond to emergencies See telemetry face sheet for immediately available ER MD    Location [facility]-Cardiac & Pulmonary Rehab    Staff Present Birdie Sons, MPA, RN;Amanda Oletta Darter, BA, ACSM CEP, Exercise Physiologist;Joseph Tessie Fass RCP,RRT,BSRT    Virtual Visit No    Medication changes reported     No    Fall or balance concerns reported    No    Tobacco Cessation No Change    Warm-up and Cool-down Performed on first and last piece of equipment    Resistance Training Performed Yes    VAD Patient? No    PAD/SET Patient? No      Pain Assessment   Currently in Pain? No/denies              Social History   Tobacco Use  Smoking Status Former Smoker  . Packs/day: 1.00  . Years: 40.00  . Pack years: 40.00  . Types: Cigarettes  . Quit date: 2000  . Years since quitting: 22.3  Smokeless Tobacco Never Used    Goals Met:  Independence with exercise equipment Exercise tolerated well No report of cardiac concerns or symptoms Strength training completed today  Goals Unmet:  Not Applicable  Comments: Pt able to follow exercise prescription today without complaint.  Will continue to monitor for progression.    Dr. Emily Filbert is Medical Director for [place] and [facility].

## 2020-12-09 ENCOUNTER — Other Ambulatory Visit: Payer: Self-pay

## 2020-12-09 ENCOUNTER — Encounter: Payer: PPO | Admitting: *Deleted

## 2020-12-09 DIAGNOSIS — I214 Non-ST elevation (NSTEMI) myocardial infarction: Secondary | ICD-10-CM

## 2020-12-09 NOTE — Progress Notes (Signed)
Daily Session Note  Patient Details  Name: James Peck MRN: [id number] Date of Birth: November 14, 1940 Referring Provider:   Flowsheet Row Cardiac Rehab from 11/07/2020 in [facility] Cardiac and Pulmonary Rehab  Referring Provider Bartholome Bill MD      Encounter Date: 12/09/2020  Check In:  Session Check In - 12/09/20 1042      Check-In   Supervising physician immediately available to respond to emergencies See telemetry face sheet for immediately available ER MD    Location [facility]-Cardiac & Pulmonary Rehab    Staff Present Hope Budds RDN, LDN;Jessica Luan Pulling, MA, RCEP, CCRP, CCET;Emaan Gary, RN, BSN, CCRP    Virtual Visit No    Medication changes reported     No    Fall or balance concerns reported    No    Warm-up and Cool-down Performed on first and last piece of equipment    Resistance Training Performed Yes    VAD Patient? No    PAD/SET Patient? No      Pain Assessment   Currently in Pain? No/denies              Social History   Tobacco Use  Smoking Status Former Smoker  . Packs/day: 1.00  . Years: 40.00  . Pack years: 40.00  . Types: Cigarettes  . Quit date: 2000  . Years since quitting: 22.3  Smokeless Tobacco Never Used    Goals Met:  Independence with exercise equipment Exercise tolerated well No report of cardiac concerns or symptoms  Goals Unmet:  Not Applicable  Comments: Pt able to follow exercise prescription today without complaint.  Will continue to monitor for progression.    Dr. Emily Filbert is Medical Director for [place] [facility].

## 2020-12-12 ENCOUNTER — Encounter: Payer: PPO | Admitting: *Deleted

## 2020-12-12 ENCOUNTER — Other Ambulatory Visit: Payer: Self-pay

## 2020-12-12 ENCOUNTER — Telehealth: Payer: Self-pay | Admitting: Internal Medicine

## 2020-12-12 DIAGNOSIS — I214 Non-ST elevation (NSTEMI) myocardial infarction: Secondary | ICD-10-CM

## 2020-12-12 NOTE — Telephone Encounter (Signed)
lft vm for pt to call ofc to sch CT. thanks 

## 2020-12-12 NOTE — Progress Notes (Signed)
Daily Session Note  Patient Details  Name: James Peck MRN: [id number] Date of Birth: 1940-09-21 Referring Provider:   Flowsheet Row Cardiac Rehab from 11/07/2020 in [facility]  Referring Provider Bartholome Bill MD      Encounter Date: 12/12/2020  Check In:  Session Check In - 12/12/20 1017      Check-In   Supervising physician immediately available to respond to emergencies See telemetry face sheet for immediately available ER MD    Location [facility]    Staff Present Renita Papa, RN BSN;Joseph [address] [place], [place], ACSM CEP, Exercise Physiologist    Virtual Visit No    Medication changes reported     No    Fall or balance concerns reported    No    Resistance Training Performed Yes    VAD Patient? No    PAD/SET Patient? No      Pain Assessment   Currently in Pain? No/denies              Social History   Tobacco Use  Smoking Status Former Smoker  . Packs/day: 1.00  . Years: 40.00  . Pack years: 40.00  . Types: Cigarettes  . Quit date: 2000  . Years since quitting: 22.3  Smokeless Tobacco Never Used    Goals Met:  Independence with exercise equipment Exercise tolerated well No report of cardiac concerns or symptoms Strength training completed today  Goals Unmet:  Not Applicable  Comments: Pt able to follow exercise prescription today without complaint.  Will continue to monitor for progression.    Dr. Emily Filbert is Medical Director for [facility] and [facility].

## 2020-12-14 ENCOUNTER — Other Ambulatory Visit: Payer: Self-pay

## 2020-12-14 ENCOUNTER — Encounter: Payer: Self-pay | Admitting: *Deleted

## 2020-12-14 DIAGNOSIS — I214 Non-ST elevation (NSTEMI) myocardial infarction: Secondary | ICD-10-CM

## 2020-12-14 NOTE — Progress Notes (Signed)
Cardiac Individual Treatment Plan  Patient Details  Name: James Peck MRN: [id number] Date of Birth: 1940/08/01 Referring Provider:   Flowsheet Row Cardiac Rehab from 11/07/2020 in [facility] Cardiac and Pulmonary Rehab  Referring Provider Bartholome Bill MD      Initial Encounter Date:  Flowsheet Row Cardiac Rehab from 11/07/2020 in [facility] Cardiac and Pulmonary Rehab  Date 11/07/20      Visit Diagnosis: NSTEMI (non-ST elevation myocardial infarction) [facility])  Patient's Home Medications on Admission:  Current Outpatient Medications:  .  albuterol (VENTOLIN HFA) 108 (90 Base) MCG/ACT inhaler, Inhale 1-2 puffs into the lungs every 6 (six) hours as needed for wheezing or shortness of breath., Disp: 18 g, Rfl: 1 .  ANORO ELLIPTA 62.5-25 MCG/INH AEPB, Inhale 1 puff into the lungs daily., Disp: 90 each, Rfl: 3 .  aspirin EC 81 MG tablet, Take 81 mg by mouth daily. , Disp: , Rfl:  .  calcium carbonate (OS-CAL - DOSED IN MG OF ELEMENTAL CALCIUM) 1250 (500 CA) MG tablet, Take 1 tablet by mouth daily. , Disp: , Rfl:  .  cetirizine (ZYRTEC) 10 MG tablet, TAKE 1 TABLET BY MOUTH DAILY, Disp: 90 tablet, Rfl: 1 .  chlorpheniramine (CHLOR-TRIMETON) 4 MG tablet, Take 4 mg by mouth 2 (two) times daily as needed for allergies., Disp: , Rfl:  .  CINNAMON PO, Take 1,200 mg by mouth 2 times daily at 12 noon and 4 pm., Disp: , Rfl:  .  clopidogrel (PLAVIX) 75 MG tablet, Take 1 tablet (75 mg total) by mouth daily., Disp: 30 tablet, Rfl: 0 .  dextromethorphan-guaiFENesin (MUCINEX DM) 30-600 MG 12hr tablet, Take 1 tablet by mouth 2 (two) times daily., Disp: , Rfl:  .  diclofenac sodium (VOLTAREN) 1 % GEL, Apply 2 g 2 (two) times daily topically. To affected joint., Disp: 300 g, Rfl: 11 .  fluticasone (FLONASE) 50 MCG/ACT nasal spray, TAKE 2 SPRAYS INTO BOTH NOSTRILS DAILY, Disp: 48 g, Rfl: 3 .  GLUCOSAMINE-CHONDROITIN PO, Take 1 capsule by mouth daily., Disp: , Rfl:  .  Misc Natural Products (BLACK CHERRY CONCENTRATE PO),  Take 1,000 mg daily by mouth., Disp: , Rfl:  .  nitroGLYCERIN (NITROSTAT) 0.4 MG SL tablet, Place 1 tablet (0.4 mg total) under the tongue every 5 (five) minutes as needed for chest pain., Disp: 30 tablet, Rfl: 0 .  Omega-3 Fatty Acids (FISH OIL) 1000 MG CAPS, Take 1,000 mg by mouth daily. , Disp: , Rfl:  .  rosuvastatin (CRESTOR) 20 MG tablet, Take 1 tablet (20 mg total) by mouth daily., Disp: 30 tablet, Rfl: 0 .  sildenafil (REVATIO) 20 MG tablet, TAKE 1 TO 2 TABLETS BY MOUTH EVERY DAY AS NEEDED, Disp: 60 tablet, Rfl: 0 .  telmisartan (MICARDIS) 20 MG tablet, TAKE ONE TABLET EVERY DAY (Patient taking differently: Take 20 mg by mouth daily.), Disp: 90 tablet, Rfl: 1  Past Medical History: Past Medical History:  Diagnosis Date  . Allergy   . Arthritis   . Coronary artery disease   . GERD (gastroesophageal reflux disease)   . History of hiatal hernia   . Hypercholesteremia   . Hypertension     Tobacco Use: Social History   Tobacco Use  Smoking Status Former Smoker  . Packs/day: 1.00  . Years: 40.00  . Pack years: 40.00  . Types: Cigarettes  . Quit date: 2000  . Years since quitting: 22.3  Smokeless Tobacco Never Used    Labs: Recent Review [employer]  for [facility] Cardiac and Pulmonary Rehab Latest Ref Rng & Units 11/06/2019 03/15/2020 07/15/2020 09/12/2020 11/28/2020   Cholestrol 0 - 200 mg/dL 155 142 140 157 123   LDLCALC 0 - 99 mg/dL 93 76 79 93 68   HDL >39.00 mg/dL 43.10 42.30 41.10 54 37.20(L)   Trlycerides 0.0 - 149.0 mg/dL 96.0 118.0 98.0 49 90.0   Hemoglobin A1c 4.6 - 6.5 % 5.9 6.3 6.2 6.2(H) 6.1       Exercise Target Goals: Exercise Program Goal: Individual exercise prescription set using results from initial 6 min walk test and THRR while considering  patient's activity barriers and safety.   Exercise Prescription Goal: Initial exercise prescription builds to 30-45 minutes a day of aerobic activity, 2-3 days per week.  Home exercise guidelines will be given  to patient during program as part of exercise prescription that the participant will acknowledge.   Education: Aerobic Exercise: - Group verbal and visual presentation on the components of exercise prescription. Introduces F.I.T.T principle from ACSM for exercise prescriptions.  Reviews F.I.T.T. principles of aerobic exercise including progression. Written material given at graduation. Flowsheet Row Cardiac Rehab from 12/14/2020 in [facility] Cardiac and Pulmonary Rehab  Date 11/16/20  Educator AS  Instruction Review Code 1- Verbalizes Understanding      Education: Resistance Exercise: - Group verbal and visual presentation on the components of exercise prescription. Introduces F.I.T.T principle from ACSM for exercise prescriptions  Reviews F.I.T.T. principles of resistance exercise including progression. Written material given at graduation. Flowsheet Row Cardiac Rehab from 12/14/2020 in [facility] Cardiac and Pulmonary Rehab  Date 11/23/20  Educator AS  Instruction Review Code 1- Verbalizes Understanding       Education: Exercise & Equipment Safety: - Individual verbal instruction and demonstration of equipment use and safety with use of the equipment. Flowsheet Row Cardiac Rehab from 12/14/2020 in [facility] Cardiac and Pulmonary Rehab  Date 11/07/20  Educator [facility]  Instruction Review Code 1- Verbalizes Understanding      Education: Exercise Physiology & General Exercise Guidelines: - Group verbal and written instruction with models to review the exercise physiology of the cardiovascular system and associated critical values. Provides general exercise guidelines with specific guidelines to those with heart or lung disease.    Education: Flexibility, Balance, Mind/Body Relaxation: - Group verbal and visual presentation with interactive activity on the components of exercise prescription. Introduces F.I.T.T principle from ACSM for exercise prescriptions. Reviews F.I.T.T. principles of flexibility and  balance exercise training including progression. Also discusses the mind body connection.  Reviews various relaxation techniques to help reduce and manage stress (i.e. Deep breathing, progressive muscle relaxation, and visualization). Balance handout provided to take home. Written material given at graduation.   Activity Barriers & Risk Stratification:  Activity Barriers & Cardiac Risk Stratification - 11/07/20 1035      Activity Barriers & Cardiac Risk Stratification   Activity Barriers Muscular Weakness;Deconditioning;Shortness of Breath;Joint Problems   torn menicus in left knee, previous r shoulder/rotator cuff injury   Cardiac Risk Stratification High           6 Minute Walk:  6 Minute Walk    Row Name 11/07/20 1034         6 Minute Walk   Phase Initial     Distance 1215 feet     Walk Time 6 minutes     # of Rest Breaks 0     MPH 2.3     METS 2.37     RPE 13  Perceived Dyspnea  2     VO2 Peak 8.29     Symptoms Yes (comment)     Comments SOB, "wore wrong shoes today", shoes bothered his ankle     Resting HR 68 bpm     Resting BP 122/64     Resting Oxygen Saturation  94 %     Exercise Oxygen Saturation  during 6 min walk 94 %     Max Ex. HR 103 bpm     Max Ex. BP 130/74     2 Minute Post BP 116/64            Oxygen Initial Assessment:   Oxygen Re-Evaluation:   Oxygen Discharge (Final Oxygen Re-Evaluation):   Initial Exercise Prescription:  Initial Exercise Prescription - 11/07/20 1000      Date of Initial Exercise RX and Referring Provider   Date 11/07/20    Referring Provider Harold Hedge MD      Treadmill   MPH 2.3    Grade 0    Minutes 15    METs 2.76      Recumbant Bike   Level 2    RPM 50    Watts 10    Minutes 15    METs 2.5      NuStep   Level 2    SPM 80    Minutes 15    METs 2.5      REL-XR   Level 1    Speed 50    Minutes 15    METs 2.5      Prescription Details   Frequency (times per week) 3    Duration Progress  to 30 minutes of continuous aerobic without signs/symptoms of physical distress      Intensity   THRR 40-80% of Max Heartrate 97-126    Ratings of Perceived Exertion 11-13    Perceived Dyspnea 0-4      Progression   Progression Continue to progress workloads to maintain intensity without signs/symptoms of physical distress.      Resistance Training   Training Prescription Yes    Weight 4 lb    Reps 10-15           Perform Capillary Blood Glucose checks as needed.  Exercise Prescription Changes:  Exercise Prescription Changes    Row Name 11/07/20 1000 11/24/20 0900 12/02/20 1000 12/06/20 1500       Response to Exercise   Blood Pressure (Admit) 122/64 114/60 -- 126/62    Blood Pressure (Exercise) 130/74 148/56 -- 142/76    Blood Pressure (Exit) 116/64 110/58 -- 114/54    Heart Rate (Admit) 68 bpm 65 bpm -- 76 bpm    Heart Rate (Exercise) 103 bpm 107 bpm -- 115 bpm    Heart Rate (Exit) 73 bpm 82 bpm -- 107 bpm    Oxygen Saturation (Admit) 94 % -- -- --    Oxygen Saturation (Exercise) 94 % -- -- --    Rating of Perceived Exertion (Exercise) 13 13 -- 12    Perceived Dyspnea (Exercise) 2 -- -- --    Symptoms SOB, ankle aggravated from shoes none -- none    Comments walk test results -- -- --    Duration -- Continue with 30 min of aerobic exercise without signs/symptoms of physical distress. -- Continue with 30 min of aerobic exercise without signs/symptoms of physical distress.    Intensity -- THRR unchanged -- THRR unchanged         Progression  Progression -- Continue to progress workloads to maintain intensity without signs/symptoms of physical distress. -- Continue to progress workloads to maintain intensity without signs/symptoms of physical distress.    Average METs -- 3.25 -- 3.41         Resistance Training   Training Prescription -- Yes -- Yes    Weight -- 4 lb -- 4 lb    Reps -- 10-15 -- 10-15         Interval Training   Interval Training -- -- -- No          Treadmill   MPH -- 2.4 -- 2.5    Grade -- 0.5 -- 1    Minutes -- 15 -- 15    METs -- 3 -- 3.26         Recumbant Bike   Level -- -- -- 2    Watts -- -- -- 34    Minutes -- -- -- 15    METs -- -- -- 3.57         REL-XR   Level -- 1 -- 2    Minutes -- 15 -- 15    METs -- 3.5 -- --         Home Exercise Plan   Plans to continue exercise at -- -- Home (comment)  walking, cross trainer Home (comment)  walking, cross trainer    Frequency -- -- Add 2 additional days to program exercise sessions. Add 2 additional days to program exercise sessions.    Initial Home Exercises Provided -- -- 12/02/20 12/02/20           Exercise Comments:   Exercise Goals and Review:  Exercise Goals    Row Name 11/07/20 1038             Exercise Goals   Increase Physical Activity Yes       Intervention Provide advice, education, support and counseling about physical activity/exercise needs.;Develop an individualized exercise prescription for aerobic and resistive training based on initial evaluation findings, risk stratification, comorbidities and participant's personal goals.       Expected Outcomes Short Term: Attend rehab on a regular basis to increase amount of physical activity.;Long Term: Add in home exercise to make exercise part of routine and to increase amount of physical activity.;Long Term: Exercising regularly at least 3-5 days a week.       Increase Strength and Stamina Yes       Intervention Provide advice, education, support and counseling about physical activity/exercise needs.;Develop an individualized exercise prescription for aerobic and resistive training based on initial evaluation findings, risk stratification, comorbidities and participant's personal goals.       Expected Outcomes Short Term: Increase workloads from initial exercise prescription for resistance, speed, and METs.;Short Term: Perform resistance training exercises routinely during rehab and add in resistance  training at home;Long Term: Improve cardiorespiratory fitness, muscular endurance and strength as measured by increased METs and functional capacity (6MWT)       Able to understand and use rate of perceived exertion (RPE) scale Yes       Intervention Provide education and explanation on how to use RPE scale       Expected Outcomes Short Term: Able to use RPE daily in rehab to express subjective intensity level;Long Term:  Able to use RPE to guide intensity level when exercising independently       Able to understand and use Dyspnea scale Yes       Intervention Provide education  and explanation on how to use Dyspnea scale       Expected Outcomes Short Term: Able to use Dyspnea scale daily in rehab to express subjective sense of shortness of breath during exertion;Long Term: Able to use Dyspnea scale to guide intensity level when exercising independently       Knowledge and understanding of Target Heart Rate Range (THRR) Yes       Intervention Provide education and explanation of THRR including how the numbers were predicted and where they are located for reference       Expected Outcomes Long Term: Able to use THRR to govern intensity when exercising independently;Short Term: Able to use daily as guideline for intensity in rehab;Short Term: Able to state/look up THRR       Able to check pulse independently Yes       Intervention Provide education and demonstration on how to check pulse in carotid and radial arteries.;Review the importance of being able to check your own pulse for safety during independent exercise       Expected Outcomes Short Term: Able to explain why pulse checking is important during independent exercise;Long Term: Able to check pulse independently and accurately       Understanding of Exercise Prescription Yes       Intervention Provide education, explanation, and written materials on patient's individual exercise prescription       Expected Outcomes Short Term: Able to explain  program exercise prescription;Long Term: Able to explain home exercise prescription to exercise independently              Exercise Goals Re-Evaluation :  Exercise Goals Re-Evaluation    Row Name 11/24/20 0929 12/02/20 1025 12/06/20 1540         Exercise Goal Re-Evaluation   Exercise Goals Review Increase Physical Activity;Increase Strength and Stamina Increase Physical Activity;Increase Strength and Stamina;Able to understand and use rate of perceived exertion (RPE) scale;Able to understand and use Dyspnea scale;Able to check pulse independently;Knowledge and understanding of Target Heart Rate Range (THRR);Understanding of Exercise Prescription Increase Physical Activity;Increase Strength and Stamina;Understanding of Exercise Prescription     Comments Correll is progressing well and has increased speed and grade on TM.  Staff will monitor progress. Reviewed home exercise with pt today.  Pt plans to walk on track at home and use cross trainer at home for exercise.  We also talked about access to staff videos on YouTube.  Reviewed THR, pulse, RPE, sign and symptoms, pulse oximetery and when to call 911 or MD.  Also discussed weather considerations and indoor options.  Pt voiced understanding. Victormanuel is doing well in rehab.  He is up to 34 watts on the bike.  We will continue to montior his progress.     Expected Outcomes Short:  attend consistently Long: build overall stamina Short: Get back to walking daily again Long: Continue to exercise independently Short: Increase workloads Long: Continue to build stamina.            Discharge Exercise Prescription (Final Exercise Prescription Changes):  Exercise Prescription Changes - 12/06/20 1500      Response to Exercise   Blood Pressure (Admit) 126/62    Blood Pressure (Exercise) 142/76    Blood Pressure (Exit) 114/54    Heart Rate (Admit) 76 bpm    Heart Rate (Exercise) 115 bpm    Heart Rate (Exit) 107 bpm    Rating of Perceived Exertion  (Exercise) 12    Symptoms none    Duration  Continue with 30 min of aerobic exercise without signs/symptoms of physical distress.    Intensity THRR unchanged      Progression   Progression Continue to progress workloads to maintain intensity without signs/symptoms of physical distress.    Average METs 3.41      Resistance Training   Training Prescription Yes    Weight 4 lb    Reps 10-15      Interval Training   Interval Training No      Treadmill   MPH 2.5    Grade 1    Minutes 15    METs 3.26      Recumbant Bike   Level 2    Watts 34    Minutes 15    METs 3.57      REL-XR   Level 2    Minutes 15      Home Exercise Plan   Plans to continue exercise at Home (comment)   walking, cross trainer   Frequency Add 2 additional days to program exercise sessions.    Initial Home Exercises Provided 12/02/20           Nutrition:  Target Goals: Understanding of nutrition guidelines, daily intake of sodium 1500mg , cholesterol 200mg , calories 30% from fat and 7% or less from saturated fats, daily to have 5 or more servings of fruits and vegetables.  Education: All About Nutrition: -Group instruction provided by verbal, written material, interactive activities, discussions, models, and posters to present general guidelines for heart healthy nutrition including fat, fiber, MyPlate, the role of sodium in heart healthy nutrition, utilization of the nutrition label, and utilization of this knowledge for meal planning. Follow up email sent as well. Written material given at graduation. Flowsheet Row Cardiac Rehab from 12/14/2020 in [facility] Cardiac and Pulmonary Rehab  Education need identified 11/07/20  Date 12/07/20  Educator Balta  Instruction Review Code 1- Verbalizes Understanding      Biometrics:  Pre Biometrics - 11/07/20 1039      Pre Biometrics   Height 5' 6.1" (1.679 m)    Weight 159 lb 6.4 oz (72.3 kg)    BMI (Calculated) 25.65    Single Leg Stand 30 seconds             Nutrition Therapy Plan and Nutrition Goals:  Nutrition Therapy & Goals - 11/23/20 0915      Nutrition Therapy   Diet Heart healhty, low Na    Drug/Food Interactions Statins/Certain Fruits    Protein (specify units) 55g    Fiber 30 grams    Whole Grain Foods 3 servings    Saturated Fats 12 max. grams    Fruits and Vegetables 8 servings/day    Sodium 1.5 grams      Personal Nutrition Goals   Nutrition Goal ST: ensure getting a rainbow of plant foods weekly  LT: continue with current diet    Comments Breakfast : breakfast bar with some fruit and yogurt - coffee (stevia and creamer - decaff) Lunch: most of the time when home sandwich (banana with lite mayo, [place], bologna on whole wheat) - stevia tea (decaff)  with some potato chips Dinner: last night - salad with grilled chicken. They eat a lot of grilled chicken, will limit red meat. He eats flounder most of the time when he eats fish. Sometimes they will have pork chops sometimes. Most of the time mostly vegetables - slaw, peas, salad greens, pintos, sweet potato, tomatoes, turnip greens, sometimes mashed potatoes. Snack: fruit Drinks: diet  coke or water or stevia tea. He tries to limit his cheese intake and he doesn't eat much pasta. His wife will be the one to cook - she use oil, but he doesn't know what kind. He reports that she cooks with very little salt. He reports going out to eat Friday nights (grilled chicken sandwich or salad) and Sunday nights will go out and get fish or grilled chicken. [place]. Discussed heart healthy eating. He would not like to make any further changes at this time.      Intervention Plan   Intervention Prescribe, educate and counsel regarding individualized specific dietary modifications aiming towards targeted core components such as weight, hypertension, lipid management, diabetes, heart failure and other comorbidities.;Nutrition handout(s) given to patient.    Expected Outcomes Short Term  Goal: Understand basic principles of dietary content, such as calories, fat, sodium, cholesterol and nutrients.;Short Term Goal: A plan has been developed with personal nutrition goals set during dietitian appointment.;Long Term Goal: Adherence to prescribed nutrition plan.           Nutrition Assessments:  MEDIFICTS Score Key:  ?70 Need to make dietary changes   40-70 Heart Healthy Diet  ? 40 Therapeutic Level Cholesterol Diet  Flowsheet Row Cardiac Rehab from 11/07/2020 in [facility] Cardiac and Pulmonary Rehab  Picture Your Plate Total Score on Admission 72     Picture Your Plate Scores:  [id number] Unhealthy dietary pattern with much room for improvement.  41-50 Dietary pattern unlikely to meet recommendations for good health and room for improvement.  51-60 More healthful dietary pattern, with some room for improvement.   >60 Healthy dietary pattern, although there may be some specific behaviors that could be improved.    Nutrition Goals Re-Evaluation:   Nutrition Goals Discharge (Final Nutrition Goals Re-Evaluation):   Psychosocial: Target Goals: Acknowledge presence or absence of significant depression and/or stress, maximize coping skills, provide positive support system. Participant is able to verbalize types and ability to use techniques and skills needed for reducing stress and depression.   Education: Stress, Anxiety, and Depression - Group verbal and visual presentation to define topics covered.  Reviews how body is impacted by stress, anxiety, and depression.  Also discusses healthy ways to reduce stress and to treat/manage anxiety and depression.  Written material given at graduation.   Education: Sleep Hygiene -Provides group verbal and written instruction about how sleep can affect your health.  Define sleep hygiene, discuss sleep cycles and impact of sleep habits. Review good sleep hygiene tips.    Initial Review & Psychosocial Screening:  Initial Psych Review &  Screening - 11/04/20 1012      Initial Review   Current issues with None Identified      Family Dynamics   Good Support System? Yes   wife and 2 kidsd live close by with their families, siblings     Barriers   Psychosocial barriers to participate in program There are no identifiable barriers or psychosocial needs.;The patient should benefit from training in stress management and relaxation.      Screening Interventions   Interventions Encouraged to exercise    Expected Outcomes Short Term goal: Utilizing psychosocial counselor, staff and physician to assist with identification of specific Stressors or current issues interfering with healing process. Setting desired goal for each stressor or current issue identified.;Long Term Goal: Stressors or current issues are controlled or eliminated.;Short Term goal: Identification and review with participant of any Quality of Life or Depression concerns  found by scoring the questionnaire.;Long Term goal: The participant improves quality of Life and PHQ9 Scores as seen by post scores and/or verbalization of changes           Quality of Life Scores:   Quality of Life - 11/07/20 1039      Quality of Life   Select Quality of Life      Quality of Life Scores   Health/Function Pre 23.07 %    Socioeconomic Pre 26.57 %    Psych/Spiritual Pre 29.14 %    Family Pre 28.3 %    GLOBAL Pre 25.81 %          Scores of 19 and below usually indicate a poorer quality of life in these areas.  A difference of  2-3 points is a clinically meaningful difference.  A difference of 2-3 points in the total score of the Quality of Life Index has been associated with significant improvement in overall quality of life, self-image, physical symptoms, and general health in studies assessing change in quality of life.  PHQ-9: Recent Review Flowsheet Data    Depression screen [facility] 2/9 12/07/2020 11/07/2020 07/01/2020 06/30/2019 02/27/2019   Decreased Interest 0 1 0 0 0   Down,  Depressed, Hopeless 0 0 0 0 0   PHQ - 2 Score 0 1 0 0 0   Altered sleeping 0 1 - - -   Tired, decreased energy 0 1 - - -   Change in appetite 0 0 - - -   Feeling bad or failure about yourself  0 0 - - -   Trouble concentrating 0 1 - - -   Moving slowly or fidgety/restless 0 2 - - -   Suicidal thoughts 0 0 - - -   PHQ-9 Score 0 6 - - -   Difficult doing work/chores Not difficult at all Not difficult at all - - -     Interpretation of Total Score  Total Score Depression Severity:  1-4 = Minimal depression, 5-9 = Mild depression, 10-14 = Moderate depression, 15-19 = Moderately severe depression, 20-27 = Severe depression   Psychosocial Evaluation and Intervention:  Psychosocial Evaluation - 11/04/20 1024      Psychosocial Evaluation & Interventions   Interventions Encouraged to exercise with the program and follow exercise prescription    Comments Havard has no barriers to attending the program. He is ready to start. He lives with his wife of 55 years. His 2 sons and their families live 1 and 2 doors down from his home. His entire family is is support system as well as his siblings and their families. He likes to work with old cars, has always raised a garden. His wife sews.  He said that keeps them busy. He states he has no stress, depression nor aniexty at this time. He should do well in the program.    Expected Outcomes STG Christoff attends all scheduled sessions.  He continues to live stress and depression free.  LTG Donney is able to continue any lifestyle changes learned in the program.    Continue Psychosocial Services  Follow up required by staff           Psychosocial Re-Evaluation:  Psychosocial Re-Evaluation    [place] Name 12/02/20 1001 12/07/20 1014           Psychosocial Re-Evaluation   Current issues with None Identified None Identified      Comments Patient reports no issues with their current mental states, sleep, stress,  depression or anxiety. Will follow up with patient in  a few weeks for any changes. Redid Kamsiyochukwu's PHQ today.  He was all clear. He is feeling better and has more energy again.  Even his wife has noticed the improvement!!      Expected Outcomes Short: Continue to exercise regularly to support mental health and notify staff of any changes. Long: maintain mental health and well being through teaching of rehab or prescribed medications independently. Short: Continue to exercise regularly to support mental health and notify staff of any changes. Long: maintain mental health and well being through teaching of rehab or prescribed medications independently.      Interventions Encouraged to attend Cardiac Rehabilitation for the exercise Encouraged to attend Cardiac Rehabilitation for the exercise      Continue Psychosocial Services  Follow up required by staff --             Psychosocial Discharge (Final Psychosocial Re-Evaluation):  Psychosocial Re-Evaluation - 12/07/20 1014      Psychosocial Re-Evaluation   Current issues with None Identified    Comments Redid Zackarey's PHQ today.  He was all clear. He is feeling better and has more energy again.  Even his wife has noticed the improvement!!    Expected Outcomes Short: Continue to exercise regularly to support mental health and notify staff of any changes. Long: maintain mental health and well being through teaching of rehab or prescribed medications independently.    Interventions Encouraged to attend Cardiac Rehabilitation for the exercise           Vocational Rehabilitation: Provide vocational rehab assistance to qualifying candidates.   Vocational Rehab Evaluation & Intervention:  Vocational Rehab - 11/04/20 1028      Initial Vocational Rehab Evaluation & Intervention   Assessment shows need for Vocational Rehabilitation No           Education: Education Goals: Education classes will be provided on a variety of topics geared toward better understanding of heart health and risk factor  modification. Participant will state understanding/return demonstration of topics presented as noted by education test scores.  Learning Barriers/Preferences:   General Cardiac Education Topics:  AED/CPR: - Group verbal and written instruction with the use of models to demonstrate the basic use of the AED with the basic ABC's of resuscitation.   Anatomy and Cardiac Procedures: - Group verbal and visual presentation and models provide information about basic cardiac anatomy and function. Reviews the testing methods done to diagnose heart disease and the outcomes of the test results. Describes the treatment choices: Medical Management, Angioplasty, or Coronary Bypass Surgery for treating various heart conditions including Myocardial Infarction, Angina, Valve Disease, and Cardiac Arrhythmias.  Written material given at graduation. Flowsheet Row Cardiac Rehab from 12/14/2020 in [facility] Cardiac and Pulmonary Rehab  Date 11/23/20  Educator SB  Instruction Review Code 1- Verbalizes Understanding      Medication Safety: - Group verbal and visual instruction to review commonly prescribed medications for heart and lung disease. Reviews the medication, class of the drug, and side effects. Includes the steps to properly store meds and maintain the prescription regimen.  Written material given at graduation. Flowsheet Row Cardiac Rehab from 12/14/2020 in [facility] Cardiac and Pulmonary Rehab  Date 12/14/20  Educator SB  Instruction Review Code 1- Verbalizes Understanding      Intimacy: - Group verbal instruction through game format to discuss how heart and lung disease can affect sexual intimacy. Written material given at graduation.[place] Row Cardiac Rehab  from 12/14/2020 in [facility] Cardiac and Pulmonary Rehab  Date 11/16/20  Educator AS  Instruction Review Code 1- Verbalizes Understanding      Know Your Numbers and Heart Failure: - Group verbal and visual instruction to discuss disease risk  factors for cardiac and pulmonary disease and treatment options.  Reviews associated critical values for Overweight/Obesity, Hypertension, Cholesterol, and Diabetes.  Discusses basics of heart failure: signs/symptoms and treatments.  Introduces Heart Failure Zone chart for action plan for heart failure.  Written material given at graduation.   Infection Prevention: - Provides verbal and written material to individual with discussion of infection control including proper hand washing and proper equipment cleaning during exercise session. Flowsheet Row Cardiac Rehab from 12/14/2020 in [facility] Cardiac and Pulmonary Rehab  Date 11/07/20  Educator [facility]  Instruction Review Code 1- Verbalizes Understanding      Falls Prevention: - Provides verbal and written material to individual with discussion of falls prevention and safety. Flowsheet Row Cardiac Rehab from 12/14/2020 in [facility] Cardiac and Pulmonary Rehab  Date 11/04/20  Educator SB  Instruction Review Code 1- Verbalizes Understanding      Other: -Provides group and verbal instruction on various topics (see comments)   Knowledge Questionnaire Score:  Knowledge Questionnaire Score - 11/07/20 1041      Knowledge Questionnaire Score   Pre Score 25/26 Education Focus: Nutrition           Core Components/Risk Factors/Patient Goals at Admission:  Personal Goals and Risk Factors at Admission - 11/07/20 1041      Core Components/Risk Factors/Patient Goals on Admission    Weight Management Yes;Weight Loss    Intervention Weight Management: Provide education and appropriate resources to help participant work on and attain dietary goals.;Weight Management: Develop a combined nutrition and exercise program designed to reach desired caloric intake, while maintaining appropriate intake of nutrient and fiber, sodium and fats, and appropriate energy expenditure required for the weight goal.;Weight Management/Obesity: Establish reasonable short term and  long term weight goals.    Admit Weight 159 lb 6.4 oz (72.3 kg)    Goal Weight: Short Term 155 lb (70.3 kg)    Goal Weight: Long Term 150 lb (68 kg)    Expected Outcomes Short Term: Continue to assess and modify interventions until short term weight is achieved;Long Term: Adherence to nutrition and physical activity/exercise program aimed toward attainment of established weight goal;Understanding recommendations for meals to include 15-35% energy as protein, 25-35% energy from fat, 35-60% energy from carbohydrates, less than 200mg  of dietary cholesterol, 20-35 gm of total fiber daily;Weight Loss: Understanding of general recommendations for a balanced deficit meal plan, which promotes 1-2 lb weight loss per week and includes a negative energy balance of [id number] kcal/d    Hypertension Yes    Intervention Provide education on lifestyle modifcations including regular physical activity/exercise, weight management, moderate sodium restriction and increased consumption of fresh fruit, vegetables, and low fat dairy, alcohol moderation, and smoking cessation.;Monitor prescription use compliance.    Expected Outcomes Short Term: Continued assessment and intervention until BP is < 140/47mm HG in hypertensive participants. < 130/63mm HG in hypertensive participants with diabetes, heart failure or chronic kidney disease.;Long Term: Maintenance of blood pressure at goal levels.    Lipids Yes    Intervention Provide education and support for participant on nutrition & aerobic/resistive exercise along with prescribed medications to achieve LDL 70mg , HDL >40mg .    Expected Outcomes Short Term: Participant states understanding of desired cholesterol values and is compliant with  medications prescribed. Participant is following exercise prescription and nutrition guidelines.;Long Term: Cholesterol controlled with medications as prescribed, with individualized exercise RX and with personalized nutrition plan. Value goals:  LDL < 70mg , HDL > 40 mg.           Education:Diabetes - Individual verbal and written instruction to review signs/symptoms of diabetes, desired ranges of glucose level fasting, after meals and with exercise. Acknowledge that pre and post exercise glucose checks will be done for 3 sessions at entry of program.   Core Components/Risk Factors/Patient Goals Review:   Goals and Risk Factor Review    Row Name 12/02/20 0957             Core Components/Risk Factors/Patient Goals Review   Personal Goals Review Weight Management/Obesity;Hypertension;Lipids       Review Rion checks his blood pressure at home. He is monitoring it daily. He has lost 2 pounds since the start if the program. He wants to get down 5 more pounds.       Expected Outcomes Short: Lose 5 more pounds. Long: maintain weight independently              Core Components/Risk Factors/Patient Goals at Discharge (Final Review):   Goals and Risk Factor Review - 12/02/20 0957      Core Components/Risk Factors/Patient Goals Review   Personal Goals Review Weight Management/Obesity;Hypertension;Lipids    Review Geo checks his blood pressure at home. He is monitoring it daily. He has lost 2 pounds since the start if the program. He wants to get down 5 more pounds.    Expected Outcomes Short: Lose 5 more pounds. Long: maintain weight independently           ITP Comments:  ITP Comments    Row Name 11/04/20 1029 11/07/20 1034 11/16/20 0804 12/14/20 1018     ITP Comments Virtual orientation call completed today. he has an appointment on Date: 11/07/2020 for EP eval and gym Orientation.  Documentation of diagnosis [facility] be found in [facility] Date: 09/26/2020. Completed 6MWT and gym orientation. Initial ITP created and sent for review to Dr. Emily Filbert, Medical Director. 30 Day review completed. Medical Director ITP review done, changes made as directed, and signed approval by Medical Director.  New to program 30 Day review completed.  Medical Director ITP review done, changes made as directed, and signed approval by Medical Director.           Comments:

## 2020-12-14 NOTE — Progress Notes (Signed)
Daily Session Note  Patient Details  Name: LOUISE VICTORY MRN: [id number] Date of Birth: [birth year] Referring Provider:   Flowsheet Row Cardiac Rehab from 11/07/2020 in [facility] Cardiac and Pulmonary Rehab  Referring Provider Bartholome Bill MD      Encounter Date: 12/14/2020  Check In:  Session Check In - 12/14/20 0947      Check-In   Supervising physician immediately available to respond to emergencies See telemetry face sheet for immediately available ER MD    Location [facility]-Cardiac & Pulmonary Rehab    Staff Present Birdie Sons, MPA, RN;Amanda Oletta Darter, BA, ACSM CEP, Exercise Physiologist;Joseph Tessie Fass RCP,RRT,BSRT    Virtual Visit No    Medication changes reported     No    Fall or balance concerns reported    No    Tobacco Cessation No Change    Warm-up and Cool-down Performed on first and last piece of equipment    Resistance Training Performed Yes    VAD Patient? No    PAD/SET Patient? No      Pain Assessment   Currently in Pain? No/denies              Social History   Tobacco Use  Smoking Status Former Smoker  . Packs/day: 1.00  . Years: 40.00  . Pack years: 40.00  . Types: Cigarettes  . Quit date: 2000  . Years since quitting: 22.3  Smokeless Tobacco Never Used    Goals Met:  Independence with exercise equipment Exercise tolerated well No report of cardiac concerns or symptoms Strength training completed today  Goals Unmet:  Not Applicable  Comments: Pt able to follow exercise prescription today without complaint.  Will continue to monitor for progression.    Dr. Emily Filbert is Medical Director for [place] [facility] Pulmonary Rehabilitation.

## 2020-12-16 ENCOUNTER — Encounter: Payer: PPO | Admitting: *Deleted

## 2020-12-16 ENCOUNTER — Other Ambulatory Visit: Payer: Self-pay

## 2020-12-16 DIAGNOSIS — I214 Non-ST elevation (NSTEMI) myocardial infarction: Secondary | ICD-10-CM | POA: Diagnosis not present

## 2020-12-16 NOTE — Progress Notes (Signed)
Daily Session Note  Patient Details  Name: James Peck MRN: [id number] Date of Birth: 03/08/1941 Referring Provider:   Flowsheet Row Cardiac Rehab from 11/07/2020 in [facility] Cardiac and Pulmonary Rehab  Referring Provider Bartholome Bill MD      Encounter Date: 12/16/2020  Check In:  Session Check In - 12/16/20 0956      Check-In   Supervising physician immediately available to respond to emergencies See telemetry face sheet for immediately available ER MD    Location [facility]-Cardiac & Pulmonary Rehab    Staff Present Renita Papa, RN BSN;Joseph [address] [place], [place], RCEP, CCRP, CCET    Virtual Visit No    Medication changes reported     No    Fall or balance concerns reported    No    Warm-up and Cool-down Performed on first and last piece of equipment    Resistance Training Performed Yes    VAD Patient? No    PAD/SET Patient? No      Pain Assessment   Currently in Pain? No/denies              Social History   Tobacco Use  Smoking Status Former Smoker  . Packs/day: 1.00  . Years: 40.00  . Pack years: 40.00  . Types: Cigarettes  . Quit date: 2000  . Years since quitting: 22.3  Smokeless Tobacco Never Used    Goals Met:  Independence with exercise equipment Exercise tolerated well No report of cardiac concerns or symptoms Strength training completed today  Goals Unmet:  Not Applicable  Comments: Pt able to follow exercise prescription today without complaint.  Will continue to monitor for progression.    Dr. Emily Filbert is Medical Director for [place] and [facility].

## 2020-12-18 ENCOUNTER — Other Ambulatory Visit: Payer: Self-pay | Admitting: Urology

## 2020-12-18 DIAGNOSIS — N281 Cyst of kidney, acquired: Secondary | ICD-10-CM

## 2020-12-19 ENCOUNTER — Encounter: Payer: PPO | Admitting: *Deleted

## 2020-12-19 ENCOUNTER — Other Ambulatory Visit: Payer: Self-pay

## 2020-12-19 DIAGNOSIS — I214 Non-ST elevation (NSTEMI) myocardial infarction: Secondary | ICD-10-CM

## 2020-12-19 NOTE — Progress Notes (Signed)
Daily Session Note  Patient Details  Name: James Peck MRN: [id number] Date of Birth: Aug 27, 1940 Referring Provider:   Flowsheet Row Cardiac Rehab from 11/07/2020 in [facility] Cardiac and Pulmonary Rehab  Referring Provider Bartholome Bill MD      Encounter Date: 12/19/2020  Check In:  Session Check In - 12/19/20 1028      Check-In   Supervising physician immediately available to respond to emergencies See telemetry face sheet for immediately available ER MD    Location [facility]-Cardiac & Pulmonary Rehab    Staff Present Renita Papa, RN BSN;Joseph [address] [place], [place], ACSM CEP, Exercise Physiologist    Virtual Visit No    Medication changes reported     No    Fall or balance concerns reported    No    Warm-up and Cool-down Performed on first and last piece of equipment    Resistance Training Performed Yes    VAD Patient? No    PAD/SET Patient? No      Pain Assessment   Currently in Pain? No/denies              Social History   Tobacco Use  Smoking Status Former Smoker  . Packs/day: 1.00  . Years: 40.00  . Pack years: 40.00  . Types: Cigarettes  . Quit date: 2000  . Years since quitting: 22.4  Smokeless Tobacco Never Used    Goals Met:  Independence with exercise equipment Exercise tolerated well No report of cardiac concerns or symptoms Strength training completed today  Goals Unmet:  Not Applicable  Comments: Pt able to follow exercise prescription today without complaint.  Will continue to monitor for progression.    Dr. Emily Filbert is Medical Director for [place].  Dr. Ottie Glazier is Medical Director for [facility] Pulmonary Rehabilitation.

## 2020-12-21 ENCOUNTER — Other Ambulatory Visit: Payer: Self-pay

## 2020-12-21 DIAGNOSIS — I214 Non-ST elevation (NSTEMI) myocardial infarction: Secondary | ICD-10-CM

## 2020-12-21 NOTE — Progress Notes (Signed)
Daily Session Note  Patient Details  Name: KARIS EMIG MRN: [id number] Date of Birth: 1941-06-16 Referring Provider:   Flowsheet Row Cardiac Rehab from 11/07/2020 in [facility] Cardiac and Pulmonary Rehab  Referring Provider Bartholome Bill MD      Encounter Date: 12/21/2020  Check In:  Session Check In - 12/21/20 1106      Check-In   Supervising physician immediately available to respond to emergencies See telemetry face sheet for immediately available ER MD    Location [facility]-Cardiac & Pulmonary Rehab    Staff Present Birdie Sons, MPA, RN;Amanda Sommer, BA, ACSM CEP, Exercise Physiologist;Melissa Caiola RDN, LDN;Joseph [place]    Virtual Visit No    Medication changes reported     No    Fall or balance concerns reported    No    Tobacco Cessation No Change    Warm-up and Cool-down Performed on first and last piece of equipment    Resistance Training Performed Yes    VAD Patient? No    PAD/SET Patient? No      Pain Assessment   Currently in Pain? No/denies              Social History   Tobacco Use  Smoking Status Former Smoker  . Packs/day: 1.00  . Years: 40.00  . Pack years: 40.00  . Types: Cigarettes  . Quit date: 2000  . Years since quitting: 22.4  Smokeless Tobacco Never Used    Goals Met:  Independence with exercise equipment Exercise tolerated well No report of cardiac concerns or symptoms Strength training completed today  Goals Unmet:  Not Applicable  Comments: Pt able to follow exercise prescription today without complaint.  Will continue to monitor for progression.    Dr. Emily Filbert is Medical Director for [place].  Dr. Ottie Glazier is Medical Director for [facility] Pulmonary Rehabilitation.

## 2020-12-23 ENCOUNTER — Encounter: Payer: PPO | Admitting: *Deleted

## 2020-12-23 ENCOUNTER — Other Ambulatory Visit: Payer: Self-pay

## 2020-12-23 DIAGNOSIS — I214 Non-ST elevation (NSTEMI) myocardial infarction: Secondary | ICD-10-CM

## 2020-12-23 NOTE — Progress Notes (Signed)
Daily Session Note  Patient Details  Name: James Peck MRN: [id number] Date of Birth: 1940-11-19 Referring Provider:   Flowsheet Row Cardiac Rehab from 11/07/2020 in [facility] Cardiac and Pulmonary Rehab  Referring Provider Bartholome Bill MD      Encounter Date: 12/23/2020  Check In:  Session Check In - 12/23/20 0943      Check-In   Supervising physician immediately available to respond to emergencies See telemetry face sheet for immediately available ER MD    Location [facility]-Cardiac & Pulmonary Rehab    Staff Present Renita Papa, RN Margurite Auerbach, MS, ASCM CEP, Exercise Physiologist;Jessica Luan [place], MA, RCEP, CCRP, CCET    Virtual Visit No    Medication changes reported     No    Fall or balance concerns reported    No    Warm-up and Cool-down Performed on first and last piece of equipment    Resistance Training Performed Yes    VAD Patient? No    PAD/SET Patient? No      Pain Assessment   Currently in Pain? No/denies              Social History   Tobacco Use  Smoking Status Former Smoker  . Packs/day: 1.00  . Years: 40.00  . Pack years: 40.00  . Types: Cigarettes  . Quit date: 2000  . Years since quitting: 22.4  Smokeless Tobacco Never Used    Goals Met:  Independence with exercise equipment Exercise tolerated well No report of cardiac concerns or symptoms Strength training completed today  Goals Unmet:  Not Applicable  Comments: Pt able to follow exercise prescription today without complaint.  Will continue to monitor for progression.    Dr. Emily Filbert is Medical Director for [place].  Dr. Ottie Glazier is Medical Director for [facility] Pulmonary Rehabilitation.

## 2020-12-27 ENCOUNTER — Ambulatory Visit
Admission: RE | Admit: 2020-12-27 | Discharge: 2020-12-27 | Disposition: A | Discharge status: Home / Self Care | Payer: PPO | Source: Ambulatory Visit | Attending: Internal Medicine | Admitting: Internal Medicine

## 2020-12-27 ENCOUNTER — Telehealth: Payer: Self-pay | Admitting: *Deleted

## 2020-12-27 ENCOUNTER — Other Ambulatory Visit: Payer: Self-pay | Admitting: Internal Medicine

## 2020-12-27 ENCOUNTER — Other Ambulatory Visit: Payer: Self-pay

## 2020-12-27 DIAGNOSIS — J432 Centrilobular emphysema: Secondary | ICD-10-CM | POA: Diagnosis not present

## 2020-12-27 DIAGNOSIS — N281 Cyst of kidney, acquired: Secondary | ICD-10-CM | POA: Diagnosis not present

## 2020-12-27 DIAGNOSIS — R9389 Abnormal findings on diagnostic imaging of other specified body structures: Secondary | ICD-10-CM | POA: Insufficient documentation

## 2020-12-27 DIAGNOSIS — I700 Atherosclerosis of aorta: Secondary | ICD-10-CM | POA: Diagnosis not present

## 2020-12-27 DIAGNOSIS — N289 Disorder of kidney and ureter, unspecified: Secondary | ICD-10-CM | POA: Diagnosis not present

## 2020-12-27 DIAGNOSIS — J479 Bronchiectasis, uncomplicated: Secondary | ICD-10-CM | POA: Diagnosis not present

## 2020-12-27 DIAGNOSIS — G458 Other transient cerebral ischemic attacks and related syndromes: Secondary | ICD-10-CM | POA: Diagnosis not present

## 2020-12-27 DIAGNOSIS — I2510 Atherosclerotic heart disease of native coronary artery without angina pectoris: Secondary | ICD-10-CM | POA: Diagnosis not present

## 2020-12-27 NOTE — Telephone Encounter (Signed)
Notified patient as instructed, patient pleased °

## 2020-12-27 NOTE — Telephone Encounter (Signed)
-----   Message from Abbie Sons, MD sent at 12/27/2020  2:07 PM EDT ----- The previously identified small renal lesion is not well seen on the most recent CT however based on renal cyst classification the study from June/18/2021 is a benign cyst.  No further imaging is needed

## 2020-12-28 ENCOUNTER — Encounter: Payer: PPO | Attending: Cardiology | Admitting: *Deleted

## 2020-12-28 DIAGNOSIS — I214 Non-ST elevation (NSTEMI) myocardial infarction: Secondary | ICD-10-CM | POA: Insufficient documentation

## 2020-12-28 NOTE — Progress Notes (Signed)
Daily Session Note  Patient Details  Name: James Peck MRN: [id number] Date of Birth: 07-Nov-1940 Referring Provider:   Flowsheet Row Cardiac Rehab from 11/07/2020 in [facility] Cardiac and Pulmonary Rehab  Referring Provider Bartholome Bill MD      Encounter Date: 12/28/2020  Check In:  Session Check In - 12/28/20 1000      Check-In   Supervising physician immediately available to respond to emergencies See telemetry face sheet for immediately available ER MD    Location [facility]-Cardiac & Pulmonary Rehab    Staff Present Hope Budds RDN, Rowe Pavy, BA, ACSM CEP, Exercise Physiologist;Meredith Sherryll Burger, RN BSN;Ahnna Dungan, RN, BSN, CCRP    Virtual Visit No    Medication changes reported     No    Fall or balance concerns reported    No    Warm-up and Cool-down Performed on first and last piece of equipment    Resistance Training Performed Yes    VAD Patient? No    PAD/SET Patient? No      Pain Assessment   Currently in Pain? No/denies              Social History   Tobacco Use  Smoking Status Former Smoker  . Packs/day: 1.00  . Years: 40.00  . Pack years: 40.00  . Types: Cigarettes  . Quit date: 2000  . Years since quitting: 22.4  Smokeless Tobacco Never Used    Goals Met:  Independence with exercise equipment Exercise tolerated well No report of cardiac concerns or symptoms  Goals Unmet:  Not Applicable  Comments: Pt able to follow exercise prescription today without complaint.  Will continue to monitor for progression.    Dr. Emily Filbert is Medical Director for [place].  Dr. Ottie Glazier is Medical Director for [facility] Pulmonary Rehabilitation.

## 2020-12-30 ENCOUNTER — Other Ambulatory Visit: Payer: Self-pay

## 2020-12-30 ENCOUNTER — Encounter: Payer: PPO | Admitting: *Deleted

## 2020-12-30 ENCOUNTER — Telehealth: Payer: Self-pay

## 2020-12-30 DIAGNOSIS — I214 Non-ST elevation (NSTEMI) myocardial infarction: Secondary | ICD-10-CM

## 2020-12-30 NOTE — Progress Notes (Signed)
Daily Session Note  Patient Details  Name: James Peck MRN: [id number] Date of Birth: October 24, 1940 Referring Provider:   Flowsheet Row Cardiac Rehab from 11/07/2020 in [facility] Cardiac and Pulmonary Rehab  Referring Provider Bartholome Bill MD      Encounter Date: 12/30/2020  Check In:  Session Check In - 12/30/20 1024      Check-In   Supervising physician immediately available to respond to emergencies See telemetry face sheet for immediately available ER MD    Location [facility]-Cardiac & Pulmonary Rehab    Staff Present Heath Lark, RN, BSN, CCRP;Jessica [place], MA, RCEP, CCRP, CCET;Joseph [place] RCP,RRT,BSRT    Virtual Visit No    Medication changes reported     No    Fall or balance concerns reported    No    Warm-up and Cool-down Performed on first and last piece of equipment    Resistance Training Performed Yes    VAD Patient? No    PAD/SET Patient? No      Pain Assessment   Currently in Pain? No/denies              Social History   Tobacco Use  Smoking Status Former Smoker  . Packs/day: 1.00  . Years: 40.00  . Pack years: 40.00  . Types: Cigarettes  . Quit date: 2000  . Years since quitting: 22.4  Smokeless Tobacco Never Used    Goals Met:  Independence with exercise equipment Exercise tolerated well No report of cardiac concerns or symptoms  Goals Unmet:  Not Applicable  Comments: Pt able to follow exercise prescription today without complaint.  Will continue to monitor for progression.    Dr. Emily Filbert is Medical Director for [place].  Dr. Ottie Glazier is Medical Director for [facility] Pulmonary Rehabilitation.

## 2020-12-30 NOTE — Telephone Encounter (Signed)
Patient aware of results.

## 2020-12-30 NOTE — Telephone Encounter (Signed)
LMTCB

## 2020-12-30 NOTE — Telephone Encounter (Signed)
PT called to return missed call about lab results

## 2020-12-30 NOTE — Telephone Encounter (Signed)
LMTCB to discuss CT scan results.

## 2020-12-31 ENCOUNTER — Telehealth: Payer: Self-pay | Admitting: Internal Medicine

## 2020-12-31 ENCOUNTER — Other Ambulatory Visit: Payer: Self-pay | Admitting: Internal Medicine

## 2020-12-31 DIAGNOSIS — I739 Peripheral vascular disease, unspecified: Secondary | ICD-10-CM

## 2020-12-31 NOTE — Progress Notes (Signed)
Order placed for referral to [facility] °

## 2020-12-31 NOTE — Telephone Encounter (Signed)
-----   Message from Katha Cabal, MD sent at 12/28/2020  9:04 AM EDT ----- Regarding: RE: question Hi Manika Hast  Happy to see him  Nothing more to do right now.  He will only need to have a stent placed if he is symptomatic.  Just make sure your MA's are taking his BP in the other arm.  :)  Marya Amsler ----- Message ----- From: Einar Pheasant, MD Sent: 12/28/2020   5:26 AM EDT To: Katha Cabal, MD Subject: question                                       Mr Mangano had a follow up chest CT recently for abnormal lung findings.  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 Calcified aortic atherosclerosis. Severe calcified coronary artery atherosclerosis.  He sees cardiology.  Regarding his subclavian artery findings, I was planning to refer him to you for further evaluation.  Is there anything more I need to do?    Thank you for your help.  Leaann Nevils

## 2021-01-02 ENCOUNTER — Other Ambulatory Visit: Payer: Self-pay

## 2021-01-02 DIAGNOSIS — I214 Non-ST elevation (NSTEMI) myocardial infarction: Secondary | ICD-10-CM

## 2021-01-02 NOTE — Progress Notes (Signed)
Daily Session Note  Patient Details  Name: James Peck MRN: [id number] Date of Birth: 09-01-1940 Referring Provider:   Flowsheet Row Cardiac Rehab from 11/07/2020 in [facility] Cardiac and Pul[facility] Rehab  Referring Provider Bartholome Bill MD      Encounter Date: 01/02/2021  Check In:  Session Check In - 01/02/21 1358      Check-In   Supervising physician immediately available to respond to emergencies See telemetry face sheet for immediately available ER MD    Location [facility]-Cardiac & Pulmonary Rehab    Staff Present Birdie Sons, MPA, Mauricia Area, BS, ACSM CEP, Exercise Physiologist;Susanne Bice, RN, BSN, CCRP;Joseph Hood RCP,RRT,BSRT    Virtual Visit No    Medication changes reported     No    Fall or balance concerns reported    No    Tobacco Cessation No Change    Warm-up and Cool-down Performed on first and last piece of equipment    Resistance Training Performed Yes    VAD Patient? No    PAD/SET Patient? No      Pain Assessment   Currently in Pain? No/denies              Social History   Tobacco Use  Smoking Status Former Smoker  . Packs/day: 1.00  . Years: 40.00  . Pack years: 40.00  . Types: Cigarettes  . Quit date: 2000  . Years since quitting: 22.4  Smokeless Tobacco Never Used    Goals Met:  Independence with exercise equipment Exercise tolerated well No report of cardiac concerns or symptoms Strength training completed today  Goals Unmet:  Not Applicable  Comments: Pt able to follow exercise prescription today without complaint.  Will continue to monitor for progression.    Dr. Emily Filbert is Medical Director for [place].  Dr. Ottie Glazier is Medical Director for [facility] Pulmonary Rehabilitation.

## 2021-01-09 DIAGNOSIS — I214 Non-ST elevation (NSTEMI) myocardial infarction: Secondary | ICD-10-CM

## 2021-01-11 ENCOUNTER — Other Ambulatory Visit: Payer: Self-pay

## 2021-01-11 ENCOUNTER — Encounter: Payer: Self-pay | Admitting: *Deleted

## 2021-01-11 DIAGNOSIS — I214 Non-ST elevation (NSTEMI) myocardial infarction: Secondary | ICD-10-CM | POA: Diagnosis not present

## 2021-01-11 NOTE — Progress Notes (Signed)
Daily Session Note  Patient Details  Name: James Peck MRN: [id number] Date of Birth: 06/20/1941 Referring Provider:   Flowsheet Row Cardiac Rehab from 11/07/2020 in [facility] Cardiac and Pulmonary Rehab  Referring Provider Bartholome Bill MD       Encounter Date: 01/11/2021  Check In:  Session Check In - 01/11/21 0950       Check-In   Supervising physician immediately available to respond to emergencies See telemetry face sheet for immediately available ER MD    Location [facility]-Cardiac & Pulmonary Rehab    Staff Present Birdie Sons, MPA, RN;Melissa Caiola RDN, LDN;Joseph Tessie Fass RCP,RRT,BSRT    Virtual Visit No    Medication changes reported     No    Fall or balance concerns reported    No    Tobacco Cessation No Change    Warm-up and Cool-down Performed on first and last piece of equipment    Resistance Training Performed Yes    VAD Patient? No    PAD/SET Patient? No      Pain Assessment   Currently in Pain? No/denies                Social History   Tobacco Use  Smoking Status Former   Packs/day: 1.00   Years: 40.00   Pack years: 40.00   Types: Cigarettes   Quit date: 2000   Years since quitting: 22.4  Smokeless Tobacco Never    Goals Met:  Independence with exercise equipment Exercise tolerated well No report of cardiac concerns or symptoms Strength training completed today  Goals Unmet:  Not Applicable  Comments: Pt able to follow exercise prescription today without complaint.  Will continue to monitor for progression.    Dr. Emily Filbert is Medical Director for [place].  Dr. Ottie Glazier is Medical Director for [facility] Pulmonary Rehabilitation.

## 2021-01-11 NOTE — Progress Notes (Signed)
Cardiac Individual Treatment Plan  Patient Details  Name: James Peck MRN: [id number] Date of Birth: 04-20-1941 Referring Provider:   Flowsheet Row Cardiac Rehab from 11/07/2020 in [facility] Cardiac and Pulmonary Rehab  Referring Provider Bartholome Bill MD       Initial Encounter Date:  Flowsheet Row Cardiac Rehab from 11/07/2020 in [facility] Cardiac and Pulmonary Rehab  Date 11/07/20       Visit Diagnosis: NSTEMI (non-ST elevation myocardial infarction) [facility])  Patient's Home Medications on Admission:  Current Outpatient Medications:    albuterol (VENTOLIN HFA) 108 (90 Base) MCG/ACT inhaler, Inhale 1-2 puffs into the lungs every 6 (six) hours as needed for wheezing or shortness of breath., Disp: 18 g, Rfl: 1   ANORO ELLIPTA 62.5-25 MCG/INH AEPB, Inhale 1 puff into the lungs daily., Disp: 90 each, Rfl: 3   aspirin EC 81 MG tablet, Take 81 mg by mouth daily. , Disp: , Rfl:    calcium carbonate (OS-CAL - DOSED IN MG OF ELEMENTAL CALCIUM) 1250 (500 CA) MG tablet, Take 1 tablet by mouth daily. , Disp: , Rfl:    cetirizine (ZYRTEC) 10 MG tablet, TAKE 1 TABLET BY MOUTH DAILY, Disp: 90 tablet, Rfl: 1   chlorpheniramine (CHLOR-TRIMETON) 4 MG tablet, Take 4 mg by mouth 2 (two) times daily as needed for allergies., Disp: , Rfl:    CINNAMON PO, Take 1,200 mg by mouth 2 times daily at 12 noon and 4 pm., Disp: , Rfl:    clopidogrel (PLAVIX) 75 MG tablet, Take 1 tablet (75 mg total) by mouth daily., Disp: 30 tablet, Rfl: 0   dextromethorphan-guaiFENesin (MUCINEX DM) 30-600 MG 12hr tablet, Take 1 tablet by mouth 2 (two) times daily., Disp: , Rfl:    diclofenac sodium (VOLTAREN) 1 % GEL, Apply 2 g 2 (two) times daily topically. To affected joint., Disp: 300 g, Rfl: 11   fluticasone (FLONASE) 50 MCG/ACT nasal spray, TAKE 2 SPRAYS INTO BOTH NOSTRILS DAILY, Disp: 16 g, Rfl: 3   GLUCOSAMINE-CHONDROITIN PO, Take 1 capsule by mouth daily., Disp: , Rfl:    Misc Natural Products (BLACK CHERRY CONCENTRATE PO), Take  1,000 mg daily by mouth., Disp: , Rfl:    nitroGLYCERIN (NITROSTAT) 0.4 MG SL tablet, Place 1 tablet (0.4 mg total) under the tongue every 5 (five) minutes as needed for chest pain., Disp: 30 tablet, Rfl: 0   Omega-3 Fatty Acids (FISH OIL) 1000 MG CAPS, Take 1,000 mg by mouth daily. , Disp: , Rfl:    rosuvastatin (CRESTOR) 20 MG tablet, Take 1 tablet (20 mg total) by mouth daily., Disp: 30 tablet, Rfl: 0   sildenafil (REVATIO) 20 MG tablet, TAKE 1 TO 2 TABLETS BY MOUTH EVERY DAY AS NEEDED, Disp: 60 tablet, Rfl: 0   telmisartan (MICARDIS) 20 MG tablet, TAKE ONE TABLET EVERY DAY (Patient taking differently: Take 20 mg by mouth daily.), Disp: 90 tablet, Rfl: 1  Past Medical History: Past Medical History:  Diagnosis Date   Allergy    Arthritis    Coronary artery disease    GERD (gastroesophageal reflux disease)    History of hiatal hernia    Hypercholesteremia    Hypertension     Tobacco Use: Social History   Tobacco Use  Smoking Status Former   Packs/day: 1.00   Years: 40.00   Pack years: 40.00   Types: Cigarettes   Quit date: 2000   Years since quitting: 22.4  Smokeless Tobacco Never    Labs: Recent Review Scientist, physiological  Labs for ITP Cardiac and Pulmonary Rehab Latest Ref Rng & Units 11/06/2019 03/15/2020 07/15/2020 09/12/2020 11/28/2020   Cholestrol 0 - 200 mg/dL 155 142 140 157 123   LDLCALC 0 - 99 mg/dL 93 76 79 93 68   HDL >39.00 mg/dL 43.10 42.30 41.10 54 37.20(L)   Trlycerides 0.0 - 149.0 mg/dL 96.0 118.0 98.0 49 90.0   Hemoglobin A1c 4.6 - 6.5 % 5.9 6.3 6.2 6.2(H) 6.1        Exercise Target Goals: Exercise Program Goal: Individual exercise prescription set using results from initial 6 min walk test and THRR while considering  patient's activity barriers and safety.   Exercise Prescription Goal: Initial exercise prescription builds to 30-45 minutes a day of aerobic activity, 2-3 days per week.  Home exercise guidelines will be given to patient during program as  part of exercise prescription that the participant will acknowledge.   Education: Aerobic Exercise: - Group verbal and visual presentation on the components of exercise prescription. Introduces F.I.T.T principle from ACSM for exercise prescriptions.  Reviews F.I.T.T. principles of aerobic exercise including progression. Written material given at graduation. Flowsheet Row Cardiac Rehab from 12/28/2020 in [facility] Cardiac and Pulmonary Rehab  Date 11/16/20  Educator AS  Instruction Review Code 1- Verbalizes Understanding       Education: Resistance Exercise: - Group verbal and visual presentation on the components of exercise prescription. Introduces F.I.T.T principle from ACSM for exercise prescriptions  Reviews F.I.T.T. principles of resistance exercise including progression. Written material given at graduation. Flowsheet Row Cardiac Rehab from 12/28/2020 in [facility] Cardiac and Pulmonary Rehab  Date 11/23/20  Educator AS  Instruction Review Code 1- Verbalizes Understanding        Education: Exercise & Equipment Safety: - Individual verbal instruction and demonstration of equipment use and safety with use of the equipment. Flowsheet Row Cardiac Rehab from 12/28/2020 in [facility] Cardiac and Pulmonary Rehab  Date 11/07/20  Educator [facility]  Instruction Review Code 1- Verbalizes Understanding       Education: Exercise Physiology & General Exercise Guidelines: - Group verbal and written instruction with models to review the exercise physiology of the cardiovascular system and associated critical values. Provides general exercise guidelines with specific guidelines to those with heart or lung disease.    Education: Flexibility, Balance, Mind/Body Relaxation: - Group verbal and visual presentation with interactive activity on the components of exercise prescription. Introduces F.I.T.T principle from ACSM for exercise prescriptions. Reviews F.I.T.T. principles of flexibility and balance exercise training  including progression. Also discusses the mind body connection.  Reviews various relaxation techniques to help reduce and manage stress (i.e. Deep breathing, progressive muscle relaxation, and visualization). Balance handout provided to take home. Written material given at graduation.   Activity Barriers & Risk Stratification:  Activity Barriers & Cardiac Risk Stratification - 11/07/20 1035       Activity Barriers & Cardiac Risk Stratification   Activity Barriers Muscular Weakness;Deconditioning;Shortness of Breath;Joint Problems   torn menicus in left knee, previous r shoulder/rotator cuff injury   Cardiac Risk Stratification High             6 Minute Walk:  6 Minute Walk     Row Name 11/07/20 1034         6 Minute Walk   Phase Initial     Distance 1215 feet     Walk Time 6 minutes     # of Rest Breaks 0     MPH 2.3     METS 2.37  RPE 13     Perceived Dyspnea  2     VO2 Peak 8.29     Symptoms Yes (comment)     Comments SOB, "wore wrong shoes today", shoes bothered his ankle     Resting HR 68 bpm     Resting BP 122/64     Resting Oxygen Saturation  94 %     Exercise Oxygen Saturation  during 6 min walk 94 %     Max Ex. HR 103 bpm     Max Ex. BP 130/74     2 Minute Post BP 116/64              Oxygen Initial Assessment:   Oxygen Re-Evaluation:   Oxygen Discharge (Final Oxygen Re-Evaluation):   Initial Exercise Prescription:  Initial Exercise Prescription - 11/07/20 1000       Date of Initial Exercise RX and Referring Provider   Date 11/07/20    Referring Provider Bartholome Bill MD      Treadmill   MPH 2.3    Grade 0    Minutes 15    METs 2.76      Recumbant Bike   Level 2    RPM 50    Watts 10    Minutes 15    METs 2.5      NuStep   Level 2    SPM 80    Minutes 15    METs 2.5      REL-XR   Level 1    Speed 50    Minutes 15    METs 2.5      Prescription Details   Frequency (times per week) 3    Duration Progress to 30  minutes of continuous aerobic without signs/symptoms of physical distress      Intensity   THRR 40-80% of Max Heartrate 97-126    Ratings of Perceived Exertion 11-13    Perceived Dyspnea 0-4      Progression   Progression Continue to progress workloads to maintain intensity without signs/symptoms of physical distress.      Resistance Training   Training Prescription Yes    Weight 4 lb    Reps 10-15             Perform Capillary Blood Glucose checks as needed.  Exercise Prescription Changes:   Exercise Prescription Changes     Row Name 11/07/20 1000 11/24/20 0900 12/02/20 1000 12/06/20 1500 12/21/20 1600     Response to Exercise   Blood Pressure (Admit) 122/64 114/60 -- 126/62 142/72   Blood Pressure (Exercise) 130/74 148/56 -- 142/76 132/60   Blood Pressure (Exit) 116/64 110/58 -- 114/54 112/66   Heart Rate (Admit) 68 bpm 65 bpm -- 76 bpm 60 bpm   Heart Rate (Exercise) 103 bpm 107 bpm -- 115 bpm 95 bpm   Heart Rate (Exit) 73 bpm 82 bpm -- 107 bpm 75 bpm   Oxygen Saturation (Admit) 94 % -- -- -- --   Oxygen Saturation (Exercise) 94 % -- -- -- --   Rating of Perceived Exertion (Exercise) 13 13 -- 12 11   Perceived Dyspnea (Exercise) 2 -- -- -- --   Symptoms SOB, ankle aggravated from shoes none -- none none   Comments walk test results -- -- -- --   Duration -- Continue with 30 min of aerobic exercise without signs/symptoms of physical distress. -- Continue with 30 min of aerobic exercise without signs/symptoms of physical distress. Continue with 30 min of  aerobic exercise without signs/symptoms of physical distress.   Intensity -- THRR unchanged -- THRR unchanged THRR unchanged     Progression   Progression -- Continue to progress workloads to maintain intensity without signs/symptoms of physical distress. -- Continue to progress workloads to maintain intensity without signs/symptoms of physical distress. Continue to progress workloads to maintain intensity without  signs/symptoms of physical distress.   Average METs -- 3.25 -- 3.41 3.26     Resistance Training   Training Prescription -- Yes -- Yes Yes   Weight -- 4 lb -- 4 lb 4 lb   Reps -- 10-15 -- 10-15 10-15     Interval Training   Interval Training -- -- -- No No     Treadmill   MPH -- 2.4 -- 2.5 2.5   Grade -- 0.5 -- 1 1   Minutes -- 15 -- 15 15   METs -- 3 -- 3.26 3.26     Recumbant Bike   Level -- -- -- 2 2   Watts -- -- -- 34 27   Minutes -- -- -- 15 15   METs -- -- -- 3.57 3.2     REL-XR   Level -- 1 -- 2 --   Minutes -- 15 -- 15 --   METs -- 3.5 -- -- --     Home Exercise Plan   Plans to continue exercise at -- -- Home (comment)  walking, cross trainer Home (comment)  walking, cross trainer Home (comment)  walking, cross trainer   Frequency -- -- Add 2 additional days to program exercise sessions. Add 2 additional days to program exercise sessions. Add 2 additional days to program exercise sessions.   Initial Home Exercises Provided -- -- 12/02/20 12/02/20 12/02/20    Row Name 01/04/21 1300             Response to Exercise   Blood Pressure (Admit) 126/64       Blood Pressure (Exercise) 164/70       Blood Pressure (Exit) 108/62       Heart Rate (Admit) 67 bpm       Heart Rate (Exercise) 106 bpm       Heart Rate (Exit) 77 bpm       Rating of Perceived Exertion (Exercise) 12       Symptoms none       Duration Continue with 30 min of aerobic exercise without signs/symptoms of physical distress.       Intensity THRR unchanged               Progression     Progression Continue to progress workloads to maintain intensity without signs/symptoms of physical distress.       Average METs 3.6               Resistance Training     Training Prescription Yes       Weight 4 lb       Reps 10-15               Interval Training     Interval Training No               Treadmill     MPH 2.7       Grade 1.5       Minutes 15       METs 3.63               Recumbant  Bike  Level 2       Watts 37       Minutes 15       METs 3.59               REL-XR     Level 3       Minutes 15               Home Exercise Plan     Plans to continue exercise at Home (comment)  walking, cross trainer       Frequency Add 2 additional days to program exercise sessions.       Initial Home Exercises Provided 12/02/20               Exercise Comments:   Exercise Goals and Review:   Exercise Goals     Row Name 11/07/20 1038             Exercise Goals   Increase Physical Activity Yes       Intervention Provide advice, education, support and counseling about physical activity/exercise needs.;Develop an individualized exercise prescription for aerobic and resistive training based on initial evaluation findings, risk stratification, comorbidities and participant's personal goals.       Expected Outcomes Short Term: Attend rehab on a regular basis to increase amount of physical activity.;Long Term: Add in home exercise to make exercise part of routine and to increase amount of physical activity.;Long Term: Exercising regularly at least 3-5 days a week.       Increase Strength and Stamina Yes       Intervention Provide advice, education, support and counseling about physical activity/exercise needs.;Develop an individualized exercise prescription for aerobic and resistive training based on initial evaluation findings, risk stratification, comorbidities and participant's personal goals.       Expected Outcomes Short Term: Increase workloads from initial exercise prescription for resistance, speed, and METs.;Short Term: Perform resistance training exercises routinely during rehab and add in resistance training at home;Long Term: Improve cardiorespiratory fitness, muscular endurance and strength as measured by increased METs and functional capacity (6MWT)       Able to understand and use rate of perceived exertion (RPE) scale Yes       Intervention Provide education  and explanation on how to use RPE scale       Expected Outcomes Short Term: Able to use RPE daily in rehab to express subjective intensity level;Long Term:  Able to use RPE to guide intensity level when exercising independently       Able to understand and use Dyspnea scale Yes       Intervention Provide education and explanation on how to use Dyspnea scale       Expected Outcomes Short Term: Able to use Dyspnea scale daily in rehab to express subjective sense of shortness of breath during exertion;Long Term: Able to use Dyspnea scale to guide intensity level when exercising independently       Knowledge and understanding of Target Heart Rate Range (THRR) Yes       Intervention Provide education and explanation of THRR including how the numbers were predicted and where they are located for reference       Expected Outcomes Long Term: Able to use THRR to govern intensity when exercising independently;Short Term: Able to use daily as guideline for intensity in rehab;Short Term: Able to state/look up THRR       Able to check pulse independently Yes       Intervention Provide education and  demonstration on how to check pulse in carotid and radial arteries.;Review the importance of being able to check your own pulse for safety during independent exercise       Expected Outcomes Short Term: Able to explain why pulse checking is important during independent exercise;Long Term: Able to check pulse independently and accurately       Understanding of Exercise Prescription Yes       Intervention Provide education, explanation, and written materials on patient's individual exercise prescription       Expected Outcomes Short Term: Able to explain program exercise prescription;Long Term: Able to explain home exercise prescription to exercise independently                Exercise Goals Re-Evaluation :  Exercise Goals Re-Evaluation     Row Name 11/24/20 1157 12/02/20 1025 12/06/20 1540 12/21/20 1618 01/04/21  1319     Exercise Goal Re-Evaluation   Exercise Goals Review Increase Physical Activity;Increase Strength and Stamina Increase Physical Activity;Increase Strength and Stamina;Able to understand and use rate of perceived exertion (RPE) scale;Able to understand and use Dyspnea scale;Able to check pulse independently;Knowledge and understanding of Target Heart Rate Range (THRR);Understanding of Exercise Prescription Increase Physical Activity;Increase Strength and Stamina;Understanding of Exercise Prescription Increase Physical Activity;Increase Strength and Stamina Increase Physical Activity;Increase Strength and Stamina;Understanding of Exercise Prescription   Comments James Peck is progressing well and has increased speed and grade on TM.  Staff will monitor progress. Reviewed home exercise with pt today.  Pt plans to walk on track at home and use cross trainer at home for exercise.  We also talked about access to staff videos on YouTube.  Reviewed THR, pulse, RPE, sign and symptoms, pulse oximetery and when to call 911 or MD.  Also discussed weather considerations and indoor options.  Pt voiced understanding. James Peck is doing well in rehab.  He is up to 34 watts on the bike.  We will continue to montior his progress. James Peck attends consistently and reaches low end of THR range.  He is at RPE 11.  Staff wil encourage increasing workloads. Stephanie has been doing well in rehab.  He is currently out due to [place] exposure.  He is nearing graduation and should improve his post 6MWT.  We will continue to monitor his progress.   Expected Outcomes Short:  attend consistently Long: build overall stamina Short: Get back to walking daily again Long: Continue to exercise independently Short: Increase workloads Long: Continue to build stamina. Short: increase workloads and work in [employer] range Long:  increase overall stamina Short: Return to regular attendance Long: Continue to improve stamina            Discharge Exercise  Prescription (Final Exercise Prescription Changes):  Exercise Prescription Changes - 01/04/21 1300       Response to Exercise   Blood Pressure (Admit) 126/64    Blood Pressure (Exercise) 164/70    Blood Pressure (Exit) 108/62    Heart Rate (Admit) 67 bpm    Heart Rate (Exercise) 106 bpm    Heart Rate (Exit) 77 bpm    Rating of Perceived Exertion (Exercise) 12    Symptoms none    Duration Continue with 30 min of aerobic exercise without signs/symptoms of physical distress.    Intensity THRR unchanged      Progression   Progression Continue to progress workloads to maintain intensity without signs/symptoms of physical distress.    Average METs 3.6      Resistance Training   Training  Prescription Yes    Weight 4 lb    Reps 10-15      Interval Training   Interval Training No      Treadmill   MPH 2.7    Grade 1.5    Minutes 15    METs 3.63      Recumbant Bike   Level 2    Watts 37    Minutes 15    METs 3.59      REL-XR   Level 3    Minutes [place] to continue exercise at Home (comment)   walking, cross trainer   Frequency Add 2 additional days to program exercise sessions.    Initial Home Exercises Provided 12/02/20             Nutrition:  Target Goals: Understanding of nutrition guidelines, daily intake of sodium 1500mg , cholesterol 200mg , calories 30% from fat and 7% or less from saturated fats, daily to have 5 or more servings of fruits and vegetables.  Education: All About Nutrition: -Group instruction provided by verbal, written material, interactive activities, discussions, models, and posters to present general guidelines for heart healthy nutrition including fat, fiber, MyPlate, the role of sodium in heart healthy nutrition, utilization of the nutrition label, and utilization of this knowledge for meal planning. Follow up email sent as well. Written material given at graduation. Flowsheet Row Cardiac Rehab from 12/28/2020 in  [facility] Cardiac and Pulmonary Rehab  Education need identified 11/07/20  Date 12/07/20  Educator [place]  Instruction Review Code 1- Verbalizes Understanding       Biometrics:  Pre Biometrics - 11/07/20 1039       Pre Biometrics   Height 5' 6.1" (1.679 m)    Weight 159 lb 6.4 oz (72.3 kg)    BMI (Calculated) 25.65    Single Leg Stand 30 seconds              Nutrition Therapy Plan and Nutrition Goals:  Nutrition Therapy & Goals - 11/23/20 0915       Nutrition Therapy   Diet Heart healhty, low Na    Drug/Food Interactions Statins/Certain Fruits    Protein (specify units) 55g    Fiber 30 grams    Whole Grain Foods 3 servings    Saturated Fats 12 max. grams    Fruits and Vegetables 8 servings/day    Sodium 1.5 grams      Personal Nutrition Goals   Nutrition Goal ST: ensure getting a rainbow of plant foods weekly  LT: continue with current diet    Comments Breakfast : breakfast bar with some fruit and yogurt - coffee (stevia and creamer - decaff) Lunch: most of the time when home sandwich (banana with lite mayo, [place], bologna on whole wheat) - stevia tea (decaff)  with some potato chips Dinner: last night - salad with grilled chicken. They eat a lot of grilled chicken, will limit red meat. He eats flounder most of the time when he eats fish. Sometimes they will have pork chops sometimes. Most of the time mostly vegetables - slaw, peas, salad greens, pintos, sweet potato, tomatoes, turnip greens, sometimes mashed potatoes. Snack: fruit Drinks: diet coke or water or stevia tea. He tries to limit his cheese intake and he doesn't eat much pasta. His wife will be the one to cook - she use oil, but he doesn't know what kind. He reports that she cooks with very little salt. He reports  going out to eat Friday nights (grilled chicken sandwich or salad) and Sunday nights will go out and get fish or grilled chicken. [place] farm. Discussed heart healthy eating. He would not like to  make any further changes at this time.      Intervention Plan   Intervention Prescribe, educate and counsel regarding individualized specific dietary modifications aiming towards targeted core components such as weight, hypertension, lipid management, diabetes, heart failure and other comorbidities.;Nutrition handout(s) given to patient.    Expected Outcomes Short Term Goal: Understand basic principles of dietary content, such as calories, fat, sodium, cholesterol and nutrients.;Short Term Goal: A plan has been developed with personal nutrition goals set during dietitian appointment.;Long Term Goal: Adherence to prescribed nutrition plan.             Nutrition Assessments:  MEDIFICTS Score Key: ?70 Need to make dietary changes  40-70 Heart Healthy Diet ? 40 Therapeutic Level Cholesterol Diet  Flowsheet Row Cardiac Rehab from 11/07/2020 in [facility] Cardiac and Pulmonary Rehab  Picture Your Plate Total Score on Admission 72      Picture Your Plate Scores: <29 Unhealthy dietary pattern with much room for improvement. 41-50 Dietary pattern unlikely to meet recommendations for good health and room for improvement. 51-60 More healthful dietary pattern, with some room for improvement.  >60 Healthy dietary pattern, although there may be some specific behaviors that could be improved.    Nutrition Goals Re-Evaluation:   Nutrition Goals Discharge (Final Nutrition Goals Re-Evaluation):   Psychosocial: Target Goals: Acknowledge presence or absence of significant depression and/or stress, maximize coping skills, provide positive support system. Participant is able to verbalize types and ability to use techniques and skills needed for reducing stress and depression.   Education: Stress, Anxiety, and Depression - Group verbal and visual presentation to define topics covered.  Reviews how body is impacted by stress, anxiety, and depression.  Also discusses healthy ways to reduce stress and to  treat/manage anxiety and depression.  Written material given at graduation.   Education: Sleep Hygiene -Provides group verbal and written instruction about how sleep can affect your health.  Define sleep hygiene, discuss sleep cycles and impact of sleep habits. Review good sleep hygiene tips.    Initial Review & Psychosocial Screening:  Initial Psych Review & Screening - 11/04/20 1012       Initial Review   Current issues with None Identified      Family Dynamics   Good Support System? Yes   wife and 2 kidsd live close by with their families, siblings     Barriers   Psychosocial barriers to participate in program There are no identifiable barriers or psychosocial needs.;The patient should benefit from training in stress management and relaxation.      Screening Interventions   Interventions Encouraged to exercise    Expected Outcomes Short Term goal: Utilizing psychosocial counselor, staff and physician to assist with identification of specific Stressors or current issues interfering with healing process. Setting desired goal for each stressor or current issue identified.;Long Term Goal: Stressors or current issues are controlled or eliminated.;Short Term goal: Identification and review with participant of any Quality of Life or Depression concerns found by scoring the questionnaire.;Long Term goal: The participant improves quality of Life and PHQ9 Scores as seen by post scores and/or verbalization of changes             Quality of Life Scores:   Quality of Life - 11/07/20 1039  Quality of Life   Select Quality of Life      Quality of Life Scores   Health/Function Pre 23.07 %    Socioeconomic Pre 26.57 %    Psych/Spiritual Pre 29.14 %    Family Pre 28.3 %    GLOBAL Pre 25.81 %            Scores of 19 and below usually indicate a poorer quality of life in these areas.  A difference of  2-3 points is a clinically meaningful difference.  A difference of 2-3 points  in the total score of the Quality of Life Index has been associated with significant improvement in overall quality of life, self-image, physical symptoms, and general health in studies assessing change in quality of life.  PHQ-9: Recent Review Flowsheet Data     Depression screen [facility] 2/9 12/07/2020 11/07/2020 07/01/2020 06/30/2019 02/27/2019   Decreased Interest 0 1 0 0 0   Down, Depressed, Hopeless 0 0 0 0 0   PHQ - 2 Score 0 1 0 0 0   Altered sleeping 0 1 - - -   Tired, decreased energy 0 1 - - -   Change in appetite 0 0 - - -   Feeling bad or failure about yourself  0 0 - - -   Trouble concentrating 0 1 - - -   Moving slowly or fidgety/restless 0 2 - - -   Suicidal thoughts 0 0 - - -   PHQ-9 Score 0 6 - - -   Difficult doing work/chores Not difficult at all Not difficult at all - - -      Interpretation of Total Score  Total Score Depression Severity:  1-4 = Minimal depression, 5-9 = Mild depression, 10-14 = Moderate depression, 15-19 = Moderately severe depression, 20-27 = Severe depression   Psychosocial Evaluation and Intervention:  Psychosocial Evaluation - 11/04/20 1024       Psychosocial Evaluation & Interventions   Interventions Encouraged to exercise with the program and follow exercise prescription    Comments James Peck has no barriers to attending the program. He is ready to start. He lives with his wife of 32 years. His 2 sons and their families live 1 and 2 doors down from his home. His entire family is is support system as well as his siblings and their families. He likes to work with old cars, has always raised a garden. His wife sews.  He said that keeps them busy. He states he has no stress, depression nor aniexty at this time. He should do well in the program.    Expected Outcomes STG James Peck attends all scheduled sessions.  He continues to live stress and depression free.  LTG James Peck is able to continue any lifestyle changes learned in the program.    Continue Psychosocial  Services  Follow up required by staff             Psychosocial Re-Evaluation:  Psychosocial Re-Evaluation     James Peck Name 12/02/20 1001 12/07/20 1014           Psychosocial Re-Evaluation   Current issues with None Identified None Identified      Comments Patient reports no issues with their current mental states, sleep, stress, depression or anxiety. Will follow up with patient in a few weeks for any changes. Redid James Peck's PHQ today.  He was all clear. He is feeling better and has more energy again.  Even his wife has noticed the improvement!!  Expected Outcomes Short: Continue to exercise regularly to support mental health and notify staff of any changes. Long: maintain mental health and well being through teaching of rehab or prescribed medications independently. Short: Continue to exercise regularly to support mental health and notify staff of any changes. Long: maintain mental health and well being through teaching of rehab or prescribed medications independently.      Interventions Encouraged to attend Cardiac Rehabilitation for the exercise Encouraged to attend Cardiac Rehabilitation for the exercise      Continue Psychosocial Services  Follow up required by staff --               Psychosocial Discharge (Final Psychosocial Re-Evaluation):  Psychosocial Re-Evaluation - 12/07/20 1014       Psychosocial Re-Evaluation   Current issues with None Identified    Comments Redid James Peck's PHQ today.  He was all clear. He is feeling better and has more energy again.  Even his wife has noticed the improvement!!    Expected Outcomes Short: Continue to exercise regularly to support mental health and notify staff of any changes. Long: maintain mental health and well being through teaching of rehab or prescribed medications independently.    Interventions Encouraged to attend Cardiac Rehabilitation for the exercise             Vocational Rehabilitation: Provide vocational rehab  assistance to qualifying candidates.   Vocational Rehab Evaluation & Intervention:  Vocational Rehab - 11/04/20 1028       Initial Vocational Rehab Evaluation & Intervention   Assessment shows need for Vocational Rehabilitation No             Education: Education Goals: Education classes will be provided on a variety of topics geared toward better understanding of heart health and risk factor modification. Participant will state understanding/return demonstration of topics presented as noted by education test scores.  Learning Barriers/Preferences:   General Cardiac Education Topics:  AED/CPR: - Group verbal and written instruction with the use of models to demonstrate the basic use of the AED with the basic ABC's of resuscitation.   Anatomy and Cardiac Procedures: - Group verbal and visual presentation and models provide information about basic cardiac anatomy and function. Reviews the testing methods done to diagnose heart disease and the outcomes of the test results. Describes the treatment choices: Medical Management, Angioplasty, or Coronary Bypass Surgery for treating various heart conditions including Myocardial Infarction, Angina, Valve Disease, and Cardiac Arrhythmias.  Written material given at graduation. Flowsheet Row Cardiac Rehab from 12/28/2020 in [facility] Cardiac and Pulmonary Rehab  Date 11/23/20  Educator SB  Instruction Review Code 1- Verbalizes Understanding       Medication Safety: - Group verbal and visual instruction to review commonly prescribed medications for heart and lung disease. Reviews the medication, class of the drug, and side effects. Includes the steps to properly store meds and maintain the prescription regimen.  Written material given at graduation. Flowsheet Row Cardiac Rehab from 12/28/2020 in [facility] Cardiac and Pulmonary Rehab  Date 12/14/20  Educator SB  Instruction Review Code 1- Verbalizes Understanding       Intimacy: - Group verbal  instruction through game format to discuss how heart and lung disease can affect sexual intimacy. Written material given at graduation.. Flowsheet Row Cardiac Rehab from 12/28/2020 in [facility] Cardiac and Pulmonary Rehab  Date 11/16/20  Educator AS  Instruction Review Code 1- Verbalizes Understanding       Know Your Numbers and Heart Failure: - Group verbal  and visual instruction to discuss disease risk factors for cardiac and pulmonary disease and treatment options.  Reviews associated critical values for Overweight/Obesity, Hypertension, Cholesterol, and Diabetes.  Discusses basics of heart failure: signs/symptoms and treatments.  Introduces Heart Failure Zone chart for action plan for heart failure.  Written material given at graduation. Flowsheet Row Cardiac Rehab from 12/28/2020 in [facility] Cardiac and Pulmonary Rehab  Date 12/21/20  Educator [facility]  Instruction Review Code 1- Verbalizes Understanding       Infection Prevention: - Provides verbal and written material to individual with discussion of infection control including proper hand washing and proper equipment cleaning during exercise session. Flowsheet Row Cardiac Rehab from 12/28/2020 in [facility] Cardiac and Pulmonary Rehab  Date 11/07/20  Educator [facility]  Instruction Review Code 1- Verbalizes Understanding       Falls Prevention: - Provides verbal and written material to individual with discussion of falls prevention and safety. Flowsheet Row Cardiac Rehab from 12/28/2020 in [facility] Cardiac and Pulmonary Rehab  Date 11/04/20  Educator SB  Instruction Review Code 1- Verbalizes Understanding       Other: -Provides group and verbal instruction on various topics (see comments)   Knowledge Questionnaire Score:  Knowledge Questionnaire Score - 11/07/20 1041       Knowledge Questionnaire Score   Pre Score 25/26 Education Focus: Nutrition             Core Components/Risk Factors/Patient Goals at Admission:  Personal Goals and Risk  Factors at Admission - 11/07/20 1041       Core Components/Risk Factors/Patient Goals on Admission    Weight Management Yes;Weight Loss    Intervention Weight Management: Provide education and appropriate resources to help participant work on and attain dietary goals.;Weight Management: Develop a combined nutrition and exercise program designed to reach desired caloric intake, while maintaining appropriate intake of nutrient and fiber, sodium and fats, and appropriate energy expenditure required for the weight goal.;Weight Management/Obesity: Establish reasonable short term and long term weight goals.    Admit Weight 159 lb 6.4 oz (72.3 kg)    Goal Weight: Short Term 155 lb (70.3 kg)    Goal Weight: Long Term 150 lb (68 kg)    Expected Outcomes Short Term: Continue to assess and modify interventions until short term weight is achieved;Long Term: Adherence to nutrition and physical activity/exercise program aimed toward attainment of established weight goal;Understanding recommendations for meals to include 15-35% energy as protein, 25-35% energy from fat, 35-60% energy from carbohydrates, less than 200mg  of dietary cholesterol, 20-35 gm of total fiber daily;Weight Loss: Understanding of general recommendations for a balanced deficit meal plan, which promotes 1-2 lb weight loss per week and includes a negative energy balance of [phone] kcal/d    Hypertension Yes    Intervention Provide education on lifestyle modifcations including regular physical activity/exercise, weight management, moderate sodium restriction and increased consumption of fresh fruit, vegetables, and low fat dairy, alcohol moderation, and smoking cessation.;Monitor prescription use compliance.    Expected Outcomes Short Term: Continued assessment and intervention until BP is < 140/71mm HG in hypertensive participants. < 130/60mm HG in hypertensive participants with diabetes, heart failure or chronic kidney disease.;Long Term:  Maintenance of blood pressure at goal levels.    Lipids Yes    Intervention Provide education and support for participant on nutrition & aerobic/resistive exercise along with prescribed medications to achieve LDL 70mg , HDL >40mg .    Expected Outcomes Short Term: Participant states understanding of desired cholesterol values and is compliant with  medications prescribed. Participant is following exercise prescription and nutrition guidelines.;Long Term: Cholesterol controlled with medications as prescribed, with individualized exercise RX and with personalized nutrition plan. Value goals: LDL < 70mg , HDL > 40 mg.             Education:Diabetes - Individual verbal and written instruction to review signs/symptoms of diabetes, desired ranges of glucose level fasting, after meals and with exercise. Acknowledge that pre and post exercise glucose checks will be done for 3 sessions at entry of program.   Core Components/Risk Factors/Patient Goals Review:   Goals and Risk Factor Review     Row Name 12/02/20 0957             Core Components/Risk Factors/Patient Goals Review   Personal Goals Review Weight Management/Obesity;Hypertension;Lipids       Review Diar checks his blood pressure at home. He is monitoring it daily. He has lost 2 pounds since the start if the program. He wants to get down 5 more pounds.       Expected Outcomes Short: Lose 5 more pounds. Long: maintain weight independently                Core Components/Risk Factors/Patient Goals at Discharge (Final Review):   Goals and Risk Factor Review - 12/02/20 0957       Core Components/Risk Factors/Patient Goals Review   Personal Goals Review Weight Management/Obesity;Hypertension;Lipids    Review James Peck checks his blood pressure at home. He is monitoring it daily. He has lost 2 pounds since the start if the program. He wants to get down 5 more pounds.    Expected Outcomes Short: Lose 5 more pounds. Long: maintain weight  independently             ITP Comments:  ITP Comments     Row Name 11/04/20 1029 11/07/20 1034 11/16/20 0804 12/14/20 1018 01/04/21 1318   ITP Comments Virtual orientation call completed today. he has an appointment on Date: 11/07/2020 for EP eval and gym Orientation.  Documentation of diagnosis can be found in [facility] Date: 09/26/2020. Completed 6MWT and gym orientation. Initial ITP created and sent for review to Dr. Emily Filbert, Medical Director. 30 Day review completed. Medical Director ITP review done, changes made as directed, and signed approval by Medical Director.  New to program 30 Day review completed. Medical Director ITP review done, changes made as directed, and signed approval by Medical Director. Abb is currently out for COVID exposure.    [place] Name 01/09/21 1633 01/11/21 0935         ITP Comments Unable to get goals for James Peck this review as he is out due to [place]. 30 Day review completed. Medical Director ITP review done, changes made as directed, and signed approval by Medical Director.               Comments:

## 2021-01-13 ENCOUNTER — Other Ambulatory Visit: Payer: Self-pay

## 2021-01-13 ENCOUNTER — Encounter: Payer: PPO | Admitting: *Deleted

## 2021-01-13 DIAGNOSIS — I214 Non-ST elevation (NSTEMI) myocardial infarction: Secondary | ICD-10-CM

## 2021-01-13 NOTE — Progress Notes (Signed)
Daily Session Note  Patient Details  Name: James Peck MRN: [id number] Date of Birth: 1941-03-11 Referring Provider:   Flowsheet Row Cardiac Rehab from 11/07/2020 in [facility] Cardiac and Pulmonary Rehab  Referring Provider Bartholome Bill MD       Encounter Date: 01/13/2021  Check In:  Session Check In - 01/13/21 0938       Check-In   Supervising physician immediately available to respond to emergencies See telemetry face sheet for immediately available ER MD    Location [facility]-Cardiac & Pulmonary Rehab    Staff Present Renita Papa, RN BSN;Joseph Hood RCP,RRT,BSRT;Melissa [place] RDN, LDN    Virtual Visit No    Medication changes reported     No    Fall or balance concerns reported    No    Warm-up and Cool-down Performed on first and last piece of equipment    Resistance Training Performed Yes    VAD Patient? No    PAD/SET Patient? No      Pain Assessment   Currently in Pain? No/denies                Social History   Tobacco Use  Smoking Status Former   Packs/day: 1.00   Years: 40.00   Pack years: 40.00   Types: Cigarettes   Quit date: 2000   Years since quitting: 22.4  Smokeless Tobacco Never    Goals Met:  Independence with exercise equipment Exercise tolerated well No report of cardiac concerns or symptoms Strength training completed today  Goals Unmet:  Not Applicable  Comments: Pt able to follow exercise prescription today without complaint.  Will continue to monitor for progression.    Dr. Emily Filbert is Medical Director for [place].  Dr. Ottie Glazier is Medical Director for [facility] Pulmonary Rehabilitation.

## 2021-01-16 ENCOUNTER — Other Ambulatory Visit: Payer: Self-pay

## 2021-01-16 ENCOUNTER — Encounter: Payer: PPO | Admitting: *Deleted

## 2021-01-16 DIAGNOSIS — I214 Non-ST elevation (NSTEMI) myocardial infarction: Secondary | ICD-10-CM | POA: Diagnosis not present

## 2021-01-16 NOTE — Progress Notes (Signed)
Daily Session Note  Patient Details  Name: James Peck MRN: [id number] Date of Birth: 1941-05-25 Referring Provider:   Flowsheet Row Cardiac Rehab from 11/07/2020 in [facility] Cardiac and Pulmonary Rehab  Referring Provider Bartholome Bill MD       Encounter Date: 01/16/2021  Check In:  Session Check In - 01/16/21 1031       Check-In   Supervising physician immediately available to respond to emergencies See telemetry face sheet for immediately available ER MD    Location [facility]-Cardiac & Pulmonary Rehab    Staff Present Renita Papa, RN BSN;Joseph [address] [place], [place], ACSM CEP, Exercise Physiologist    Virtual Visit No    Medication changes reported     No    Fall or balance concerns reported    No    Warm-up and Cool-down Performed on first and last piece of equipment    Resistance Training Performed Yes    VAD Patient? No    PAD/SET Patient? No      Pain Assessment   Currently in Pain? No/denies                Social History   Tobacco Use  Smoking Status Former   Packs/day: 1.00   Years: 40.00   Pack years: 40.00   Types: Cigarettes   Quit date: 2000   Years since quitting: 22.4  Smokeless Tobacco Never    Goals Met:  Independence with exercise equipment Exercise tolerated well No report of cardiac concerns or symptoms Strength training completed today  Goals Unmet:  Not Applicable  Comments: Pt able to follow exercise prescription today without complaint.  Will continue to monitor for progression.    Dr. Emily Filbert is Medical Director for [place].  Dr. Ottie Glazier is Medical Director for [facility] Pulmonary Rehabilitation.

## 2021-01-18 ENCOUNTER — Other Ambulatory Visit: Payer: Self-pay

## 2021-01-18 DIAGNOSIS — I214 Non-ST elevation (NSTEMI) myocardial infarction: Secondary | ICD-10-CM

## 2021-01-18 NOTE — Progress Notes (Signed)
Daily Session Note  Patient Details  Name: James Peck MRN: [id number] Date of Birth: 14-Feb-1941 Referring Provider:   Flowsheet Row Cardiac Rehab from 11/07/2020 in [facility] Cardiac and Pulmonary Rehab  Referring Provider Bartholome Bill MD       Encounter Date: 01/18/2021  Check In:  Session Check In - 01/18/21 0949       Check-In   Supervising physician immediately available to respond to emergencies See telemetry face sheet for immediately available ER MD    Location [facility]-Cardiac & Pulmonary Rehab    Staff Present Birdie Sons, MPA, RN;Amanda Oletta Darter, BA, ACSM CEP, Exercise Physiologist;Joseph Tessie Fass RCP,RRT,BSRT    Virtual Visit No    Medication changes reported     No    Fall or balance concerns reported    No    Tobacco Cessation No Change    Warm-up and Cool-down Performed on first and last piece of equipment    Resistance Training Performed Yes    VAD Patient? No    PAD/SET Patient? No      Pain Assessment   Currently in Pain? No/denies                Social History   Tobacco Use  Smoking Status Former   Packs/day: 1.00   Years: 40.00   Pack years: 40.00   Types: Cigarettes   Quit date: 2000   Years since quitting: 22.4  Smokeless Tobacco Never    Goals Met:  Independence with exercise equipment Exercise tolerated well No report of cardiac concerns or symptoms Strength training completed today  Goals Unmet:  Not Applicable  Comments: Pt able to follow exercise prescription today without complaint.  Will continue to monitor for progression.    Dr. Emily Filbert is Medical Director for [place].  Dr. Ottie Glazier is Medical Director for [facility] Pulmonary Rehabilitation.

## 2021-01-20 ENCOUNTER — Other Ambulatory Visit: Payer: Self-pay

## 2021-01-20 ENCOUNTER — Encounter: Payer: PPO | Admitting: *Deleted

## 2021-01-20 DIAGNOSIS — I214 Non-ST elevation (NSTEMI) myocardial infarction: Secondary | ICD-10-CM | POA: Diagnosis not present

## 2021-01-20 NOTE — Progress Notes (Signed)
Daily Session Note  Patient Details  Name: James Peck MRN: [id number] Date of Birth: 06/08/1941 Referring Provider:   Flowsheet Row Cardiac Rehab from 11/07/2020 in [facility] Cardiac and Pulmonary Rehab  Referring Provider Fath, Kenneth MD       Encounter Date: 01/20/2021  Check In:  Session Check In - 01/20/21 0944       Check-In   Supervising physician immediately available to respond to emergencies See telemetry face sheet for immediately available ER MD    Location [facility]-Cardiac & Pulmonary Rehab    Staff Present Meredith Craven, RN BSN;Joseph Hood RCP,RRT,BSRT;Jessica Hawkins, MA, RCEP, CCRP, CCET    Virtual Visit No    Medication changes reported     No    Fall or balance concerns reported    No    Warm-up and Cool-down Performed on first and last piece of equipment    Resistance Training Performed Yes    VAD Patient? No    PAD/SET Patient? No      Pain Assessment   Currently in Pain? No/denies                Social History   Tobacco Use  Smoking Status Former   Packs/day: 1.00   Years: 40.00   Pack years: 40.00   Types: Cigarettes   Quit date: 2000   Years since quitting: 22.4  Smokeless Tobacco Never    Goals Met:  Independence with exercise equipment Exercise tolerated well No report of cardiac concerns or symptoms Strength training completed today  Goals Unmet:  Not Applicable  Comments: Pt able to follow exercise prescription today without complaint.  Will continue to monitor for progression.    Dr. Mark Miller is Medical Director for [facility]iac [facility].  Dr. Fuad Aleskerov is Medical Director for [facility] Pulmonary Rehabilitation. 

## 2021-01-24 ENCOUNTER — Other Ambulatory Visit: Payer: Self-pay

## 2021-01-24 ENCOUNTER — Telehealth: Payer: Self-pay | Admitting: Internal Medicine

## 2021-01-24 DIAGNOSIS — I214 Non-ST elevation (NSTEMI) myocardial infarction: Secondary | ICD-10-CM

## 2021-01-24 NOTE — Progress Notes (Signed)
Daily Session Note  Patient Details  Name: James Peck MRN: [id number] Date of Birth: 1940/09/26 Referring Provider:   Flowsheet Row Cardiac Rehab from 11/07/2020 in [facility] Cardiac and Pulmonary Rehab  Referring Provider Bartholome Bill MD       Encounter Date: 01/24/2021  Check In:  Session Check In - 01/24/21 0949       Check-In   Supervising physician immediately available to respond to emergencies See telemetry face sheet for immediately available ER MD    Location [facility]-Cardiac & Pulmonary Rehab    Staff Present Birdie Sons, MPA, RN;Melissa Caiola RDN, Rowe Pavy, BA, ACSM CEP, Exercise Physiologist;Kara Eliezer Bottom, MS, ASCM CEP, Exercise Physiologist    Virtual Visit No    Medication changes reported     No    Fall or balance concerns reported    No    Tobacco Cessation No Change    Warm-up and Cool-down Performed on first and last piece of equipment    Resistance Training Performed Yes    VAD Patient? No    PAD/SET Patient? No      Pain Assessment   Currently in Pain? No/denies                Social History   Tobacco Use  Smoking Status Former   Packs/day: 1.00   Years: 40.00   Pack years: 40.00   Types: Cigarettes   Quit date: 2000   Years since quitting: 22.5  Smokeless Tobacco Never    Goals Met:  Independence with exercise equipment Exercise tolerated well No report of cardiac concerns or symptoms Strength training completed today  Goals Unmet:  Not Applicable  Comments: Pt able to follow exercise prescription today without complaint.  Will continue to monitor for progression.    Dr. Emily Filbert is Medical Director for [place].  Dr. Ottie Glazier is Medical Director for [facility] Pulmonary Rehabilitation.

## 2021-01-25 ENCOUNTER — Other Ambulatory Visit: Payer: Self-pay

## 2021-01-25 DIAGNOSIS — J439 Emphysema, unspecified: Secondary | ICD-10-CM | POA: Diagnosis not present

## 2021-01-25 DIAGNOSIS — I2510 Atherosclerotic heart disease of native coronary artery without angina pectoris: Secondary | ICD-10-CM | POA: Diagnosis not present

## 2021-01-25 DIAGNOSIS — E782 Mixed hyperlipidemia: Secondary | ICD-10-CM | POA: Diagnosis not present

## 2021-01-25 DIAGNOSIS — I25119 Atherosclerotic heart disease of native coronary artery with unspecified angina pectoris: Secondary | ICD-10-CM | POA: Diagnosis not present

## 2021-01-25 DIAGNOSIS — R0989 Other specified symptoms and signs involving the circulatory and respiratory systems: Secondary | ICD-10-CM | POA: Diagnosis not present

## 2021-01-25 DIAGNOSIS — I10 Essential (primary) hypertension: Secondary | ICD-10-CM | POA: Diagnosis not present

## 2021-01-25 MED ORDER — FAMOTIDINE 20 MG PO TABS: 20.0000 mg | ORAL_TABLET | Freq: Every day | ORAL | 1 refills | Status: DC

## 2021-01-25 NOTE — Telephone Encounter (Signed)
Patient called and would like to have his Omeprazole 20 MG TAKE 1 CAPSULE BY MOUTH Refilled, too.

## 2021-01-25 NOTE — Telephone Encounter (Signed)
See my message below before refilling.

## 2021-01-25 NOTE — Telephone Encounter (Signed)
See me before calling pt.  Please call pt regarding request for refill of prilosec.  Confirm no acid reflux symptoms - now or recent.  If controlled and pt agreeable, I would like to change his prilosec to a different medication to control acid reflux.  When has he last had any problems with reflux?

## 2021-01-25 NOTE — Telephone Encounter (Signed)
He is taking this regularly. No issues with reflux for >6 months. He is agreeable to switch medications. Which one would you like sent in?

## 2021-01-25 NOTE — Telephone Encounter (Signed)
Patient aware of below and pepcid has been sent in per patient request

## 2021-01-25 NOTE — Telephone Encounter (Signed)
If he has been taking prilosec 20mg  q day, have him stop this and try pepcid 20mg  q day.  If has any problems with acid reflux or other symptoms, let us know.

## 2021-01-27 ENCOUNTER — Other Ambulatory Visit: Payer: Self-pay

## 2021-01-27 ENCOUNTER — Encounter: Payer: PPO | Attending: Cardiology | Admitting: *Deleted

## 2021-01-27 DIAGNOSIS — I252 Old myocardial infarction: Secondary | ICD-10-CM | POA: Diagnosis not present

## 2021-01-27 DIAGNOSIS — I2510 Atherosclerotic heart disease of native coronary artery without angina pectoris: Secondary | ICD-10-CM | POA: Insufficient documentation

## 2021-01-27 DIAGNOSIS — Z955 Presence of coronary angioplasty implant and graft: Secondary | ICD-10-CM | POA: Diagnosis not present

## 2021-01-27 DIAGNOSIS — I214 Non-ST elevation (NSTEMI) myocardial infarction: Secondary | ICD-10-CM | POA: Diagnosis not present

## 2021-01-27 DIAGNOSIS — Z48812 Encounter for surgical aftercare following surgery on the circulatory system: Secondary | ICD-10-CM | POA: Diagnosis not present

## 2021-01-27 NOTE — Progress Notes (Signed)
Daily Session Note  Patient Details  Name: James Peck MRN: [id number] Date of Birth: 1940-12-24 Referring Provider:   Flowsheet Row Cardiac Rehab from 11/07/2020 in [facility] Cardiac and Pulmonary Rehab  Referring Provider Bartholome Bill MD       Encounter Date: 01/27/2021  Check In:  Session Check In - 01/27/21 0942       Check-In   Supervising physician immediately available to respond to emergencies See telemetry face sheet for immediately available ER MD    Location [facility]-Cardiac & Pulmonary Rehab    Staff Present Renita Papa, RN BSN;Jessica Luan Pulling, MA, RCEP, CCRP, CCET;Joseph [place], [place]    Virtual Visit No    Medication changes reported     No    Fall or balance concerns reported    No    Warm-up and Cool-down Performed on first and last piece of equipment    Resistance Training Performed Yes    VAD Patient? No    PAD/SET Patient? No      Pain Assessment   Currently in Pain? No/denies                Social History   Tobacco Use  Smoking Status Former   Packs/day: 1.00   Years: 40.00   Pack years: 40.00   Types: Cigarettes   Quit date: 2000   Years since quitting: 22.5  Smokeless Tobacco Never    Goals Met:  Independence with exercise equipment Exercise tolerated well No report of cardiac concerns or symptoms Strength training completed today  Goals Unmet:  Not Applicable  Comments: Pt able to follow exercise prescription today without complaint.  Will continue to monitor for progression.    Dr. Emily Filbert is Medical Director for [place].  Dr. Ottie Glazier is Medical Director for [facility] Pulmonary Rehabilitation.

## 2021-01-31 ENCOUNTER — Other Ambulatory Visit: Payer: Self-pay | Admitting: Internal Medicine

## 2021-01-31 NOTE — Patient Instructions (Signed)
Discharge Patient Instructions  Patient Details  Name: James Peck MRN: [id number] Date of Birth: 09-12-1940 Referring Provider:  Teodoro Spray, MD   Number of Visits: 59  Reason for Discharge:  Patient reached a stable level of exercise. Patient independent in their exercise. Patient has met program and personal goals.  Smoking History:  Social History   Tobacco Use  Smoking Status Former   Packs/day: 1.00   Years: 40.00   Pack years: 40.00   Types: Cigarettes   Quit date: 2000   Years since quitting: 22.5  Smokeless Tobacco Never    Diagnosis:  NSTEMI (non-ST elevation myocardial infarction) ([place])  Initial Exercise Prescription:  Initial Exercise Prescription - 11/07/20 1000       Date of Initial Exercise RX and Referring Provider   Date 11/07/20    Referring Provider Bartholome Bill MD      Treadmill   MPH 2.3    Grade 0    Minutes 15    METs 2.76      Recumbant Bike   Level 2    RPM 50    Watts 10    Minutes 15    METs 2.5      NuStep   Level 2    SPM 80    Minutes 15    METs 2.5      REL-XR   Level 1    Speed 50    Minutes 15    METs 2.5      Prescription Details   Frequency (times per week) 3    Duration Progress to 30 minutes of continuous aerobic without signs/symptoms of physical distress      Intensity   THRR 40-80% of Max Heartrate 97-126    Ratings of Perceived Exertion 11-13    Perceived Dyspnea 0-4      Progression   Progression Continue to progress workloads to maintain intensity without signs/symptoms of physical distress.      Resistance Training   Training Prescription Yes    Weight 4 lb    Reps 10-15             Discharge Exercise Prescription (Final Exercise Prescription Changes):  Exercise Prescription Changes - 01/31/21 1600       Response to Exercise   Blood Pressure (Admit) 126/64    Blood Pressure (Exit) 136/56    Heart Rate (Admit) 63 bpm    Heart Rate (Exercise) 105 bpm    Heart Rate (Exit) 78  bpm    Rating of Perceived Exertion (Exercise) 12    Symptoms none    Duration Continue with 30 min of aerobic exercise without signs/symptoms of physical distress.    Intensity THRR unchanged      Progression   Progression Continue to progress workloads to maintain intensity without signs/symptoms of physical distress.    Average METs 3.8      Resistance Training   Training Prescription Yes    Weight 4 lb    Reps 10-15      Interval Training   Interval Training No      Treadmill   MPH 2.7    Grade 1.5    Minutes 15    METs 3.63      NuStep   Level 4    Minutes 15    METs 3.1      Elliptical   Level 1    Speed 2.5    Minutes 15    METs 2.5  REL-XR   Level 3    Minutes 15      Biostep-RELP   Level 3    Minutes 15    METs 3      Home Exercise Plan   Plans to continue exercise at Home (comment)   walking, cross trainer   Frequency Add 2 additional days to program exercise sessions.    Initial Home Exercises Provided 12/02/20             Functional Capacity:  6 Minute Walk     Row Name 11/07/20 1034 01/16/21 1038       6 Minute Walk   Phase Initial Discharge    Distance 1215 feet 1540 feet    Distance % Change -- 26.7 %    Distance Feet Change -- 325 ft    Walk Time 6 minutes 6 minutes    # of Rest Breaks 0 0    MPH 2.3 2.9    METS 2.37 3.14    RPE 13 13    Perceived Dyspnea  2 --    VO2 Peak 8.29 11    Symptoms Yes (comment) No    Comments SOB, "wore wrong shoes today", shoes bothered his ankle --    Resting HR 68 bpm 63 bpm    Resting BP 122/64 138/62    Resting Oxygen Saturation  94 % 98 %    Exercise Oxygen Saturation  during 6 min walk 94 % 91 %    Max Ex. HR 103 bpm 92 bpm    Max Ex. BP 130/74 178/82    2 Minute Post BP 116/64 140/70            Nutrition:  Nutrition Therapy & Goals - 11/23/20 0915       Nutrition Therapy   Diet Heart healhty, low Na    Drug/Food Interactions Statins/Certain Fruits    Protein (specify  units) 55g    Fiber 30 grams    Whole Grain Foods 3 servings    Saturated Fats 12 max. grams    Fruits and Vegetables 8 servings/day    Sodium 1.5 grams      Personal Nutrition Goals   Nutrition Goal ST: ensure getting a rainbow of plant foods weekly  LT: continue with current diet    Comments Breakfast : breakfast bar with some fruit and yogurt - coffee (stevia and creamer - decaff) Lunch: most of the time when home sandwich (banana with lite mayo, [place], bologna on whole wheat) - stevia tea (decaff)  with some potato chips Dinner: last night - salad with grilled chicken. They eat a lot of grilled chicken, will limit red meat. He eats flounder most of the time when he eats fish. Sometimes they will have pork chops sometimes. Most of the time mostly vegetables - slaw, peas, salad greens, pintos, sweet potato, tomatoes, turnip greens, sometimes mashed potatoes. Snack: fruit Drinks: diet coke or water or stevia tea. He tries to limit his cheese intake and he doesn't eat much pasta. His wife will be the one to cook - she use oil, but he doesn't know what kind. He reports that she cooks with very little salt. He reports going out to eat Friday nights (grilled chicken sandwich or salad) and Sunday nights will go out and get fish or grilled chicken. [place]. Discussed heart healthy eating. He would not like to make any further changes at this time.      Intervention Plan  Intervention Prescribe, educate and counsel regarding individualized specific dietary modifications aiming towards targeted core components such as weight, hypertension, lipid management, diabetes, heart failure and other comorbidities.;Nutrition handout(s) given to patient.    Expected Outcomes Short Term Goal: Understand basic principles of dietary content, such as calories, fat, sodium, cholesterol and nutrients.;Short Term Goal: A plan has been developed with personal nutrition goals set during dietitian  appointment.;Long Term Goal: Adherence to prescribed nutrition plan.            Goals reviewed with patient; copy given to patient.

## 2021-02-01 ENCOUNTER — Other Ambulatory Visit: Payer: Self-pay

## 2021-02-01 ENCOUNTER — Other Ambulatory Visit (INDEPENDENT_AMBULATORY_CARE_PROVIDER_SITE_OTHER): Payer: Self-pay | Admitting: Vascular Surgery

## 2021-02-01 DIAGNOSIS — I214 Non-ST elevation (NSTEMI) myocardial infarction: Secondary | ICD-10-CM

## 2021-02-01 DIAGNOSIS — I6523 Occlusion and stenosis of bilateral carotid arteries: Secondary | ICD-10-CM

## 2021-02-01 DIAGNOSIS — I739 Peripheral vascular disease, unspecified: Secondary | ICD-10-CM

## 2021-02-01 NOTE — Progress Notes (Signed)
Daily Session Note  Patient Details  Name: James Peck MRN: [id number] Date of Birth: [birth year] Referring Provider:   Flowsheet Row Cardiac Rehab from 11/07/2020 in [facility] Cardiac and Pulmonary Rehab  Referring Provider Bartholome Bill MD       Encounter Date: 02/01/2021  Check In:  Session Check In - 02/01/21 0942       Check-In   Supervising physician immediately available to respond to emergencies See telemetry face sheet for immediately available ER MD    Location [facility]-Cardiac & Pulmonary Rehab    Staff Present Birdie Sons, MPA, RN;Laureen Owens Shark, BS, RRT, CPFT;Joseph [place], [place]    Virtual Visit No    Medication changes reported     No    Fall or balance concerns reported    No    Tobacco Cessation No Change    Warm-up and Cool-down Performed on first and last piece of equipment    Resistance Training Performed Yes    VAD Patient? No    PAD/SET Patient? No      Pain Assessment   Currently in Pain? No/denies                Social History   Tobacco Use  Smoking Status Former   Packs/day: 1.00   Years: 40.00   Pack years: 40.00   Types: Cigarettes   Quit date: 2000   Years since quitting: 22.5  Smokeless Tobacco Never    Goals Met:  Independence with exercise equipment Exercise tolerated well No report of cardiac concerns or symptoms Strength training completed today  Goals Unmet:  Not Applicable  Comments: Pt able to follow exercise prescription today without complaint.  Will continue to monitor for progression.    Dr. Emily Filbert is Medical Director for [place].  Dr. Ottie Glazier is Medical Director for [facility] Pulmonary Rehabilitation.

## 2021-02-02 ENCOUNTER — Ambulatory Visit (INDEPENDENT_AMBULATORY_CARE_PROVIDER_SITE_OTHER): Payer: PPO | Admitting: Vascular Surgery

## 2021-02-02 ENCOUNTER — Ambulatory Visit (INDEPENDENT_AMBULATORY_CARE_PROVIDER_SITE_OTHER): Payer: PPO

## 2021-02-02 ENCOUNTER — Other Ambulatory Visit: Payer: Self-pay

## 2021-02-02 VITALS — BP 127/71 | HR 52 | Resp 14 | Ht 66.0 in | Wt 157.0 lb

## 2021-02-02 DIAGNOSIS — E785 Hyperlipidemia, unspecified: Secondary | ICD-10-CM | POA: Diagnosis not present

## 2021-02-02 DIAGNOSIS — I6523 Occlusion and stenosis of bilateral carotid arteries: Secondary | ICD-10-CM

## 2021-02-02 DIAGNOSIS — I739 Peripheral vascular disease, unspecified: Secondary | ICD-10-CM | POA: Diagnosis not present

## 2021-02-02 DIAGNOSIS — I10 Essential (primary) hypertension: Secondary | ICD-10-CM | POA: Diagnosis not present

## 2021-02-02 DIAGNOSIS — I771 Stricture of artery: Secondary | ICD-10-CM | POA: Diagnosis not present

## 2021-02-02 DIAGNOSIS — I2510 Atherosclerotic heart disease of native coronary artery without angina pectoris: Secondary | ICD-10-CM

## 2021-02-02 NOTE — Progress Notes (Signed)
MRN : [id number]  James Peck is a 80 y.o. (August 07, 1940) male who presents with chief complaint of  Chief Complaint  Patient presents with   Follow-up    ultrasound  .  History of Present Illness:   The patient is seen for evaluation of subclavian stenosis. The left subclavian stenosis was identified after a CT scan showed extensive calcifications of the left subclavian.  He denies left arm pain or claudication. No dizziness while looking up.  He notes at cardiac rehab his BP in the left is always lower.  The patient denies amaurosis fugax. There is no recent history of TIA symptoms or focal motor deficits. There is no prior documented CVA.  There is no history of migraine headaches. There is no history of seizures.  The patient is taking enteric-coated aspirin 81 mg daily.  The patient has a history of coronary artery disease, no recent episodes of angina or shortness of breath. The patient denies PAD or claudication symptoms. There is a history of hyperlipidemia which is being treated with a statin.    Duplex ultrasound of the carotids shows <40% bilateral internal carotid artery stenosis.  Right and Left vertebral is antegrade   Current Meds  Medication Sig   albuterol (VENTOLIN HFA) 108 (90 Base) MCG/ACT inhaler Inhale 1-2 puffs into the lungs every 6 (six) hours as needed for wheezing or shortness of breath.   ANORO ELLIPTA 62.5-25 MCG/INH AEPB Inhale 1 puff into the lungs daily.   aspirin EC 81 MG tablet Take 81 mg by mouth daily.    calcium carbonate (OS-CAL - DOSED IN MG OF ELEMENTAL CALCIUM) 1250 (500 CA) MG tablet Take 1 tablet by mouth daily.    cetirizine (ZYRTEC) 10 MG tablet TAKE 1 TABLET BY MOUTH DAILY   chlorpheniramine (CHLOR-TRIMETON) 4 MG tablet Take 4 mg by mouth 2 (two) times daily as needed for allergies.   CINNAMON PO Take 1,200 mg by mouth 2 times daily at 12 noon and 4 pm.   clopidogrel (PLAVIX) 75 MG tablet Take 1 tablet (75 mg total) by mouth daily.    dextromethorphan-guaiFENesin (MUCINEX DM) 30-600 MG 12hr tablet Take 1 tablet by mouth 2 (two) times daily.   diclofenac sodium (VOLTAREN) 1 % GEL Apply 2 g 2 (two) times daily topically. To affected joint.   famotidine (PEPCID) 20 MG tablet Take 1 tablet (20 mg total) by mouth daily.   fluticasone (FLONASE) 50 MCG/ACT nasal spray TAKE 2 SPRAYS INTO BOTH NOSTRILS DAILY   GLUCOSAMINE-CHONDROITIN PO Take 1 capsule by mouth daily.   Misc Natural Products (BLACK CHERRY CONCENTRATE PO) Take 1,000 mg daily by mouth.   nitroGLYCERIN (NITROSTAT) 0.4 MG SL tablet Place 1 tablet (0.4 mg total) under the tongue every 5 (five) minutes as needed for chest pain.   Omega-3 Fatty Acids (FISH OIL) 1000 MG CAPS Take 1,000 mg by mouth daily.    rosuvastatin (CRESTOR) 20 MG tablet Take 1 tablet (20 mg total) by mouth daily.   sildenafil (REVATIO) 20 MG tablet TAKE 1 TO 2 TABLETS BY MOUTH EVERY DAY AS NEEDED   telmisartan (MICARDIS) 20 MG tablet TAKE ONE TABLET (20 MG) BY MOUTH EVERY DAY    Past Medical History:  Diagnosis Date   Allergy    Arthritis    Coronary artery disease    GERD (gastroesophageal reflux disease)    History of hiatal hernia    Hypercholesteremia    Hypertension     Past Surgical History:  Procedure  Laterality Date   CARDIAC CATHETERIZATION     cardiac stents     CATARACT EXTRACTION W/PHACO Right 09/10/2017   Procedure: CATARACT EXTRACTION PHACO AND INTRAOCULAR LENS PLACEMENT (IOC);  Surgeon: Birder Robson, MD;  Location: [facility] ORS;  Service: Ophthalmology;  Laterality: Right;  [place] 01:01.7 AP% 15.3 CDE 9.38 Fluid pack lot # [id number]   CATARACT EXTRACTION W/PHACO Left 10/02/2017   Procedure: CATARACT EXTRACTION PHACO AND INTRAOCULAR LENS PLACEMENT (IOC);  Surgeon: Birder Robson, MD;  Location: [facility] ORS;  Service: Ophthalmology;  Laterality: Left;  [place] 00:42.3 AP% 16.2 CDE 6.85 Fluid Pack Lot # [id number]   COLONOSCOPY WITH PROPOFOL N/A 06/28/2017   Procedure: COLONOSCOPY WITH  PROPOFOL;  Surgeon: Manya Silvas, MD;  Location: [facility] ENDOSCOPY;  Service: Endoscopy;  Laterality: N/A;   CORONARY ANGIOPLASTY     STENTS X 2   KNEE ARTHROSCOPY     LEFT HEART CATH AND CORONARY ANGIOGRAPHY N/A 09/12/2020   Procedure: LEFT HEART CATH AND CORONARY ANGIOGRAPHY;  Surgeon: Isaias Cowman, MD;  Location: [place] CV LAB;  Service: Cardiovascular;  Laterality: N/A;    Social History Social History   Tobacco Use   Smoking status: Former    Packs/day: 1.00    Years: 40.00    Pack years: 40.00    Types: Cigarettes    Quit date: 2000    Years since quitting: 22.5   Smokeless tobacco: Never  Vaping Use   Vaping Use: Never used  Substance Use Topics   Alcohol use: No   Drug use: No    Family History Family History  Problem Relation Age of Onset   Heart disease Mother    Diabetes Mother    Heart disease Father    Diabetes Father    Alzheimer's disease Brother    Stroke Brother   No family history of bleeding/clotting disorders, porphyria or autoimmune disease   Allergies  Allergen Reactions   Ace Inhibitors     Cough   Enalapril Cough     REVIEW OF SYSTEMS (Negative unless checked)  Constitutional: [] Weight loss  [] Fever  [] Chills Cardiac: [] Chest pain   [] Chest pressure   [] Palpitations   [] Shortness of breath when laying flat   [] Shortness of breath with exertion. Vascular:  [] Pain in legs with walking   [] Pain in legs at rest  [] History of DVT   [] Phlebitis   [] Swelling in legs   [] Varicose veins   [] Non-healing ulcers Pulmonary:   [] Uses home oxygen   [] Productive cough   [] Hemoptysis   [] Wheeze  [] COPD   [] Asthma Neurologic:  [] Dizziness   [] Seizures   [] History of stroke   [] History of TIA  [] Aphasia   [] Vissual changes   [] Weakness or numbness in arm   [] Weakness or numbness in leg Musculoskeletal:   [] Joint swelling   [] Joint pain   [] Low back pain Hematologic:  [] Easy bruising  [] Easy bleeding   [] Hypercoagulable state    [] Anemic Gastrointestinal:  [] Diarrhea   [] Vomiting  [] Gastroesophageal reflux/heartburn   [] Difficulty swallowing. Genitourinary:  [] Chronic kidney disease   [] Difficult urination  [] Frequent urination   [] Blood in urine Skin:  [] Rashes   [] Ulcers  Psychological:  [] History of anxiety   []  History of major depression.  Physical Examination  Vitals:   02/02/21 0949 02/02/21 0950  BP: 121/67 127/71  Pulse: (!) 52   Resp: 14   Weight: 157 lb (71.2 kg)   Height: 5\' 6"  (1.676 m)    Body mass index is 25.34 kg/m. Gen:  WD/WN, NAD Head: [place]/AT, No temporalis wasting.  Ear/Nose/Throat: Hearing grossly intact, nares w/o erythema or drainage, poor dentition Eyes: PER, EOMI, sclera nonicteric.  Neck: Supple, no masses.  No bruit or JVD.  Pulmonary:  Good air movement, clear to auscultation bilaterally, no use of accessory muscles.  Cardiac: RRR, normal S1, S2, no Murmurs. Vascular:   left carotid bruit and subclavian bruit Vessel Right Left  Radial Palpable Palpable  Brachial Palpable Palpable  Carotid Palpable Palpable  Gastrointestinal: soft, non-distended. No guarding/no peritoneal signs.  Musculoskeletal: M/S 5/5 throughout.  No deformity or atrophy.  Neurologic: CN 2-12 intact. Pain and light touch intact in extremities.  Symmetrical.  Speech is fluent. Motor exam as listed above. Psychiatric: Judgment intact, Mood & affect appropriate for pt's clinical situation. Dermatologic: No rashes or ulcers noted.  No changes consistent with cellulitis. Lymph : No Cervical lymphadenopathy, no lichenification or skin changes of chronic lymphedema.  CBC Lab Results  Component Value Date   WBC 14.1 (H) 09/12/2020   HGB 14.8 09/12/2020   HCT 42.4 09/12/2020   MCV 89.1 09/12/2020   PLT 304 09/12/2020    BMET    Component Value Date/Time   NA 136 11/28/2020 0755   NA 137 11/06/2012 0034   K 4.8 11/28/2020 0755   K 3.8 11/06/2012 0034   CL 100 11/28/2020 0755   CL 104 11/06/2012 0034    CO2 29 11/28/2020 0755   CO2 26 11/06/2012 0034   GLUCOSE 106 (H) 11/28/2020 0755   GLUCOSE 127 (H) 11/06/2012 0034   BUN 10 11/28/2020 0755   BUN 12 11/06/2012 0034   CREATININE 1.01 11/28/2020 0755   CREATININE 0.96 11/06/2012 0034   CALCIUM 9.8 11/28/2020 0755   CALCIUM 8.3 (L) 11/06/2012 0034   GFRNONAA >60 09/12/2020 2355   GFRNONAA >60 11/06/2012 0034   GFRAA >60 03/06/2020 1005   GFRAA >60 11/06/2012 0034   CrCl cannot be calculated (Patient's most recent lab result is older than the maximum 21 days allowed.).  COAG Lab Results  Component Value Date   INR 1.0 09/11/2020    Radiology No results found.   Assessment/Plan 1. Subclavian artery stenosis, left (HCC) Recommend:  Given the patient's asymptomatic stenosis of the left subclavian no further invasive testing or surgery at this time.  Duplex ultrasound shows antegrade vertebral flow bilaterally.  Continue antiplatelet therapy as prescribed Continue management of CAD, HTN and Hyperlipidemia  Healthy heart diet,  encouraged exercise at least 4 times per week  Follow up in 12 months with duplex ultrasound and physical exam.    - VAS US CAROTID; Future  2. Bilateral carotid artery stenosis Recommend:  Given the patient's asymptomatic subcritical stenosis no further invasive testing or surgery at this time.  Duplex ultrasound shows <40% stenosis bilaterally.  Continue antiplatelet therapy as prescribed Continue management of CAD, HTN and Hyperlipidemia.  Healthy heart diet,  encouraged exercise at least 4 times per week.  Follow up in 12 months with duplex ultrasound and physical exam.    - VAS US CAROTID; Future  3. CAD in native artery Continue cardiac and antihypertensive medications as already ordered and reviewed, no changes at this time.  Continue statin as ordered and reviewed, no changes at this time  Nitrates PRN for chest pain   4. Benign essential HTN Continue antihypertensive  medications as already ordered, these medications have been reviewed and there are no changes at this time.   5. Hyperlipidemia, unspecified hyperlipidemia type Continue statin as ordered  and reviewed, no changes at this time     Hortencia Pilar, MD  02/02/2021 10:38 AM

## 2021-02-03 ENCOUNTER — Encounter: Payer: PPO | Admitting: *Deleted

## 2021-02-03 DIAGNOSIS — I214 Non-ST elevation (NSTEMI) myocardial infarction: Secondary | ICD-10-CM | POA: Diagnosis not present

## 2021-02-03 NOTE — Progress Notes (Signed)
Daily Session Note  Patient Details  Name: James Peck MRN: [id number] Date of Birth: 1940-12-11 Referring Provider:   Flowsheet Row Cardiac Rehab from 11/07/2020 in [facility] Cardiac and Pulmonary Rehab  Referring Provider Bartholome Bill MD       Encounter Date: 02/03/2021  Check In:  Session Check In - 02/03/21 0937       Check-In   Supervising physician immediately available to respond to emergencies See telemetry face sheet for immediately available ER MD    Location [facility]-Cardiac & Pulmonary Rehab    Staff Present Renita Papa, RN BSN;Melissa Tilford Pillar, RDN, LDN;Jessica Luan Pulling, MA, RCEP, CCRP, CCET    Virtual Visit No    Medication changes reported     No    Fall or balance concerns reported    No    Warm-up and Cool-down Performed on first and last piece of equipment    Resistance Training Performed Yes    VAD Patient? No    PAD/SET Patient? No      Pain Assessment   Currently in Pain? No/denies                Social History   Tobacco Use  Smoking Status Former   Packs/day: 1.00   Years: 40.00   Pack years: 40.00   Types: Cigarettes   Quit date: 2000   Years since quitting: 22.5  Smokeless Tobacco Never    Goals Met:  Independence with exercise equipment Exercise tolerated well No report of cardiac concerns or symptoms Strength training completed today  Goals Unmet:  Not Applicable  Comments: Pt able to follow exercise prescription today without complaint.  Will continue to monitor for progression.    Dr. Emily Filbert is Medical Director for [place].  Dr. Ottie Glazier is Medical Director for [facility] Pulmonary Rehabilitation.

## 2021-02-04 ENCOUNTER — Encounter (INDEPENDENT_AMBULATORY_CARE_PROVIDER_SITE_OTHER): Payer: Self-pay | Admitting: Vascular Surgery

## 2021-02-04 DIAGNOSIS — I779 Disorder of arteries and arterioles, unspecified: Secondary | ICD-10-CM | POA: Insufficient documentation

## 2021-02-04 DIAGNOSIS — I771 Stricture of artery: Secondary | ICD-10-CM | POA: Insufficient documentation

## 2021-02-04 DIAGNOSIS — I6529 Occlusion and stenosis of unspecified carotid artery: Secondary | ICD-10-CM | POA: Insufficient documentation

## 2021-02-06 ENCOUNTER — Encounter: Payer: PPO | Admitting: *Deleted

## 2021-02-06 ENCOUNTER — Other Ambulatory Visit: Payer: Self-pay

## 2021-02-06 DIAGNOSIS — I214 Non-ST elevation (NSTEMI) myocardial infarction: Secondary | ICD-10-CM

## 2021-02-06 NOTE — Progress Notes (Signed)
Cardiac Individual Treatment Plan  Patient Details  Name: James Peck MRN: [id number] Date of Birth: Aug 24, 1940 Referring Provider:   Flowsheet Row Cardiac Rehab from 11/07/2020 in [facility] Cardiac and Pulmonary Rehab  Referring Provider Bartholome Bill MD       Initial Encounter Date:  Flowsheet Row Cardiac Rehab from 11/07/2020 in [facility] Cardiac and Pulmonary Rehab  Date 11/07/20       Visit Diagnosis: NSTEMI (non-ST elevation myocardial infarction) [facility])  Patient's Home Medications on Admission:  Current Outpatient Medications:    albuterol (VENTOLIN HFA) 108 (90 Base) MCG/ACT inhaler, Inhale 1-2 puffs into the lungs every 6 (six) hours as needed for wheezing or shortness of breath., Disp: 18 g, Rfl: 1   ANORO ELLIPTA 62.5-25 MCG/INH AEPB, Inhale 1 puff into the lungs daily., Disp: 90 each, Rfl: 3   aspirin EC 81 MG tablet, Take 81 mg by mouth daily. , Disp: , Rfl:    calcium carbonate (OS-CAL - DOSED IN MG OF ELEMENTAL CALCIUM) 1250 (500 CA) MG tablet, Take 1 tablet by mouth daily. , Disp: , Rfl:    cetirizine (ZYRTEC) 10 MG tablet, TAKE 1 TABLET BY MOUTH DAILY, Disp: 90 tablet, Rfl: 1   chlorpheniramine (CHLOR-TRIMETON) 4 MG tablet, Take 4 mg by mouth 2 (two) times daily as needed for allergies., Disp: , Rfl:    CINNAMON PO, Take 1,200 mg by mouth 2 times daily at 12 noon and 4 pm., Disp: , Rfl:    clopidogrel (PLAVIX) 75 MG tablet, Take 1 tablet (75 mg total) by mouth daily., Disp: 30 tablet, Rfl: 0   dextromethorphan-guaiFENesin (MUCINEX DM) 30-600 MG 12hr tablet, Take 1 tablet by mouth 2 (two) times daily., Disp: , Rfl:    diclofenac sodium (VOLTAREN) 1 % GEL, Apply 2 g 2 (two) times daily topically. To affected joint., Disp: 300 g, Rfl: 11   famotidine (PEPCID) 20 MG tablet, Take 1 tablet (20 mg total) by mouth daily., Disp: 90 tablet, Rfl: 1   fluticasone (FLONASE) 50 MCG/ACT nasal spray, TAKE 2 SPRAYS INTO BOTH NOSTRILS DAILY, Disp: 16 g, Rfl: 3   GLUCOSAMINE-CHONDROITIN PO, Take  1 capsule by mouth daily., Disp: , Rfl:    Misc Natural Products (BLACK CHERRY CONCENTRATE PO), Take 1,000 mg daily by mouth., Disp: , Rfl:    nitroGLYCERIN (NITROSTAT) 0.4 MG SL tablet, Place 1 tablet (0.4 mg total) under the tongue every 5 (five) minutes as needed for chest pain., Disp: 30 tablet, Rfl: 0   Omega-3 Fatty Acids (FISH OIL) 1000 MG CAPS, Take 1,000 mg by mouth daily. , Disp: , Rfl:    rosuvastatin (CRESTOR) 20 MG tablet, Take 1 tablet (20 mg total) by mouth daily., Disp: 30 tablet, Rfl: 0   sildenafil (REVATIO) 20 MG tablet, TAKE 1 TO 2 TABLETS BY MOUTH EVERY DAY AS NEEDED, Disp: 60 tablet, Rfl: 0   telmisartan (MICARDIS) 20 MG tablet, TAKE ONE TABLET (20 MG) BY MOUTH EVERY DAY, Disp: 90 tablet, Rfl: 1  Past Medical History: Past Medical History:  Diagnosis Date   Allergy    Arthritis    Coronary artery disease    GERD (gastroesophageal reflux disease)    History of hiatal hernia    Hypercholesteremia    Hypertension     Tobacco Use: Social History   Tobacco Use  Smoking Status Former   Packs/day: 1.00   Years: 40.00   Pack years: 40.00   Types: Cigarettes   Quit date: 2000   Years since quitting: 48.5  Smokeless Tobacco Never    Labs: Recent Review Flowsheet Data     Labs for ITP Cardiac and Pulmonary Rehab Latest Ref Rng & Units 11/06/2019 03/15/2020 07/15/2020 09/12/2020 11/28/2020   Cholestrol 0 - 200 mg/dL 155 142 140 157 123   LDLCALC 0 - 99 mg/dL 93 76 79 93 68   HDL >39.00 mg/dL 43.10 42.30 41.10 54 37.20(L)   Trlycerides 0.0 - 149.0 mg/dL 96.0 118.0 98.0 49 90.0   Hemoglobin A1c 4.6 - 6.5 % 5.9 6.3 6.2 6.2(H) 6.1        Exercise Target Goals: Exercise Program Goal: Individual exercise prescription set using results from initial 6 min walk test and THRR while considering  patient's activity barriers and safety.   Exercise Prescription Goal: Initial exercise prescription builds to 30-45 minutes a day of aerobic activity, 2-3 days per week.  Home  exercise guidelines will be given to patient during program as part of exercise prescription that the participant will acknowledge.   Education: Aerobic Exercise: - Group verbal and visual presentation on the components of exercise prescription. Introduces F.I.T.T principle from ACSM for exercise prescriptions.  Reviews F.I.T.T. principles of aerobic exercise including progression. Written material given at graduation. Flowsheet Row Cardiac Rehab from 01/18/2021 in [facility] Cardiac and Pulmonary Rehab  Date 11/16/20  Educator AS  Instruction Review Code 1- Verbalizes Understanding       Education: Resistance Exercise: - Group verbal and visual presentation on the components of exercise prescription. Introduces F.I.T.T principle from ACSM for exercise prescriptions  Reviews F.I.T.T. principles of resistance exercise including progression. Written material given at graduation. Flowsheet Row Cardiac Rehab from 01/18/2021 in [facility] Cardiac and Pulmonary Rehab  Date 11/23/20  Educator AS  Instruction Review Code 1- Verbalizes Understanding        Education: Exercise & Equipment Safety: - Individual verbal instruction and demonstration of equipment use and safety with use of the equipment. Flowsheet Row Cardiac Rehab from 01/18/2021 in [facility] Cardiac and Pulmonary Rehab  Date 11/07/20  Educator [facility]  Instruction Review Code 1- Verbalizes Understanding       Education: Exercise Physiology & General Exercise Guidelines: - Group verbal and written instruction with models to review the exercise physiology of the cardiovascular system and associated critical values. Provides general exercise guidelines with specific guidelines to those with heart or lung disease.    Education: Flexibility, Balance, Mind/Body Relaxation: - Group verbal and visual presentation with interactive activity on the components of exercise prescription. Introduces F.I.T.T principle from ACSM for exercise prescriptions. Reviews  F.I.T.T. principles of flexibility and balance exercise training including progression. Also discusses the mind body connection.  Reviews various relaxation techniques to help reduce and manage stress (i.e. Deep breathing, progressive muscle relaxation, and visualization). Balance handout provided to take home. Written material given at graduation.   Activity Barriers & Risk Stratification:  Activity Barriers & Cardiac Risk Stratification - 11/07/20 1035       Activity Barriers & Cardiac Risk Stratification   Activity Barriers Muscular Weakness;Deconditioning;Shortness of Breath;Joint Problems   torn menicus in left knee, previous r shoulder/rotator cuff injury   Cardiac Risk Stratification High             6 Minute Walk:  6 Minute Walk     Row Name 11/07/20 1034 01/16/21 1038       6 Minute Walk   Phase Initial Discharge    Distance 1215 feet 1540 feet    Distance % Change -- 26.7 %  Distance Feet Change -- 325 ft    Walk Time 6 minutes 6 minutes    # of Rest Breaks 0 0    MPH 2.3 2.9    METS 2.37 3.14    RPE 13 13    Perceived Dyspnea  2 --    VO2 Peak 8.29 11    Symptoms Yes (comment) No    Comments SOB, "wore wrong shoes today", shoes bothered his ankle --    Resting HR 68 bpm 63 bpm    Resting BP 122/64 138/62    Resting Oxygen Saturation  94 % 98 %    Exercise Oxygen Saturation  during 6 min walk 94 % 91 %    Max Ex. HR 103 bpm 92 bpm    Max Ex. BP 130/74 178/82    2 Minute Post BP 116/64 140/70             Oxygen Initial Assessment:   Oxygen Re-Evaluation:   Oxygen Discharge (Final Oxygen Re-Evaluation):   Initial Exercise Prescription:  Initial Exercise Prescription - 11/07/20 1000       Date of Initial Exercise RX and Referring Provider   Date 11/07/20    Referring Provider Bartholome Bill MD      Treadmill   MPH 2.3    Grade 0    Minutes 15    METs 2.76      Recumbant Bike   Level 2    RPM 50    Watts 10    Minutes 15    METs  2.5      NuStep   Level 2    SPM 80    Minutes 15    METs 2.5      REL-XR   Level 1    Speed 50    Minutes 15    METs 2.5      Prescription Details   Frequency (times per week) 3    Duration Progress to 30 minutes of continuous aerobic without signs/symptoms of physical distress      Intensity   THRR 40-80% of Max Heartrate 97-126    Ratings of Perceived Exertion 11-13    Perceived Dyspnea 0-4      Progression   Progression Continue to progress workloads to maintain intensity without signs/symptoms of physical distress.      Resistance Training   Training Prescription Yes    Weight 4 lb    Reps 10-15             Perform Capillary Blood Glucose checks as needed.  Exercise Prescription Changes:   Exercise Prescription Changes     Row Name 11/07/20 1000 11/24/20 0900 12/02/20 1000 12/06/20 1500 12/21/20 1600     Response to Exercise   Blood Pressure (Admit) 122/64 114/60 -- 126/62 142/72   Blood Pressure (Exercise) 130/74 148/56 -- 142/76 132/60   Blood Pressure (Exit) 116/64 110/58 -- 114/54 112/66   Heart Rate (Admit) 68 bpm 65 bpm -- 76 bpm 60 bpm   Heart Rate (Exercise) 103 bpm 107 bpm -- 115 bpm 95 bpm   Heart Rate (Exit) 73 bpm 82 bpm -- 107 bpm 75 bpm   Oxygen Saturation (Admit) 94 % -- -- -- --   Oxygen Saturation (Exercise) 94 % -- -- -- --   Rating of Perceived Exertion (Exercise) 13 13 -- 12 11   Perceived Dyspnea (Exercise) 2 -- -- -- --   Symptoms SOB, ankle aggravated from shoes none -- none none  Comments walk test results -- -- -- --   Duration -- Continue with 30 min of aerobic exercise without signs/symptoms of physical distress. -- Continue with 30 min of aerobic exercise without signs/symptoms of physical distress. Continue with 30 min of aerobic exercise without signs/symptoms of physical distress.   Intensity -- THRR unchanged -- THRR unchanged THRR unchanged     Progression   Progression -- Continue to progress workloads to maintain  intensity without signs/symptoms of physical distress. -- Continue to progress workloads to maintain intensity without signs/symptoms of physical distress. Continue to progress workloads to maintain intensity without signs/symptoms of physical distress.   Average METs -- 3.25 -- 3.41 3.26     Resistance Training   Training Prescription -- Yes -- Yes Yes   Weight -- 4 lb -- 4 lb 4 lb   Reps -- 10-15 -- 10-15 10-15     Interval Training   Interval Training -- -- -- No No     Treadmill   MPH -- 2.4 -- 2.5 2.5   Grade -- 0.5 -- 1 1   Minutes -- 15 -- 15 15   METs -- 3 -- 3.26 3.26     Recumbant Bike   Level -- -- -- 2 2   Watts -- -- -- 34 27   Minutes -- -- -- 15 15   METs -- -- -- 3.57 3.2     REL-XR   Level -- 1 -- 2 --   Minutes -- 15 -- 15 --   METs -- 3.5 -- -- --     Home Exercise Plan   Plans to continue exercise at -- -- Home (comment)  walking, cross trainer Home (comment)  walking, cross trainer Home (comment)  walking, cross trainer   Frequency -- -- Add 2 additional days to program exercise sessions. Add 2 additional days to program exercise sessions. Add 2 additional days to program exercise sessions.   Initial Home Exercises Provided -- -- 12/02/20 12/02/20 12/02/20    Row Name 01/04/21 1300 01/18/21 1600 01/31/21 1600         Response to Exercise   Blood Pressure (Admit) 126/64 138/62 126/64     Blood Pressure (Exercise) 164/70 178/82 --     Blood Pressure (Exit) 108/62 100/58 136/56     Heart Rate (Admit) 67 bpm 63 bpm 63 bpm     Heart Rate (Exercise) 106 bpm 92 bpm 105 bpm     Heart Rate (Exit) 77 bpm 78 bpm 78 bpm     Rating of Perceived Exertion (Exercise) 12 13 12      Symptoms none none none     Duration Continue with 30 min of aerobic exercise without signs/symptoms of physical distress. Continue with 30 min of aerobic exercise without signs/symptoms of physical distress. Continue with 30 min of aerobic exercise without signs/symptoms of physical  distress.     Intensity THRR unchanged THRR unchanged THRR unchanged           Progression       Progression Continue to progress workloads to maintain intensity without signs/symptoms of physical distress. Continue to progress workloads to maintain intensity without signs/symptoms of physical distress. Continue to progress workloads to maintain intensity without signs/symptoms of physical distress.     Average METs 3.6 -- 3.8           Resistance Training       Training Prescription Yes Yes Yes     Weight 4 lb 4 lb  4 lb     Reps 10-15 10-15 10-15           Interval Training       Interval Training No No No           Treadmill       MPH 2.7 -- 2.7     Grade 1.5 -- 1.5     Minutes 15 -- 15     METs 3.63 -- 3.63           Recumbant Bike       Level 2 -- --     Watts 37 -- --     Minutes 15 -- --     METs 3.59 -- --           NuStep       Level -- -- 4     Minutes -- -- 15     METs -- -- 3.1           Elliptical       Level -- -- 1     Speed -- -- 2.5     Minutes -- -- 15     METs -- -- 2.5           REL-XR       Level 3 3 3      Minutes 15 15 15            Biostep-RELP       Level -- -- 3     Minutes -- -- 15     METs -- -- 3           Home Exercise Plan       Plans to continue exercise at Home (comment)  walking, cross trainer Home (comment)  walking, cross trainer Home (comment)  walking, cross trainer     Frequency Add 2 additional days to program exercise sessions. Add 2 additional days to program exercise sessions. Add 2 additional days to program exercise sessions.     Initial Home Exercises Provided 12/02/20 12/02/20 12/02/20             Exercise Comments:   Exercise Comments     Row Name 02/06/21 1107           Exercise Comments Kainalu graduated today from  rehab with 35 sessions completed.  Details of the patient's exercise prescription and what He needs to do in order to continue the prescription and progress were discussed with patient.   Patient was given a copy of prescription and goals.  Patient verbalized understanding.  Dyan plans to continue to exercise by joining a local gym.                Exercise Goals and Review:   Exercise Goals     Row Name 11/07/20 1038             Exercise Goals   Increase Physical Activity Yes       Intervention Provide advice, education, support and counseling about physical activity/exercise needs.;Develop an individualized exercise prescription for aerobic and resistive training based on initial evaluation findings, risk stratification, comorbidities and participant's personal goals.       Expected Outcomes Short Term: Attend rehab on a regular basis to increase amount of physical activity.;Long Term: Add in home exercise to make exercise part of routine and to increase amount of physical activity.;Long Term: Exercising regularly at least 3-5 days a week.       Increase Strength  and Stamina Yes       Intervention Provide advice, education, support and counseling about physical activity/exercise needs.;Develop an individualized exercise prescription for aerobic and resistive training based on initial evaluation findings, risk stratification, comorbidities and participant's personal goals.       Expected Outcomes Short Term: Increase workloads from initial exercise prescription for resistance, speed, and METs.;Short Term: Perform resistance training exercises routinely during rehab and add in resistance training at home;Long Term: Improve cardiorespiratory fitness, muscular endurance and strength as measured by increased METs and functional capacity (6MWT)       Able to understand and use rate of perceived exertion (RPE) scale Yes       Intervention Provide education and explanation on how to use RPE scale       Expected Outcomes Short Term: Able to use RPE daily in rehab to express subjective intensity level;Long Term:  Able to use RPE to guide intensity level when exercising  independently       Able to understand and use Dyspnea scale Yes       Intervention Provide education and explanation on how to use Dyspnea scale       Expected Outcomes Short Term: Able to use Dyspnea scale daily in rehab to express subjective sense of shortness of breath during exertion;Long Term: Able to use Dyspnea scale to guide intensity level when exercising independently       Knowledge and understanding of Target Heart Rate Range (THRR) Yes       Intervention Provide education and explanation of THRR including how the numbers were predicted and where they are located for reference       Expected Outcomes Long Term: Able to use THRR to govern intensity when exercising independently;Short Term: Able to use daily as guideline for intensity in rehab;Short Term: Able to state/look up THRR       Able to check pulse independently Yes       Intervention Provide education and demonstration on how to check pulse in carotid and radial arteries.;Review the importance of being able to check your own pulse for safety during independent exercise       Expected Outcomes Short Term: Able to explain why pulse checking is important during independent exercise;Long Term: Able to check pulse independently and accurately       Understanding of Exercise Prescription Yes       Intervention Provide education, explanation, and written materials on patient's individual exercise prescription       Expected Outcomes Short Term: Able to explain program exercise prescription;Long Term: Able to explain home exercise prescription to exercise independently                Exercise Goals Re-Evaluation :  Exercise Goals Re-Evaluation     Row Name 11/24/20 6387 12/02/20 1025 12/06/20 1540 12/21/20 1618 01/04/21 1319     Exercise Goal Re-Evaluation   Exercise Goals Review Increase Physical Activity;Increase Strength and Stamina Increase Physical Activity;Increase Strength and Stamina;Able to understand and use rate of  perceived exertion (RPE) scale;Able to understand and use Dyspnea scale;Able to check pulse independently;Knowledge and understanding of Target Heart Rate Range (THRR);Understanding of Exercise Prescription Increase Physical Activity;Increase Strength and Stamina;Understanding of Exercise Prescription Increase Physical Activity;Increase Strength and Stamina Increase Physical Activity;Increase Strength and Stamina;Understanding of Exercise Prescription   Comments Macdonald is progressing well and has increased speed and grade on TM.  Staff will monitor progress. Reviewed home exercise with pt today.  Pt plans to walk on track  at home and use cross trainer at home for exercise.  We also talked about access to staff videos on YouTube.  Reviewed THR, pulse, RPE, sign and symptoms, pulse oximetery and when to call 911 or MD.  Also discussed weather considerations and indoor options.  Pt voiced understanding. Virgle is doing well in rehab.  He is up to 34 watts on the bike.  We will continue to montior his progress. Marrion attends consistently and reaches low end of THR range.  He is at RPE 11.  Staff wil encourage increasing workloads. Nakota has been doing well in rehab.  He is currently out due to [place] exposure.  He is nearing graduation and should improve his post 6MWT.  We will continue to monitor his progress.   Expected Outcomes Short:  attend consistently Long: build overall stamina Short: Get back to walking daily again Long: Continue to exercise independently Short: Increase workloads Long: Continue to build stamina. Short: increase workloads and work in [employer] range Long:  increase overall stamina Short: Return to regular attendance Long: Continue to improve stamina    Row Name 01/18/21 1618 01/31/21 1608           Exercise Goal Re-Evaluation   Exercise Goals Review Increase Physical Activity;Increase Strength and Stamina Increase Physical Activity;Increase Strength and Stamina;Understanding of Exercise  Prescription      Comments Larri improved 6MWT by 325 feet ! Phinehas is getting ready to graduate.  He is planning to walk to continue to exercise.      Expected Outcomes Short: complete HT Long: maintain exercise on his own Graduate and continue to exercise independently.               Discharge Exercise Prescription (Final Exercise Prescription Changes):  Exercise Prescription Changes - 01/31/21 1600       Response to Exercise   Blood Pressure (Admit) 126/64    Blood Pressure (Exit) 136/56    Heart Rate (Admit) 63 bpm    Heart Rate (Exercise) 105 bpm    Heart Rate (Exit) 78 bpm    Rating of Perceived Exertion (Exercise) 12    Symptoms none    Duration Continue with 30 min of aerobic exercise without signs/symptoms of physical distress.    Intensity THRR unchanged      Progression   Progression Continue to progress workloads to maintain intensity without signs/symptoms of physical distress.    Average METs 3.8      Resistance Training   Training Prescription Yes    Weight 4 lb    Reps 10-15      Interval Training   Interval Training No      Treadmill   MPH 2.7    Grade 1.5    Minutes 15    METs 3.63      NuStep   Level 4    Minutes 15    METs 3.1      Elliptical   Level 1    Speed 2.5    Minutes 15    METs 2.5      REL-XR   Level 3    Minutes 15      Biostep-RELP   Level 3    Minutes 15    METs 3      Home Exercise Plan   Plans to continue exercise at Home (comment)   walking, cross trainer   Frequency Add 2 additional days to program exercise sessions.    Initial Home Exercises Provided 12/02/20  Nutrition:  Target Goals: Understanding of nutrition guidelines, daily intake of sodium 1500mg , cholesterol 200mg , calories 30% from fat and 7% or less from saturated fats, daily to have 5 or more servings of fruits and vegetables.  Education: All About Nutrition: -Group instruction provided by verbal, written material, interactive  activities, discussions, models, and posters to present general guidelines for heart healthy nutrition including fat, fiber, MyPlate, the role of sodium in heart healthy nutrition, utilization of the nutrition label, and utilization of this knowledge for meal planning. Follow up email sent as well. Written material given at graduation. Flowsheet Row Cardiac Rehab from 01/18/2021 in [facility] Cardiac and Pulmonary Rehab  Education need identified 11/07/20  Date 12/07/20  Educator [place]  Instruction Review Code 1- Verbalizes Understanding       Biometrics:  Pre Biometrics - 11/07/20 1039       Pre Biometrics   Height 5' 6.1" (1.679 m)    Weight 159 lb 6.4 oz (72.3 kg)    BMI (Calculated) 25.65    Single Leg Stand 30 seconds              Nutrition Therapy Plan and Nutrition Goals:  Nutrition Therapy & Goals - 11/23/20 0915       Nutrition Therapy   Diet Heart healhty, low Na    Drug/Food Interactions Statins/Certain Fruits    Protein (specify units) 55g    Fiber 30 grams    Whole Grain Foods 3 servings    Saturated Fats 12 max. grams    Fruits and Vegetables 8 servings/day    Sodium 1.5 grams      Personal Nutrition Goals   Nutrition Goal ST: ensure getting a rainbow of plant foods weekly  LT: continue with current diet    Comments Breakfast : breakfast bar with some fruit and yogurt - coffee (stevia and creamer - decaff) Lunch: most of the time when home sandwich (banana with lite mayo, [place], bologna on whole wheat) - stevia tea (decaff)  with some potato chips Dinner: last night - salad with grilled chicken. They eat a lot of grilled chicken, will limit red meat. He eats flounder most of the time when he eats fish. Sometimes they will have pork chops sometimes. Most of the time mostly vegetables - slaw, peas, salad greens, pintos, sweet potato, tomatoes, turnip greens, sometimes mashed potatoes. Snack: fruit Drinks: diet coke or water or stevia tea. He tries to limit his cheese  intake and he doesn't eat much pasta. His wife will be the one to cook - she use oil, but he doesn't know what kind. He reports that she cooks with very little salt. He reports going out to eat Friday nights (grilled chicken sandwich or salad) and Sunday nights will go out and get fish or grilled chicken. [place]. Discussed heart healthy eating. He would not like to make any further changes at this time.      Intervention Plan   Intervention Prescribe, educate and counsel regarding individualized specific dietary modifications aiming towards targeted core components such as weight, hypertension, lipid management, diabetes, heart failure and other comorbidities.;Nutrition handout(s) given to patient.    Expected Outcomes Short Term Goal: Understand basic principles of dietary content, such as calories, fat, sodium, cholesterol and nutrients.;Short Term Goal: A plan has been developed with personal nutrition goals set during dietitian appointment.;Long Term Goal: Adherence to prescribed nutrition plan.             Nutrition Assessments:  MEDIFICTS Score Key: ?70 Need to make dietary changes  40-70 Heart Healthy Diet ? 40 Therapeutic Level Cholesterol Diet  Flowsheet Row Cardiac Rehab from 01/20/2021 in [facility] Cardiac and Pulmonary Rehab  Picture Your Plate Total Score on Discharge 66      Picture Your Plate Scores: <35 Unhealthy dietary pattern with much room for improvement. 41-50 Dietary pattern unlikely to meet recommendations for good health and room for improvement. 51-60 More healthful dietary pattern, with some room for improvement.  >60 Healthy dietary pattern, although there may be some specific behaviors that could be improved.    Nutrition Goals Re-Evaluation:  Nutrition Goals Re-Evaluation     [place] Name 01/27/21 1007             Goals   Current Weight 156 lb (70.8 kg)       Nutrition Goal No goals at this time.       Comment Webster feels like his eating  habits are good and is happy with them.       Expected Outcome Short: eat a heart healthy diet. Long: maintain a heart healthy diet independently                Nutrition Goals Discharge (Final Nutrition Goals Re-Evaluation):  Nutrition Goals Re-Evaluation - 01/27/21 1007       Goals   Current Weight 156 lb (70.8 kg)    Nutrition Goal No goals at this time.    Comment Ziair feels like his eating habits are good and is happy with them.    Expected Outcome Short: eat a heart healthy diet. Long: maintain a heart healthy diet independently             Psychosocial: Target Goals: Acknowledge presence or absence of significant depression and/or stress, maximize coping skills, provide positive support system. Participant is able to verbalize types and ability to use techniques and skills needed for reducing stress and depression.   Education: Stress, Anxiety, and Depression - Group verbal and visual presentation to define topics covered.  Reviews how body is impacted by stress, anxiety, and depression.  Also discusses healthy ways to reduce stress and to treat/manage anxiety and depression.  Written material given at graduation.   Education: Sleep Hygiene -Provides group verbal and written instruction about how sleep can affect your health.  Define sleep hygiene, discuss sleep cycles and impact of sleep habits. Review good sleep hygiene tips.    Initial Review & Psychosocial Screening:  Initial Psych Review & Screening - 11/04/20 1012       Initial Review   Current issues with None Identified      Family Dynamics   Good Support System? Yes   wife and 2 kidsd live close by with their families, siblings     Barriers   Psychosocial barriers to participate in program There are no identifiable barriers or psychosocial needs.;The patient should benefit from training in stress management and relaxation.      Screening Interventions   Interventions Encouraged to exercise     Expected Outcomes Short Term goal: Utilizing psychosocial counselor, staff and physician to assist with identification of specific Stressors or current issues interfering with healing process. Setting desired goal for each stressor or current issue identified.;Long Term Goal: Stressors or current issues are controlled or eliminated.;Short Term goal: Identification and review with participant of any Quality of Life or Depression concerns found by scoring the questionnaire.;Long Term goal: The participant improves quality of Life and PHQ9 Scores as seen  by post scores and/or verbalization of changes             Quality of Life Scores:   Quality of Life - 01/20/21 0950       Quality of Life   Select Quality of Life      Quality of Life Scores   Health/Function Pre 23.07 %    Health/Function Post 26 %    Health/Function % Change 12.7 %    Socioeconomic Pre 26.57 %    Socioeconomic Post 28.93 %    Socioeconomic % Change  8.88 %    Psych/Spiritual Pre 29.14 %    Psych/Spiritual Post 30 %    Psych/Spiritual % Change 2.95 %    Family Pre 28.3 %    Family Post 30 %    Family % Change 6.01 %    GLOBAL Pre 25.81 %    GLOBAL Post 28.01 %    GLOBAL % Change 8.52 %            Scores of 19 and below usually indicate a poorer quality of life in these areas.  A difference of  2-3 points is a clinically meaningful difference.  A difference of 2-3 points in the total score of the Quality of Life Index has been associated with significant improvement in overall quality of life, self-image, physical symptoms, and general health in studies assessing change in quality of life.  PHQ-9: Recent Review Flowsheet Data     Depression screen [facility] 2/9 01/20/2021 12/07/2020 11/07/2020 07/01/2020 06/30/2019   Decreased Interest 0 0 1 0 0   Down, Depressed, Hopeless 0 0 0 0 0   PHQ - 2 Score 0 0 1 0 0   Altered sleeping 0 0 1 - -   Tired, decreased energy 0 0 1 - -   Change in appetite 0 0 0 - -   Feeling  bad or failure about yourself  0 0 0 - -   Trouble concentrating 0 0 1 - -   Moving slowly or fidgety/restless 0 0 2 - -   Suicidal thoughts 0 0 0 - -   PHQ-9 Score 0 0 6 - -   Difficult doing work/chores - Not difficult at all Not difficult at all - -      Interpretation of Total Score  Total Score Depression Severity:  1-4 = Minimal depression, 5-9 = Mild depression, 10-14 = Moderate depression, 15-19 = Moderately severe depression, 20-27 = Severe depression   Psychosocial Evaluation and Intervention:  Psychosocial Evaluation - 11/04/20 1024       Psychosocial Evaluation & Interventions   Interventions Encouraged to exercise with the program and follow exercise prescription    Comments Aristidis has no barriers to attending the program. He is ready to start. He lives with his wife of 53 years. His 2 sons and their families live 1 and 2 doors down from his home. His entire family is is support system as well as his siblings and their families. He likes to work with old cars, has always raised a garden. His wife sews.  He said that keeps them busy. He states he has no stress, depression nor aniexty at this time. He should do well in the program.    Expected Outcomes STG Alim attends all scheduled sessions.  He continues to live stress and depression free.  LTG Kacee is able to continue any lifestyle changes learned in the program.    Continue Psychosocial Services  Follow  up required by staff             Psychosocial Re-Evaluation:  Psychosocial Re-Evaluation     [place] Name 12/02/20 1001 12/07/20 1014 01/27/21 1006         Psychosocial Re-Evaluation   Current issues with None Identified None Identified None Identified     Comments Patient reports no issues with their current mental states, sleep, stress, depression or anxiety. Will follow up with patient in a few weeks for any changes. Redid Evaan's PHQ today.  He was all clear. He is feeling better and has more energy again.  Even his  wife has noticed the improvement!! Patient reports no issues with their current mental states, sleep, stress, depression or anxiety. Will follow up with patient in a few weeks for any changes. He is going to be graduation Rehab soon and has a positive outlook on his health.     Expected Outcomes Short: Continue to exercise regularly to support mental health and notify staff of any changes. Long: maintain mental health and well being through teaching of rehab or prescribed medications independently. Short: Continue to exercise regularly to support mental health and notify staff of any changes. Long: maintain mental health and well being through teaching of rehab or prescribed medications independently. --     Interventions Encouraged to attend Cardiac Rehabilitation for the exercise Encouraged to attend Cardiac Rehabilitation for the exercise --     Continue Psychosocial Services  Follow up required by staff -- No Follow up required              Psychosocial Discharge (Final Psychosocial Re-Evaluation):  Psychosocial Re-Evaluation - 01/27/21 1006       Psychosocial Re-Evaluation   Current issues with None Identified    Comments Patient reports no issues with their current mental states, sleep, stress, depression or anxiety. Will follow up with patient in a few weeks for any changes. He is going to be graduation Rehab soon and has a positive outlook on his health.    Continue Psychosocial Services  No Follow up required             Vocational Rehabilitation: Provide vocational rehab assistance to qualifying candidates.   Vocational Rehab Evaluation & Intervention:  Vocational Rehab - 11/04/20 1028       Initial Vocational Rehab Evaluation & Intervention   Assessment shows need for Vocational Rehabilitation No             Education: Education Goals: Education classes will be provided on a variety of topics geared toward better understanding of heart health and risk factor  modification. Participant will state understanding/return demonstration of topics presented as noted by education test scores.  Learning Barriers/Preferences:   General Cardiac Education Topics:  AED/CPR: - Group verbal and written instruction with the use of models to demonstrate the basic use of the AED with the basic ABC's of resuscitation.   Anatomy and Cardiac Procedures: - Group verbal and visual presentation and models provide information about basic cardiac anatomy and function. Reviews the testing methods done to diagnose heart disease and the outcomes of the test results. Describes the treatment choices: Medical Management, Angioplasty, or Coronary Bypass Surgery for treating various heart conditions including Myocardial Infarction, Angina, Valve Disease, and Cardiac Arrhythmias.  Written material given at graduation. Flowsheet Row Cardiac Rehab from 01/18/2021 in [facility] Cardiac and Pulmonary Rehab  Date 11/23/20  Educator SB  Instruction Review Code 1- Verbalizes Understanding       Medication  Safety: - Group verbal and visual instruction to review commonly prescribed medications for heart and lung disease. Reviews the medication, class of the drug, and side effects. Includes the steps to properly store meds and maintain the prescription regimen.  Written material given at graduation. Flowsheet Row Cardiac Rehab from 01/18/2021 in [facility] Cardiac and Pulmonary Rehab  Date 12/14/20  Educator SB  Instruction Review Code 1- Verbalizes Understanding       Intimacy: - Group verbal instruction through game format to discuss how heart and lung disease can affect sexual intimacy. Written material given at graduation.. Flowsheet Row Cardiac Rehab from 01/18/2021 in [facility] Cardiac and Pulmonary Rehab  Date 11/16/20  Educator AS  Instruction Review Code 1- Verbalizes Understanding       Know Your Numbers and Heart Failure: - Group verbal and visual instruction to discuss disease risk  factors for cardiac and pulmonary disease and treatment options.  Reviews associated critical values for Overweight/Obesity, Hypertension, Cholesterol, and Diabetes.  Discusses basics of heart failure: signs/symptoms and treatments.  Introduces Heart Failure Zone chart for action plan for heart failure.  Written material given at graduation. Flowsheet Row Cardiac Rehab from 01/18/2021 in [facility] Cardiac and Pulmonary Rehab  Date 12/21/20  Educator [facility]  Instruction Review Code 1- Verbalizes Understanding       Infection Prevention: - Provides verbal and written material to individual with discussion of infection control including proper hand washing and proper equipment cleaning during exercise session. Flowsheet Row Cardiac Rehab from 01/18/2021 in [facility] Cardiac and Pulmonary Rehab  Date 11/07/20  Educator [facility]  Instruction Review Code 1- Verbalizes Understanding       Falls Prevention: - Provides verbal and written material to individual with discussion of falls prevention and safety. Flowsheet Row Cardiac Rehab from 01/18/2021 in [facility] Cardiac and Pulmonary Rehab  Date 11/04/20  Educator SB  Instruction Review Code 1- Verbalizes Understanding       Other: -Provides group and verbal instruction on various topics (see comments)   Knowledge Questionnaire Score:  Knowledge Questionnaire Score - 01/20/21 0950       Knowledge Questionnaire Score   Post Score 25/26             Core Components/Risk Factors/Patient Goals at Admission:  Personal Goals and Risk Factors at Admission - 11/07/20 1041       Core Components/Risk Factors/Patient Goals on Admission    Weight Management Yes;Weight Loss    Intervention Weight Management: Provide education and appropriate resources to help participant work on and attain dietary goals.;Weight Management: Develop a combined nutrition and exercise program designed to reach desired caloric intake, while maintaining appropriate intake of nutrient  and fiber, sodium and fats, and appropriate energy expenditure required for the weight goal.;Weight Management/Obesity: Establish reasonable short term and long term weight goals.    Admit Weight 159 lb 6.4 oz (72.3 kg)    Goal Weight: Short Term 155 lb (70.3 kg)    Goal Weight: Long Term 150 lb (68 kg)    Expected Outcomes Short Term: Continue to assess and modify interventions until short term weight is achieved;Long Term: Adherence to nutrition and physical activity/exercise program aimed toward attainment of established weight goal;Understanding recommendations for meals to include 15-35% energy as protein, 25-35% energy from fat, 35-60% energy from carbohydrates, less than 200mg  of dietary cholesterol, 20-35 gm of total fiber daily;Weight Loss: Understanding of general recommendations for a balanced deficit meal plan, which promotes 1-2 lb weight loss per week and includes a negative  energy balance of [phone] kcal/d    Hypertension Yes    Intervention Provide education on lifestyle modifcations including regular physical activity/exercise, weight management, moderate sodium restriction and increased consumption of fresh fruit, vegetables, and low fat dairy, alcohol moderation, and smoking cessation.;Monitor prescription use compliance.    Expected Outcomes Short Term: Continued assessment and intervention until BP is < 140/67mm HG in hypertensive participants. < 130/27mm HG in hypertensive participants with diabetes, heart failure or chronic kidney disease.;Long Term: Maintenance of blood pressure at goal levels.    Lipids Yes    Intervention Provide education and support for participant on nutrition & aerobic/resistive exercise along with prescribed medications to achieve LDL 70mg , HDL >40mg .    Expected Outcomes Short Term: Participant states understanding of desired cholesterol values and is compliant with medications prescribed. Participant is following exercise prescription and nutrition  guidelines.;Long Term: Cholesterol controlled with medications as prescribed, with individualized exercise RX and with personalized nutrition plan. Value goals: LDL < 70mg , HDL > 40 mg.             Education:Diabetes - Individual verbal and written instruction to review signs/symptoms of diabetes, desired ranges of glucose level fasting, after meals and with exercise. Acknowledge that pre and post exercise glucose checks will be done for 3 sessions at entry of program.   Core Components/Risk Factors/Patient Goals Review:   Goals and Risk Factor Review     Row Name 12/02/20 0957 01/27/21 1003           Core Components/Risk Factors/Patient Goals Review   Personal Goals Review Weight Management/Obesity;Hypertension;Lipids Weight Management/Obesity      Review Damarea checks his blood pressure at home. He is monitoring it daily. He has lost 2 pounds since the start if the program. He wants to get down 5 more pounds. Esmeralda does not have any questions about his overall health and is ready to maintain his exercise independently.      Expected Outcomes Short: Lose 5 more pounds. Long: maintain weight independently Short: continue to exercise. Long: maintain exercise independently at home.               Core Components/Risk Factors/Patient Goals at Discharge (Final Review):   Goals and Risk Factor Review - 01/27/21 1003       Core Components/Risk Factors/Patient Goals Review   Personal Goals Review Weight Management/Obesity    Review Clancey does not have any questions about his overall health and is ready to maintain his exercise independently.    Expected Outcomes Short: continue to exercise. Long: maintain exercise independently at home.             ITP Comments:  ITP Comments     Row Name 11/04/20 1029 11/07/20 1034 11/16/20 0804 12/14/20 1018 01/04/21 1318   ITP Comments Virtual orientation call completed today. he has an appointment on Date: 11/07/2020 for EP eval and gym  Orientation.  Documentation of diagnosis can be found in [facility] Date: 09/26/2020. Completed 6MWT and gym orientation. Initial ITP created and sent for review to Dr. Emily Filbert, Medical Director. 30 Day review completed. Medical Director ITP review done, changes made as directed, and signed approval by Medical Director.  New to program 30 Day review completed. Medical Director ITP review done, changes made as directed, and signed approval by Medical Director. Lamberto is currently out for COVID exposure.    [place] Name 01/09/21 1633 01/11/21 0935 02/06/21 1107       ITP Comments Unable to get goals for  [place] this review as he is out due to [place]. 30 Day review completed. Medical Director ITP review done, changes made as directed, and signed approval by Medical Director. Damoney graduated today from  rehab with 35 sessions completed.  Details of the patient's exercise prescription and what He needs to do in order to continue the prescription and progress were discussed with patient.  Patient was given a copy of prescription and goals.  Patient verbalized understanding.  Jayant plans to continue to exercise by joining a local gym.              Comments: Discharge ITP

## 2021-02-06 NOTE — Progress Notes (Signed)
Daily Session Note  Patient Details  Name: James Peck MRN: [id number] Date of Birth: 20-Mar-1941 Referring Provider:   Flowsheet Row Cardiac Rehab from 11/07/2020 in [facility] Cardiac and Pulmonary Rehab  Referring Provider Bartholome Bill MD       Encounter Date: 02/06/2021  Check In:  Session Check In - 02/06/21 1104       Check-In   Supervising physician immediately available to respond to emergencies See telemetry face sheet for immediately available ER MD    Location [facility]-Cardiac & Pulmonary Rehab    Staff Present Heath Lark, RN, BSN, CCRP;Kristen Coble, RN,BC,MSN;Kelly Canon, BS, ACSM CEP, Exercise Physiologist    Virtual Visit No    Medication changes reported     No    Fall or balance concerns reported    No    Warm-up and Cool-down Performed on first and last piece of equipment    Resistance Training Performed Yes    VAD Patient? No    PAD/SET Patient? No      Pain Assessment   Currently in Pain? No/denies                Social History   Tobacco Use  Smoking Status Former   Packs/day: 1.00   Years: 40.00   Pack years: 40.00   Types: Cigarettes   Quit date: 2000   Years since quitting: 22.5  Smokeless Tobacco Never    Goals Met:  Independence with exercise equipment Exercise tolerated well Personal goals reviewed No report of cardiac concerns or symptoms  Goals Unmet:  Not Applicable  Comments:  James Peck graduated today from  rehab with 35 sessions completed.  Details of the patient's exercise prescription and what He needs to do in order to continue the prescription and progress were discussed with patient.  Patient was given a copy of prescription and goals.  Patient verbalized understanding.  James Peck plans to continue to exercise by joining a local gym.   Dr. Emily Filbert is Medical Director for [place].  Dr. Ottie Glazier is Medical Director for [facility] Pulmonary Rehabilitation.

## 2021-02-06 NOTE — Progress Notes (Signed)
Discharge Progress Report  Patient Details  Name: James Peck MRN: [id number] Date of Birth: 1941-04-05 Referring Provider:   Flowsheet Row Cardiac Rehab from 11/07/2020 in [facility] Cardiac and Pulmonary Rehab  Referring Provider Bartholome Bill MD        Number of Visits: 35  Reason for Discharge:  Patient reached a stable level of exercise. Patient independent in their exercise.  Diagnosis:  NSTEMI (non-ST elevation myocardial infarction) [facility])  Initial Exercise Prescription:  Initial Exercise Prescription - 11/07/20 1000       Date of Initial Exercise RX and Referring Provider   Date 11/07/20    Referring Provider Bartholome Bill MD      Treadmill   MPH 2.3    Grade 0    Minutes 15    METs 2.76      Recumbant Bike   Level 2    RPM 50    Watts 10    Minutes 15    METs 2.5      NuStep   Level 2    SPM 80    Minutes 15    METs 2.5      REL-XR   Level 1    Speed 50    Minutes 15    METs 2.5      Prescription Details   Frequency (times per week) 3    Duration Progress to 30 minutes of continuous aerobic without signs/symptoms of physical distress      Intensity   THRR 40-80% of Max Heartrate 97-126    Ratings of Perceived Exertion 11-13    Perceived Dyspnea 0-4      Progression   Progression Continue to progress workloads to maintain intensity without signs/symptoms of physical distress.      Resistance Training   Training Prescription Yes    Weight 4 lb    Reps 10-15             Discharge Exercise Prescription (Final Exercise Prescription Changes):  Exercise Prescription Changes - 01/31/21 1600       Response to Exercise   Blood Pressure (Admit) 126/64    Blood Pressure (Exit) 136/56    Heart Rate (Admit) 63 bpm    Heart Rate (Exercise) 105 bpm    Heart Rate (Exit) 78 bpm    Rating of Perceived Exertion (Exercise) 12    Symptoms none    Duration Continue with 30 min of aerobic exercise without signs/symptoms of physical distress.     Intensity THRR unchanged      Progression   Progression Continue to progress workloads to maintain intensity without signs/symptoms of physical distress.    Average METs 3.8      Resistance Training   Training Prescription Yes    Weight 4 lb    Reps 10-15      Interval Training   Interval Training No      Treadmill   MPH 2.7    Grade 1.5    Minutes 15    METs 3.63      NuStep   Level 4    Minutes 15    METs 3.1      Elliptical   Level 1    Speed 2.5    Minutes 15    METs 2.5      REL-XR   Level 3    Minutes 15      Biostep-RELP   Level 3    Minutes 15    METs 3  Home Exercise Plan   Plans to continue exercise at Home (comment)   walking, cross trainer   Frequency Add 2 additional days to program exercise sessions.    Initial Home Exercises Provided 12/02/20             Functional Capacity:  6 Minute Walk     Row Name 11/07/20 1034 01/16/21 1038       6 Minute Walk   Phase Initial Discharge    Distance 1215 feet 1540 feet    Distance % Change -- 26.7 %    Distance Feet Change -- 325 ft    Walk Time 6 minutes 6 minutes    # of Rest Breaks 0 0    MPH 2.3 2.9    METS 2.37 3.14    RPE 13 13    Perceived Dyspnea  2 --    VO2 Peak 8.29 11    Symptoms Yes (comment) No    Comments SOB, "wore wrong shoes today", shoes bothered his ankle --    Resting HR 68 bpm 63 bpm    Resting BP 122/64 138/62    Resting Oxygen Saturation  94 % 98 %    Exercise Oxygen Saturation  during 6 min walk 94 % 91 %    Max Ex. HR 103 bpm 92 bpm    Max Ex. BP 130/74 178/82    2 Minute Post BP 116/64 140/70            Goals reviewed with patient; copy given to patient.

## 2021-02-09 ENCOUNTER — Other Ambulatory Visit: Payer: Self-pay

## 2021-02-09 ENCOUNTER — Encounter: Payer: Self-pay | Admitting: Pulmonary Disease

## 2021-02-09 ENCOUNTER — Ambulatory Visit: Payer: PPO | Admitting: Pulmonary Disease

## 2021-02-09 VITALS — BP 104/60 | HR 64 | Temp 97.9°F | Ht 66.0 in | Wt 157.0 lb

## 2021-02-09 DIAGNOSIS — J449 Chronic obstructive pulmonary disease, unspecified: Secondary | ICD-10-CM | POA: Diagnosis not present

## 2021-02-09 DIAGNOSIS — J479 Bronchiectasis, uncomplicated: Secondary | ICD-10-CM | POA: Diagnosis not present

## 2021-02-09 DIAGNOSIS — J430 Unilateral pulmonary emphysema [MacLeod's syndrome]: Secondary | ICD-10-CM | POA: Diagnosis not present

## 2021-02-09 NOTE — Progress Notes (Signed)
Subjective:    Patient ID: James Peck, male    DOB: 12/09/1940, 80 y.o.   MRN: [id number] Chief Complaint  Patient presents with   Follow-up    Recent CT. C/o occ sob with exertion.    HPI Is a 80 year old former smoker who presents for follow-up on the issue of cough and shortness of breath.  This is a scheduled visit.  Last visit here was on 06/21/2020, saw Wyn Quaker, NP at that time.  He is on [employer].  Does have issues with tenacious secretions.  Most recent CT that showed that he has some retained secretions and small patch of bronchiectasis on the right lower lobe.  PFTs did show element of airways reversibility.  Patient continues to have occasional cough as noted with tenacious secretions.  No hemoptysis.  No fevers, chills or sweats.  He does have issues with dyspnea on exertion.  He just got his Anoro refilled.  Anoro does help with shortness of breath.  DATA: 04/18/2020 PFTs: FEV1 1.40 L or 57% predicted, FVC 2.48 L or 71% predicted, FEV1/FVC 57%.  There is significant bronchodilator response with 17% change in FEV1 postbronchodilator.  Moderate air trapping, mild diffusion capacity impairment, consistent with COPD/emphysema with reversible airways component. 09/12/2020 2D echo: LVEF 60 to 65%, no regional wall motion normalities.  Grade I DD. 09/12/2020 left heart cath:Insignificant coronary artery disease with patent stent proximal/mid RCA, 50% stenosis mid/distal LAD with muscle bridge ([place]). 12/27/2020 CT chest: Emphysema, lower lobe posterior bronchiectasis, not new volume of retained secretions in bronchus.  No lesions of concern.  Calcified atherosclerosis.  Review of Systems A 10 point review of systems was performed and it is as noted above otherwise negative.  Patient Active Problem List   Diagnosis Date Noted   Subclavian artery stenosis, left ([place]) 02/04/2021   Carotid stenosis 02/04/2021   Coronary artery disease    Chest pain    NSTEMI (non-ST  elevated myocardial infarction) ([place])    COPD exacerbation ([place])    Exposure to confirmed case of COVID-19 07/26/2020   Allergic rhinitis 04/12/2020   Emphysema, unspecified ([place]) 04/12/2020   Former smoker 04/12/2020   Lung nodules 03/27/2020   Renal lesion 03/27/2020   Abnormal abdominal CT scan 02/16/2020   Left carotid bruit 11/10/2019   Right rotator cuff tear 08/19/2019   AC (acromioclavicular) arthritis 07/08/2019   Cough 06/30/2019   Healthcare maintenance 08/28/2018   Leg cramps 04/27/2018   Hyperglycemia 12/22/2017   Degenerative arthritis of left knee 06/04/2017   Degenerative tear of meniscus of left knee 02/14/2015   Acid reflux 01/27/2015   Adenomatous colon polyp 01/27/2015   Allergic state 01/27/2015   Arteriosclerosis of coronary artery 01/27/2015   Lumbar radiculopathy 01/27/2015   CAD in native artery 01/07/2014   Benign essential HTN 10/21/2013   HLD (hyperlipidemia) 10/21/2013   Social History   Tobacco Use   Smoking status: Former    Packs/day: 1.00    Years: 40.00    Pack years: 40.00    Types: Cigarettes    Quit date: 2000    Years since quitting: 22.5   Smokeless tobacco: Never  Substance Use Topics   Alcohol use: No   Allergies  Allergen Reactions   Ace Inhibitors     Cough   Enalapril Cough   . Current Meds  Medication Sig   albuterol (VENTOLIN HFA) 108 (90 Base) MCG/ACT inhaler Inhale 1-2 puffs into the lungs every 6 (six) hours as needed for  wheezing or shortness of breath.   ANORO ELLIPTA 62.5-25 MCG/INH AEPB Inhale 1 puff into the lungs daily.   aspirin EC 81 MG tablet Take 81 mg by mouth daily.    calcium carbonate (OS-CAL - DOSED IN MG OF ELEMENTAL CALCIUM) 1250 (500 CA) MG tablet Take 1 tablet by mouth daily.    cetirizine (ZYRTEC) 10 MG tablet TAKE 1 TABLET BY MOUTH DAILY   chlorpheniramine (CHLOR-TRIMETON) 4 MG tablet Take 4 mg by mouth 2 (two) times daily as needed for allergies.   CINNAMON PO Take 1,200 mg by mouth 2 times  daily at 12 noon and 4 pm.   clopidogrel (PLAVIX) 75 MG tablet Take 1 tablet (75 mg total) by mouth daily.   dextromethorphan-guaiFENesin (MUCINEX DM) 30-600 MG 12hr tablet Take 1 tablet by mouth 2 (two) times daily.   diclofenac sodium (VOLTAREN) 1 % GEL Apply 2 g 2 (two) times daily topically. To affected joint.   famotidine (PEPCID) 20 MG tablet Take 1 tablet (20 mg total) by mouth daily.   fluticasone (FLONASE) 50 MCG/ACT nasal spray TAKE 2 SPRAYS INTO BOTH NOSTRILS DAILY   GLUCOSAMINE-CHONDROITIN PO Take 1 capsule by mouth daily.   Misc Natural Products (BLACK CHERRY CONCENTRATE PO) Take 1,000 mg daily by mouth.   nitroGLYCERIN (NITROSTAT) 0.4 MG SL tablet Place 1 tablet (0.4 mg total) under the tongue every 5 (five) minutes as needed for chest pain.   Omega-3 Fatty Acids (FISH OIL) 1000 MG CAPS Take 1,000 mg by mouth daily.    rosuvastatin (CRESTOR) 20 MG tablet Take 1 tablet (20 mg total) by mouth daily.   sildenafil (REVATIO) 20 MG tablet TAKE 1 TO 2 TABLETS BY MOUTH EVERY DAY AS NEEDED   telmisartan (MICARDIS) 20 MG tablet TAKE ONE TABLET (20 MG) BY MOUTH EVERY DAY   Immunization History  Administered Date(s) Administered   Fluad Quad(high Dose 65+) 04/09/2019   Influenza Split 05/11/2014   Influenza, High Dose Seasonal PF 04/23/2017, 04/29/2018, 04/09/2019, 05/05/2020   Influenza-Unspecified 04/25/2015, 04/23/2017   PFIZER(Purple Top)SARS-COV-2 Vaccination 08/28/2019, 09/18/2019   Tdap 03/11/2013       Objective:   Physical Exam BP 104/60 (BP Location: Left Arm, Cuff Size: Normal)   Pulse 64   Temp 97.9 F (36.6 C)   Ht 5\' 6"  (1.676 m)   Wt 157 lb (71.2 kg)   SpO2 96%   BMI 25.34 kg/m  GENERAL: Well-developed, well-nourished elderly gentleman, in no acute distress, fully ambulatory.  No conversational dyspnea. HEAD: Normocephalic, atraumatic.  EYES: Pupils equal, round, reactive to light.  No scleral icterus.  MOUTH: Nose/mouth/throat not examined due to masking  requirements for COVID 19. NECK: Supple. No thyromegaly. Trachea midline. No JVD.  No adenopathy. PULMONARY: Good air entry bilaterally.  Coarse, otherwise, no adventitious sounds. CARDIOVASCULAR: S1 and S2. Regular rate and rhythm.  No rubs, murmurs or gallops heard. ABDOMEN: Benign. MUSCULOSKELETAL: No joint deformity, no clubbing, no edema.  NEUROLOGIC: No focal deficit, no gait disturbance, speech is fluent. SKIN: Intact,warm,dry. PSYCH: Mood and behavior normal.  Representative images from CT chest performed 27 Dec 2020:  Retained secretions on the right mainstem.  Small area of bronchiectasis right lower lobe.  Assessment & Plan:     ICD-10-CM   1. Asthma-COPD overlap syndrome ([place])  J44.9 Flutter valve   Patient will benefit to switch from Anoro to Trelegy Ellipta He is to call us before his inhaler runs out so that we can call Trelegy in    2. Bronchiectasis  without complication ([place])  E45.4    Pulmonary hygiene Acapella flutter valve     Orders Placed This Encounter  Procedures   Flutter valve    Use as directed    Standing Status:   Future    Standing Expiration Date:   02/09/2022   Patient will benefit from addition of ICS to his current regimen.  He is to let us know when he is about to run out of his Anoro so we can switch his inhaler to Trelegy Ellipta 100/62.5/25, 1 inhalation daily.  Patient was also provided with an Acapella flutter valve and taught the proper use of the device.  We will see him in follow-up in 2 to 3 months time he is to contact us prior to that time should any new difficulties arise.   Renold Don, MD [facility]   *This note was dictated using voice recognition software/Dragon.  Despite best efforts to proofread, errors can occur which can change the meaning.  Any change was purely unintentional.

## 2021-02-09 NOTE — Patient Instructions (Addendum)
Continue your Anoro for now.  Let us know when you are about to run out of the Anoro so we can switch the inhaler  We have given you an Acapella flutter valve this is a valve that will help you clear mucus.  We will see him in follow-up in 2 to 3 months time .

## 2021-03-14 DIAGNOSIS — H6122 Impacted cerumen, left ear: Secondary | ICD-10-CM | POA: Diagnosis not present

## 2021-03-14 DIAGNOSIS — H93293 Other abnormal auditory perceptions, bilateral: Secondary | ICD-10-CM | POA: Diagnosis not present

## 2021-03-31 ENCOUNTER — Other Ambulatory Visit: Payer: Self-pay

## 2021-03-31 ENCOUNTER — Other Ambulatory Visit (INDEPENDENT_AMBULATORY_CARE_PROVIDER_SITE_OTHER): Payer: PPO

## 2021-03-31 DIAGNOSIS — R739 Hyperglycemia, unspecified: Secondary | ICD-10-CM

## 2021-03-31 DIAGNOSIS — E785 Hyperlipidemia, unspecified: Secondary | ICD-10-CM | POA: Diagnosis not present

## 2021-03-31 DIAGNOSIS — I10 Essential (primary) hypertension: Secondary | ICD-10-CM | POA: Diagnosis not present

## 2021-03-31 LAB — BASIC METABOLIC PANEL
BUN: 14 mg/dL (ref 6–23)
CO2: 25 mEq/L (ref 19–32)
Calcium: 9.6 mg/dL (ref 8.4–10.5)
Chloride: 101 mEq/L (ref 96–112)
Creatinine, Ser: 0.95 mg/dL (ref 0.40–1.50)
GFR: 76.11 mL/min (ref >60.00)
Glucose, Bld: 112 mg/dL — ABNORMAL HIGH (ref 70–99)
Potassium: 4.4 mEq/L (ref 3.5–5.1)
Sodium: 134 mEq/L — ABNORMAL LOW (ref 135–145)

## 2021-03-31 LAB — LIPID PANEL
Cholesterol: 123 mg/dL (ref 0–200)
HDL: 42.3 mg/dL (ref >39.00)
LDL Cholesterol: 68 mg/dL (ref 0–99)
NonHDL: 81.07
Total CHOL/HDL Ratio: 3
Triglycerides: 66 mg/dL (ref 0.0–149.0)
VLDL: 13.2 mg/dL (ref 0.0–40.0)

## 2021-03-31 LAB — HEPATIC FUNCTION PANEL
ALT: 18 U/L (ref 0–53)
AST: 19 U/L (ref 0–37)
Albumin: 4.3 g/dL (ref 3.5–5.2)
Alkaline Phosphatase: 55 U/L (ref 39–117)
Bilirubin, Direct: 0.1 mg/dL (ref 0.0–0.3)
Total Bilirubin: 0.7 mg/dL (ref 0.2–1.2)
Total Protein: 6.7 g/dL (ref 6.0–8.3)

## 2021-03-31 LAB — CBC WITH DIFFERENTIAL/PLATELET
Basophils Absolute: 0 10*3/uL (ref 0.0–0.1)
Basophils Relative: 0.5 % (ref 0.0–3.0)
Eosinophils Absolute: 0.2 10*3/uL (ref 0.0–0.7)
Eosinophils Relative: 4.3 % (ref 0.0–5.0)
HCT: 43 % (ref 39.0–52.0)
Hemoglobin: 14.6 g/dL (ref 13.0–17.0)
Lymphocytes Relative: 32.4 % (ref 12.0–46.0)
Lymphs Abs: 1.8 10*3/uL (ref 0.7–4.0)
MCHC: 33.9 g/dL (ref 30.0–36.0)
MCV: 91.4 fl (ref 78.0–100.0)
Monocytes Absolute: 0.7 10*3/uL (ref 0.1–1.0)
Monocytes Relative: 12.3 % — ABNORMAL HIGH (ref 3.0–12.0)
Neutro Abs: 2.9 10*3/uL (ref 1.4–7.7)
Neutrophils Relative %: 50.5 % (ref 43.0–77.0)
Platelets: 299 10*3/uL (ref 150.0–400.0)
RBC: 4.7 Mil/uL (ref 4.22–5.81)
RDW: 13.8 % (ref 11.5–15.5)
WBC: 5.7 10*3/uL (ref 4.0–10.5)

## 2021-03-31 LAB — HEMOGLOBIN A1C: Hgb A1c MFr Bld: 6.2 % (ref 4.6–6.5)

## 2021-04-04 ENCOUNTER — Other Ambulatory Visit: Payer: Self-pay

## 2021-04-04 ENCOUNTER — Encounter: Payer: Self-pay | Admitting: Internal Medicine

## 2021-04-04 ENCOUNTER — Ambulatory Visit (INDEPENDENT_AMBULATORY_CARE_PROVIDER_SITE_OTHER): Payer: PPO | Admitting: Internal Medicine

## 2021-04-04 VITALS — BP 128/62 | HR 74 | Temp 96.6°F | Resp 16 | Ht 66.0 in | Wt 156.4 lb

## 2021-04-04 DIAGNOSIS — E785 Hyperlipidemia, unspecified: Secondary | ICD-10-CM

## 2021-04-04 DIAGNOSIS — I771 Stricture of artery: Secondary | ICD-10-CM | POA: Diagnosis not present

## 2021-04-04 DIAGNOSIS — R739 Hyperglycemia, unspecified: Secondary | ICD-10-CM

## 2021-04-04 DIAGNOSIS — Z0000 Encounter for general adult medical examination without abnormal findings: Secondary | ICD-10-CM | POA: Diagnosis not present

## 2021-04-04 DIAGNOSIS — Z23 Encounter for immunization: Secondary | ICD-10-CM

## 2021-04-04 DIAGNOSIS — E871 Hypo-osmolality and hyponatremia: Secondary | ICD-10-CM

## 2021-04-04 DIAGNOSIS — I10 Essential (primary) hypertension: Secondary | ICD-10-CM | POA: Diagnosis not present

## 2021-04-04 DIAGNOSIS — I779 Disorder of arteries and arterioles, unspecified: Secondary | ICD-10-CM

## 2021-04-04 DIAGNOSIS — I2510 Atherosclerotic heart disease of native coronary artery without angina pectoris: Secondary | ICD-10-CM | POA: Diagnosis not present

## 2021-04-04 DIAGNOSIS — N289 Disorder of kidney and ureter, unspecified: Secondary | ICD-10-CM

## 2021-04-04 DIAGNOSIS — E782 Mixed hyperlipidemia: Secondary | ICD-10-CM | POA: Diagnosis not present

## 2021-04-04 DIAGNOSIS — J439 Emphysema, unspecified: Secondary | ICD-10-CM | POA: Diagnosis not present

## 2021-04-04 DIAGNOSIS — I25119 Atherosclerotic heart disease of native coronary artery with unspecified angina pectoris: Secondary | ICD-10-CM | POA: Diagnosis not present

## 2021-04-04 NOTE — Progress Notes (Signed)
Patient ID: James Peck, male   DOB: 10/10/1940, 80 y.o.   MRN: [id number]   Subjective:    Patient ID: James Peck, male    DOB: Jun 19, 1941, 80 y.o.   MRN: [id number]  This visit occurred during the SARS-CoV-2 public health emergency.  Safety protocols were in place, including screening questions prior to the visit, additional usage of staff PPE, and extensive cleaning of exam room while observing appropriate contact time as indicated for disinfecting solutions.   Patient here for physical exam.  Chief Complaint  Patient presents with   Annual Exam   .   HPI Reports he is doing relatively well. Staying active.  No chest pain or sob with increased activity or exertion.  Breathing stable.  Sees pulmonary.  Recommended anoro and flutter valve - last visit.  No increased cough or congestion.  No abdominal pain or bowel change reported.  Saw Dr Delana Meyer 01/2021.  Stable.  Recommended f/u in one year.  Due to see Dr Ubaldo Glassing this pam.  Appears to have cyst (presumed ganglion) - left hand (fifth).  No pain. Discussed labs.    Past Medical History:  Diagnosis Date   Allergy    Arthritis    Coronary artery disease    GERD (gastroesophageal reflux disease)    History of hiatal hernia    Hypercholesteremia    Hypertension    Past Surgical History:  Procedure Laterality Date   CARDIAC CATHETERIZATION     cardiac stents     CATARACT EXTRACTION W/PHACO Right 09/10/2017   Procedure: CATARACT EXTRACTION PHACO AND INTRAOCULAR LENS PLACEMENT ([place]);  Surgeon: Birder Robson, MD;  Location: [facility] ORS;  Service: Ophthalmology;  Laterality: Right;  [place] 01:01.7 AP% 15.3 CDE 9.38 Fluid pack lot # [id number]   CATARACT EXTRACTION W/PHACO Left 10/02/2017   Procedure: CATARACT EXTRACTION PHACO AND INTRAOCULAR LENS PLACEMENT ([facility]);  Surgeon: Birder Robson, MD;  Location: [facility] ORS;  Service: Ophthalmology;  Laterality: Left;  [place] 00:42.3 AP% 16.2 CDE 6.85 Fluid Pack Lot # [id number]   COLONOSCOPY WITH PROPOFOL  N/A 06/28/2017   Procedure: COLONOSCOPY WITH PROPOFOL;  Surgeon: Manya Silvas, MD;  Location[facility] END[facility]COPY;  Service: Endoscopy;  Laterality: N/A;   CORONARY ANGIOPLASTY     STENTS X 2   KNEE ARTHROSCOPY     LEFT HEART CATH AND CORONARY ANGIOGRAPHY N/A 09/12/2020   Procedure: LEFT HEART CATH AND CORONARY ANGIOGRAPHY;  Surgeon: Isaias Cowman, MD;  Location: [place] [facility];  Service: Cardiovascular;  Laterality: N/A;   Family History  Problem Relation Age of Onset   Heart disease Mother    Diabetes Mother    Heart disease Father    Diabetes Father    Alzheimer's disease Brother    Stroke Brother    Social History   Socioeconomic History   Marital status: Married    Spouse name: Not on file   Number of children: Not on file   Years of education: Not on file   Highest education level: Not on file  Occupational History   Not on file  Tobacco Use   Smoking status: Former    Packs/day: 1.00    Years: 40.00    Pack years: 40.00    Types: Cigarettes    Quit date: 2000    Years since quitting: 22.7   Smokeless tobacco: Never  Vaping Use   Vaping Use: Never used  Substance and Sexual Activity   Alcohol use: No   Drug use: No  Sexual activity: Not on file  Other Topics Concern   Not on file  Social History Narrative   Not on file   Social Determinants of Health   Financial Resource Strain: Low Risk    Difficulty of Paying Living Expenses: Not hard at all  Food Insecurity: No Food Insecurity   Worried About Running Out of Food in the Last Year: Never true   [place] in the Last Year: Never true  Transportation Needs: No Transportation Needs   Lack of Transportation (Medical): No   Lack of Transportation (Non-Medical): No  Physical Activity: Sufficiently Active   Days of Exercise per Week: 5 days   Minutes of Exercise per Session: 30 min  Stress: No Stress Concern Present   Feeling of Stress : Not at all  Social Connections: Unknown    Frequency of Communication with Friends and Family: Not on file   Frequency of Social Gatherings with Friends and Family: Not on file   Attends Religious Services: Not on file   Active Member of Clubs or Organizations: Not on file   Attends Archivist Meetings: Not on file   Marital Status: Married    Review of Systems  Constitutional:  Negative for appetite change and unexpected weight change.  HENT:  Negative for congestion, sinus pressure and sore throat.   Eyes:  Negative for pain and visual disturbance.  Respiratory:  Negative for cough, chest tightness and shortness of breath.   Cardiovascular:  Negative for chest pain, palpitations and leg swelling.  Gastrointestinal:  Negative for abdominal pain, diarrhea, nausea and vomiting.  Genitourinary:  Negative for difficulty urinating and dysuria.  Musculoskeletal:  Negative for joint swelling and myalgias.  Skin:  Negative for color change and rash.  Neurological:  Negative for dizziness, light-headedness and headaches.  Hematological:  Negative for adenopathy. Does not bruise/bleed easily.  Psychiatric/Behavioral:  Negative for agitation and dysphoric mood.       Objective:     BP 128/62   Pulse 74   Temp (!) 96.6 F (35.9 C)   Resp 16   Ht '5\' 6"'  (1.676 m)   Wt 156 lb 6.4 oz (70.9 kg)   SpO2 97%   BMI 25.24 kg/m  Wt Readings from Last 3 Encounters:  04/04/21 156 lb 6.4 oz (70.9 kg)  02/09/21 157 lb (71.2 kg)  02/02/21 157 lb (71.2 kg)    Physical Exam Constitutional:      General: He is not in acute distress.    Appearance: Normal appearance. He is well-developed.  HENT:     Head: Normocephalic and atraumatic.     Right Ear: External ear normal.     Left Ear: External ear normal.  Eyes:     General: No scleral icterus.       Right eye: No discharge.        Left eye: No discharge.     Conjunctiva/sclera: Conjunctivae normal.  Neck:     Thyroid: No thyromegaly.  Cardiovascular:     Rate and Rhythm:  Normal rate and regular rhythm.  Pulmonary:     Effort: No respiratory distress.     Breath sounds: Normal breath sounds. No wheezing.  Abdominal:     General: Bowel sounds are normal.     Palpations: Abdomen is soft.     Tenderness: There is no abdominal tenderness.  Musculoskeletal:        General: No swelling or tenderness.     Cervical back: Neck supple.  No tenderness.  Lymphadenopathy:     Cervical: No cervical adenopathy.  Skin:    Findings: No erythema or rash.  Neurological:     Mental Status: He is alert and oriented to person, place, and time.  Psychiatric:        Mood and Affect: Mood normal.        Behavior: Behavior normal.     Outpatient Encounter Medications as of 04/04/2021  Medication Sig   albuterol (VENTOLIN HFA) 108 (90 Base) MCG/ACT inhaler Inhale 1-2 puffs into the lungs every 6 (six) hours as needed for wheezing or shortness of breath.   ANORO ELLIPTA 62.5-25 MCG/INH AEPB Inhale 1 puff into the lungs daily.   aspirin EC 81 MG tablet Take 81 mg by mouth daily.    calcium carbonate (OS-CAL - DOSED IN MG OF ELEMENTAL CALCIUM) 1250 (500 CA) MG tablet Take 1 tablet by mouth daily.    cetirizine (ZYRTEC) 10 MG tablet TAKE 1 TABLET BY MOUTH DAILY   chlorpheniramine (CHLOR-TRIMETON) 4 MG tablet Take 4 mg by mouth 2 (two) times daily as needed for allergies.   CINNAMON PO Take 1,200 mg by mouth 2 times daily at 12 noon and 4 pm.   clopidogrel (PLAVIX) 75 MG tablet Take 1 tablet (75 mg total) by mouth daily.   dextromethorphan-guaiFENesin (MUCINEX DM) 30-600 MG 12hr tablet Take 1 tablet by mouth 2 (two) times daily.   diclofenac sodium (VOLTAREN) 1 % GEL Apply 2 g 2 (two) times daily topically. To affected joint.   famotidine (PEPCID) 20 MG tablet Take 1 tablet (20 mg total) by mouth daily.   fluticasone (FLONASE) 50 MCG/ACT nasal spray TAKE 2 SPRAYS INTO [place] NOSTRILS DAILY   GLUCOSAMINE-CHONDROITIN PO Take 1 capsule by mouth daily.   Misc Natural Products (BLACK  CHERRY CONCENTRATE PO) Take 1,000 mg daily by mouth.   nitroGLYCERIN (NITROSTAT) 0.4 MG SL tablet Place 1 tablet (0.4 mg total) under the tongue every 5 (five) minutes as needed for chest pain.   Omega-3 Fatty Acids (FISH OIL) 1000 MG CAPS Take 1,000 mg by mouth daily.    rosuvastatin (CRESTOR) 20 MG tablet Take 1 tablet (20 mg total) by mouth daily.   sildenafil (REVATIO) 20 MG tablet TAKE 1 TO 2 TABLETS BY MOUTH EVERY DAY AS NEEDED   telmisartan (MICARDIS) 20 MG tablet TAKE ONE TABLET (20 MG) BY MOUTH EVERY DAY   No facility-administered encounter medications on file as of 04/04/2021.     Lab Results  Component Value Date   WBC 5.7 03/31/2021   HGB 14.6 03/31/2021   HCT 43.0 03/31/2021   PLT 299.0 03/31/2021   GLUCOSE 112 (H) 03/31/2021   CHOL 123 03/31/2021   TRIG 66.0 03/31/2021   HDL 42.30 03/31/2021   LDLCALC 68 03/31/2021   ALT 18 03/31/2021   AST 19 03/31/2021   NA 134 (L) 03/31/2021   K 4.4 03/31/2021   CL 101 03/31/2021   CREATININE 0.95 03/31/2021   BUN 14 03/31/2021   CO2 25 03/31/2021   TSH 2.39 03/15/2020   PSA 1.27 11/28/2020   INR 1.0 09/11/2020   HGBA1C 6.2 03/31/2021    CT Abdomen Wo Contrast  Result Date: 12/27/2020 CLINICAL DATA:  Follow-up 11 mm renal lesion, CT performed on January 15, 2020. EXAM: CT ABDOMEN WITHOUT CONTRAST TECHNIQUE: Multidetector CT imaging of the abdomen was performed following the standard protocol without IV contrast. COMPARISON:  Comparison made with prior CT of the abdomen and pelvis from January 15, 2020 and with CT of the chest also acquired on Dec 27, 2020. FINDINGS: Lower chest: Pulmonary emphysema. Signs of pleural and parenchymal scarring at the RIGHT lung base. Peripheral consolidated area measuring 1.6 x 1.2 cm is unchanged dating back to June of 2021. Linear areas and mild bronchiectasis elsewhere at the RIGHT lung base. Areas of mucoid impaction and bronchial subtending the dependent RIGHT chest as before. Hepatobiliary: Liver with  smooth contour. No pericholecystic stranding. Pancreas: No signs of peripancreatic stranding or gross ductal dilation. Spleen: Normal spleen. Adrenals/Urinary Tract: Adrenal glands are normal. The area of low attenuation in the interpolar RIGHT kidney with density in the 24 Hounsfield unit range previously shown to have a density on a contrasted study of less than 30 Hounsfield units, not as well seen given slightly dense features as on previous imaging. No nephrolithiasis. No hydronephrosis. Mild perinephric stranding is a stable finding. Stomach/Bowel: No acute gastrointestinal process to the extent evaluated on abdominal CT. Vascular/Lymphatic: Aortic atherosclerosis. No aneurysmal dilation of the abdominal aorta. No abdominal lymphadenopathy. Other: No ascites. Musculoskeletal: No acute musculoskeletal process. Spinal degenerative changes. IMPRESSION: 1. Area of low attenuation in the upper-interpolar RIGHT kidney, anterior parenchyma seen on the prior study with density values less than 30 Hounsfield units on venous phase, under new Bosniak criteria could be classified as Bosniak category II compatible with mildly proteinaceous or hemorrhagic cyst. This previously measured approximately 1 cm but is not well seen on the current study. 2. Area of presumed post infectious scarring not changed over nearly 1 year interval since the study of 2021. Persistent mucoid impaction and peripheral consolidation. 3. Aortic atherosclerosis. Electronically Signed   By: Zetta Bills M.D.   On: 12/27/2020 09:31   CT Chest Wo Contrast  Result Date: 12/27/2020 CLINICAL DATA:  80 year old male with bronchial wall thickening and distal airway impaction on CT Abdomen and Pelvis last year. Intermittent congestion and productive cough. Former smoker. EXAM: CT CHEST WITHOUT CONTRAST TECHNIQUE: Multidetector CT imaging of the chest was performed following the standard protocol without IV contrast. COMPARISON:  CT Abdomen and Pelvis  today reported separately. CT Abdomen and Pelvis 01/15/2020. Chest radiographs 09/11/2020. FINDINGS: Cardiovascular: Bulky calcified plaque throughout the proximal left subclavian artery which is likely occluded (series 2, image 37). Calcified aortic atherosclerosis. Series 2, image 82) severe calcified coronary artery atherosclerosis (. No cardiomegaly or pericardial effusion. Vascular patency is not evaluated in the absence of IV contrast. Mediastinum/Nodes: Negative.  No lymphadenopathy. Lungs/Pleura: Widespread centrilobular emphysema. Upper lobe subpleural scarring and early honeycombing in some areas (series 3, image 42). Retained secretions in the right mainstem bronchus. The trachea and carina are otherwise patent. The other major airways are patent. In the posterior basal segment of the right lower lobe there is chronic bronchiectasis and bronchial wall thickening with an unchanged somewhat wedge-shaped area of distal airspace disease in the costophrenic angle (series 3, image 126). No superimposed pleural effusion. Mild contralateral left costophrenic angle scarring. No suspicious or new pulmonary opacity. Upper Abdomen: Abdomen is reported separately today. Musculoskeletal: No acute osseous abnormality identified. IMPRESSION: 1. Chronic lung disease with Emphysema (ICD10-J43.9). Stable right lower lobe posterior basal segment Bronchiectasis and scarring since last year. Small volume retained secretions in the right mainstem bronchus. But no new or progressed pulmonary abnormality. 2. Bulky calcified atherosclerosis, including probable Chronic Occlusion of the proximal Left Subclavian Artery, which would predispose to Subclavian Steal Syndrome. Advanced calcified coronary artery disease, Aortic atherosclerosis (ICD10-I70.0). Electronically Signed   By: Genevie Ann  M.D.   On: 12/27/2020 09:25       Assessment & Plan:   Problem List Items Addressed This Visit     Benign essential HTN    Continue  micardis.  Blood pressure check by me:  128/62 and relatively equal in Peck arms. .  Follow pressures.  Follow metabolic panel.       Carotid artery disease (HCC)    Carotid ultrasound <40%.  Saw [facility].  Recommended f/u in 12 months.       Emphysema, unspecified ([place])    Continue anoro.  Flutter valve.  Breathing stable.       Healthcare maintenance    Physical today 04/04/21.  Colonoscopy 2018.  Had recommended f/u in 3 years.  Discuss f/u colonoscopy.       HLD (hyperlipidemia)    Continue simvastatin.  Low cholesterol diet and exercise.  Follow lipid panel and liver function tests.        Relevant Orders   Basic metabolic panel   Lipid panel   Hepatic function panel   Hyperglycemia    Low carb diet and exercise. Follow met b and a1c.   Lab Results  Component Value Date   HGBA1C 6.2 03/31/2021       Relevant Orders   Hemoglobin A1c   Hyponatremia    Slightly decreased sodium on lab check.  Recheck sodium to confirm stable/normal.       Relevant Orders   Sodium   Renal lesion    Per 12/27/20 urology phone message - felt to be benign cyst.  No further testing warranted.        Subclavian artery stenosis, left (HCC)    No arm pain.  Continue antiplatelet therapy.  Saw [facility] 01/2021.  Recommended f/u in one year.       Other Visit Diagnoses     Routine general medical examination at a health care facility    -  Primary   Need for immunization against influenza       Relevant Orders   Flu Vaccine QUAD High Dose(Fluad) (Completed)        Einar Pheasant, MD

## 2021-04-09 ENCOUNTER — Encounter: Payer: Self-pay | Admitting: Internal Medicine

## 2021-04-09 DIAGNOSIS — E871 Hypo-osmolality and hyponatremia: Secondary | ICD-10-CM | POA: Insufficient documentation

## 2021-04-09 NOTE — Assessment & Plan Note (Signed)
Slightly decreased sodium on lab check.  Recheck sodium to confirm stable/normal.

## 2021-04-09 NOTE — Assessment & Plan Note (Signed)
Carotid ultrasound <40%.  Saw [facility].  Recommended f/u in 12 months.  

## 2021-04-09 NOTE — Assessment & Plan Note (Signed)
Continue micardis.  Blood pressure check by me:  128/62 and relatively equal in both arms. .  Follow pressures.  Follow metabolic panel.

## 2021-04-09 NOTE — Assessment & Plan Note (Signed)
Physical today 04/04/21.  Colonoscopy 2018.  Had recommended f/u in 3 years.  Discuss f/u colonoscopy.

## 2021-04-09 NOTE — Assessment & Plan Note (Signed)
Continue simvastatin.  Low cholesterol diet and exercise.  Follow lipid panel and liver function tests.   

## 2021-04-09 NOTE — Assessment & Plan Note (Signed)
Per 12/27/20 urology phone message - felt to be benign cyst.  No further testing warranted.

## 2021-04-09 NOTE — Assessment & Plan Note (Signed)
No arm pain.  Continue antiplatelet therapy.  Saw [facility] 01/2021.  Recommended f/u in one year.  

## 2021-04-09 NOTE — Assessment & Plan Note (Signed)
Low carb diet and exercise. Follow met b and a1c.   Lab Results  Component Value Date   HGBA1C 6.2 03/31/2021

## 2021-04-09 NOTE — Assessment & Plan Note (Signed)
Continue anoro.  Flutter valve.  Breathing stable.

## 2021-04-13 ENCOUNTER — Other Ambulatory Visit: Payer: Self-pay

## 2021-04-13 ENCOUNTER — Ambulatory Visit: Payer: PPO | Admitting: Pulmonary Disease

## 2021-04-13 ENCOUNTER — Encounter: Payer: Self-pay | Admitting: Pulmonary Disease

## 2021-04-13 VITALS — BP 130/60 | HR 68 | Temp 96.9°F | Ht 66.0 in | Wt 160.2 lb

## 2021-04-13 DIAGNOSIS — Z87891 Personal history of nicotine dependence: Secondary | ICD-10-CM | POA: Diagnosis not present

## 2021-04-13 DIAGNOSIS — J449 Chronic obstructive pulmonary disease, unspecified: Secondary | ICD-10-CM | POA: Diagnosis not present

## 2021-04-13 DIAGNOSIS — J479 Bronchiectasis, uncomplicated: Secondary | ICD-10-CM

## 2021-04-13 MED ORDER — TRELEGY ELLIPTA 100-62.5-25 MCG/INH IN AEPB: 1.0000 | INHALATION_SPRAY | Freq: Every day | RESPIRATORY_TRACT | 0 refills | Status: DC

## 2021-04-13 NOTE — Patient Instructions (Signed)
We are giving you samples of Trelegy Ellipta to substitute for the Anoro.  This is basically Anoro with an extra ingredient in it.  This should help with the areas of mucous retention that you are having.  You also have a component of asthma and this will be a better medication for this.  Let us know how you do with this inhaler.  We are giving you a month supply so call us when you start second box if it is working for you then we will call in the prescription for you.  We will see you in follow-up in 2 to 3 months time call sooner should any new problems arise

## 2021-04-13 NOTE — Progress Notes (Signed)
Subjective:    Patient ID: James Peck, male    DOB: 07-20-1941, 80 y.o.   MRN: [id number] Chief Complaint  Patient presents with   Follow-up    Pt states no concerns.    HPI Patient is a 80 year old former smoker who follows up on the issue of cough and shortness of breath.  This is a scheduled visit.  Last visit was 09 February 2021.  The patient has known COPD on the basis of emphysema, mild bronchiectasis and reversible airways component.  He is currently maintained on Anoro Ellipta.  He notes that this helps with his shortness of breath however he still has difficulties with tenacious sputum.  Recall that prior CT scan of the chest performed in May did show retained secretions.  At his last visit consideration to adding ICS was given however the patient had just gotten his Anoro filled and wanted to wait on this.  He has not had any other major issues since his last visit.  Continues to participate in activities of daily living with no limitation.  Dyspnea only occurs on heavy exertion.  He does not endorse any new symptoms.  Overall he feels well and looks well.   DATA: 04/18/2020 PFTs: FEV1 1.40 L or 57% predicted, FVC 2.48 L or 71% predicted, FEV1/FVC 57%.  There is significant bronchodilator response with 17% change in FEV1 postbronchodilator.  Moderate air trapping, mild diffusion capacity impairment, consistent with COPD/emphysema with reversible airways component. 09/12/2020 2D echo: LVEF 60 to 65%, no regional wall motion normalities.  Grade I DD. 09/12/2020 left heart cath:Insignificant coronary artery disease with patent stent proximal/mid RCA, 50% stenosis mid/distal LAD with muscle bridge (Paraschos). 12/27/2020 CT chest: Emphysema, lower lobe posterior bronchiectasis, not new volume of retained secretions in bronchus.  No lesions of concern.  Calcified atherosclerosis.  Review of Systems A 10 point review of systems was performed and it is as noted above otherwise  negative.  Patient Active Problem List   Diagnosis Date Noted   Hyponatremia 04/09/2021   Subclavian artery stenosis, left ([place]) 02/04/2021   Carotid artery disease ([place]) 02/04/2021   Coronary artery disease    Chest pain    NSTEMI (non-ST elevated myocardial infarction) ([place])    COPD exacerbation ([place])    Exposure to confirmed case of COVID-19 07/26/2020   Allergic rhinitis 04/12/2020   Emphysema, unspecified ([place]) 04/12/2020   Former smoker 04/12/2020   Lung nodules 03/27/2020   Renal lesion 03/27/2020   Abnormal abdominal CT scan 02/16/2020   Left carotid bruit 11/10/2019   Right rotator cuff tear 08/19/2019   AC (acromioclavicular) arthritis 07/08/2019   Cough 06/30/2019   Healthcare maintenance 08/28/2018   Leg cramps 04/27/2018   Hyperglycemia 12/22/2017   Degenerative arthritis of left knee 06/04/2017   Degenerative tear of meniscus of left knee 02/14/2015   Acid reflux 01/27/2015   Adenomatous colon polyp 01/27/2015   Allergic state 01/27/2015   Arteriosclerosis of coronary artery 01/27/2015   Lumbar radiculopathy 01/27/2015   CAD in native artery 01/07/2014   Benign essential HTN 10/21/2013   HLD (hyperlipidemia) 10/21/2013   Social History   Tobacco Use   Smoking status: Former    Packs/day: 1.00    Years: 40.00    Pack years: 40.00    Types: Cigarettes    Quit date: 2000    Years since quitting: 22.7   Smokeless tobacco: Never  Substance Use Topics   Alcohol use: No   Allergies  Allergen Reactions  Ace Inhibitors     Cough   Enalapril Cough   Current Meds  Medication Sig   albuterol (VENTOLIN HFA) 108 (90 Base) MCG/ACT inhaler Inhale 1-2 puffs into the lungs every 6 (six) hours as needed for wheezing or shortness of breath.   ANORO ELLIPTA 62.5-25 MCG/INH AEPB Inhale 1 puff into the lungs daily.   aspirin EC 81 MG tablet Take 81 mg by mouth daily.    calcium carbonate (OS-CAL - DOSED IN MG OF ELEMENTAL CALCIUM) 1250 (500 CA) MG tablet Take 1  tablet by mouth daily.    cetirizine (ZYRTEC) 10 MG tablet TAKE 1 TABLET BY MOUTH DAILY   chlorpheniramine (CHLOR-TRIMETON) 4 MG tablet Take 4 mg by mouth 2 (two) times daily as needed for allergies.   CINNAMON PO Take 1,200 mg by mouth 2 times daily at 12 noon and 4 pm.   clopidogrel (PLAVIX) 75 MG tablet Take 1 tablet (75 mg total) by mouth daily.   dextromethorphan-guaiFENesin (MUCINEX DM) 30-600 MG 12hr tablet Take 1 tablet by mouth 2 (two) times daily.   diclofenac sodium (VOLTAREN) 1 % GEL Apply 2 g 2 (two) times daily topically. To affected joint.   famotidine (PEPCID) 20 MG tablet Take 1 tablet (20 mg total) by mouth daily.   fluticasone (FLONASE) 50 MCG/ACT nasal spray TAKE 2 SPRAYS INTO BOTH NOSTRILS DAILY   GLUCOSAMINE-CHONDROITIN PO Take 1 capsule by mouth daily.   Misc Natural Products (BLACK CHERRY CONCENTRATE PO) Take 1,000 mg daily by mouth.   nitroGLYCERIN (NITROSTAT) 0.4 MG SL tablet Place 1 tablet (0.4 mg total) under the tongue every 5 (five) minutes as needed for chest pain.   Omega-3 Fatty Acids (FISH OIL) 1000 MG CAPS Take 1,000 mg by mouth daily.    rosuvastatin (CRESTOR) 20 MG tablet Take 1 tablet (20 mg total) by mouth daily.   sildenafil (REVATIO) 20 MG tablet TAKE 1 TO 2 TABLETS BY MOUTH EVERY DAY AS NEEDED   telmisartan (MICARDIS) 20 MG tablet TAKE ONE TABLET (20 MG) BY MOUTH EVERY DAY   Immunization History  Administered Date(s) Administered   Fluad Quad(high Dose 65+) 04/09/2019, 04/04/2021   Influenza Split 05/11/2014   Influenza, High Dose Seasonal PF 04/23/2017, 04/29/2018, 04/09/2019, 05/05/2020   Influenza-Unspecified 04/25/2015, 04/23/2017   PFIZER(Purple Top)SARS-COV-2 Vaccination 08/28/2019, 09/18/2019   Tdap 03/11/2013       Objective:   Physical Exam BP 130/60 (BP Location: Right Arm, Patient Position: Sitting, Cuff Size: Normal)   Pulse 68   Temp (!) 96.9 F (36.1 C) (Temporal)   Ht '5\' 6"'$  (1.676 m)   Wt 160 lb 3.2 oz (72.7 kg)   SpO2 98%    BMI 25.86 kg/m  GENERAL: Well-developed, well-nourished elderly gentleman, in no acute distress, fully ambulatory.  No conversational dyspnea. HEAD: Normocephalic, atraumatic.  EYES: Pupils equal, round, reactive to light.  No scleral icterus.  MOUTH: Nose/mouth/throat not examined due to masking requirements for COVID 19. NECK: Supple. No thyromegaly. Trachea midline. No JVD.  No adenopathy. PULMONARY: Good air entry bilaterally.  Coarse, with few rhonchi otherwise, no other adventitious sounds. CARDIOVASCULAR: S1 and S2. Regular rate and rhythm.  No rubs, murmurs or gallops heard. ABDOMEN: Benign. MUSCULOSKELETAL: No joint deformity, no clubbing, no edema.  NEUROLOGIC: No focal deficit, no gait disturbance, speech is fluent. SKIN: Intact,warm,dry. PSYCH: Mood and behavior normal.   Representative images from CT chest performed 27 Dec 2020 we reviewed these images again with the patient today: Retained secretions on the right mainstem.  Small area of bronchiectasis right lower lobe.     Assessment & Plan:     ICD-10-CM   1. Asthma-COPD overlap syndrome ([place])  J44.9    Patient will benefit from addition of ICS to his regimen Will switch to Trelegy Ellipta 100/62.5/25 1 puff daily Follow-up 2 to 3 months time    2. Bronchiectasis without complication ([place])  [id number]    Continue pulmonary hygiene Addition of ICS may help with tenacious secretions    3. Former smoker  Z87.891    No evidence of relapse     Meds ordered this encounter  Medications   Fluticasone-Umeclidin-Vilant (TRELEGY ELLIPTA) 100-62.5-25 MCG/INH AEPB    Sig: Inhale 1 puff into the lungs daily.    Dispense:  60 each    Refill:  0    Order Specific Question:   Lot Number?    Answer:   [id number]    Order Specific Question:   Expiration Date?    Answer:   08/30/2022    Order Specific Question:   Quantity    Answer:   2   We will switch to Trelegy Ellipta today.  He is to let us know how he is doing with this  inhaler.  Samples were provided for him today.  We will see the patient in follow-up in 2 to 3's time.  He is to contact us prior to that time should any new difficulties arise.   Renold Don, MD Advanced Bronchoscopy PCCM [facility]    *This note was dictated using voice recognition software/Dragon.  Despite best efforts to proofread, errors can occur which can change the meaning.  Any change was purely unintentional.

## 2021-04-17 ENCOUNTER — Other Ambulatory Visit: Payer: Self-pay | Admitting: Internal Medicine

## 2021-04-27 ENCOUNTER — Telehealth: Payer: Self-pay | Admitting: Pulmonary Disease

## 2021-04-27 MED ORDER — TRELEGY ELLIPTA 100-62.5-25 MCG/INH IN AEPB: 1.0000 | INHALATION_SPRAY | Freq: Every day | RESPIRATORY_TRACT | 11 refills | Status: DC

## 2021-04-27 NOTE — Telephone Encounter (Signed)
Spoke to patient, who is requesting a Rx for trelegy, as he feels that this medication is effective.  Rx has been sent to preferred pharmacy.  Nothing further needed.

## 2021-05-02 ENCOUNTER — Other Ambulatory Visit (INDEPENDENT_AMBULATORY_CARE_PROVIDER_SITE_OTHER): Payer: PPO

## 2021-05-02 ENCOUNTER — Other Ambulatory Visit: Payer: Self-pay

## 2021-05-02 DIAGNOSIS — R739 Hyperglycemia, unspecified: Secondary | ICD-10-CM | POA: Diagnosis not present

## 2021-05-02 DIAGNOSIS — E785 Hyperlipidemia, unspecified: Secondary | ICD-10-CM

## 2021-05-02 DIAGNOSIS — E871 Hypo-osmolality and hyponatremia: Secondary | ICD-10-CM | POA: Diagnosis not present

## 2021-05-02 LAB — BASIC METABOLIC PANEL
BUN: 14 mg/dL (ref 6–23)
CO2: 29 mEq/L (ref 19–32)
Calcium: 9.9 mg/dL (ref 8.4–10.5)
Chloride: 98 mEq/L (ref 96–112)
Creatinine, Ser: 0.94 mg/dL (ref 0.40–1.50)
GFR: 77.03 mL/min (ref >60.00)
Glucose, Bld: 85 mg/dL (ref 70–99)
Potassium: 4.1 mEq/L (ref 3.5–5.1)
Sodium: 135 mEq/L (ref 135–145)

## 2021-05-02 LAB — LIPID PANEL
Cholesterol: 135 mg/dL (ref 0–200)
HDL: 52.6 mg/dL (ref >39.00)
LDL Cholesterol: 50 mg/dL (ref 0–99)
NonHDL: 82.08
Total CHOL/HDL Ratio: 3
Triglycerides: 162 mg/dL — ABNORMAL HIGH (ref 0.0–149.0)
VLDL: 32.4 mg/dL (ref 0.0–40.0)

## 2021-05-02 LAB — HEPATIC FUNCTION PANEL
ALT: 20 U/L (ref 0–53)
AST: 19 U/L (ref 0–37)
Albumin: 4.6 g/dL (ref 3.5–5.2)
Alkaline Phosphatase: 58 U/L (ref 39–117)
Bilirubin, Direct: 0.2 mg/dL (ref 0.0–0.3)
Total Bilirubin: 0.8 mg/dL (ref 0.2–1.2)
Total Protein: 6.6 g/dL (ref 6.0–8.3)

## 2021-05-02 LAB — HEMOGLOBIN A1C: Hgb A1c MFr Bld: 6.1 % (ref 4.6–6.5)

## 2021-05-03 ENCOUNTER — Telehealth: Payer: Self-pay | Admitting: Internal Medicine

## 2021-05-03 NOTE — Telephone Encounter (Signed)
Patient is returning your call to go over his lab results.

## 2021-05-17 DIAGNOSIS — J40 Bronchitis, not specified as acute or chronic: Secondary | ICD-10-CM | POA: Diagnosis not present

## 2021-05-17 DIAGNOSIS — J329 Chronic sinusitis, unspecified: Secondary | ICD-10-CM | POA: Diagnosis not present

## 2021-05-17 DIAGNOSIS — B338 Other specified viral diseases: Secondary | ICD-10-CM | POA: Diagnosis not present

## 2021-05-17 DIAGNOSIS — B9689 Other specified bacterial agents as the cause of diseases classified elsewhere: Secondary | ICD-10-CM | POA: Diagnosis not present

## 2021-05-17 DIAGNOSIS — Z03818 Encounter for observation for suspected exposure to other biological agents ruled out: Secondary | ICD-10-CM | POA: Diagnosis not present

## 2021-05-19 DIAGNOSIS — H43813 Vitreous degeneration, bilateral: Secondary | ICD-10-CM | POA: Diagnosis not present

## 2021-05-19 DIAGNOSIS — H26491 Other secondary cataract, right eye: Secondary | ICD-10-CM | POA: Diagnosis not present

## 2021-06-02 DIAGNOSIS — H26492 Other secondary cataract, left eye: Secondary | ICD-10-CM | POA: Diagnosis not present

## 2021-06-15 ENCOUNTER — Encounter: Payer: Self-pay | Admitting: Pulmonary Disease

## 2021-06-15 ENCOUNTER — Ambulatory Visit: Payer: PPO | Admitting: Pulmonary Disease

## 2021-06-15 ENCOUNTER — Other Ambulatory Visit: Payer: Self-pay

## 2021-06-15 VITALS — BP 128/58 | HR 70 | Temp 96.8°F | Ht 66.0 in | Wt 162.6 lb

## 2021-06-15 DIAGNOSIS — J479 Bronchiectasis, uncomplicated: Secondary | ICD-10-CM | POA: Diagnosis not present

## 2021-06-15 DIAGNOSIS — Z87891 Personal history of nicotine dependence: Secondary | ICD-10-CM

## 2021-06-15 DIAGNOSIS — J449 Chronic obstructive pulmonary disease, unspecified: Secondary | ICD-10-CM | POA: Diagnosis not present

## 2021-06-15 MED ORDER — TRELEGY ELLIPTA 100-62.5-25 MCG/ACT IN AEPB: 1.0000 | INHALATION_SPRAY | Freq: Every day | RESPIRATORY_TRACT | 0 refills | Status: DC

## 2021-06-15 NOTE — Progress Notes (Signed)
Subjective:    Patient ID: James Peck, male    DOB: 07/29/1941, 80 y.o.   MRN: [id number] Chief Complaint  Patient presents with   Follow-up    Pt states new inhaler have worked great he can see a difference    HPI Patient is a 80 year old former smoker who follows up on the issue of cough and shortness of breath.  This is a scheduled visit.  Last visit was 13 April 2021.  The patient has known COPD on the basis of emphysema, mild bronchiectasis and reversible airways component.  He is currently maintained on Trelegy Ellipta 100.  Since his last visit when we instituted Trelegy he has been doing very well on this medication and notes that his tenacious sputum is now easy to expectorate.  Recall that prior CT scan of the chest performed in May did show retained secretions, he now states that these are very easy to clear and in general, are diminishing. He has not had any other major issues since his last visit.  He continues to participate in activities of daily living with no limitation.  Dyspnea only occurs on heavy exertion but this has also improved on the Trelegy.  To use his albuterol inhaler, has not required it since his last visit.  He does not endorse any new symptoms.  Overall he feels well and looks well.     DATA: 04/18/2020 PFTs: FEV1 1.40 L or 57% predicted, FVC 2.48 L or 71% predicted, FEV1/FVC 57%.  There is significant bronchodilator response with 17% change in FEV1 postbronchodilator.  Moderate air trapping, mild diffusion capacity impairment, consistent with COPD/emphysema with reversible airways component. 09/12/2020 2D echo: LVEF 60 to 65%, no regional wall motion normalities.  Grade I DD. 09/12/2020 left heart cath:Insignificant coronary artery disease with patent stent proximal/mid RCA, 50% stenosis mid/distal LAD with muscle bridge (Paraschos). 12/27/2020 CT chest: Emphysema, lower lobe posterior bronchiectasis, not new volume of retained secretions in bronchus.  No  lesions of concern.  Calcified atherosclerosis.   Review of Systems A 10 point review of systems was performed and it is as noted above otherwise negative.  Patient Active Problem List   Diagnosis Date Noted   Hyponatremia 04/09/2021   Subclavian artery stenosis, left ([place]) 02/04/2021   Carotid artery disease ([place]) 02/04/2021   Coronary artery disease    Chest pain    NSTEMI (non-ST elevated myocardial infarction) ([place])    COPD exacerbation ([place])    Exposure to confirmed case of COVID-19 07/26/2020   Allergic rhinitis 04/12/2020   Emphysema, unspecified ([place]) 04/12/2020   Former smoker 04/12/2020   Lung nodules 03/27/2020   Renal lesion 03/27/2020   Abnormal abdominal CT scan 02/16/2020   Left carotid bruit 11/10/2019   Right rotator cuff tear 08/19/2019   AC (acromioclavicular) arthritis 07/08/2019   Cough 06/30/2019   Healthcare maintenance 08/28/2018   Leg cramps 04/27/2018   Hyperglycemia 12/22/2017   Degenerative arthritis of left knee 06/04/2017   Degenerative tear of meniscus of left knee 02/14/2015   Acid reflux 01/27/2015   Adenomatous colon polyp 01/27/2015   Allergic state 01/27/2015   Arteriosclerosis of coronary artery 01/27/2015   Lumbar radiculopathy 01/27/2015   CAD in native artery 01/07/2014   Benign essential HTN 10/21/2013   HLD (hyperlipidemia) 10/21/2013   Social History   Tobacco Use   Smoking status: Former    Packs/day: 1.00    Years: 40.00    Pack years: 40.00    Types: Cigarettes  Quit date: 2000    Years since quitting: 22.8   Smokeless tobacco: Never  Substance Use Topics   Alcohol use: No   Allergies  Allergen Reactions   Ace Inhibitors     Cough   Enalapril Cough   Current Meds  Medication Sig   albuterol (VENTOLIN HFA) 108 (90 Base) MCG/ACT inhaler Inhale 1-2 puffs into the lungs every 6 (six) hours as needed for wheezing or shortness of breath.   aspirin EC 81 MG tablet Take 81 mg by mouth daily.    calcium carbonate  (OS-CAL - DOSED IN MG OF ELEMENTAL CALCIUM) 1250 (500 CA) MG tablet Take 1 tablet by mouth daily.    cetirizine (ZYRTEC) 10 MG tablet TAKE 1 TABLET BY MOUTH DAILY   chlorpheniramine (CHLOR-TRIMETON) 4 MG tablet Take 4 mg by mouth 2 (two) times daily as needed for allergies.   CINNAMON PO Take 1,200 mg by mouth 2 times daily at 12 noon and 4 pm.   clopidogrel (PLAVIX) 75 MG tablet Take 1 tablet (75 mg total) by mouth daily.   dextromethorphan-guaiFENesin (MUCINEX DM) 30-600 MG 12hr tablet Take 1 tablet by mouth 2 (two) times daily.   diclofenac sodium (VOLTAREN) 1 % GEL Apply 2 g 2 (two) times daily topically. To affected joint.   famotidine (PEPCID) 20 MG tablet Take 1 tablet (20 mg total) by mouth daily.   fluticasone (FLONASE) 50 MCG/ACT nasal spray TAKE 2 SPRAYS INTO BOTH NOSTRILS DAILY   Fluticasone-Umeclidin-Vilant (TRELEGY ELLIPTA) 100-62.5-25 MCG/INH AEPB Inhale 1 puff into the lungs daily.   GLUCOSAMINE-CHONDROITIN PO Take 1 capsule by mouth daily.   Misc Natural Products (BLACK CHERRY CONCENTRATE PO) Take 1,000 mg daily by mouth.   nitroGLYCERIN (NITROSTAT) 0.4 MG SL tablet Place 1 tablet (0.4 mg total) under the tongue every 5 (five) minutes as needed for chest pain.   Omega-3 Fatty Acids (FISH OIL) 1000 MG CAPS Take 1,000 mg by mouth daily.    rosuvastatin (CRESTOR) 20 MG tablet Take 1 tablet (20 mg total) by mouth daily.   sildenafil (REVATIO) 20 MG tablet TAKE 1 TO 2 TABLETS BY MOUTH EVERY DAY AS NEEDED   telmisartan (MICARDIS) 20 MG tablet TAKE ONE TABLET (20 MG) BY MOUTH EVERY DAY   Immunization History  Administered Date(s) Administered   Fluad Quad(high Dose 65+) 04/09/2019, 04/04/2021   Influenza Split 05/11/2014   Influenza, High Dose Seasonal PF 04/23/2017, 04/29/2018, 04/09/2019, 05/05/2020   Influenza-Unspecified 04/25/2015, 04/23/2017   PFIZER(Purple Top)SARS-COV-2 Vaccination 08/28/2019, 09/18/2019   Tdap 03/11/2013       Objective:   Physical Exam BP (!) 128/58  (BP Location: Right Arm, Patient Position: Sitting, Cuff Size: Normal)   Pulse 70   Temp (!) 96.8 F (36 C)   Ht 5\' 6"  (1.676 m)   Wt 162 lb 9.6 oz (73.8 kg)   SpO2 97%   BMI 26.24 kg/m  GENERAL: Well-developed, well-nourished elderly gentleman, in no acute distress, fully ambulatory.  No conversational dyspnea. HEAD: Normocephalic, atraumatic.  EYES: Pupils equal, round, reactive to light.  No scleral icterus.  MOUTH: Nose/mouth/throat not examined due to masking requirements for COVID 19. NECK: Supple. No thyromegaly. Trachea midline. No JVD.  No adenopathy. PULMONARY: Good air entry bilaterally.  No adventitious sounds. CARDIOVASCULAR: S1 and S2. Regular rate and rhythm.  No rubs, murmurs or gallops heard. ABDOMEN: Benign. MUSCULOSKELETAL: No joint deformity, no clubbing, no edema.  NEUROLOGIC: No focal deficit, no gait disturbance, speech is fluent. SKIN: Intact,warm,dry. PSYCH: Mood and behavior  normal.     Assessment & Plan:     ICD-10-CM   1. Asthma-COPD overlap syndrome ([facility])  J44.9    Well-controlled on Trelegy 100 Continue Trelegy Continue as needed albuterol    2. Bronchiectasis without complication ([place])  H82.9    Continue bronchial hygiene     3. Former smoker  Z87.891    No evidence of relapse     . Meds ordered this encounter  Medications   Fluticasone-Umeclidin-Vilant (TRELEGY ELLIPTA) 100-62.5-25 MCG/ACT AEPB    Sig: Inhale 1 puff into the lungs daily.    Dispense:  60 each    Refill:  0   Patient is doing very well from the pulmonary standpoint.  We will see him in follow-up in 6 months time.  He is to continue Trelegy and albuterol as needed.  He is to contact us prior to his follow-up appointment should any new difficulties arise.  Renold Don, MD Advanced Bronchoscopy PCCM [facility]    *This note was dictated using voice recognition software/Dragon.  Despite best efforts to proofread, errors can occur which can  change the meaning.  Any change was purely unintentional.

## 2021-06-15 NOTE — Patient Instructions (Signed)
I am glad you are doing well.  Continue Trelegy Ellipta.  We will see you in follow-up in 6 months time call sooner should any new problems arise.

## 2021-07-04 ENCOUNTER — Ambulatory Visit (INDEPENDENT_AMBULATORY_CARE_PROVIDER_SITE_OTHER): Payer: PPO

## 2021-07-04 VITALS — Ht 66.0 in | Wt 162.0 lb

## 2021-07-04 DIAGNOSIS — Z0000 Encounter for general adult medical examination without abnormal findings: Secondary | ICD-10-CM

## 2021-07-04 NOTE — Patient Instructions (Addendum)
James Peck , Thank you for taking time to come for your Medicare Wellness Visit. I appreciate your ongoing commitment to your health goals. Please review the following plan we discussed and let me know if I can assist you in the future.   These are the goals we discussed:  Goals       Patient Stated     Weight (lb) < 155 lb (70.3 kg) (pt-stated)      Other     Increase physical activity      Join Silver Sne[employer] program after the New Year        This is a list of the screening recommended for you and due dates:  Health Maintenance  Topic Date Due   Zoster (Shingles) Vaccine (1 of 2) 07/04/2021*   COVID-19 Vaccine (3 - Booster for Pfizer series) 07/20/2021*   Pneumonia Vaccine (1 - PCV) 07/04/2022*   Hepatitis C Screening: USPSTF Recommendation to screen - Ages 18-79 yo.  07/04/2022*   Tetanus Vaccine  03/12/2023   Flu Shot  Completed   HPV Vaccine  Aged Out  *Topic was postponed. The date shown is not the original due date.    Advanced directives: End of life planning; Advance aging; Advanced directives discussed.  Copy of current HCPOA/Living Will requested.    Conditions/risks identified: none new  Follow up in one year for your annual wellness visit.   Preventive Care 35 Years and Older, Male Preventive care refers to lifestyle choices and visits with your health care provider that can promote health and wellness. What does preventive care include? A yearly physical exam. This is also called an annual well check. Dental exams once or twice a year. Routine eye exams. Ask your health care provider how often you should have your eyes checked. Personal lifestyle choices, including: Daily care of your teeth and gums. Regular physical activity. Eating a healthy diet. Avoiding tobacco and drug use. Limiting alcohol use. Practicing safe sex. Taking low doses of aspirin every day. Taking vitamin and mineral supplements as recommended by your health care provider. What  happens during an annual well check? The services and screenings done by your health care provider during your annual well check will depend on your age, overall health, lifestyle risk factors, and family history of disease. Counseling  Your health care provider may ask you questions about your: Alcohol use. Tobacco use. Drug use. Emotional well-being. Home and relationship well-being. Sexual activity. Eating habits. History of falls. Memory and ability to understand (cognition). Work and work Statistician. Screening  You may have the following tests or measurements: Height, weight, and BMI. Blood pressure. Lipid and cholesterol levels. These may be checked every 5 years, or more frequently if you are over 11 years old. Skin check. Lung cancer screening. You may have this screening every year starting at age 73 if you have a 30-pack-year history of smoking and currently smoke or have quit within the past 15 years. Fecal occult blood test (FOBT) of the stool. You may have this test every year starting at age 36. Flexible sigmoidoscopy or colonoscopy. You may have a sigmoidoscopy every 5 years or a colonoscopy every 10 years starting at age 60. Prostate cancer screening. Recommendations will vary depending on your family history and other risks. Hepatitis C blood test. Hepatitis B blood test. Sexually transmitted disease (STD) testing. Diabetes screening. This is done by checking your blood sugar (glucose) after you have not eaten for a while (fasting). You may have this done  every 1-3 years. Abdominal aortic aneurysm (AAA) screening. You may need this if you are a current or former smoker. Osteoporosis. You may be screened starting at age 55 if you are at high risk. Talk with your health care provider about your test results, treatment options, and if necessary, the need for more tests. Vaccines  Your health care provider may recommend certain vaccines, such as: Influenza vaccine. This  is recommended every year. Tetanus, diphtheria, and acellular pertussis (Tdap, Td) vaccine. You may need a Td booster every 10 years. Zoster vaccine. You may need this after age 53. Pneumococcal 13-valent conjugate (PCV13) vaccine. One dose is recommended after age 70. Pneumococcal polysaccharide (PPSV23) vaccine. One dose is recommended after age 70. Talk to your health care provider about which screenings and vaccines you need and how often you need them. This information is not intended to replace advice given to you by your health care provider. Make sure you discuss any questions you have with your health care provider. Document Released: 08/12/2015 Document Revised: 04/04/2016 Document Reviewed: 05/17/2015 Elsevier Interactive Patient Education  2017 [place] Prevention in the Home Falls can cause injuries. They can happen to people of all ages. There are many things you can do to make your home safe and to help prevent falls. What can I do on the outside of my home? Regularly fix the edges of walkways and driveways and fix any cracks. Remove anything that might make you trip as you walk through a door, such as a raised step or threshold. Trim any bushes or trees on the path to your home. Use bright outdoor lighting. Clear any walking paths of anything that might make someone trip, such as rocks or tools. Regularly check to see if handrails are loose or broken. Make sure that both sides of any steps have handrails. Any raised decks and porches should have guardrails on the edges. Have any leaves, snow, or ice cleared regularly. Use sand or salt on walking paths during winter. Clean up any spills in your garage right away. This includes oil or grease spills. What can I do in the bathroom? Use night lights. Install grab bars by the toilet and in the tub and shower. Do not use towel bars as grab bars. Use non-skid mats or decals in the tub or shower. If you need to sit down  in the shower, use a plastic, non-slip stool. Keep the floor dry. Clean up any water that spills on the floor as soon as it happens. Remove soap buildup in the tub or shower regularly. Attach bath mats securely with double-sided non-slip rug tape. Do not have throw rugs and other things on the floor that can make you trip. What can I do in the bedroom? Use night lights. Make sure that you have a light by your bed that is easy to reach. Do not use any sheets or blankets that are too big for your bed. They should not hang down onto the floor. Have a firm chair that has side arms. You can use this for support while you get dressed. Do not have throw rugs and other things on the floor that can make you trip. What can I do in the kitchen? Clean up any spills right away. Avoid walking on wet floors. Keep items that you use a lot in easy-to-reach places. If you need to reach something above you, use a strong step stool that has a grab bar. Keep electrical cords out of the  way. Do not use floor polish or wax that makes floors slippery. If you must use wax, use non-skid floor wax. Do not have throw rugs and other things on the floor that can make you trip. What can I do with my stairs? Do not leave any items on the stairs. Make sure that there are handrails on both sides of the stairs and use them. Fix handrails that are broken or loose. Make sure that handrails are as Minnie as the stairways. Check any carpeting to make sure that it is firmly attached to the stairs. Fix any carpet that is loose or worn. Avoid having throw rugs at the top or bottom of the stairs. If you do have throw rugs, attach them to the floor with carpet tape. Make sure that you have a light switch at the top of the stairs and the bottom of the stairs. If you do not have them, ask someone to add them for you. What else can I do to help prevent falls? Wear shoes that: Do not have high heels. Have rubber bottoms. Are comfortable  and fit you well. Are closed at the toe. Do not wear sandals. If you use a stepladder: Make sure that it is fully opened. Do not climb a closed stepladder. Make sure that both sides of the stepladder are locked into place. Ask someone to hold it for you, if possible. Clearly mark and make sure that you can see: Any grab bars or handrails. First and last steps. Where the edge of each step is. Use tools that help you move around (mobility aids) if they are needed. These include: Canes. Walkers. Scooters. Crutches. Turn on the lights when you go into a dark area. Replace any light bulbs as soon as they burn out. Set up your furniture so you have a clear path. Avoid moving your furniture around. If any of your floors are uneven, fix them. If there are any pets around you, be aware of where they are. Review your medicines with your doctor. Some medicines can make you feel dizzy. This can increase your chance of falling. Ask your doctor what other things that you can do to help prevent falls. This information is not intended to replace advice given to you by your health care provider. Make sure you discuss any questions you have with your health care provider. Document Released: 05/12/2009 Document Revised: 12/22/2015 Document Reviewed: 08/20/2014 Elsevier Interactive Patient Education  2017 [employer].

## 2021-07-04 NOTE — Progress Notes (Signed)
Subjective:   James Peck is a 80 y.o. male who presents for Medicare Annual/Subsequent preventive examination.  Review of Systems    No ROS.  Medicare Wellness Virtual Visit.  Visual/audio telehealth visit, UTA vital signs.   See social history for additional risk factors.   Cardiac Risk Factors include: advanced age (>3men, >26 women);male gender;hypertension     Objective:    Today's Vitals   07/04/21 1320  Weight: 162 lb (73.5 kg)  Height: 5\' 6"  (1.676 m)   Body mass index is 26.15 kg/m.  Advanced Directives 07/04/2021 11/04/2020 09/12/2020 09/12/2020 09/11/2020 07/01/2020 03/06/2020  Does Patient Have a Medical Advance Directive? Yes Yes Yes Yes Yes Yes Yes  Type of Paramedic of [place];Living will Healthcare Power of [place] of [place] of [place] of [place];Living will -  Does patient want to make changes to medical advance directive? No - Patient declined Yes (MAU/Ambulatory/Procedural Areas - Information given) No - Patient declined - - No - Patient declined -  Copy of [place] in Chart? No - copy requested - No - copy requested No - copy requested - No - copy requested -    Current Medications (verified) Outpatient Encounter Medications as of 07/04/2021  Medication Sig   albuterol (VENTOLIN HFA) 108 (90 Base) MCG/ACT inhaler Inhale 1-2 puffs into the lungs every 6 (six) hours as needed for wheezing or shortness of breath.   aspirin EC 81 MG tablet Take 81 mg by mouth daily.    calcium carbonate (OS-CAL - DOSED IN MG OF ELEMENTAL CALCIUM) 1250 (500 CA) MG tablet Take 1 tablet by mouth daily.    cetirizine (ZYRTEC) 10 MG tablet TAKE 1 TABLET BY MOUTH DAILY   chlorpheniramine (CHLOR-TRIMETON) 4 MG tablet Take 4 mg by mouth 2 (two) times daily as needed for allergies.   CINNAMON PO Take 1,200 mg by mouth 2 times daily at 12 noon and 4 pm.    clopidogrel (PLAVIX) 75 MG tablet Take 1 tablet (75 mg total) by mouth daily.   dextromethorphan-guaiFENesin (MUCINEX DM) 30-600 MG 12hr tablet Take 1 tablet by mouth 2 (two) times daily.   diclofenac sodium (VOLTAREN) 1 % GEL Apply 2 g 2 (two) times daily topically. To affected joint.   famotidine (PEPCID) 20 MG tablet Take 1 tablet (20 mg total) by mouth daily.   fluticasone (FLONASE) 50 MCG/ACT nasal spray TAKE 2 SPRAYS INTO BOTH NOSTRILS DAILY   Fluticasone-Umeclidin-Vilant (TRELEGY ELLIPTA) 100-62.5-25 MCG/ACT AEPB Inhale 1 puff into the lungs daily.   GLUCOSAMINE-CHONDROITIN PO Take 1 capsule by mouth daily.   Misc Natural Products (BLACK CHERRY CONCENTRATE PO) Take 1,000 mg daily by mouth.   nitroGLYCERIN (NITROSTAT) 0.4 MG SL tablet Place 1 tablet (0.4 mg total) under the tongue every 5 (five) minutes as needed for chest pain.   Omega-3 Fatty Acids (FISH OIL) 1000 MG CAPS Take 1,000 mg by mouth daily.    rosuvastatin (CRESTOR) 20 MG tablet Take 1 tablet (20 mg total) by mouth daily.   sildenafil (REVATIO) 20 MG tablet TAKE 1 TO 2 TABLETS BY MOUTH EVERY DAY AS NEEDED   telmisartan (MICARDIS) 20 MG tablet TAKE ONE TABLET (20 MG) BY MOUTH EVERY DAY   [DISCONTINUED] Fluticasone-Umeclidin-Vilant (TRELEGY ELLIPTA) 100-62.5-25 MCG/INH AEPB Inhale 1 puff into the lungs daily.   No facility-administered encounter medications on file as of 07/04/2021.    Allergies (verified) Ace inhibitors and Enalapril  History: Past Medical History:  Diagnosis Date   Allergy    Arthritis    Coronary artery disease    GERD (gastroesophageal reflux disease)    History of hiatal hernia    Hypercholesteremia    Hypertension    Past Surgical History:  Procedure Laterality Date   CARDIAC CATHETERIZATION     cardiac stents     CATARACT EXTRACTION W/PHACO Right 09/10/2017   Procedure: CATARACT EXTRACTION PHACO AND INTRAOCULAR LENS PLACEMENT ([place]);  Surgeon: Birder Robson, MD;  Location: [facility] ORS;   Service: Ophthalmology;  Laterality: Right;  [place] 01:01.7 AP% 15.3 CDE 9.38 Fluid pack lot # [id number]   CATARACT EXTRACTION W/PHACO Left 10/02/2017   Procedure: CATARACT EXTRACTION PHACO AND INTRAOCULAR LENS PLACEMENT ([facility]);  Surgeon: Birder Robson, MD;  Location: [facility] ORS;  Service: Ophthalmology;  Laterality: Left;  [place] 00:42.3 AP% 16.2 CDE 6.85 Fluid Pack Lot # [id number]   COLONOSCOPY WITH PROPOFOL N/A 06/28/2017   Procedure: COLONOSCOPY WITH PROPOFOL;  Surgeon: Manya Silvas, MD;  Location: [facility] ENDOSCOPY;  Service: Endoscopy;  Laterality: N/A;   CORONARY ANGIOPLASTY     STENTS X 2   KNEE ARTHROSCOPY     LEFT HEART CATH AND CORONARY ANGIOGRAPHY N/A 09/12/2020   Procedure: LEFT HEART CATH AND CORONARY ANGIOGRAPHY;  Surgeon: Isaias Cowman, MD;  Location: [facility] [place] [facility];  Service: Cardiovascular;  Laterality: N/A;   Family History  Problem Relation Age of Onset   Heart disease Mother    Diabetes Mother    Heart disease Father    Diabetes Father    Alzheimer's disease Brother    Stroke Brother    Social History   Socioeconomic History   Marital status: Married    Spouse name: Not on file   Number of children: Not on file   Years of education: Not on file   Highest education level: Not on file  Occupational History   Not on file  Tobacco Use   Smoking status: Former    Packs/day: 1.00    Years: 40.00    Pack years: 40.00    Types: Cigarettes    Quit date: 2000    Years since quitting: 22.9   Smokeless tobacco: Never  Vaping Use   Vaping Use: Never used  Substance and Sexual Activity   Alcohol use: No   Drug use: No   Sexual activity: Not on file  Other Topics Concern   Not on file  Social History Narrative   Not on file   Social Determinants of Health   Financial Resource Strain: Low Risk    Difficulty of Paying Living Expenses: Not hard at all  Food Insecurity: No Food Insecurity   Worried About Charity fundraiser in the Last Year: Never  true   [place] in the Last Year: Never true  Transportation Needs: No Transportation Needs   Lack of Transportation (Medical): No   Lack of Transportation (Non-Medical): No  Physical Activity: Sufficiently Active   Days of Exercise per Week: 5 days   Minutes of Exercise per Session: 30 min  Stress: No Stress Concern Present   Feeling of Stress : Not at all  Social Connections: Unknown   Frequency of Communication with Friends and Family: Not on file   Frequency of Social Gatherings with Friends and Family: Not on file   Attends Religious Services: Not on file   Active Member of Clubs or Organizations: Not on file   Attends Archivist Meetings:  Not on file   Marital Status: Married    Tobacco Counseling Counseling given: Not Answered   Clinical Intake:  Pre-visit preparation completed: Yes        Diabetes: No  How often do you need to have someone help you when you read instructions, pamphlets, or other written materials from your doctor or pharmacy?: 1 - Never  Interpreter Needed?: No      Activities of Daily Living In your present state of health, do you have any difficulty performing the following activities: 07/04/2021 09/12/2020  Hearing? N N  Vision? N N  Difficulty concentrating or making decisions? N N  Walking or climbing stairs? N N  Dressing or bathing? N N  Doing errands, shopping? N N  Preparing Food and eating ? N -  Using the Toilet? N -  In the past six months, have you accidently leaked urine? N -  Do you have problems with loss of bowel control? N -  Managing your Medications? N -  Managing your Finances? N -  Housekeeping or managing your Housekeeping? N -  Some recent data might be hidden    Patient Care Team: Einar Pheasant, MD as PCP - General (Internal Medicine)  Indicate any recent Medical Services you may have received from other than [facility] providers in the past year (date may be approximate).     Assessment:    This is a routine wellness examination for James Peck.  Virtual Visit via Telephone Note  I connected with  James Peck on 07/04/21 at  1:15 PM EST by telephone and verified that I am speaking with the correct person using two identifiers.  Persons participating in the virtual visit: patient/Nurse Health Advisor   I discussed the limitations, risks, security and privacy concerns of performing an evaluation and management service by telephone and the availability of in person appointments. The patient expressed understanding and agreed to proceed.  Interactive audio and video telecommunications were attempted between this nurse and patient, however failed, due to patient having technical difficulties OR patient did not have access to video capability.  We continued and completed visit with audio only.  Some vital signs may be absent or patient reported.   Hearing/Vision screen Hearing Screening - Comments:: Patient is able to hear conversational tones without difficulty.  No issues reported.   Vision Screening - Comments:: Wears corrective lenses They have seen their ophthalmologist in the last 12 months.    Dietary issues and exercise activities discussed: Current Exercise Habits: Home exercise routine, Type of exercise: walking, Time (Minutes): 20, Frequency (Times/Week): 4, Weekly Exercise (Minutes/Week): 80, Intensity: Mild   Goals Addressed             This Visit's Progress    Increase physical activity       Join Silver Sneaker program after the New Year       Depression Screen PHQ 2/9 Scores 07/04/2021 01/20/2021 12/07/2020 11/07/2020 07/01/2020 06/30/2019 02/27/2019  PHQ - 2 Score 0 0 0 1 0 0 0  PHQ- 9 Score - 0 0 6 - - -    Fall Risk Fall Risk  07/04/2021 11/04/2020 07/01/2020 02/23/2020 06/30/2019  Falls in the past year? 0 0 0 0 0  Comment - - - Emmi Telephone Survey: data to providers prior to load -  Number falls in past yr: 0 - 0 - -  Injury with Fall? 0 - 0 - -  Follow up  Falls evaluation completed Education provided;Falls prevention discussed Falls  evaluation completed - Falls prevention discussed;Education provided    FALL RISK PREVENTION PERTAINING TO THE HOME: Home free of loose throw rugs in walkways, pet beds, electrical cords, etc? Yes  Adequate lighting in your home to reduce risk of falls? Yes   ASSISTIVE DEVICES UTILIZED TO PREVENT FALLS: Life alert? No  Use of a cane, walker or w/c? No  Grab bars in the bathroom? Yes  Shower chair or bench in shower? Yes  Elevated toilet seat or a handicapped toilet? Yes   TIMED UP AND GO: Was the test performed? No .   Cognitive Function: Patient is alert and oriented.  MMSE - Mini Mental State Exam 06/24/2018  Orientation to time 5  Orientation to Place 5  Registration 3  Attention/ Calculation 5  Recall 3  Language- name 2 objects 2  Language- repeat 1  Language- follow 3 step command 3  Language- read & follow direction 1  Write a sentence 1  Copy design 1  Total score 30     6CIT Screen 07/04/2021 07/01/2020 06/30/2019  What Year? 0 points 0 points 0 points  What month? 0 points 0 points 0 points  What time? 0 points 0 points 0 points  Count back from 20 0 points - 0 points  Months in reverse 0 points 0 points 0 points  Repeat phrase 0 points 2 points 0 points  Total Score 0 - 0    Immunizations Immunization History  Administered Date(s) Administered   Fluad Quad(high Dose 65+) 04/09/2019, 04/04/2021   Influenza Split 05/11/2014   Influenza, High Dose Seasonal PF 04/23/2017, 04/29/2018, 04/09/2019, 05/05/2020   Influenza-Unspecified 04/25/2015, 04/23/2017   PFIZER(Purple Top)SARS-COV-2 Vaccination 08/28/2019, 09/18/2019   Tdap 03/11/2013   Pneumococcal vaccine status: Due, Education has been provided regarding the importance of this vaccine. Advised may receive this vaccine at local pharmacy or Health Dept. Aware to provide a copy of the vaccination record if obtained from local  pharmacy or Health Dept. Verbalized acceptance and understanding. Deferred.   Screening Tests Health Maintenance  Topic Date Due   Zoster Vaccines- Shingrix (1 of 2) 07/04/2021 (Originally 09/12/1990)   COVID-19 Vaccine (3 - Booster for Pfizer series) 07/20/2021 (Originally 11/13/2019)   Pneumonia Vaccine 79+ Years old (1 - PCV) 07/04/2022 (Originally 09/12/1946)   Hepatitis C Screening  07/04/2022 (Originally 09/12/1958)   TETANUS/TDAP  03/12/2023   INFLUENZA VACCINE  Completed   HPV VACCINES  Aged Out   Health Maintenance There are no preventive care reminders to display for this patient.  Colorectal cancer screening: No longer required.   Lung Cancer Screening: (Low Dose CT Chest recommended if Age 67-80 years, 30 pack-year currently smoking OR have quit w/in 15years.) does not qualify.   Hepatitis C Screening: does not qualify  Vision Screening: Recommended annual ophthalmology exams for early detection of glaucoma and other disorders of the eye.  Dental Screening: Recommended annual dental exams for proper oral hygiene  Community Resource Referral / Chronic Care Management: CRR required this visit?  No   CCM required this visit?  No      Plan:   Keep all routine maintenance appointments.   I have personally reviewed and noted the following in the patient's chart:   Medical and social history Use of alcohol, tobacco or illicit drugs  Current medications and supplements including opioid prescriptions. Patient is not currently taking opioid prescriptions. Functional ability and status Nutritional status Physical activity Advanced directives List of other physicians Hospitalizations, surgeries, and ER visits in  previous 12 months Vitals Screenings to include cognitive, depression, and falls Referrals and appointments  In addition, I have reviewed and discussed with patient certain preventive protocols, quality metrics, and best practice recommendations. A written  personalized care plan for preventive services as well as general preventive health recommendations were provided to patient.     Varney Biles, LPN   14/10/3152

## 2021-07-05 ENCOUNTER — Other Ambulatory Visit: Payer: Self-pay

## 2021-07-05 ENCOUNTER — Encounter: Payer: Self-pay | Admitting: Internal Medicine

## 2021-07-05 ENCOUNTER — Telehealth: Payer: Self-pay | Admitting: Internal Medicine

## 2021-07-05 ENCOUNTER — Telehealth (INDEPENDENT_AMBULATORY_CARE_PROVIDER_SITE_OTHER): Payer: PPO | Admitting: Internal Medicine

## 2021-07-05 DIAGNOSIS — U071 COVID-19: Secondary | ICD-10-CM

## 2021-07-05 DIAGNOSIS — J439 Emphysema, unspecified: Secondary | ICD-10-CM

## 2021-07-05 MED ORDER — PREDNISONE 10 MG PO TABS: ORAL_TABLET | ORAL | 0 refills | Status: DC

## 2021-07-05 MED ORDER — MOLNUPIRAVIR EUA 200MG CAPSULE: 4.0000 | ORAL_CAPSULE | Freq: Two times a day (BID) | ORAL | 0 refills | Status: AC

## 2021-07-05 NOTE — Telephone Encounter (Signed)
Scheduled fo rtoday at 1

## 2021-07-05 NOTE — Telephone Encounter (Signed)
Ok to work in today

## 2021-07-05 NOTE — Telephone Encounter (Signed)
Reason for Triage Call /in person: Covid Positive   Symptoms:Moderate   Pain Scale:headaches, congestion, coughing, wheezing, body aches, chills, fever. present - not adequately treated, not receiving treatment, and level of pain (1-10, 10 severe), 0   Onset: Yesterday morning   Duration:2 days   Medications:medication not used, Ibuprofen, Tylenol used but not effective   Last seen for this problem:NA   Outcome: Patient needs to be evaluated within 24 hours. Patient is requesting paxlovid for tx.    Attach Access Nurse Triage Note.

## 2021-07-05 NOTE — Telephone Encounter (Signed)
Patient  wife called in Patient and wife tested positive for covid  home test and requesting medication for covid  having  symptoms  fever  last night muscle ache, coughing , body chills a . Xfer to Access Nurse

## 2021-07-05 NOTE — Progress Notes (Signed)
Patient ID: James Peck, male   DOB: April 07, 1941, 80 y.o.   MRN: [id number]   Virtual Visit via video Note  This visit type was conducted due to national recommendations for restrictions regarding the COVID-19 pandemic (e.g. social distancing).  This format is felt to be most appropriate for this patient at this time.  All issues noted in this document were discussed and addressed.  No physical exam was performed (except for noted visual exam findings with Video Visits).   I connected with [name] by a video enabled telemedicine application and verified that I am speaking with the correct person using two identifiers. Location patient: home Location provider: work  Persons participating in the virtual visit: patient, provider  The limitations, risks, security and privacy concerns of performing an evaluation and management service by video and the availability of in person appointments have been discussed.  It has also been discussed with the patient that there may be a patient responsible charge related to this service. The patient expressed understanding and agreed to proceed.   Reason for visit: work in appt.   HPI: Work in - Covid positive.  Symptoms started Monday 07/03/21.  Noticed increased cough and aching.  No fever.  Nasal stuffiness and increased sinus pressure.  Headache.  No earache.  Sore throat.  Chest congestion and increased cough.  No wheezing.  Some coughing fits.  No chest pain. No nausea or vomiting.  No diarrhea.  Had RSV - one month ago.  Using trelegy.  Has used rescue inhaler 2x yesterday.  Taking coricidin, robitussin and mucinex.     ROS: See pertinent positives and negatives per HPI.  Past Medical History:  Diagnosis Date   Allergy    Arthritis    Coronary artery disease    GERD (gastroesophageal reflux disease)    History of hiatal hernia    Hypercholesteremia    Hypertension     Past Surgical History:  Procedure Laterality Date   CARDIAC  CATHETERIZATION     cardiac stents     CATARACT EXTRACTION W/PHACO Right 09/10/2017   Procedure: CATARACT EXTRACTION PHACO AND INTRAOCULAR LENS PLACEMENT ([place]);  Surgeon: Birder Robson, MD;  Location: [facility] ORS;  Service: Ophthalmology;  Laterality: Right;  [place] 01:01.7 AP% 15.3 CDE 9.38 Fluid pack lot # [id number]   CATARACT EXTRACTION W/PHACO Left 10/02/2017   Procedure: CATARACT EXTRACTION PHACO AND INTRAOCULAR LENS PLACEMENT ([facility]);  Surgeon: Birder Robson, MD;  Location: [facility] OR[facility];  Service: Ophthalmology;  Laterality: Left;  [place] 00:42.3 AP% 16.2 CDE 6.85 Fluid Pack Lot # [id number]   COLONOSCOPY WITH PROPOFOL N/A 06/28/2017   Procedure: COLONOSCOPY WITH PROPOFOL;  Surgeon: Manya Silvas, MD;  Location: [facility] END[facility]COPY;  Service: Endoscopy;  Laterality: N/A;   CORONARY ANGIOPLASTY     STENTS X 2   KNEE ARTHROSCOPY     LEFT HEART CATH AND CORONARY ANGIOGRAPHY N/A 09/12/2020   Procedure: LEFT HEART CATH AND CORONARY ANGIOGRAPHY;  Surgeon: Isaias Cowman, MD;  Location: [facility];  Service: Cardiovascular;  Laterality: N/A;    Family History  Problem Relation Age of Onset   Heart disease Mother    Diabetes Mother    Heart disease Father    Diabetes Father    Alzheimer's disease Brother    Stroke Brother     SOCIAL HX: reviewed.    Current Outpatient Medications:    albuterol (VENTOLIN HFA) 108 (90 Base) MCG/ACT inhaler, Inhale 1-2 puffs into the lungs every 6 (six) hours as  needed for wheezing or shortness of breath., Disp: 18 g, Rfl: 1   aspirin EC 81 MG tablet, Take 81 mg by mouth daily. , Disp: , Rfl:    calcium carbonate (OS-CAL - DOSED IN MG OF ELEMENTAL CALCIUM) 1250 (500 CA) MG tablet, Take 1 tablet by mouth daily. , Disp: , Rfl:    cetirizine (ZYRTEC) 10 MG tablet, TAKE 1 TABLET BY MOUTH DAILY, Disp: 90 tablet, Rfl: 1   chlorpheniramine (CHLOR-TRIMETON) 4 MG tablet, Take 4 mg by mouth 2 (two) times daily as needed for allergies., Disp: , Rfl:     CINNAMON PO, Take 1,200 mg by mouth 2 times daily at 12 noon and 4 pm., Disp: , Rfl:    clopidogrel (PLAVIX) 75 MG tablet, Take 1 tablet (75 mg total) by mouth daily., Disp: 30 tablet, Rfl: 0   dextromethorphan-guaiFENesin (MUCINEX DM) 30-600 MG 12hr tablet, Take 1 tablet by mouth 2 (two) times daily., Disp: , Rfl:    famotidine (PEPCID) 20 MG tablet, Take 1 tablet (20 mg total) by mouth daily., Disp: 90 tablet, Rfl: 1   fluticasone (FLONASE) 50 MCG/ACT nasal spray, TAKE 2 SPRAYS INTO BOTH NOSTRILS DAILY, Disp: 16 g, Rfl: 3   Fluticasone-Umeclidin-Vilant (TRELEGY ELLIPTA) 100-62.5-25 MCG/ACT AEPB, Inhale 1 puff into the lungs daily., Disp: 60 each, Rfl: 0   GLUCOSAMINE-CHONDROITIN PO, Take 1 capsule by mouth daily., Disp: , Rfl:    Misc Natural Products (BLACK CHERRY CONCENTRATE PO), Take 1,000 mg daily by mouth., Disp: , Rfl:    molnupiravir EUA (LAGEVRIO) 200 mg CAPS capsule, Take 4 capsules (800 mg total) by mouth 2 (two) times daily for 5 days., Disp: 40 capsule, Rfl: 0   nitroGLYCERIN (NITROSTAT) 0.4 MG SL tablet, Place 1 tablet (0.4 mg total) under the tongue every 5 (five) minutes as needed for chest pain., Disp: 30 tablet, Rfl: 0   Omega-3 Fatty Acids (FISH OIL) 1000 MG CAPS, Take 1,000 mg by mouth daily. , Disp: , Rfl:    predniSONE (DELTASONE) 10 MG tablet, Take 4 tablets x 1 day and then decrease by 1/2 tablet per day until down to zero mg., Disp: 18 tablet, Rfl: 0   rosuvastatin (CRESTOR) 20 MG tablet, Take 1 tablet (20 mg total) by mouth daily., Disp: 30 tablet, Rfl: 0   sildenafil (REVATIO) 20 MG tablet, TAKE 1 TO 2 TABLETS BY MOUTH EVERY DAY AS NEEDED, Disp: 60 tablet, Rfl: 0   telmisartan (MICARDIS) 20 MG tablet, TAKE ONE TABLET (20 MG) BY MOUTH EVERY DAY, Disp: 90 tablet, Rfl: 1  EXAM:  VITALS per patient if applicable: 32.4, 401/02, pulse 94, O2 sats:  97%  GENERAL: alert, oriented, appears to be in no acute distress  HEENT: atraumatic, conjunttiva clear, no obvious  abnormalities on inspection of external nose and ears  NECK: normal movements of the head and neck  LUNGS: on inspection no signs of respiratory distress, breathing rate appears normal, no obvious gross SOB.  Increased cough with forced expiration.   CV: no obvious cyanosis  PSYCH/NEURO: pleasant and cooperative, no obvious depression or anxiety, speech and thought processing grossly intact  ASSESSMENT AND PLAN:  Discussed the following assessment and plan:  Problem List Items Addressed This Visit     COVID-19 virus infection    Symptoms started 07/03/21.  Tested positive for covid.  Increased cough and congestion as outlined.  Discussed oral antivirals and EUA.  Discussed possible side effects.  Start molnupiravir.  Also given increased coughing fits, etc - prednisone  taper as directed.  Continue mucinex/robitussin.  Continue trelegy.  Has rescue inhaler if needed.  Discussed quarantine guidelines.  Call with update.        Relevant Medications   molnupiravir EUA (LAGEVRIO) 200 mg CAPS capsule   Emphysema, unspecified ([place])    Continue trelegy.  Has rescue inhaler if needed.  Treatment for covid as outlined.       Relevant Medications   predniSONE (DELTASONE) 10 MG tablet    Return if symptoms worsen or fail to improve.   I discussed the assessment and treatment plan with the patient. The patient was provided an opportunity to ask questions and all were answered. The patient agreed with the plan and demonstrated an understanding of the instructions.   The patient was advised to call back or seek an in-person evaluation if the symptoms worsen or if the condition fails to improve as anticipated.    Einar Pheasant, MD

## 2021-07-06 ENCOUNTER — Encounter: Payer: Self-pay | Admitting: Internal Medicine

## 2021-07-06 DIAGNOSIS — U071 COVID-19: Secondary | ICD-10-CM | POA: Insufficient documentation

## 2021-07-06 NOTE — Assessment & Plan Note (Signed)
Symptoms started 07/03/21.  Tested positive for covid.  Increased cough and congestion as outlined.  Discussed oral antivirals and EUA.  Discussed possible side effects.  Start molnupiravir.  Also given increased coughing fits, etc - prednisone taper as directed.  Continue mucinex/robitussin.  Continue trelegy.  Has rescue inhaler if needed.  Discussed quarantine guidelines.  Call with update.

## 2021-07-06 NOTE — Assessment & Plan Note (Signed)
Continue trelegy.  Has rescue inhaler if needed.  Treatment for covid as outlined.

## 2021-07-17 ENCOUNTER — Other Ambulatory Visit: Payer: Self-pay | Admitting: Internal Medicine

## 2021-07-25 ENCOUNTER — Telehealth: Payer: Self-pay | Admitting: *Deleted

## 2021-07-25 NOTE — Telephone Encounter (Signed)
I would recommend rescheduling labs - will do them all together.

## 2021-07-25 NOTE — Telephone Encounter (Signed)
Lab appt canceled  

## 2021-07-25 NOTE — Telephone Encounter (Signed)
Please place future orders for lab appt.  

## 2021-07-25 NOTE — Telephone Encounter (Signed)
Patient had fasting labs done 05/02/2021. Scheduled for fasting labs 08/01/21. Appt with you 08/04/21. Too soon to check A1C. Do you want to check other fasting labs or reschedule lab appt so they can be done together?

## 2021-07-31 DIAGNOSIS — R051 Acute cough: Secondary | ICD-10-CM | POA: Diagnosis not present

## 2021-07-31 DIAGNOSIS — J101 Influenza due to other identified influenza virus with other respiratory manifestations: Secondary | ICD-10-CM | POA: Diagnosis not present

## 2021-08-01 ENCOUNTER — Other Ambulatory Visit: Payer: PPO

## 2021-08-02 ENCOUNTER — Other Ambulatory Visit: Payer: PPO

## 2021-08-04 ENCOUNTER — Ambulatory Visit (INDEPENDENT_AMBULATORY_CARE_PROVIDER_SITE_OTHER): Payer: PPO | Admitting: Internal Medicine

## 2021-08-04 ENCOUNTER — Encounter: Payer: Self-pay | Admitting: Internal Medicine

## 2021-08-04 DIAGNOSIS — I779 Disorder of arteries and arterioles, unspecified: Secondary | ICD-10-CM | POA: Diagnosis not present

## 2021-08-04 DIAGNOSIS — J439 Emphysema, unspecified: Secondary | ICD-10-CM

## 2021-08-04 DIAGNOSIS — I10 Essential (primary) hypertension: Secondary | ICD-10-CM | POA: Diagnosis not present

## 2021-08-04 DIAGNOSIS — J111 Influenza due to unidentified influenza virus with other respiratory manifestations: Secondary | ICD-10-CM

## 2021-08-04 DIAGNOSIS — I771 Stricture of artery: Secondary | ICD-10-CM | POA: Diagnosis not present

## 2021-08-04 DIAGNOSIS — Z86010 Personal history of colonic polyps: Secondary | ICD-10-CM | POA: Diagnosis not present

## 2021-08-04 MED ORDER — PREDNISONE 10 MG PO TABS: ORAL_TABLET | ORAL | 0 refills | Status: DC

## 2021-08-04 NOTE — Progress Notes (Addendum)
Patient ID: James Peck, male   DOB: 05-31-1941, 81 y.o.   MRN: [id number]   Virtual Visit via telephone Note  This visit type was conducted due to national recommendations for restrictions regarding the COVID-19 pandemic (e.g. social distancing).  This format is felt to be most appropriate for this patient at this time.  All issues noted in this document were discussed and addressed.  No physical exam was performed (except for noted visual exam findings with Video Visits).   I connected with James Peck by telephone and verified that I am speaking with the correct person using two identifiers. Location patient: home Location provider: work Persons participating in the telephone visit: patient, provider  The limitations, risks, security and privacy concerns of performing an evaluation and management service by telephone and the availability of in person appointments have been discussed.  It has also been discussed with the patient that there may be a patient responsible charge related to this service. The patient expressed understanding and agreed to proceed.   Reason for visit: follow up appt  HPI: Tested positive for flu 07/31/21.  Symptoms started 07/29/21 with headache and aching.  Was evaluated at Urgent Care.  Treated with tamiflu.  He will finish tamiflu tomorrow am.  Reports he is still coughing.  No chest pain or chest tightness.  Some sob previously, but this is getting better.  Using trelegy regularly.  Increased cough - coughing fits intermittently.  Occasionally productive mucus.  No nausea, vomiting or diarrhea.  No fever.  Some head congestion.  Discussed flonase and saline nasal spray.  Eating and drinking.  Also discussed due colonoscopy.  Agreeable to referral.     ROS: See pertinent positives and negatives per HPI.  Past Medical History:  Diagnosis Date   Allergy    Arthritis    Coronary artery disease    GERD (gastroesophageal reflux disease)    History of hiatal hernia     Hypercholesteremia    Hypertension     Past Surgical History:  Procedure Laterality Date   CARDIAC CATHETERIZATION     cardiac stents     CATARACT EXTRACTION W/PHACO Right 09/10/2017   Procedure: CATARACT EXTRACTION PHACO AND INTRAOCULAR LENS PLACEMENT ([place]);  Surgeon: Birder Robson, MD;  Location: [facility] OR[facility];  Service: Ophthalmology;  Laterality: Right;  [place] 01:01.7 AP% 15.3 CDE 9.38 Fluid pack lot # [id number]   CATARACT EXTRACTION W/PHACO Left 10/02/2017   Procedure: CATARACT EXTRACTION PHACO AND INTRAOCULAR LENS PLACEMENT ([facility]);  Surgeon: Birder Robson, MD;  Location: [facility];  Service: Ophthalmology;  Laterality: Left;  [place] 00:42.3 AP% 16.2 CDE 6.85 Fluid Pack Lot # [id number]   COLONOSCOPY WITH PROPOFOL N/A 06/28/2017   Procedure: COLONOSCOPY WITH PROPOFOL;  Surgeon: Manya Silvas, MD;  Location: [facility] ENDOSCOPY;  Service: Endoscopy;  Laterality: N/A;   CORONARY ANGIOPLASTY     STENTS X 2   KNEE ARTHROSCOPY     LEFT HEART CATH AND CORONARY ANGIOGRAPHY N/A 09/12/2020   Procedure: LEFT HEART CATH AND CORONARY ANGIOGRAPHY;  Surgeon: Isaias Cowman, MD;  Location: [facility];  Service: Cardiovascular;  Laterality: N/A;    Family History  Problem Relation Age of Onset   Heart disease Mother    Diabetes Mother    Heart disease Father    Diabetes Father    Alzheimer's disease Brother    Stroke Brother     SOCIAL HX: reviewed.    Current Outpatient Medications:    albuterol (VENTOLIN HFA) 108 (90 Base)  MCG/ACT inhaler, Inhale 1-2 puffs into the lungs every 6 (six) hours as needed for wheezing or shortness of breath., Disp: 18 g, Rfl: 1   aspirin EC 81 MG tablet, Take 81 mg by mouth daily. , Disp: , Rfl:    calcium carbonate (OS-CAL - DOSED IN MG OF ELEMENTAL CALCIUM) 1250 (500 CA) MG tablet, Take 1 tablet by mouth daily. , Disp: , Rfl:    cetirizine (ZYRTEC) 10 MG tablet, TAKE 1 TABLET BY MOUTH DAILY, Disp: 90 tablet, Rfl: 1   chlorpheniramine  (CHLOR-TRIMETON) 4 MG tablet, Take 4 mg by mouth 2 (two) times daily as needed for allergies., Disp: , Rfl:    CINNAMON PO, Take 1,200 mg by mouth 2 times daily at 12 noon and 4 pm., Disp: , Rfl:    clopidogrel (PLAVIX) 75 MG tablet, Take 1 tablet (75 mg total) by mouth daily., Disp: 30 tablet, Rfl: 0   dextromethorphan-guaiFENesin (MUCINEX DM) 30-600 MG 12hr tablet, Take 1 tablet by mouth 2 (two) times daily., Disp: , Rfl:    famotidine (PEPCID) 20 MG tablet, TAKE 1 TABLET BY MOUTH DAILY, Disp: 90 tablet, Rfl: 1   fluticasone (FLONASE) 50 MCG/ACT nasal spray, TAKE 2 SPRAYS INTO BOTH NOSTRILS DAILY, Disp: 16 g, Rfl: 3   Fluticasone-Umeclidin-Vilant (TRELEGY ELLIPTA) 100-62.5-25 MCG/ACT AEPB, Inhale 1 puff into the lungs daily., Disp: 60 each, Rfl: 0   GLUCOSAMINE-CHONDROITIN PO, Take 1 capsule by mouth daily., Disp: , Rfl:    Misc Natural Products (BLACK CHERRY CONCENTRATE PO), Take 1,000 mg daily by mouth., Disp: , Rfl:    nitroGLYCERIN (NITROSTAT) 0.4 MG SL tablet, Place 1 tablet (0.4 mg total) under the tongue every 5 (five) minutes as needed for chest pain., Disp: 30 tablet, Rfl: 0   Omega-3 Fatty Acids (FISH OIL) 1000 MG CAPS, Take 1,000 mg by mouth daily. , Disp: , Rfl:    predniSONE (DELTASONE) 10 MG tablet, Take 4 tablets x 1 day and then decrease by 1/2 tablet per day until down to zero mg., Disp: 18 tablet, Rfl: 0   rosuvastatin (CRESTOR) 20 MG tablet, Take 1 tablet (20 mg total) by mouth daily., Disp: 30 tablet, Rfl: 0   sildenafil (REVATIO) 20 MG tablet, TAKE 1 TO 2 TABLETS BY MOUTH EVERY DAY AS NEEDED, Disp: 60 tablet, Rfl: 0   telmisartan (MICARDIS) 20 MG tablet, TAKE ONE TABLET (20 MG) BY MOUTH EVERY DAY, Disp: 90 tablet, Rfl: 1  EXAM:  GENERAL: alert. Sounds to be in no acute distress.  Answering questions appropriately.   LUNGS: increased cough with forced expiration.    PSYCH/NEURO: pleasant and cooperative, no obvious depression or anxiety, speech and thought processing  grossly intact  ASSESSMENT AND PLAN:  Discussed the following assessment and plan:  Problem List Items Addressed This Visit     Benign essential HTN    Continue micardis.  Follow pressures.       Carotid artery disease (HCC)    Carotid ultrasound <40%.  Saw [facility].  Recommended f/u in 12 months.       Emphysema, unspecified ([place])    Recently diagnosed with flu.  Continue trelegy.  Prednisone taper as directed.  Follow.       Relevant Medications   predniSONE (DELTASONE) 10 MG tablet   History of colon polyps    Last colonoscopy 2018.  Multiple polyps.  Recommended f/u colonoscopy in 3 years.  Overdue.  Refer back to GI.  Previously saw Dr Tiffany Kocher.  Relevant Orders   Ambulatory referral to Gastroenterology   Influenza    Recently diagnosed with influenza.  Treated with tamiflu.  Persistent increased cough and congestion.  Some head congestion as well.  Saline nasal spray and steroid nasal spray as directed.  Prednisone taper as directed.  Continue trelegy.  Robitussin DM.  Follow.  Call with update.        Subclavian artery stenosis, left (HCC)    No arm pain.  Continue antiplatelet therapy.  Saw [facility] 01/2021.  Recommended f/u in one year.        Return in about 3 months (around 11/02/2021) for follow up appt (72min).   I discussed the assessment and treatment plan with the patient. The patient was provided an opportunity to ask questions and all were answered. The patient agreed with the plan and demonstrated an understanding of the instructions.   The patient was advised to call back or seek an in-person evaluation if the symptoms worsen or if the condition fails to improve as anticipated.  I provided 23 minutes of non-face-to-face time during this encounter.   Einar Pheasant, MD

## 2021-08-06 ENCOUNTER — Encounter: Payer: Self-pay | Admitting: Internal Medicine

## 2021-08-06 ENCOUNTER — Telehealth: Payer: Self-pay | Admitting: Internal Medicine

## 2021-08-06 DIAGNOSIS — J111 Influenza due to unidentified influenza virus with other respiratory manifestations: Secondary | ICD-10-CM | POA: Insufficient documentation

## 2021-08-06 DIAGNOSIS — Z86010 Personal history of colonic polyps: Secondary | ICD-10-CM | POA: Insufficient documentation

## 2021-08-06 DIAGNOSIS — E785 Hyperlipidemia, unspecified: Secondary | ICD-10-CM

## 2021-08-06 DIAGNOSIS — R739 Hyperglycemia, unspecified: Secondary | ICD-10-CM

## 2021-08-06 DIAGNOSIS — I10 Essential (primary) hypertension: Secondary | ICD-10-CM

## 2021-08-06 NOTE — Assessment & Plan Note (Signed)
Continue micardis.  Follow pressures.  

## 2021-08-06 NOTE — Addendum Note (Signed)
Addended by: Alisa Graff on: 08/06/2021 10:59 AM   Modules accepted: Orders

## 2021-08-06 NOTE — Assessment & Plan Note (Signed)
Carotid ultrasound <40%.  Saw [facility].  Recommended f/u in 12 months.  

## 2021-08-06 NOTE — Telephone Encounter (Signed)
Schedule for 3 month f/u appt and schedule fasting labs 1-2 days befor f/u appt.  Orders placed for labs.

## 2021-08-06 NOTE — Assessment & Plan Note (Signed)
Recently diagnosed with flu.  Continue trelegy.  Prednisone taper as directed.  Follow.

## 2021-08-06 NOTE — Assessment & Plan Note (Signed)
No arm pain.  Continue antiplatelet therapy.  Saw [facility] 01/2021.  Recommended f/u in one year.  

## 2021-08-06 NOTE — Assessment & Plan Note (Signed)
Recently diagnosed with influenza.  Treated with tamiflu.  Persistent increased cough and congestion.  Some head congestion as well.  Saline nasal spray and steroid nasal spray as directed.  Prednisone taper as directed.  Continue trelegy.  Robitussin DM.  Follow.  Call with update.

## 2021-08-06 NOTE — Assessment & Plan Note (Signed)
Last colonoscopy 2018.  Multiple polyps.  Recommended f/u colonoscopy in 3 years.  Overdue.  Refer back to GI.  Previously saw Dr Tiffany Kocher.

## 2021-08-24 ENCOUNTER — Other Ambulatory Visit: Payer: Self-pay

## 2021-08-24 ENCOUNTER — Other Ambulatory Visit (INDEPENDENT_AMBULATORY_CARE_PROVIDER_SITE_OTHER): Payer: PPO

## 2021-08-24 DIAGNOSIS — I10 Essential (primary) hypertension: Secondary | ICD-10-CM

## 2021-08-24 DIAGNOSIS — R739 Hyperglycemia, unspecified: Secondary | ICD-10-CM

## 2021-08-24 DIAGNOSIS — E785 Hyperlipidemia, unspecified: Secondary | ICD-10-CM | POA: Diagnosis not present

## 2021-08-24 LAB — HEPATIC FUNCTION PANEL
ALT: 13 U/L (ref 0–53)
AST: 15 U/L (ref 0–37)
Albumin: 4.3 g/dL (ref 3.5–5.2)
Alkaline Phosphatase: 63 U/L (ref 39–117)
Bilirubin, Direct: 0.2 mg/dL (ref 0.0–0.3)
Total Bilirubin: 0.8 mg/dL (ref 0.2–1.2)
Total Protein: 6.7 g/dL (ref 6.0–8.3)

## 2021-08-24 LAB — BASIC METABOLIC PANEL
BUN: 11 mg/dL (ref 6–23)
CO2: 25 mEq/L (ref 19–32)
Calcium: 9.5 mg/dL (ref 8.4–10.5)
Chloride: 97 mEq/L (ref 96–112)
Creatinine, Ser: 0.94 mg/dL (ref 0.40–1.50)
GFR: 76.86 mL/min (ref >60.00)
Glucose, Bld: 111 mg/dL — ABNORMAL HIGH (ref 70–99)
Potassium: 4.6 mEq/L (ref 3.5–5.1)
Sodium: 132 mEq/L — ABNORMAL LOW (ref 135–145)

## 2021-08-24 LAB — LIPID PANEL
Cholesterol: 133 mg/dL (ref 0–200)
HDL: 38.4 mg/dL — ABNORMAL LOW (ref >39.00)
LDL Cholesterol: 75 mg/dL (ref 0–99)
NonHDL: 94.68
Total CHOL/HDL Ratio: 3
Triglycerides: 96 mg/dL (ref 0.0–149.0)
VLDL: 19.2 mg/dL (ref 0.0–40.0)

## 2021-08-24 LAB — TSH: TSH: 1.91 u[IU]/mL (ref 0.35–5.50)

## 2021-08-24 LAB — HEMOGLOBIN A1C: Hgb A1c MFr Bld: 6.6 % — ABNORMAL HIGH (ref 4.6–6.5)

## 2021-08-28 ENCOUNTER — Other Ambulatory Visit: Payer: Self-pay | Admitting: Internal Medicine

## 2021-09-01 ENCOUNTER — Telehealth: Payer: Self-pay | Admitting: *Deleted

## 2021-09-01 DIAGNOSIS — E871 Hypo-osmolality and hyponatremia: Secondary | ICD-10-CM

## 2021-09-01 NOTE — Telephone Encounter (Signed)
Please place future orders for lab appt.  

## 2021-09-01 NOTE — Telephone Encounter (Signed)
Lab ordered.

## 2021-09-01 NOTE — Telephone Encounter (Signed)
See note - needs lab orders.

## 2021-09-05 ENCOUNTER — Other Ambulatory Visit: Payer: Self-pay

## 2021-09-05 ENCOUNTER — Other Ambulatory Visit (INDEPENDENT_AMBULATORY_CARE_PROVIDER_SITE_OTHER): Payer: PPO

## 2021-09-05 DIAGNOSIS — E871 Hypo-osmolality and hyponatremia: Secondary | ICD-10-CM | POA: Diagnosis not present

## 2021-09-05 LAB — SODIUM: Sodium: 132 mEq/L — ABNORMAL LOW (ref 135–145)

## 2021-09-10 ENCOUNTER — Other Ambulatory Visit: Payer: Self-pay | Admitting: Internal Medicine

## 2021-09-10 DIAGNOSIS — E871 Hypo-osmolality and hyponatremia: Secondary | ICD-10-CM

## 2021-09-10 NOTE — Progress Notes (Signed)
Orders placed for f/u labs.  

## 2021-09-11 ENCOUNTER — Telehealth: Payer: Self-pay

## 2021-09-11 NOTE — Telephone Encounter (Addendum)
LMTCB ok to give results and schedule lab appt  ----- Message from Einar Pheasant, MD sent at 09/10/2021  5:11 PM EST ----- Notify Mr Hetzer that his sodium level is stable.  Still slightly decreased but stable.  Would like to recheck and also check urine sodium - to see if can determine etiology.  Recheck non fasting lab in one week.  Labs ordered.

## 2021-09-18 ENCOUNTER — Other Ambulatory Visit: Payer: Self-pay

## 2021-09-18 ENCOUNTER — Other Ambulatory Visit (INDEPENDENT_AMBULATORY_CARE_PROVIDER_SITE_OTHER): Payer: PPO

## 2021-09-18 DIAGNOSIS — E871 Hypo-osmolality and hyponatremia: Secondary | ICD-10-CM | POA: Diagnosis not present

## 2021-09-18 LAB — BASIC METABOLIC PANEL
BUN: 13 mg/dL (ref 6–23)
CO2: 27 mEq/L (ref 19–32)
Calcium: 9.7 mg/dL (ref 8.4–10.5)
Chloride: 100 mEq/L (ref 96–112)
Creatinine, Ser: 0.91 mg/dL (ref 0.40–1.50)
GFR: 79.88 mL/min (ref >60.00)
Glucose, Bld: 89 mg/dL (ref 70–99)
Potassium: 4.5 mEq/L (ref 3.5–5.1)
Sodium: 133 mEq/L — ABNORMAL LOW (ref 135–145)

## 2021-09-20 LAB — OSMOLALITY: Osmolality: 281 mOsm/kg (ref 278–305)

## 2021-09-21 DIAGNOSIS — Z86010 Personal history of colonic polyps: Secondary | ICD-10-CM | POA: Diagnosis not present

## 2021-09-21 LAB — OSMOLALITY, URINE: Osmolality, Ur: 368 mOsm/kg (ref 50–1200)

## 2021-09-21 LAB — SODIUM, URINE, RANDOM: Sodium, Ur: 44 mmol/L (ref 28–272)

## 2021-09-26 ENCOUNTER — Other Ambulatory Visit: Payer: Self-pay | Admitting: Internal Medicine

## 2021-09-26 DIAGNOSIS — E871 Hypo-osmolality and hyponatremia: Secondary | ICD-10-CM

## 2021-09-26 NOTE — Progress Notes (Signed)
Order placed for f/u labs.  

## 2021-09-28 ENCOUNTER — Other Ambulatory Visit: Payer: Self-pay

## 2021-09-28 ENCOUNTER — Encounter: Payer: Self-pay | Admitting: Urology

## 2021-09-28 ENCOUNTER — Ambulatory Visit: Payer: PPO | Admitting: Urology

## 2021-09-28 VITALS — BP 153/74 | HR 87 | Ht 66.0 in | Wt 158.0 lb

## 2021-09-28 DIAGNOSIS — R310 Gross hematuria: Secondary | ICD-10-CM | POA: Diagnosis not present

## 2021-09-29 LAB — URINALYSIS, COMPLETE
Bilirubin, UA: NEGATIVE
Glucose, UA: NEGATIVE
Ketones, UA: NEGATIVE
Leukocytes,UA: NEGATIVE
Nitrite, UA: NEGATIVE
Protein,UA: NEGATIVE
Specific Gravity, UA: 1.01 (ref 1.005–1.030)
Urobilinogen, Ur: 0.2 mg/dL (ref 0.2–1.0)
pH, UA: 6.5 (ref 5.0–7.5)

## 2021-09-29 LAB — MICROSCOPIC EXAMINATION
Bacteria, UA: NONE SEEN
Epithelial Cells (non renal): NONE SEEN /hpf (ref 0–10)
WBC, UA: NONE SEEN /hpf (ref 0–5)

## 2021-09-29 NOTE — Progress Notes (Signed)
? ?09/28/2021 ?7:30 AM  ? ?James Peck ?[birth year] ?[id number] ? ?Referring provider: Einar Pheasant, MD ?[address] ?[address] ?[place],  [place] [address] ? ?Chief Complaint  ?Patient presents with  ? Hematuria  ? ? ?Urologic history: ?1.  Total gross painless hematuria ?July 2021 ?No upper tract abnormalities CTU ?Cystoscopy with prominent lateral lobe enlargement/hypervascularity ? ?HPI: ?81 y.o. male called for appointment for recurrent gross hematuria. ? ?2 weeks ago had recurrent gross hematuria ?Urine described as bloody with clots ?Episode lasted ~ 1 week ?Remains on Plavix and aspirin although he thinks his Plavix may be able to be discontinued within the next few weeks ?Had no bothersome LUTS ?No flank, abdominal or pelvic pain ? ? ?PMH: ?Past Medical History:  ?Diagnosis Date  ? Allergy   ? Arthritis   ? Coronary artery disease   ? GERD (gastroesophageal reflux disease)   ? History of hiatal hernia   ? Hypercholesteremia   ? Hypertension   ? ? ?Surgical History: ?Past Surgical History:  ?Procedure Laterality Date  ? CARDIAC CATHETERIZATION    ? cardiac stents    ? CATARACT EXTRACTION W/PHACO Right 09/10/2017  ? Procedure: CATARACT EXTRACTION PHACO AND INTRAOCULAR LENS PLACEMENT ([facility]);  Surgeon: Birder Robson, MD;  Location: [facility] ORS;  Service: Ophthalmology;  Laterality: Right;  [place] 01:01.7 ?AP% 15.3 ?CDE 9.38 ?Fluid pack lot # [id number]  ? CATARACT EXTRACTION W/PHACO Left 10/02/2017  ? Procedure: CATARACT EXTRACTION PHACO AND INTRAOCULAR LENS PLACEMENT ([facility]);  Surgeon: Birder Robson, MD;  Location: [facility] ORS;  Service: Ophthalmology;  Laterality: Left;  [place] 00:42.3 ?AP% 16.2 ?CDE 6.85 ?Fluid Pack Lot # [id number]  ? COLONOSCOPY WITH PROPOFOL N/A 06/28/2017  ? Procedure: COLONOSCOPY WITH PROPOFOL;  Surgeon: Manya Silvas, MD;  Location: [facility] ENDOSCOPY;  Service: Endoscopy;  Laterality: N/A;  ? CORONARY ANGIOPLASTY    ? STENTS X 2  ? KNEE ARTHROSCOPY    ? LEFT HEART CATH AND CORONARY  ANGIOGRAPHY N/A 09/12/2020  ? Procedure: LEFT HEART CATH AND CORONARY ANGIOGRAPHY;  Surgeon: Isaias Cowman, MD;  Location: [facility];  Service: Cardiovascular;  Laterality: N/A;  ? ? ?Home Medications:  ?Allergies as of 09/28/2021   ? ?   Reactions  ? Ace Inhibitors Cough  ? Cough ?Cough  ? Enalapril Cough  ? ?  ? ?  ?Medication List  ?  ? ?  ? Accurate as of September 28, 2021 11:59 PM. If you have any questions, ask your nurse or doctor.  ?  ?  ? ?  ? ?albuterol 108 (90 Base) MCG/ACT inhaler ?Commonly known as: VENTOLIN HFA ?Inhale 1-2 puffs into the lungs every 6 (six) hours as needed for wheezing or shortness of breath. ?  ?aspirin EC 81 MG tablet ?Take 81 mg by mouth daily. ?  ?BLACK CHERRY CONCENTRATE PO ?Take 1,000 mg daily by mouth. ?  ?calcium carbonate 1250 (500 Ca) MG tablet ?Commonly known as: OS-CAL - dosed in mg of elemental calcium ?Take 1 tablet by mouth daily. ?  ?cetirizine 10 MG tablet ?Commonly known as: ZYRTEC ?TAKE 1 TABLET BY MOUTH DAILY ?  ?chlorpheniramine 4 MG tablet ?Commonly known as: CHLOR-TRIMETON ?Take 4 mg by mouth 2 (two) times daily as needed for allergies. ?  ?CINNAMON PO ?Take 1,200 mg by mouth 2 times daily at 12 noon and 4 pm. ?  ?clopidogrel 75 MG tablet ?Commonly known as: PLAVIX ?Take 1 tablet (75 mg total) by mouth daily. ?  ?dextromethorphan-guaiFENesin 30-600 MG 12hr tablet ?  Commonly known as: [place] DM ?Take 1 tablet by mouth 2 (two) times daily. ?  ?famotidine 20 MG tablet ?Commonly known as: PEPCID ?TAKE 1 TABLET BY MOUTH DAILY ?  ?Fish Oil 1000 MG Caps ?Take 1,000 mg by mouth daily. ?  ?fluticasone 50 MCG/ACT nasal spray ?Commonly known as: FLONASE ?TAKE 2 SPRAYS INTO BOTH NOSTRILS DAILY ?  ?GLUCOSAMINE-CHONDROITIN PO ?Take 1 capsule by mouth daily. ?  ?nitroGLYCERIN 0.4 MG SL tablet ?Commonly known as: NITROSTAT ?Place 1 tablet (0.4 mg total) under the tongue every 5 (five) minutes as needed for chest pain. ?  ?predniSONE 10 MG tablet ?Commonly known as:  DELTASONE ?Take 4 tablets x 1 day and then decrease by 1/2 tablet per day until down to zero mg. ?  ?rosuvastatin 20 MG tablet ?Commonly known as: CRESTOR ?Take 1 tablet (20 mg total) by mouth daily. ?  ?sildenafil 20 MG tablet ?Commonly known as: REVATIO ?TAKE 1 TO 2 TABLETS BY MOUTH EVERY DAY AS NEEDED ?  ?telmisartan 20 MG tablet ?Commonly known as: MICARDIS ?TAKE ONE TABLET (20 MG) BY MOUTH EVERY DAY ?  ?Trelegy Ellipta 100-62.5-25 MCG/ACT Aepb ?Generic drug: Fluticasone-Umeclidin-Vilant ?Inhale 1 puff into the lungs daily. ?  ? ?  ? ? ?Allergies:  ?Allergies  ?Allergen Reactions  ? Ace Inhibitors Cough  ?  Cough ?Cough  ? Enalapril Cough  ? ? ?Family History: ?Family History  ?Problem Relation Age of Onset  ? Heart disease Mother   ? Diabetes Mother   ? Heart disease Father   ? Diabetes Father   ? Alzheimer's disease Brother   ? Stroke Brother   ? ? ?Social History:  reports that he quit smoking about 23 years ago. His smoking use included cigarettes. He has a 40.00 pack-year smoking history. He has never used smokeless tobacco. He reports that he does not drink alcohol and does not use drugs. ? ? ?Physical Exam: ?BP (!) 153/74   Pulse 87   Ht 5\' 6"  (1.676 m)   Wt 158 lb (71.7 kg)   BMI 25.50 kg/m?   ?Constitutional:  Alert and oriented, No acute distress. ?HEENT: [place] AT, moist mucus membranes.  Trachea midline, no masses. ?Respiratory: Normal respiratory effort, no increased work of breathing. ?Psychiatric: Normal mood and affect. ? ?Laboratory Data: ? ?Urinalysis ?Dipstick 2+ blood/microscopy negative ? ? ? ?Assessment & Plan:   ? ?1. Gross hematuria ?Recurrent gross hematuria ?Most likely secondary to BPH/Plavix ?Recommend reevaluation with CT urogram and cystoscopy ?CTU order placed and cystoscopy scheduled ?All questions were answered  ? ? ?Abbie Sons, MD ? ?[place] ?[address] ?[place], [place] [address] ?[phone] ? ?

## 2021-10-01 ENCOUNTER — Encounter: Payer: Self-pay | Admitting: Urology

## 2021-10-01 ENCOUNTER — Encounter: Payer: Self-pay | Admitting: Internal Medicine

## 2021-10-01 DIAGNOSIS — R319 Hematuria, unspecified: Secondary | ICD-10-CM | POA: Insufficient documentation

## 2021-10-02 DIAGNOSIS — I25119 Atherosclerotic heart disease of native coronary artery with unspecified angina pectoris: Secondary | ICD-10-CM | POA: Diagnosis not present

## 2021-10-02 DIAGNOSIS — E782 Mixed hyperlipidemia: Secondary | ICD-10-CM | POA: Diagnosis not present

## 2021-10-02 DIAGNOSIS — I2510 Atherosclerotic heart disease of native coronary artery without angina pectoris: Secondary | ICD-10-CM | POA: Diagnosis not present

## 2021-10-02 DIAGNOSIS — Z01818 Encounter for other preprocedural examination: Secondary | ICD-10-CM | POA: Diagnosis not present

## 2021-10-02 DIAGNOSIS — I10 Essential (primary) hypertension: Secondary | ICD-10-CM | POA: Diagnosis not present

## 2021-10-17 ENCOUNTER — Other Ambulatory Visit: Payer: Self-pay

## 2021-10-17 ENCOUNTER — Ambulatory Visit
Admission: RE | Admit: 2021-10-17 | Discharge: 2021-10-17 | Disposition: A | Discharge status: Home / Self Care | Payer: PPO | Source: Ambulatory Visit | Attending: Urology | Admitting: Urology

## 2021-10-17 DIAGNOSIS — R310 Gross hematuria: Secondary | ICD-10-CM | POA: Diagnosis not present

## 2021-10-17 DIAGNOSIS — K5730 Diverticulosis of large intestine without perforation or abscess without bleeding: Secondary | ICD-10-CM | POA: Diagnosis not present

## 2021-10-17 DIAGNOSIS — N281 Cyst of kidney, acquired: Secondary | ICD-10-CM | POA: Diagnosis not present

## 2021-10-17 MED ORDER — IOHEXOL 300 MG/ML  SOLN
100.0000 mL | Freq: Once | INTRAMUSCULAR | Status: AC | PRN
  Administered 2021-10-17: 100 mL via INTRAVENOUS

## 2021-10-23 ENCOUNTER — Other Ambulatory Visit: Payer: Self-pay | Admitting: Internal Medicine

## 2021-10-30 ENCOUNTER — Ambulatory Visit: Payer: PPO | Admitting: Urology

## 2021-10-30 ENCOUNTER — Encounter: Payer: Self-pay | Admitting: Urology

## 2021-10-30 VITALS — BP 130/72 | HR 79 | Ht 66.0 in | Wt 155.0 lb

## 2021-10-30 DIAGNOSIS — R310 Gross hematuria: Secondary | ICD-10-CM | POA: Diagnosis not present

## 2021-10-30 LAB — MICROSCOPIC EXAMINATION: Bacteria, UA: NONE SEEN

## 2021-10-30 LAB — URINALYSIS, COMPLETE
Bilirubin, UA: NEGATIVE
Glucose, UA: NEGATIVE
Ketones, UA: NEGATIVE
Leukocytes,UA: NEGATIVE
Nitrite, UA: NEGATIVE
Protein,UA: NEGATIVE
Specific Gravity, UA: 1.01 (ref 1.005–1.030)
Urobilinogen, Ur: 0.2 mg/dL (ref 0.2–1.0)
pH, UA: 7 (ref 5.0–7.5)

## 2021-10-30 MED ORDER — FINASTERIDE 5 MG PO TABS: 5.0000 mg | ORAL_TABLET | Freq: Every day | ORAL | 3 refills | Status: DC

## 2021-11-05 ENCOUNTER — Encounter: Payer: Self-pay | Admitting: Urology

## 2021-11-05 NOTE — Progress Notes (Signed)
? ?  11/05/21 ? ?CC:  ?Chief Complaint  ?Patient presents with  ? Cysto  ? ?Urologic history: ?1.  Total gross painless hematuria ?July 2021 ?No upper tract abnormalities CTU ?Cystoscopy with prominent lateral lobe enlargement/hypervascularity ? ? ?HPI: Recurrent gross hematuria last month.  No recurrence since his last visit ? ?Blood pressure 130/72, pulse 79, height '5\' 6"'$  (1.676 m), weight 155 lb (70.3 kg). ?NED. A&Ox3.   ?No respiratory distress   ?Abd soft, NT, ND ?Normal phallus with bilateral descended testicles ? ?Cystoscopy Procedure Note ? ?Patient identification was confirmed, informed consent was obtained, and patient was prepped using Betadine solution.  Lidocaine jelly was administered per urethral meatus.   ? ? ?Pre-Procedure: ?- Inspection reveals a normal caliber urethral meatus. ? ?Procedure: ?The flexible cystoscope was introduced without difficulty ?- No urethral strictures/lesions are present. ?-  Prominent lateral lobe enlargement with hypervascularity  prostate  ?- Normal bladder neck ?- Bilateral ureteral orifices identified ?- Bladder mucosa  reveals no ulcers, tumors, or lesions ?- No bladder stones ?- Mild to moderate trabeculation ? ?Retroflexion shows no tumor or intravesical median lobe ? ? ?Post-Procedure: ?- Patient tolerated the procedure well ? ?Assessment/ Plan: ?No bladder mucosal abnormalities ?Hematuria most likely secondary to BPH  ?Start finasteride 5 mg daily ?Follow-up 1 year or earlier for recurrent hematuria ? ? ? ?Abbie Sons, MD ? ?

## 2021-11-20 ENCOUNTER — Encounter: Payer: Self-pay | Admitting: Internal Medicine

## 2021-11-20 ENCOUNTER — Ambulatory Visit (INDEPENDENT_AMBULATORY_CARE_PROVIDER_SITE_OTHER): Payer: PPO | Admitting: Internal Medicine

## 2021-11-20 DIAGNOSIS — J439 Emphysema, unspecified: Secondary | ICD-10-CM

## 2021-11-20 DIAGNOSIS — I771 Stricture of artery: Secondary | ICD-10-CM | POA: Diagnosis not present

## 2021-11-20 DIAGNOSIS — I10 Essential (primary) hypertension: Secondary | ICD-10-CM | POA: Diagnosis not present

## 2021-11-20 DIAGNOSIS — R739 Hyperglycemia, unspecified: Secondary | ICD-10-CM | POA: Diagnosis not present

## 2021-11-20 DIAGNOSIS — E785 Hyperlipidemia, unspecified: Secondary | ICD-10-CM

## 2021-11-20 DIAGNOSIS — R935 Abnormal findings on diagnostic imaging of other abdominal regions, including retroperitoneum: Secondary | ICD-10-CM

## 2021-11-20 DIAGNOSIS — R319 Hematuria, unspecified: Secondary | ICD-10-CM | POA: Diagnosis not present

## 2021-11-20 DIAGNOSIS — I779 Disorder of arteries and arterioles, unspecified: Secondary | ICD-10-CM

## 2021-11-20 DIAGNOSIS — Z86010 Personal history of colonic polyps: Secondary | ICD-10-CM

## 2021-11-20 DIAGNOSIS — I2510 Atherosclerotic heart disease of native coronary artery without angina pectoris: Secondary | ICD-10-CM

## 2021-11-20 NOTE — Progress Notes (Signed)
Patient ID: James Peck, male   DOB: 1941/05/15, 81 y.o.   MRN: [id number] ? ? ?Subjective:  ? ? Patient ID: James Peck, male    DOB: 03/04/1941, 81 y.o.   MRN: [id number] ? ?This visit occurred during the SARS-CoV-2 public health emergency.  Safety protocols were in place, including screening questions prior to the visit, additional usage of staff PPE, and extensive cleaning of exam room while observing appropriate contact time as indicated for disinfecting solutions.  ? ?Patient here for a scheduled follow up.  ? ?HPI ?Here to follow up regarding his blood pressure, cholesterol and CAD.  Recently saw cardiology.  Felt stable.  Recommended to continue aspirin, plavix and crestor.  Blood pressure ok.  No chest pain.  Breathing stable.  Continues trelegy, combivent.  No increased cough and congestion.  No acid reflux reported.  No abdominal pain.  Bowels moving.   ? ? ?Past Medical History:  ?Diagnosis Date  ? Allergy   ? Arthritis   ? Coronary artery disease   ? GERD (gastroesophageal reflux disease)   ? History of hiatal hernia   ? Hypercholesteremia   ? Hypertension   ? ?Past Surgical History:  ?Procedure Laterality Date  ? CARDIAC CATHETERIZATION    ? cardiac stents    ? CATARACT EXTRACTION W/PHACO Right 09/10/2017  ? Procedure: CATARACT EXTRACTION PHACO AND INTRAOCULAR LENS PLACEMENT (IOC);  Surgeon: Birder Robson, MD;  Location: [facility] ORS;  Service: Ophthalmology;  Laterality: Right;  [place] 01:01.7 ?AP% 15.3 ?CDE 9.38 ?Fluid pack lot # [id number]  ? CATARACT EXTRACTION W/PHACO Left 10/02/2017  ? Procedure: CATARACT EXTRACTION PHACO AND INTRAOCULAR LENS PLACEMENT (IOC);  Surgeon: Birder Robson, MD;  Location: [facility] ORS;  Service: Ophthalmology;  Laterality: Left;  [place] 00:42.3 ?AP% 16.2 ?CDE 6.85 ?Fluid Pack Lot # [id number]  ? COLONOSCOPY WITH PROPOFOL N/A 06/28/2017  ? Procedure: COLONOSCOPY WITH PROPOFOL;  Surgeon: Manya Silvas, MD;  Location: [facility] ENDOSCOPY;  Service: Endoscopy;  Laterality: N/A;  ? CORONARY  ANGIOPLASTY    ? STENTS X 2  ? KNEE ARTHROSCOPY    ? LEFT HEART CATH AND CORONARY ANGIOGRAPHY N/A 09/12/2020  ? Procedure: LEFT HEART CATH AND CORONARY ANGIOGRAPHY;  Surgeon: Isaias Cowman, MD;  Location: [facility];  Service: Cardiovascular;  Laterality: N/A;  ? ?Family History  ?Problem Relation Age of Onset  ? Heart disease Mother   ? Diabetes Mother   ? Heart disease Father   ? Diabetes Father   ? Alzheimer's disease Brother   ? Stroke Brother   ? ?Social History  ? ?Socioeconomic History  ? Marital status: Married  ?  Spouse name: Not on file  ? Number of children: Not on file  ? Years of education: Not on file  ? Highest education level: Not on file  ?Occupational History  ? Not on file  ?Tobacco Use  ? Smoking status: Former  ?  Packs/day: 1.00  ?  Years: 40.00  ?  Pack years: 40.00  ?  Types: Cigarettes  ?  Quit date: 2000  ?  Years since quitting: 23.3  ? Smokeless tobacco: Never  ?Vaping Use  ? Vaping Use: Never used  ?Substance and Sexual Activity  ? Alcohol use: No  ? Drug use: No  ? Sexual activity: Not on file  ?Other Topics Concern  ? Not on file  ?Social History Narrative  ? Not on file  ? ?Social Determinants of Health  ? ?Financial Resource Strain: Low Risk   ?  Difficulty of Paying Living Expenses: Not hard at all  ?Food Insecurity: No Food Insecurity  ? Worried About Charity fundraiser in the Last Year: Never true  ? Ran Out of Food in the Last Year: Never true  ?Transportation Needs: No Transportation Needs  ? Lack of Transportation (Medical): No  ? Lack of Transportation (Non-Medical): No  ?Physical Activity: Sufficiently Active  ? Days of Exercise per Week: 5 days  ? Minutes of Exercise per Session: 30 min  ?Stress: No Stress Concern Present  ? Feeling of Stress : Not at all  ?Social Connections: Unknown  ? Frequency of Communication with Friends and Family: Not on file  ? Frequency of Social Gatherings with Friends and Family: Not on file  ? Attends Religious Services: Not on  file  ? Active Member of Clubs or Organizations: Not on file  ? Attends Archivist Meetings: Not on file  ? Marital Status: Married  ? ? ? ?Review of Systems  ?Constitutional:  Negative for appetite change and unexpected weight change.  ?HENT:  Negative for congestion and sinus pressure.   ?Respiratory:  Negative for cough, chest tightness and shortness of breath.   ?Cardiovascular:  Negative for chest pain, palpitations and leg swelling.  ?Gastrointestinal:  Negative for abdominal pain, diarrhea, nausea and vomiting.  ?Genitourinary:  Negative for difficulty urinating and dysuria.  ?Musculoskeletal:  Negative for joint swelling and myalgias.  ?Skin:  Negative for color change and rash.  ?Neurological:  Negative for dizziness, light-headedness and headaches.  ?Psychiatric/Behavioral:  Negative for agitation and dysphoric mood.   ? ?   ?Objective:  ?  ? ?BP 124/62 (BP Location: Left Arm, Patient Position: Sitting, Cuff Size: Large)   Pulse 75   Temp 97.7 ?F (36.5 ?C) (Oral)   Ht '5\' 6"'$  (1.676 m)   Wt 161 lb (73 kg)   SpO2 96%   BMI 25.99 kg/m?  ?Wt Readings from Last 3 Encounters:  ?11/20/21 161 lb (73 kg)  ?10/30/21 155 lb (70.3 kg)  ?09/28/21 158 lb (71.7 kg)  ? ? ?Physical Exam ?Constitutional:   ?   General: He is not in acute distress. ?   Appearance: Normal appearance. He is well-developed.  ?HENT:  ?   Head: Normocephalic and atraumatic.  ?   Right Ear: External ear normal.  ?   Left Ear: External ear normal.  ?Eyes:  ?   General: No scleral icterus.    ?   Right eye: No discharge.     ?   Left eye: No discharge.  ?Cardiovascular:  ?   Rate and Rhythm: Normal rate and regular rhythm.  ?Pulmonary:  ?   Effort: Pulmonary effort is normal. No respiratory distress.  ?   Breath sounds: Normal breath sounds.  ?Abdominal:  ?   General: Bowel sounds are normal.  ?   Palpations: Abdomen is soft.  ?   Tenderness: There is no abdominal tenderness.  ?Musculoskeletal:     ?   General: No swelling or  tenderness.  ?   Cervical back: Neck supple. No tenderness.  ?Lymphadenopathy:  ?   Cervical: No cervical adenopathy.  ?Skin: ?   Findings: No erythema or rash.  ?Neurological:  ?   Mental Status: He is alert.  ?Psychiatric:     ?   Mood and Affect: Mood normal.     ?   Behavior: Behavior normal.  ? ? ? ?Outpatient Encounter Medications as of 11/20/2021  ?Medication Sig  ?  aspirin EC 81 MG tablet Take 81 mg by mouth daily.   ? calcium carbonate (OS-CAL - DOSED IN MG OF ELEMENTAL CALCIUM) 1250 (500 CA) MG tablet Take 1 tablet by mouth daily.   ? cetirizine (ZYRTEC) 10 MG tablet TAKE 1 TABLET BY MOUTH DAILY  ? chlorpheniramine (CHLOR-TRIMETON) 4 MG tablet Take 4 mg by mouth 2 (two) times daily as needed for allergies.  ? CINNAMON PO Take 1,200 mg by mouth 2 times daily at 12 noon and 4 pm.  ? clopidogrel (PLAVIX) 75 MG tablet Take 1 tablet (75 mg total) by mouth daily.  ? dextromethorphan-guaiFENesin (MUCINEX DM) 30-600 MG 12hr tablet Take 1 tablet by mouth 2 (two) times daily.  ? famotidine (PEPCID) 20 MG tablet TAKE 1 TABLET BY MOUTH DAILY  ? finasteride (PROSCAR) 5 MG tablet Take 1 tablet (5 mg total) by mouth daily.  ? fluticasone (FLONASE) 50 MCG/ACT nasal spray TAKE 2 SPRAYS INTO BOTH NOSTRILS DAILY  ? Fluticasone-Umeclidin-Vilant (TRELEGY ELLIPTA) 100-62.5-25 MCG/ACT AEPB Inhale 1 puff into the lungs daily.  ? GLUCOSAMINE-CHONDROITIN PO Take 1 capsule by mouth daily.  ? Misc Natural Products (BLACK CHERRY CONCENTRATE PO) Take 1,000 mg daily by mouth.  ? nitroGLYCERIN (NITROSTAT) 0.4 MG SL tablet Place 1 tablet (0.4 mg total) under the tongue every 5 (five) minutes as needed for chest pain.  ? Omega-3 Fatty Acids (FISH OIL) 1000 MG CAPS Take 1,000 mg by mouth daily.   ? rosuvastatin (CRESTOR) 20 MG tablet Take 1 tablet (20 mg total) by mouth daily.  ? sildenafil (REVATIO) 20 MG tablet TAKE 1 TO 2 TABLETS BY MOUTH EVERY DAY AS NEEDED  ? telmisartan (MICARDIS) 20 MG tablet TAKE ONE TABLET (20 MG) BY MOUTH EVERY DAY   ? [DISCONTINUED] albuterol (VENTOLIN HFA) 108 (90 Base) MCG/ACT inhaler Inhale 1-2 puffs into the lungs every 6 (six) hours as needed for wheezing or shortness of breath. (Patient not taking: Reported on 11/20/2021

## 2021-11-26 ENCOUNTER — Encounter: Payer: Self-pay | Admitting: Internal Medicine

## 2021-11-26 NOTE — Assessment & Plan Note (Signed)
Evaluated by Dr Bernardo Heater - 09/2021 - recommended CT urogram and cystoscopy.  Cysto (10/30/21) - No bladder mucosal abnormalities. Hematuria most likely secondary to BPH. Start finasteride 5 mg daily. Follow-up 1 year or earlier for recurrent hematuria ?

## 2021-11-26 NOTE — Assessment & Plan Note (Signed)
Low carb diet and exercise. Follow met b and a1c.   ?Lab Results  ?Component Value Date  ? HGBA1C 6.6 (H) 08/24/2021  ? ?

## 2021-11-26 NOTE — Assessment & Plan Note (Signed)
Continues trelegy, combivent.  No increased cough and congestion.   ?

## 2021-11-26 NOTE — Assessment & Plan Note (Signed)
Previously admitted with NSTEMI 08/2020.  09/12/20 - cath (50% mid/distal LAD).  No chest pain now.  Followed by cardiolofy.  Continue micardis.  Continue crestor.  ?

## 2021-11-26 NOTE — Assessment & Plan Note (Signed)
Carotid ultrasound <40%.  Saw [facility].  Recommended f/u in 12 months.  

## 2021-11-26 NOTE — Assessment & Plan Note (Signed)
Continue micardis.  Follow pressures.  

## 2021-11-26 NOTE — Assessment & Plan Note (Signed)
On crestor.  Low cholesterol diet and exercise.  Follow lipid panel and liver function tests.   

## 2021-11-26 NOTE — Assessment & Plan Note (Signed)
Last colonoscopy 2018.  Multiple polyps.  Scheduled for f/u colonoscopy.  ?

## 2021-11-26 NOTE — Assessment & Plan Note (Signed)
No arm pain.  Continue antiplatelet therapy.  Saw [facility] 01/2021.  Recommended f/u in one year.  

## 2021-11-26 NOTE — Assessment & Plan Note (Signed)
Previous bronchial wall thickening and changes.  Saw pulmonary.  Breathing stable.  See pulmonary note.  Follow.  ?

## 2021-11-27 ENCOUNTER — Other Ambulatory Visit: Payer: Self-pay | Admitting: Urology

## 2021-11-27 ENCOUNTER — Other Ambulatory Visit: Payer: Self-pay | Admitting: Internal Medicine

## 2021-12-11 ENCOUNTER — Encounter: Payer: Self-pay | Admitting: *Deleted

## 2021-12-12 ENCOUNTER — Encounter: Payer: Self-pay | Admitting: *Deleted

## 2021-12-12 ENCOUNTER — Other Ambulatory Visit: Payer: PPO

## 2021-12-12 ENCOUNTER — Ambulatory Visit: Payer: PPO | Admitting: Anesthesiology

## 2021-12-12 ENCOUNTER — Encounter
Admission: RE | Disposition: A | Discharge status: Home / Self Care | Payer: Self-pay | Source: Home / Self Care | Attending: Gastroenterology

## 2021-12-12 ENCOUNTER — Ambulatory Visit
Admission: RE | Admit: 2021-12-12 | Discharge: 2021-12-12 | Disposition: A | Discharge status: Home / Self Care | Payer: PPO | Attending: Gastroenterology | Admitting: Gastroenterology

## 2021-12-12 DIAGNOSIS — Z1211 Encounter for screening for malignant neoplasm of colon: Secondary | ICD-10-CM | POA: Diagnosis not present

## 2021-12-12 DIAGNOSIS — D128 Benign neoplasm of rectum: Secondary | ICD-10-CM | POA: Insufficient documentation

## 2021-12-12 DIAGNOSIS — I2510 Atherosclerotic heart disease of native coronary artery without angina pectoris: Secondary | ICD-10-CM | POA: Diagnosis not present

## 2021-12-12 DIAGNOSIS — K649 Unspecified hemorrhoids: Secondary | ICD-10-CM | POA: Diagnosis not present

## 2021-12-12 DIAGNOSIS — Z87891 Personal history of nicotine dependence: Secondary | ICD-10-CM | POA: Diagnosis not present

## 2021-12-12 DIAGNOSIS — K449 Diaphragmatic hernia without obstruction or gangrene: Secondary | ICD-10-CM | POA: Insufficient documentation

## 2021-12-12 DIAGNOSIS — E7800 Pure hypercholesterolemia, unspecified: Secondary | ICD-10-CM | POA: Insufficient documentation

## 2021-12-12 DIAGNOSIS — I252 Old myocardial infarction: Secondary | ICD-10-CM | POA: Insufficient documentation

## 2021-12-12 DIAGNOSIS — D122 Benign neoplasm of ascending colon: Secondary | ICD-10-CM | POA: Diagnosis not present

## 2021-12-12 DIAGNOSIS — K640 First degree hemorrhoids: Secondary | ICD-10-CM | POA: Diagnosis not present

## 2021-12-12 DIAGNOSIS — K635 Polyp of colon: Secondary | ICD-10-CM | POA: Diagnosis not present

## 2021-12-12 DIAGNOSIS — E785 Hyperlipidemia, unspecified: Secondary | ICD-10-CM | POA: Insufficient documentation

## 2021-12-12 DIAGNOSIS — K5730 Diverticulosis of large intestine without perforation or abscess without bleeding: Secondary | ICD-10-CM | POA: Diagnosis not present

## 2021-12-12 DIAGNOSIS — J439 Emphysema, unspecified: Secondary | ICD-10-CM | POA: Insufficient documentation

## 2021-12-12 DIAGNOSIS — K219 Gastro-esophageal reflux disease without esophagitis: Secondary | ICD-10-CM | POA: Insufficient documentation

## 2021-12-12 DIAGNOSIS — I10 Essential (primary) hypertension: Secondary | ICD-10-CM | POA: Diagnosis not present

## 2021-12-12 DIAGNOSIS — Z79899 Other long term (current) drug therapy: Secondary | ICD-10-CM | POA: Insufficient documentation

## 2021-12-12 DIAGNOSIS — Z86010 Personal history of colonic polyps: Secondary | ICD-10-CM | POA: Insufficient documentation

## 2021-12-12 HISTORY — PX: COLONOSCOPY WITH PROPOFOL: SHX5780

## 2021-12-12 SURGERY — COLONOSCOPY WITH PROPOFOL
Anesthesia: General

## 2021-12-12 MED ORDER — PROPOFOL 10 MG/ML IV BOLUS
INTRAVENOUS | Status: DC | PRN
  Administered 2021-12-12: 30 mg via INTRAVENOUS
  Administered 2021-12-12: 80 mg via INTRAVENOUS
  Administered 2021-12-12: 30 mg via INTRAVENOUS

## 2021-12-12 MED ORDER — LIDOCAINE HCL (CARDIAC) PF 100 MG/5ML IV SOSY
PREFILLED_SYRINGE | INTRAVENOUS | Status: DC | PRN
  Administered 2021-12-12: 80 mg via INTRAVENOUS

## 2021-12-12 MED ORDER — SODIUM CHLORIDE 0.9 % IV SOLN: INTRAVENOUS | Status: DC

## 2021-12-12 MED ORDER — PROPOFOL 500 MG/50ML IV EMUL
INTRAVENOUS | Status: DC | PRN
  Administered 2021-12-12: 100 ug/kg/min via INTRAVENOUS

## 2021-12-12 NOTE — Anesthesia Postprocedure Evaluation (Signed)
Anesthesia Post Note ? ?Patient: James Peck ? ?Procedure(s) Performed: COLONOSCOPY WITH PROPOFOL ? ?Patient location during evaluation: Endoscopy ?Anesthesia Type: General ?Level of consciousness: awake and alert ?Pain management: pain level controlled ?Vital Signs Assessment: post-procedure vital signs reviewed and stable ?Respiratory status: spontaneous breathing, nonlabored ventilation and respiratory function stable ?Cardiovascular status: blood pressure returned to baseline and stable ?Postop Assessment: no apparent nausea or vomiting ?Anesthetic complications: no ? ? ?No notable events documented. ? ? ?Last Vitals:  ?Vitals:  ? 12/12/21 1127 12/12/21 1137  ?BP: 110/76 134/71  ?Pulse:    ?Resp:    ?Temp:    ?SpO2:    ?  ?Last Pain:  ?Vitals:  ? 12/12/21 1137  ?TempSrc:   ?PainSc: 0-No pain  ? ? ?  ?  ?  ?  ?  ?  ? ?[place] Ouch ? ? ? ? ?

## 2021-12-12 NOTE — Transfer of Care (Signed)
Immediate Anesthesia Transfer of Care Note ? ?Patient: James Peck ? ?Procedure(s) Performed: COLONOSCOPY WITH PROPOFOL ? ?Patient Location: PACU and Endoscopy Unit ? ?Anesthesia Type:General ? ?Level of Consciousness: awake, drowsy and patient cooperative ? ?Airway & Oxygen Therapy: Patient Spontanous Breathing ? ?Post-op Assessment: Report given to RN, Post -op Vital signs reviewed and stable and Patient moving all extremities ? ?Post vital signs: Reviewed and stable ? ?Last Vitals:  ?Vitals Value Taken Time  ?BP 118/67 12/12/21 1117  ?Temp    ?Pulse 62 12/12/21 1118  ?Resp 19 12/12/21 1118  ?SpO2 97 % 12/12/21 1118  ?Vitals shown include unvalidated device data. ? ?Last Pain:  ?Vitals:  ? 12/12/21 1019  ?TempSrc: Temporal  ?   ? ?  ? ?Complications: No notable events documented. ?

## 2021-12-12 NOTE — H&P (Signed)
Outpatient short stay form Pre-procedure ?12/12/2021  ?Lesly Rubenstein, MD ? ?Primary Physician: Einar Pheasant, MD ? ?Reason for visit:  Surveillance colonoscopy ? ?History of present illness:   ? ?81 y/o gentleman with history of CAD on plavix (last dose 5 days ago), hypertension, and HLD here for surveillance colonoscopy. Last colonoscopy in 2018 with > 3 small Ta's. No significant abdominal surgeries. No family history of GI malignancies. ? ? ? ?Current Facility-Administered Medications:  ?  0.9 %  sodium chloride infusion, , Intravenous, Continuous, Samora Jernberg, Hilton Cork, MD, Last Rate: 20 mL/hr at 12/12/21 1031, New Bag at 12/12/21 1031 ? ?Medications Prior to Admission  ?Medication Sig Dispense Refill Last Dose  ? calcium carbonate (OS-CAL - DOSED IN MG OF ELEMENTAL CALCIUM) 1250 (500 CA) MG tablet Take 1 tablet by mouth daily.    Past Week  ? chlorpheniramine (CHLOR-TRIMETON) 4 MG tablet Take 4 mg by mouth 2 (two) times daily as needed for allergies.   Past Week  ? famotidine (PEPCID) 20 MG tablet TAKE 1 TABLET BY MOUTH DAILY 90 tablet 1 Past Week  ? finasteride (PROSCAR) 5 MG tablet Take 1 tablet (5 mg total) by mouth daily. 30 tablet 3 12/11/2021  ? fluticasone (FLONASE) 50 MCG/ACT nasal spray TAKE 2 SPRAYS INTO BOTH NOSTRILS DAILY 16 g 3 12/11/2021  ? Fluticasone-Umeclidin-Vilant (TRELEGY ELLIPTA) 100-62.5-25 MCG/ACT AEPB Inhale 1 puff into the lungs daily. 60 each 0 12/12/2021  ? rosuvastatin (CRESTOR) 20 MG tablet Take 1 tablet (20 mg total) by mouth daily. 30 tablet 0 12/11/2021  ? telmisartan (MICARDIS) 20 MG tablet TAKE ONE TABLET (20 MG) BY MOUTH EVERY DAY 90 tablet 1 12/11/2021  ? aspirin EC 81 MG tablet Take 81 mg by mouth daily.    12/09/2021  ? cetirizine (ZYRTEC) 10 MG tablet TAKE 1 TABLET BY MOUTH DAILY 90 tablet 1   ? CINNAMON PO Take 1,200 mg by mouth 2 times daily at 12 noon and 4 pm.     ? clopidogrel (PLAVIX) 75 MG tablet Take 1 tablet (75 mg total) by mouth daily. 30 tablet 0 12/07/2021  ?  dextromethorphan-guaiFENesin (MUCINEX DM) 30-600 MG 12hr tablet Take 1 tablet by mouth 2 (two) times daily.     ? GLUCOSAMINE-CHONDROITIN PO Take 1 capsule by mouth daily.     ? Misc Natural Products (BLACK CHERRY CONCENTRATE PO) Take 1,000 mg daily by mouth.     ? nitroGLYCERIN (NITROSTAT) 0.4 MG SL tablet Place 1 tablet (0.4 mg total) under the tongue every 5 (five) minutes as needed for chest pain. 30 tablet 0   ? Omega-3 Fatty Acids (FISH OIL) 1000 MG CAPS Take 1,000 mg by mouth daily.      ? sildenafil (REVATIO) 20 MG tablet TAKE 1 TO 2 TABLETS BY MOUTH EVERY DAY AS NEEDED 60 tablet 0   ? ? ? ?Allergies  ?Allergen Reactions  ? Ace Inhibitors Cough  ?  Cough ?Cough  ? Enalapril Cough  ? ? ? ?Past Medical History:  ?Diagnosis Date  ? Allergy   ? Arthritis   ? Coronary artery disease   ? GERD (gastroesophageal reflux disease)   ? History of hiatal hernia   ? Hypercholesteremia   ? Hypertension   ? ? ?Review of systems:  Otherwise negative.  ? ? ?Physical Exam ? ?Gen: Alert, oriented. Appears stated age.  ?HEENT: PERRLA. ?Lungs: No respiratory distress ?CV: RRR ?Abd: soft, benign, no masses ?Ext: No edema ? ? ? ?Planned procedures: Proceed with EGD/colonoscopy.  The patient understands the nature of the planned procedure, indications, risks, alternatives and potential complications including but not limited to bleeding, infection, perforation, damage to internal organs and possible oversedation/side effects from anesthesia. The patient agrees and gives consent to proceed.  ?Please refer to procedure notes for findings, recommendations and patient disposition/instructions.  ? ? ? ?Lesly Rubenstein, MD ?[facility] ? ? ? ?  ?

## 2021-12-12 NOTE — Op Note (Signed)
[facility] ?Gastroenterology ?Patient Name: James Peck ?Procedure Date: 12/12/2021 10:34 AM ?MRN: [id number] ?Account #: [id number] ?Date of Birth: [birth year] ?Admit Type: Outpatient ?Age: 81 ?Room: [facility] ENDO ROOM 1 ?Gender: Male ?Note Status: Finalized ?Instrument Name: Colonoscope [id number] ?Procedure:             Colonoscopy ?Indications:           Surveillance: Personal history of adenomatous polyps  ?                       on last colonoscopy 5 years ago ?Providers:             Andrey Farmer MD, MD ?Medicines:             Monitored Anesthesia Care ?Complications:         No immediate complications. Estimated blood loss:  ?                       Minimal. ?Procedure:             Pre-Anesthesia Assessment: ?                       - Prior to the procedure, a History and Physical was  ?                       performed, and patient medications and allergies were  ?                       reviewed. The patient is competent. The risks and  ?                       benefits of the procedure and the sedation options and  ?                       risks were discussed with the patient. All questions  ?                       were answered and informed consent was obtained.  ?                       Patient identification and proposed procedure were  ?                       verified by the physician, the nurse, the  ?                       anesthesiologist, the anesthetist and the technician  ?                       in the endoscopy suite. Mental Status Examination:  ?                       alert and oriented. Airway Examination: normal  ?                       oropharyngeal airway and neck mobility. Respiratory  ?                       Examination: clear to auscultation. CV Examination:  ?  normal. Prophylactic Antibiotics: The patient does not  ?                       require prophylactic antibiotics. Prior  ?                       Anticoagulants: The patient has taken Plavix  ?                        (clopidogrel), last dose was 5 days prior to  ?                       procedure. ASA Grade Assessment: II - A patient with  ?                       mild systemic disease. After reviewing the risks and  ?                       benefits, the patient was deemed in satisfactory  ?                       condition to undergo the procedure. The anesthesia  ?                       plan was to use monitored anesthesia care (MAC).  ?                       Immediately prior to administration of medications,  ?                       the patient was re-assessed for adequacy to receive  ?                       sedatives. The heart rate, respiratory rate, oxygen  ?                       saturations, blood pressure, adequacy of pulmonary  ?                       ventilation, and response to care were monitored  ?                       throughout the procedure. The physical status of the  ?                       patient was re-assessed after the procedure. ?                       After obtaining informed consent, the colonoscope was  ?                       passed under direct vision. Throughout the procedure,  ?                       the patient's blood pressure, pulse, and oxygen  ?                       saturations were monitored continuously. The  ?  Colonoscope was introduced through the anus and  ?                       advanced to the the cecum, identified by appendiceal  ?                       orifice and ileocecal valve. The colonoscopy was  ?                       performed without difficulty. The patient tolerated  ?                       the procedure well. The quality of the bowel  ?                       preparation was good. ?Findings: ?     The perianal and digital rectal examinations were normal. ?     A 2 mm polyp was found in the ascending colon. The polyp was sessile.  ?     The polyp was removed with a jumbo cold forceps. Resection and retrieval  ?     were complete. Estimated  blood loss was minimal. ?     A 7 mm polyp was found in the ascending colon. The polyp was sessile.  ?     The polyp was removed with a cold snare. Resection and retrieval were  ?     complete. Estimated blood loss was minimal. ?     A 15 mm polyp was found in the proximal rectum. The polyp was  ?     pedunculated. The polyp was removed with a hot snare. Resection and  ?     retrieval were complete. Estimated blood loss was minimal. To prevent  ?     bleeding post-intervention, one hemostatic clip was successfully placed.  ?     There was no bleeding during, or at the end, of the procedure. ?     A few small-mouthed diverticula were found in the sigmoid colon. ?     Internal hemorrhoids were found during retroflexion. The hemorrhoids  ?     were Grade I (internal hemorrhoids that do not prolapse). ?     The exam was otherwise without abnormality on direct and retroflexion  ?     views. ?Impression:            - One 2 mm polyp in the ascending colon, removed with  ?                       a jumbo cold forceps. Resected and retrieved. ?                       - One 7 mm polyp in the ascending colon, removed with  ?                       a cold snare. Resected and retrieved. ?                       - One 15 mm polyp in the proximal rectum, removed with  ?                       a hot snare.  Resected and retrieved. Clip was placed. ?                       - Diverticulosis in the sigmoid colon. ?                       - Internal hemorrhoids. ?                       - The examination was otherwise normal on direct and  ?                       retroflexion views. ?Recommendation:        - Discharge patient to home. ?                       - Resume previous diet. ?                       - Continue present medications. ?                       - Resume Plavix (clopidogrel) at prior dose in 2 days. ?                       - Await pathology results. ?                       - Repeat colonoscopy is not recommended due to current  ?                        age (61 years or older) for surveillance. ?                       - Return to referring physician as previously  ?                       scheduled. ?Procedure Code(s):     --- Professional --- ?                       [phone], Colonoscopy, flexible; with removal of  ?                       tumor(s), polyp(s), or other lesion(s) by snare  ?                       technique ?                       [id number], 59, Colonoscopy, flexible; with biopsy, single  ?                       or multiple ?Diagnosis Code(s):     --- Professional --- ?                       Z86.010, Personal history of colonic polyps ?                       K63.5, Polyp of colon ?  K62.1, Rectal polyp ?                       K64.0, First degree hemorrhoids ?                       K57.30, Diverticulosis of large intestine without  ?                       perforation or abscess without bleeding ?CPT copyright 2019 [employer]. All rights reserved. ?The codes documented in this report are preliminary and upon coder review may  ?be revised to meet current compliance requirements. ?Andrey Farmer MD, MD ?12/12/2021 11:18:35 AM ?Number of Addenda: 0 ?Note Initiated On: 12/12/2021 10:34 AM ?Scope Withdrawal Time: 0 hours 15 minutes 39 seconds  ?Total Procedure Duration: 0 hours 25 minutes 2 seconds  ?Estimated Blood Loss:  Estimated blood loss was minimal. ?     [facility] ?

## 2021-12-12 NOTE — Anesthesia Preprocedure Evaluation (Addendum)
Anesthesia Evaluation  ?Patient identified by MRN, date of birth, ID band ?Patient awake ? ? ? ?Reviewed: ?Allergy & Precautions, NPO status , Patient's Chart, lab work & pertinent test results ? ?History of Anesthesia Complications ?Negative for: history of anesthetic complications ? ?Airway ?Mallampati: III ? ?TM Distance: >3 FB ?Neck ROM: Full ? ? ? Dental ?no notable dental hx. ? ?  ?Pulmonary ?neg sleep apnea, neg COPD, former smoker,  ?Emphysema ?  ?breath sounds clear to auscultation- rhonchi ?(-) wheezing ? ? ? ? ? Cardiovascular ?Exercise Tolerance: Good ?hypertension, Pt. on medications ?(-) angina+ CAD (- cath (50% mid/distal LAD).), + Past MI (NSTEMI 08/2020.  09/12/20   ) and + Cardiac Stents (~5 yrs ago)  ?(-) CABG  ?Rhythm:Regular Rate:Normal ?- Systolic murmurs and - Diastolic murmurs ?Carotid artery disease- Carotid ultrasound <40% ? ?Subclavian artery stenosis, left  ?  ?Neuro/Psych ? Neuromuscular disease negative psych ROS  ? GI/Hepatic ?Neg liver ROS, hiatal hernia, GERD  Controlled,  ?Endo/Other  ?negative endocrine ROSneg diabetes ? Renal/GU ?negative Renal ROS  ? ?  ?Musculoskeletal ? ?(+) Arthritis ,  ? Abdominal ?(+) - obese,   ?Peds ? Hematology ?negative hematology ROS ?(+)   ?Anesthesia Other Findings ?Past Medical History: ?No date: Allergy ?No date: Arthritis ?No date: Coronary artery disease ?No date: GERD (gastroesophageal reflux disease) ?No date: History of hiatal hernia ?No date: Hypercholesteremia ?No date: Hypertension ? ? Reproductive/Obstetrics ? ?  ? ? ? ? ? ? ? ? ? ? ? ? ? ?  ?  ? ? ? ? ? ? ? ?Anesthesia Physical ? ?Anesthesia Plan ? ?ASA: III ? ?Anesthesia Plan: General  ? ?Post-op Pain Management:   ? ?Induction: Intravenous ? ?PONV Risk Score and Plan: 1 and Propofol infusion and TIVA ? ?Airway Management Planned: Natural Airway ? ?Additional Equipment:  ? ?Intra-op Plan:  ? ?Post-operative Plan:  ? ?Informed Consent: I have reviewed the  patients History and Physical, chart, labs and discussed the procedure including the risks, benefits and alternatives for the proposed anesthesia with the patient or authorized representative who has indicated his/her understanding and acceptance.  ? ? ? ?Dental advisory given ? ?Plan Discussed with: CRNA and Anesthesiologist ? ?Anesthesia Plan Comments:   ? ? ? ? ? ?Anesthesia Quick Evaluation ? ?

## 2021-12-12 NOTE — Interval H&P Note (Signed)
History and Physical Interval Note: ? ?12/12/2021 ?10:44 AM ? ?James Peck  has presented today for surgery, with the diagnosis of HX OF ADENOMATOUS COLONIC POLYPS.  The various methods of treatment have been discussed with the patient and family. After consideration of risks, benefits and other options for treatment, the patient has consented to  Procedure(s): ?COLONOSCOPY WITH PROPOFOL (N/A) as a surgical intervention.  The patient's history has been reviewed, patient examined, no change in status, stable for surgery.  I have reviewed the patient's chart and labs.  Questions were answered to the patient's satisfaction.   ? ? ?James Peck ? ?Ok to proceed with colonoscopy ?

## 2021-12-13 ENCOUNTER — Encounter: Payer: Self-pay | Admitting: Gastroenterology

## 2021-12-13 ENCOUNTER — Other Ambulatory Visit: Payer: PPO

## 2021-12-13 LAB — SURGICAL PATHOLOGY

## 2021-12-22 ENCOUNTER — Other Ambulatory Visit (INDEPENDENT_AMBULATORY_CARE_PROVIDER_SITE_OTHER): Payer: PPO

## 2021-12-22 DIAGNOSIS — R739 Hyperglycemia, unspecified: Secondary | ICD-10-CM

## 2021-12-22 DIAGNOSIS — I10 Essential (primary) hypertension: Secondary | ICD-10-CM

## 2021-12-22 DIAGNOSIS — E785 Hyperlipidemia, unspecified: Secondary | ICD-10-CM

## 2021-12-22 LAB — HEPATIC FUNCTION PANEL
ALT: 24 U/L (ref 0–53)
AST: 23 U/L (ref 0–37)
Albumin: 4.3 g/dL (ref 3.5–5.2)
Alkaline Phosphatase: 55 U/L (ref 39–117)
Bilirubin, Direct: 0.1 mg/dL (ref 0.0–0.3)
Total Bilirubin: 0.7 mg/dL (ref 0.2–1.2)
Total Protein: 6.6 g/dL (ref 6.0–8.3)

## 2021-12-22 LAB — LIPID PANEL
Cholesterol: 138 mg/dL (ref 0–200)
HDL: 47 mg/dL (ref >39.00)
LDL Cholesterol: 67 mg/dL (ref 0–99)
NonHDL: 90.54
Total CHOL/HDL Ratio: 3
Triglycerides: 118 mg/dL (ref 0.0–149.0)
VLDL: 23.6 mg/dL (ref 0.0–40.0)

## 2021-12-22 LAB — HEMOGLOBIN A1C: Hgb A1c MFr Bld: 6.3 % (ref 4.6–6.5)

## 2021-12-22 LAB — TSH: TSH: 1.79 u[IU]/mL (ref 0.35–5.50)

## 2021-12-22 LAB — BASIC METABOLIC PANEL
BUN: 12 mg/dL (ref 6–23)
CO2: 25 mEq/L (ref 19–32)
Calcium: 9.6 mg/dL (ref 8.4–10.5)
Chloride: 101 mEq/L (ref 96–112)
Creatinine, Ser: 0.93 mg/dL (ref 0.40–1.50)
GFR: 77.68 mL/min (ref >60.00)
Glucose, Bld: 110 mg/dL — ABNORMAL HIGH (ref 70–99)
Potassium: 4.4 mEq/L (ref 3.5–5.1)
Sodium: 136 mEq/L (ref 135–145)

## 2022-01-31 ENCOUNTER — Encounter: Payer: Self-pay | Admitting: Pulmonary Disease

## 2022-01-31 ENCOUNTER — Ambulatory Visit: Payer: PPO | Admitting: Pulmonary Disease

## 2022-01-31 ENCOUNTER — Encounter: Payer: Self-pay | Admitting: Internal Medicine

## 2022-01-31 VITALS — BP 128/80 | HR 60 | Temp 97.9°F | Ht 66.0 in | Wt 163.2 lb

## 2022-01-31 DIAGNOSIS — J479 Bronchiectasis, uncomplicated: Secondary | ICD-10-CM | POA: Diagnosis not present

## 2022-01-31 DIAGNOSIS — J449 Chronic obstructive pulmonary disease, unspecified: Secondary | ICD-10-CM

## 2022-01-31 MED ORDER — TRELEGY ELLIPTA 100-62.5-25 MCG/ACT IN AEPB: 1.0000 | INHALATION_SPRAY | Freq: Every day | RESPIRATORY_TRACT | 0 refills | Status: DC

## 2022-01-31 NOTE — Patient Instructions (Signed)
You sounded clear today.  Continue to stay as active as you can.  We will see you in follow-up in 6 months time we will schedule breathing test prior to your return appointment.

## 2022-01-31 NOTE — Progress Notes (Addendum)
Subjective:    Patient ID: James Peck, male    DOB: May 22, 1941, 81 y.o.   MRN: [id number] Patient Care Team: Einar Pheasant, MD as PCP - General (Internal Medicine)  Chief Complaint  Patient presents with   Follow-up    Occ sob with exertion and dry cough.     HPI This is an 81 year old former smoker who follows up on the issue of cough and shortness of breath.  His last scheduled visit here was 15 June 2021.  This is a scheduled follow-up visit.  He has known asthma COPD overlap syndrome and bronchiectasis.  Since his prior visit he has not had any exacerbations or major issues.  He is maintained on Trelegy Ellipta 100.  He has not had to use rescue inhaler.  He is still actively working.  Endorses having dyspnea only with heavy exertion like carrying heavy objects but for the most part activities of daily living do not trigger dyspnea.  He has not had any fevers, chills or sweats.  No cough or sputum production.  Sputum production has decreased significantly since he started Trelegy.  No hemoptysis.  He does not endorse any new symptoms.  Overall he feels well and looks well.   DATA: 04/18/2020 PFTs: FEV1 1.40 L or 57% predicted, FVC 2.48 L or 71% predicted, FEV1/FVC 57%.  There is significant bronchodilator response with 17% change in FEV1 postbronchodilator.  Moderate air trapping, mild diffusion capacity impairment, consistent with COPD/emphysema with reversible airways component. 09/12/2020 2D echo: LVEF 60 to 65%, no regional wall motion normalities.  Grade I DD. 09/12/2020 left heart cath:Insignificant coronary artery disease with patent stent proximal/mid RCA, 50% stenosis mid/distal LAD with muscle bridge ([place]). 12/27/2020 CT chest: Emphysema, lower lobe posterior bronchiectasis, not new volume of retained secretions in bronchus.  No lesions of concern.  Calcified atherosclerosis.  Review of Systems A 10 point review of systems was performed and it is as noted above  otherwise negative.  Patient Active Problem List   Diagnosis Date Noted   Hematuria 10/01/2021   Influenza 08/06/2021   History of colon polyps 08/06/2021   COVID-19 virus infection 07/06/2021   Hyponatremia 04/09/2021   Subclavian artery stenosis, left ([place]) 02/04/2021   Carotid artery disease ([place]) 02/04/2021   Coronary artery disease    Chest pain    NSTEMI (non-ST elevated myocardial infarction) ([place])    COPD exacerbation ([place])    Exposure to confirmed case of COVID-19 07/26/2020   Allergic rhinitis 04/12/2020   Emphysema, unspecified ([place]) 04/12/2020   Former smoker 04/12/2020   Lung nodules 03/27/2020   Renal lesion 03/27/2020   Abnormal abdominal CT scan 02/16/2020   Left carotid bruit 11/10/2019   Right rotator cuff tear 08/19/2019   AC (acromioclavicular) arthritis 07/08/2019   Cough 06/30/2019   Healthcare maintenance 08/28/2018   Leg cramps 04/27/2018   Hyperglycemia 12/22/2017   Degenerative arthritis of left knee 06/04/2017   Degenerative tear of meniscus of left knee 02/14/2015   Acid reflux 01/27/2015   Adenomatous colon polyp 01/27/2015   Allergic state 01/27/2015   Arteriosclerosis of coronary artery 01/27/2015   Lumbar radiculopathy 01/27/2015   CAD in native artery 01/07/2014   Benign essential HTN 10/21/2013   HLD (hyperlipidemia) 10/21/2013   Social History   Tobacco Use   Smoking status: Former    Packs/day: 1.00    Years: 40.00    Total pack years: 40.00    Types: Cigarettes    Quit date: 2000  Years since quitting: 23.5   Smokeless tobacco: Never  Substance Use Topics   Alcohol use: No   Allergies  Allergen Reactions   Ace Inhibitors Cough    Cough Cough   Enalapril Cough   Current Meds  Medication Sig   aspirin EC 81 MG tablet Take 81 mg by mouth daily.    calcium carbonate (OS-CAL - DOSED IN MG OF ELEMENTAL CALCIUM) 1250 (500 CA) MG tablet Take 1 tablet by mouth daily.    cetirizine (ZYRTEC) 10 MG tablet TAKE 1 TABLET BY  MOUTH DAILY   chlorpheniramine (CHLOR-TRIMETON) 4 MG tablet Take 4 mg by mouth 2 (two) times daily as needed for allergies.   CINNAMON PO Take 1,200 mg by mouth 2 times daily at 12 noon and 4 pm.   clopidogrel (PLAVIX) 75 MG tablet Take 1 tablet (75 mg total) by mouth daily.   dextromethorphan-guaiFENesin (MUCINEX DM) 30-600 MG 12hr tablet Take 1 tablet by mouth 2 (two) times daily.   famotidine (PEPCID) 20 MG tablet TAKE 1 TABLET BY MOUTH DAILY   finasteride (PROSCAR) 5 MG tablet Take 1 tablet (5 mg total) by mouth daily.   fluticasone (FLONASE) 50 MCG/ACT nasal spray TAKE 2 SPRAYS INTO BOTH NOSTRILS DAILY   Fluticasone-Umeclidin-Vilant (TRELEGY ELLIPTA) 100-62.5-25 MCG/ACT AEPB Inhale 1 puff into the lungs daily.   GLUCOSAMINE-CHONDROITIN PO Take 1 capsule by mouth daily.   Misc Natural Products (BLACK CHERRY CONCENTRATE PO) Take 1,000 mg daily by mouth.   nitroGLYCERIN (NITROSTAT) 0.4 MG SL tablet Place 1 tablet (0.4 mg total) under the tongue every 5 (five) minutes as needed for chest pain.   Omega-3 Fatty Acids (FISH OIL) 1000 MG CAPS Take 1,000 mg by mouth daily.    rosuvastatin (CRESTOR) 20 MG tablet Take 1 tablet (20 mg total) by mouth daily.   sildenafil (REVATIO) 20 MG tablet TAKE 1 TO 2 TABLETS BY MOUTH EVERY DAY AS NEEDED   telmisartan (MICARDIS) 20 MG tablet TAKE ONE TABLET (20 MG) BY MOUTH EVERY DAY   Immunization History  Administered Date(s) Administered   Fluad Quad(high Dose 65+) 04/09/2019, 04/04/2021   Influenza Split 05/11/2014   Influenza, High Dose Seasonal PF 04/23/2017, 04/29/2018, 04/09/2019, 05/05/2020   Influenza-Unspecified 04/25/2015, 04/23/2017   PFIZER(Purple Top)SARS-COV-2 Vaccination 08/28/2019, 09/18/2019   Tdap 03/11/2013       Objective:   Physical Exam BP 128/80 (BP Location: Left Arm, Cuff Size: Normal)   Pulse 60   Temp 97.9 F (36.6 C) (Temporal)   Ht '5\' 6"'$  (1.676 m)   Wt 163 lb 3.2 oz (74 kg)   SpO2 97%   BMI 26.34 kg/m  GENERAL:  Well-developed, well-nourished elderly gentleman, looks younger than stated age, in no acute distress, fully ambulatory.  No conversational dyspnea. HEAD: Normocephalic, atraumatic.  EYES: Pupils equal, round, reactive to light.  No scleral icterus.  MOUTH: Oral mucosa moist, intact dentition. NECK: Supple. No thyromegaly. Trachea midline. No JVD.  No adenopathy. PULMONARY: Good air entry bilaterally.  No adventitious sounds. CARDIOVASCULAR: S1 and S2. Regular rate and rhythm.  No rubs, murmurs or gallops heard. ABDOMEN: Benign. MUSCULOSKELETAL: No joint deformity, no clubbing, no edema.  NEUROLOGIC: No focal deficit, no gait disturbance, speech is fluent. SKIN: Intact,warm,dry. PSYCH: Mood and behavior normal.     Assessment & Plan:     ICD-10-CM   1. Asthma-COPD overlap syndrome ([place])  J44.9 Pulmonary Function Test [facility] Only   Appears well compensated on current regimen PFTs prior to return visit Follow-up 6 months  2. Bronchiectasis without complication ([place])  W26.3    No issues with mucociliary clearance on Trelegy Cough and sputum production well controlled     Orders Placed This Encounter  Procedures   Pulmonary Function Test [facility] Only    Standing Status:   Future    Standing Expiration Date:   02/01/2023    Scheduling Instructions:     [id number]    Order Specific Question:   Full PFT: includes the following: basic spirometry, spirometry pre & post bronchodilator, diffusion capacity (DLCO), lung volumes    Answer:   Full PFT   Meds ordered this encounter  Medications   Fluticasone-Umeclidin-Vilant (TRELEGY ELLIPTA) 100-62.5-25 MCG/ACT AEPB    Sig: Inhale 1 puff into the lungs daily.    Dispense:  14 each    Refill:  0    Order Specific Question:   Lot Number?    Answer:   [id number]    Order Specific Question:   Expiration Date?    Answer:   05/31/2023    Order Specific Question:   Manufacturer?    Answer:   [employer] [[id number]]    Order Specific Question:   Quantity     Answer:   2    Follow-up in 6 months time he is to contact us prior to that time should any new difficulties arise.  Renold Don, MD Advanced Bronchoscopy [facility]    *This note was dictated using voice recognition software/Dragon.  Despite best efforts to proofread, errors can occur which can change the meaning. Any transcriptional errors that result from this process are unintentional and may not be fully corrected at the time of dictation.

## 2022-02-01 ENCOUNTER — Ambulatory Visit (INDEPENDENT_AMBULATORY_CARE_PROVIDER_SITE_OTHER): Payer: PPO

## 2022-02-01 ENCOUNTER — Encounter (INDEPENDENT_AMBULATORY_CARE_PROVIDER_SITE_OTHER): Payer: Self-pay

## 2022-02-01 ENCOUNTER — Ambulatory Visit (INDEPENDENT_AMBULATORY_CARE_PROVIDER_SITE_OTHER): Payer: PPO | Admitting: Vascular Surgery

## 2022-02-01 DIAGNOSIS — I771 Stricture of artery: Secondary | ICD-10-CM

## 2022-02-01 DIAGNOSIS — I6523 Occlusion and stenosis of bilateral carotid arteries: Secondary | ICD-10-CM | POA: Diagnosis not present

## 2022-02-09 ENCOUNTER — Encounter (INDEPENDENT_AMBULATORY_CARE_PROVIDER_SITE_OTHER): Payer: Self-pay | Admitting: *Deleted

## 2022-02-19 ENCOUNTER — Other Ambulatory Visit: Payer: Self-pay | Admitting: Urology

## 2022-03-05 ENCOUNTER — Other Ambulatory Visit: Payer: Self-pay | Admitting: Internal Medicine

## 2022-03-08 ENCOUNTER — Telehealth: Payer: Self-pay | Admitting: Internal Medicine

## 2022-03-08 ENCOUNTER — Telehealth (INDEPENDENT_AMBULATORY_CARE_PROVIDER_SITE_OTHER): Payer: PPO | Admitting: Internal Medicine

## 2022-03-08 DIAGNOSIS — R059 Cough, unspecified: Secondary | ICD-10-CM

## 2022-03-08 DIAGNOSIS — I10 Essential (primary) hypertension: Secondary | ICD-10-CM | POA: Diagnosis not present

## 2022-03-08 MED ORDER — PREDNISONE 10 MG PO TABS: ORAL_TABLET | ORAL | 0 refills | Status: DC

## 2022-03-08 NOTE — Telephone Encounter (Signed)
Pt stated is having a lot of coughing, is very bothersome. Sometimes has coughing fits that make him a little short of breath, but sob resolves quickly.  Agreeable to virtual visit with you later today.  Pt put on sched for 430 virtual.
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
## 2022-03-08 NOTE — Telephone Encounter (Signed)
Pt called regarding message left from the provider about his symptoms

## 2022-03-08 NOTE — Telephone Encounter (Signed)
Pts wife was in office 03/07/22.  Reported husband having same symptoms (cough, etc).  Please call and see how he is doing - symptoms?  Can add as virtual today if needed.

## 2022-03-18 ENCOUNTER — Encounter: Payer: Self-pay | Admitting: Internal Medicine
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
## 2022-03-18 NOTE — Assessment & Plan Note (Signed)
Continue micardis.  Follow pressures.
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
## 2022-03-20 ENCOUNTER — Ambulatory Visit
Admission: RE | Admit: 2022-03-20 | Discharge: 2022-03-20 | Disposition: A | Discharge status: Home / Self Care | Payer: PPO | Attending: Internal Medicine | Admitting: Internal Medicine

## 2022-03-20 ENCOUNTER — Encounter: Payer: Self-pay | Admitting: Internal Medicine

## 2022-03-20 ENCOUNTER — Ambulatory Visit
Admission: RE | Admit: 2022-03-20 | Discharge: 2022-03-20 | Disposition: A | Discharge status: Home / Self Care | Payer: PPO | Source: Ambulatory Visit | Attending: Internal Medicine | Admitting: Internal Medicine

## 2022-03-20 ENCOUNTER — Ambulatory Visit (INDEPENDENT_AMBULATORY_CARE_PROVIDER_SITE_OTHER): Payer: PPO | Admitting: Internal Medicine

## 2022-03-20 VITALS — BP 128/60 | HR 65 | Temp 98.3°F | Resp 16 | Ht 66.0 in | Wt 162.2 lb

## 2022-03-20 DIAGNOSIS — R739 Hyperglycemia, unspecified: Secondary | ICD-10-CM | POA: Diagnosis not present

## 2022-03-20 DIAGNOSIS — I771 Stricture of artery: Secondary | ICD-10-CM

## 2022-03-20 DIAGNOSIS — I10 Essential (primary) hypertension: Secondary | ICD-10-CM

## 2022-03-20 DIAGNOSIS — J449 Chronic obstructive pulmonary disease, unspecified: Secondary | ICD-10-CM | POA: Diagnosis not present

## 2022-03-20 DIAGNOSIS — R059 Cough, unspecified: Secondary | ICD-10-CM

## 2022-03-20 DIAGNOSIS — Z860100 Personal history of colon polyps, unspecified: Secondary | ICD-10-CM

## 2022-03-20 DIAGNOSIS — J439 Emphysema, unspecified: Secondary | ICD-10-CM | POA: Diagnosis not present

## 2022-03-20 DIAGNOSIS — J479 Bronchiectasis, uncomplicated: Secondary | ICD-10-CM | POA: Diagnosis not present

## 2022-03-20 DIAGNOSIS — E785 Hyperlipidemia, unspecified: Secondary | ICD-10-CM | POA: Diagnosis not present

## 2022-03-20 DIAGNOSIS — I779 Disorder of arteries and arterioles, unspecified: Secondary | ICD-10-CM | POA: Diagnosis not present

## 2022-03-20 DIAGNOSIS — R319 Hematuria, unspecified: Secondary | ICD-10-CM | POA: Diagnosis not present

## 2022-03-20 DIAGNOSIS — I2510 Atherosclerotic heart disease of native coronary artery without angina pectoris: Secondary | ICD-10-CM | POA: Diagnosis not present

## 2022-03-20 DIAGNOSIS — J4489 Other specified chronic obstructive pulmonary disease: Secondary | ICD-10-CM

## 2022-03-20 MED ORDER — AZITHROMYCIN 250 MG PO TABS: ORAL_TABLET | ORAL | 0 refills | Status: AC

## 2022-03-20 MED ORDER — PREDNISONE 10 MG PO TABS: ORAL_TABLET | ORAL | 0 refills | Status: DC

## 2022-03-20 NOTE — Progress Notes (Signed)
Patient ID: DONOVAN PERSLEY, male   DOB: [birth year], 81 y.o.   MRN: [id number]   Subjective:    Patient ID: DRAVON NOTT, male    DOB: [birth year], 81 y.o.   MRN: [id number]   Patient here for follow up appointment.    Chief Complaint  Patient presents with   Cough   .   HPI Had a scheduled follow up appt - to follow up regarding his blood pressure and cholesterol.  He also reports a persistent cough.  Was evaluated recently and treated with prednisone taper.  Cough improved while on the medication, but when he stopped - symptoms returned.  Noticed a few days ago, cough started worsening.  Chest - some tightness.  Productive colored mucus.  Some sob.  No nausea or vomiting.  Using trelegy.  Has albuterol.  Wife just got out of the hospital - diagnosed with question of pneumonia (unclear diagnosis).  No fever.    Past Medical History:  Diagnosis Date   Allergy    Arthritis    Coronary artery disease    GERD (gastroesophageal reflux disease)    History of hiatal hernia    Hypercholesteremia    Hypertension    Past Surgical History:  Procedure Laterality Date   CARDIAC CATHETERIZATION     cardiac stents     CATARACT EXTRACTION W/PHACO Right 09/10/2017   Procedure: CATARACT EXTRACTION PHACO AND INTRAOCULAR LENS PLACEMENT ([place]);  Surgeon: Birder Robson, MD;  Location: [facility];  Service: Ophthalmology;  Laterality: Right;  [place] 01:01.7 AP% 15.3 CDE 9.38 Fluid pack lot # [id number]   CATARACT EXTRACTION W/PHACO Left 10/02/2017   Procedure: CATARACT EXTRACTION PHACO AND INTRAOCULAR LENS PLACEMENT ([facility]);  Surgeon: Birder Robson, MD;  Location: [facility];  Service: Ophthalmology;  Laterality: Left;  [place] 00:42.3 AP% 16.2 CDE 6.85 Fluid Pack Lot # [id number]   COLONOSCOPY WITH PROPOFOL N/A 06/28/2017   Procedure: COLONOSCOPY WITH PROPOFOL;  Surgeon: Manya Silvas, MD;  Location[facility] ENDOSCOPY;  Service: Endoscopy;  Laterality: N/A;   COLONOSCOPY WITH PROPOFOL N/A 12/12/2021   Procedure:  COLONOSCOPY WITH PROPOFOL;  Surgeon: Lesly Rubenstein, MD;  Location: [facility] ENDOSCOPY;  Service: Endoscopy;  Laterality: N/A;   CORONARY ANGIOPLASTY     STENTS X 2   EYE SURGERY     KNEE ARTHROSCOPY     LEFT HEART CATH AND CORONARY ANGIOGRAPHY N/A 09/12/2020   Procedure: LEFT HEART CATH AND CORONARY ANGIOGRAPHY;  Surgeon: Isaias Cowman, MD;  Location: [place] [facility];  Service: Cardiovascular;  Laterality: N/A;   Family History  Problem Relation Age of Onset   Heart disease Mother    Diabetes Mother    Heart disease Father    Diabetes Father    Alzheimer's disease Brother    Stroke Brother    Social History   Socioeconomic History   Marital status: Married    Spouse name: Not on file   Number of children: Not on file   Years of education: Not on file   Highest education level: Not on file  Occupational History   Not on file  Tobacco Use   Smoking status: Former    Packs/day: 1.00    Years: 40.00    Total pack years: 40.00    Types: Cigarettes    Quit date: 2000    Years since quitting: 23.6   Smokeless tobacco: Never  Vaping Use   Vaping Use: Never used  Substance and Sexual Activity   Alcohol use: No  Drug use: No   Sexual activity: Not on file  Other Topics Concern   Not on file  Social History Narrative   Not on file   Social Determinants of Health   Financial Resource Strain: Low Risk  (07/04/2021)   Overall Financial Resource Strain (CARDIA)    Difficulty of Paying Living Expenses: Not hard at all  Food Insecurity: No Food Insecurity (07/04/2021)   Hunger Vital Sign    Worried About Running Out of Food in the Last Year: Never true    Ran Out of Food in the Last Year: Never true  Transportation Needs: No Transportation Needs (07/04/2021)   PRAPARE - Hydrologist (Medical): No    Lack of Transportation (Non-Medical): No  Physical Activity: Sufficiently Active (07/04/2021)   Exercise Vital Sign    Days of Exercise  per Week: 5 days    Minutes of Exercise per Session: 30 min  Stress: No Stress Concern Present (07/04/2021)   [place]    Feeling of Stress : Not at all  Social Connections: Unknown (07/04/2021)   Social Connection and Isolation Panel [NHANES]    Frequency of Communication with Friends and Family: Not on file    Frequency of Social Gatherings with Friends and Family: Not on file    Attends Religious Services: Not on file    Active Member of Clubs or Organizations: Not on file    Attends Archivist Meetings: Not on file    Marital Status: Married     Review of Systems  Constitutional:  Negative for appetite change and unexpected weight change.  HENT:  Negative for sinus pressure.   Respiratory:  Positive for cough.        Some chest tightness and sob - associated with cough and congestion.    Cardiovascular:  Negative for chest pain, palpitations and leg swelling.  Gastrointestinal:  Negative for abdominal pain, diarrhea, nausea and vomiting.  Genitourinary:  Negative for difficulty urinating and dysuria.  Musculoskeletal:  Negative for joint swelling and myalgias.  Skin:  Negative for color change and rash.  Neurological:  Negative for dizziness, light-headedness and headaches.  Psychiatric/Behavioral:  Negative for agitation and dysphoric mood.        Objective:     BP 128/60 (BP Location: Left Arm, Patient Position: Sitting, Cuff Size: Small)   Pulse 65   Temp 98.3 F (36.8 C) (Temporal)   Resp 16   Ht $R'5\' 6"'zG$  (1.676 m)   Wt 162 lb 3.2 oz (73.6 kg)   SpO2 98%   BMI 26.18 kg/m  Wt Readings from Last 3 Encounters:  03/20/22 162 lb 3.2 oz (73.6 kg)  01/31/22 163 lb 3.2 oz (74 kg)  12/12/21 155 lb (70.3 kg)    Physical Exam Constitutional:      General: He is not in acute distress.    Appearance: Normal appearance. He is well-developed.  HENT:     Head: Normocephalic and atraumatic.      Right Ear: External ear normal.     Left Ear: External ear normal.  Eyes:     General: No scleral icterus.       Right eye: No discharge.        Left eye: No discharge.  Cardiovascular:     Rate and Rhythm: Normal rate and regular rhythm.  Pulmonary:     Effort: Pulmonary effort is normal. No respiratory distress.  Breath sounds: Normal breath sounds.     Comments: Increased air movement.  Some increased cough with expiration.  Abdominal:     General: Bowel sounds are normal.     Palpations: Abdomen is soft.     Tenderness: There is no abdominal tenderness.  Musculoskeletal:        General: No swelling or tenderness.     Cervical back: Neck supple. No tenderness.  Lymphadenopathy:     Cervical: No cervical adenopathy.  Skin:    Findings: No erythema or rash.  Neurological:     Mental Status: He is alert.  Psychiatric:        Mood and Affect: Mood normal.        Behavior: Behavior normal.      Outpatient Encounter Medications as of 03/20/2022  Medication Sig   aspirin EC 81 MG tablet Take 81 mg by mouth daily.    azithromycin (ZITHROMAX) 250 MG tablet Take 2 tablets on day 1, then 1 tablet daily on days 2 through 5   calcium carbonate (OS-CAL - DOSED IN MG OF ELEMENTAL CALCIUM) 1250 (500 CA) MG tablet Take 1 tablet by mouth daily.    cetirizine (ZYRTEC) 10 MG tablet TAKE 1 TABLET BY MOUTH DAILY   chlorpheniramine (CHLOR-TRIMETON) 4 MG tablet Take 4 mg by mouth 2 (two) times daily as needed for allergies.   CINNAMON PO Take 1,200 mg by mouth 2 times daily at 12 noon and 4 pm.   clopidogrel (PLAVIX) 75 MG tablet Take 1 tablet (75 mg total) by mouth daily.   dextromethorphan-guaiFENesin (MUCINEX DM) 30-600 MG 12hr tablet Take 1 tablet by mouth 2 (two) times daily.   famotidine (PEPCID) 20 MG tablet TAKE 1 TABLET BY MOUTH DAILY   finasteride (PROSCAR) 5 MG tablet TAKE 1 TABLET BY MOUTH DAILY.   fluticasone (FLONASE) 50 MCG/ACT nasal spray TAKE 2 SPRAYS INTO BOTH NOSTRILS  DAILY   GLUCOSAMINE-CHONDROITIN PO Take 1 capsule by mouth daily.   Misc Natural Products (BLACK CHERRY CONCENTRATE PO) Take 1,000 mg daily by mouth.   nitroGLYCERIN (NITROSTAT) 0.4 MG SL tablet Place 1 tablet (0.4 mg total) under the tongue every 5 (five) minutes as needed for chest pain.   Omega-3 Fatty Acids (FISH OIL) 1000 MG CAPS Take 1,000 mg by mouth daily.    rosuvastatin (CRESTOR) 20 MG tablet Take 1 tablet (20 mg total) by mouth daily.   sildenafil (REVATIO) 20 MG tablet TAKE 1 TO 2 TABLETS BY MOUTH EVERY DAY AS NEEDED   telmisartan (MICARDIS) 20 MG tablet TAKE ONE TABLET (20 MG) BY MOUTH EVERY DAY   [DISCONTINUED] Fluticasone-Umeclidin-Vilant (TRELEGY ELLIPTA) 100-62.5-25 MCG/ACT AEPB Inhale 1 puff into the lungs daily.   [DISCONTINUED] Fluticasone-Umeclidin-Vilant (TRELEGY ELLIPTA) 100-62.5-25 MCG/ACT AEPB Inhale 1 puff into the lungs daily.   [DISCONTINUED] predniSONE (DELTASONE) 10 MG tablet Take 4 tablets x 1 day and then decrease by 1/2 tablet per day until down to zero mg.   predniSONE (DELTASONE) 10 MG tablet Take 4 tablets x 1 day and then decrease by 1/2 tablet per day until down to zero mg.   No facility-administered encounter medications on file as of 03/20/2022.     Lab Results  Component Value Date   WBC 5.7 03/31/2021   HGB 14.6 03/31/2021   HCT 43.0 03/31/2021   PLT 299.0 03/31/2021   GLUCOSE 110 (H) 12/22/2021   CHOL 138 12/22/2021   TRIG 118.0 12/22/2021   HDL 47.00 12/22/2021   LDLCALC 67 12/22/2021  ALT 24 12/22/2021   AST 23 12/22/2021   NA 136 12/22/2021   K 4.4 12/22/2021   CL 101 12/22/2021   CREATININE 0.93 12/22/2021   BUN 12 12/22/2021   CO2 25 12/22/2021   TSH 1.79 12/22/2021   PSA 1.27 11/28/2020   INR 1.0 09/11/2020   HGBA1C 6.3 12/22/2021       Assessment & Plan:   Problem List Items Addressed This Visit     Asthma-COPD overlap syndrome (HCC)    Saw pulmonary 01/2022.  Recommended f/u 6 months with PFTs prior.       Relevant  Medications   azithromycin (ZITHROMAX) 250 MG tablet   predniSONE (DELTASONE) 10 MG tablet   Benign essential HTN - Primary    Continue micardis.  Follow pressures.       Relevant Orders   Basic Metabolic Panel (BMET)   Bronchiectasis without complication (HCC)    Established with pulmonary.  Continue trelegy.  Treat acute cough as outlined.        CAD in native artery    Previously admitted with NSTEMI 08/2020.  09/12/20 - cath (50% mid/distal LAD).  No chest pain now.  Followed by cardiology.  Continue micardis.  Continue crestor.       Carotid artery disease (HCC)    Carotid ultrasound <40%.  Saw [facility].  Recommended f/u in 12 months.       Cough    Persistent cough - congestion - productive as outlined.  Improved while on previous prednisone taper.  Wife just discharged from hospital with persistent cough and congestion.  Treat with prednisone taper.  Continue trelegy.  Has albuterol inhaler if needed.  zpak as directed.  Check cxr.       Relevant Orders   DG Chest 2 View (Completed)   Emphysema, unspecified (HCC)      Continues trelegy.  Has albuterol to take prn.  Treat increased cough and congestion as outlined - zpak and prednisone taper.  Check cxr.        Relevant Medications   azithromycin (ZITHROMAX) 250 MG tablet   predniSONE (DELTASONE) 10 MG tablet   Hematuria    Evaluated by Dr Lonna Cobb - 09/2021 - recommended CT urogram and cystoscopy.  Cysto (10/30/21) - No bladder mucosal abnormalities. Hematuria most likely secondary to BPH. Started finasteride 5 mg daily. Follow-up 1 year or earlier for recurrent hematuria      History of colon polyps    Colonoscopy 12/12/21:   RECTAL POLYP; HOT SNARE:  - TUBULAR ADENOMA WITH MUCOSAL PROLAPSE TYPE CHANGES.  - CAUTERIZED POLYP BASE FREE OF DYSPLASIA.  - NEGATIVE FOR HIGH-GRADE DYSPLASIA AND MALIGNANCY.   COLON POLYPS X2, ASCENDING; COLD BIOPSY AND COLD SNARE:  - MULTIPLE FRAGMENTS OF TUBULAR ADENOMAS.  - NEGATIVE FOR  HIGH-GRADE DYSPLASIA AND MALIGNANCY.      HLD (hyperlipidemia)    On crestor.  Low cholesterol diet and exercise.  Follow lipid panel and liver function tests       Relevant Orders   Lipid Profile   Hepatic function panel   CBC with Differential/Platelet   Hyperglycemia    Low carb diet and exercise. Follow met b and a1c.   Lab Results  Component Value Date   HGBA1C 6.3 12/22/2021       Relevant Orders   HgB A1c   Subclavian artery stenosis, left (HCC)    No arm pain.  Continue antiplatelet therapy.  Saw [facility] 01/2021.  Recommended f/u in one year.  Kynleigh Artz, MD  

## 2022-03-22 ENCOUNTER — Other Ambulatory Visit: Payer: Self-pay | Admitting: Pulmonary Disease

## 2022-03-22 ENCOUNTER — Ambulatory Visit: Payer: PPO | Admitting: Internal Medicine

## 2022-03-25 ENCOUNTER — Encounter: Payer: Self-pay | Admitting: Internal Medicine

## 2022-03-25 NOTE — Assessment & Plan Note (Signed)
Saw pulmonary 01/2022.  Recommended f/u 6 months with PFTs prior.

## 2022-03-25 NOTE — Assessment & Plan Note (Signed)
Continue micardis.  Follow pressures.

## 2022-03-25 NOTE — Assessment & Plan Note (Signed)
Colonoscopy 12/12/21:   RECTAL POLYP; HOT SNARE:  - TUBULAR ADENOMA WITH MUCOSAL PROLAPSE TYPE CHANGES.  - CAUTERIZED POLYP BASE FREE OF DYSPLASIA.  - NEGATIVE FOR HIGH-GRADE DYSPLASIA AND MALIGNANCY.   COLON POLYPS X2, ASCENDING; COLD BIOPSY AND COLD SNARE:  - MULTIPLE FRAGMENTS OF TUBULAR ADENOMAS.  - NEGATIVE FOR HIGH-GRADE DYSPLASIA AND MALIGNANCY. 

## 2022-03-25 NOTE — Assessment & Plan Note (Signed)
On crestor.  Low cholesterol diet and exercise.  Follow lipid panel and liver function tests.   

## 2022-03-25 NOTE — Assessment & Plan Note (Signed)
Continues trelegy.  Has albuterol to take prn.  Treat increased cough and congestion as outlined - zpak and prednisone taper.  Check cxr.

## 2022-03-25 NOTE — Assessment & Plan Note (Signed)
Low carb diet and exercise. Follow met b and a1c.   Lab Results  Component Value Date   HGBA1C 6.3 12/22/2021

## 2022-03-25 NOTE — Assessment & Plan Note (Signed)
Established with pulmonary.  Continue trelegy.  Treat acute cough as outlined.

## 2022-03-25 NOTE — Assessment & Plan Note (Signed)
Persistent cough - congestion - productive as outlined.  Improved while on previous prednisone taper.  Wife just discharged from hospital with persistent cough and congestion.  Treat with prednisone taper.  Continue trelegy.  Has albuterol inhaler if needed.  zpak as directed.  Check cxr.

## 2022-03-25 NOTE — Assessment & Plan Note (Addendum)
Previously admitted with NSTEMI 08/2020.  09/12/20 - cath (50% mid/distal LAD).  No chest pain now.  Followed by cardiology.  Continue micardis. Continue crestor.  

## 2022-03-25 NOTE — Assessment & Plan Note (Signed)
No arm pain.  Continue antiplatelet therapy.  Saw [facility] 01/2021.  Recommended f/u in one year.

## 2022-03-25 NOTE — Assessment & Plan Note (Signed)
Evaluated by Dr Bernardo Heater - 09/2021 - recommended CT urogram and cystoscopy.  Cysto (10/30/21) - No bladder mucosal abnormalities. Hematuria most likely secondary to BPH. Started finasteride 5 mg daily. Follow-up 1 year or earlier for recurrent hematuria

## 2022-03-25 NOTE — Assessment & Plan Note (Signed)
Carotid ultrasound <40%.  Saw [facility].  Recommended f/u in 12 months.  

## 2022-03-26 ENCOUNTER — Other Ambulatory Visit: Payer: Self-pay | Admitting: Internal Medicine

## 2022-04-04 DIAGNOSIS — I10 Essential (primary) hypertension: Secondary | ICD-10-CM | POA: Diagnosis not present

## 2022-04-04 DIAGNOSIS — R0989 Other specified symptoms and signs involving the circulatory and respiratory systems: Secondary | ICD-10-CM | POA: Diagnosis not present

## 2022-04-04 DIAGNOSIS — E782 Mixed hyperlipidemia: Secondary | ICD-10-CM | POA: Diagnosis not present

## 2022-04-04 DIAGNOSIS — I25119 Atherosclerotic heart disease of native coronary artery with unspecified angina pectoris: Secondary | ICD-10-CM | POA: Diagnosis not present

## 2022-04-25 DIAGNOSIS — I25119 Atherosclerotic heart disease of native coronary artery with unspecified angina pectoris: Secondary | ICD-10-CM | POA: Diagnosis not present

## 2022-04-30 ENCOUNTER — Other Ambulatory Visit: Payer: Self-pay | Admitting: Internal Medicine

## 2022-05-10 DIAGNOSIS — I10 Essential (primary) hypertension: Secondary | ICD-10-CM | POA: Diagnosis not present

## 2022-05-10 DIAGNOSIS — I2510 Atherosclerotic heart disease of native coronary artery without angina pectoris: Secondary | ICD-10-CM | POA: Diagnosis not present

## 2022-05-22 ENCOUNTER — Other Ambulatory Visit (INDEPENDENT_AMBULATORY_CARE_PROVIDER_SITE_OTHER): Payer: PPO

## 2022-05-22 DIAGNOSIS — E785 Hyperlipidemia, unspecified: Secondary | ICD-10-CM | POA: Diagnosis not present

## 2022-05-22 DIAGNOSIS — R739 Hyperglycemia, unspecified: Secondary | ICD-10-CM | POA: Diagnosis not present

## 2022-05-22 DIAGNOSIS — I10 Essential (primary) hypertension: Secondary | ICD-10-CM | POA: Diagnosis not present

## 2022-05-22 LAB — CBC WITH DIFFERENTIAL/PLATELET
Basophils Absolute: 0 10*3/uL (ref 0.0–0.1)
Basophils Relative: 0.7 % (ref 0.0–3.0)
Eosinophils Absolute: 0.3 10*3/uL (ref 0.0–0.7)
Eosinophils Relative: 3.7 % (ref 0.0–5.0)
HCT: 44.6 % (ref 39.0–52.0)
Hemoglobin: 15.1 g/dL (ref 13.0–17.0)
Lymphocytes Relative: 25.7 % (ref 12.0–46.0)
Lymphs Abs: 1.9 10*3/uL (ref 0.7–4.0)
MCHC: 33.7 g/dL (ref 30.0–36.0)
MCV: 92.5 fl (ref 78.0–100.0)
Monocytes Absolute: 0.9 10*3/uL (ref 0.1–1.0)
Monocytes Relative: 12.1 % — ABNORMAL HIGH (ref 3.0–12.0)
Neutro Abs: 4.2 10*3/uL (ref 1.4–7.7)
Neutrophils Relative %: 57.8 % (ref 43.0–77.0)
Platelets: 306 10*3/uL (ref 150.0–400.0)
RBC: 4.83 Mil/uL (ref 4.22–5.81)
RDW: 14.3 % (ref 11.5–15.5)
WBC: 7.2 10*3/uL (ref 4.0–10.5)

## 2022-05-22 LAB — HEPATIC FUNCTION PANEL
ALT: 26 U/L (ref 0–53)
AST: 24 U/L (ref 0–37)
Albumin: 4.5 g/dL (ref 3.5–5.2)
Alkaline Phosphatase: 54 U/L (ref 39–117)
Bilirubin, Direct: 0.1 mg/dL (ref 0.0–0.3)
Total Bilirubin: 0.6 mg/dL (ref 0.2–1.2)
Total Protein: 6.8 g/dL (ref 6.0–8.3)

## 2022-05-22 LAB — BASIC METABOLIC PANEL
BUN: 12 mg/dL (ref 6–23)
CO2: 29 mEq/L (ref 19–32)
Calcium: 9.4 mg/dL (ref 8.4–10.5)
Chloride: 100 mEq/L (ref 96–112)
Creatinine, Ser: 0.95 mg/dL (ref 0.40–1.50)
GFR: 75.5 mL/min (ref >60.00)
Glucose, Bld: 107 mg/dL — ABNORMAL HIGH (ref 70–99)
Potassium: 4.2 mEq/L (ref 3.5–5.1)
Sodium: 135 mEq/L (ref 135–145)

## 2022-05-22 LAB — LIPID PANEL
Cholesterol: 131 mg/dL (ref 0–200)
HDL: 47.6 mg/dL (ref >39.00)
LDL Cholesterol: 60 mg/dL (ref 0–99)
NonHDL: 83.1
Total CHOL/HDL Ratio: 3
Triglycerides: 114 mg/dL (ref 0.0–149.0)
VLDL: 22.8 mg/dL (ref 0.0–40.0)

## 2022-05-22 LAB — HEMOGLOBIN A1C: Hgb A1c MFr Bld: 6.4 % (ref 4.6–6.5)

## 2022-05-24 ENCOUNTER — Encounter: Payer: Self-pay | Admitting: Internal Medicine

## 2022-05-24 ENCOUNTER — Ambulatory Visit (INDEPENDENT_AMBULATORY_CARE_PROVIDER_SITE_OTHER): Payer: PPO | Admitting: Internal Medicine

## 2022-05-24 VITALS — BP 146/60 | HR 66 | Temp 98.0°F | Ht 66.0 in | Wt 163.2 lb

## 2022-05-24 DIAGNOSIS — Z0000 Encounter for general adult medical examination without abnormal findings: Secondary | ICD-10-CM | POA: Diagnosis not present

## 2022-05-24 DIAGNOSIS — I771 Stricture of artery: Secondary | ICD-10-CM | POA: Diagnosis not present

## 2022-05-24 DIAGNOSIS — Z860100 Personal history of colon polyps, unspecified: Secondary | ICD-10-CM

## 2022-05-24 DIAGNOSIS — I2510 Atherosclerotic heart disease of native coronary artery without angina pectoris: Secondary | ICD-10-CM | POA: Diagnosis not present

## 2022-05-24 DIAGNOSIS — J439 Emphysema, unspecified: Secondary | ICD-10-CM | POA: Diagnosis not present

## 2022-05-24 DIAGNOSIS — R739 Hyperglycemia, unspecified: Secondary | ICD-10-CM

## 2022-05-24 DIAGNOSIS — I10 Essential (primary) hypertension: Secondary | ICD-10-CM | POA: Diagnosis not present

## 2022-05-24 DIAGNOSIS — I779 Disorder of arteries and arterioles, unspecified: Secondary | ICD-10-CM | POA: Diagnosis not present

## 2022-05-24 DIAGNOSIS — J479 Bronchiectasis, uncomplicated: Secondary | ICD-10-CM

## 2022-05-24 DIAGNOSIS — E785 Hyperlipidemia, unspecified: Secondary | ICD-10-CM

## 2022-05-24 MED ORDER — TELMISARTAN 40 MG PO TABS: 40.0000 mg | ORAL_TABLET | Freq: Every day | ORAL | 3 refills | Status: DC

## 2022-05-24 NOTE — Progress Notes (Signed)
Patient ID: James Peck, male   DOB: 03/07/1941, 81 y.o.   MRN: [id number]   Subjective:    Patient ID: James Peck, male    DOB: Feb 04, 1941, 81 y.o.   MRN: [id number]   Patient here for  Chief Complaint  Patient presents with   Annual Exam    Pt would also like to discuss his medication due to him having a heart attack in 2022 they placed him on plavix and now his new cardiologist wants him to double his BP medication and stop plavix. Pt is still taking plavix but is doubling his BP meds.   Marland Kitchen   HPI  Here for his physical. Saw Dr Nehemiah Massed 04/04/22.  Recommended stress test (- reviewed.  Elected Conservation officer, nature.  ECHO - trivial TR, mild MR and eF 50-55%.  Doubled micardis dose. Cardiology discussed stopping plavis and continuing aspirin daily.  Had questions regarding stopping.  Plans to discuss with cardiology.  No chest pain.  Breathing stable.  No abdominal pain.  Bowels moving.  Discussed labs.   Past Medical History:  Diagnosis Date   Allergy    Arthritis    Coronary artery disease    GERD (gastroesophageal reflux disease)    History of hiatal hernia    Hypercholesteremia    Hypertension    Past Surgical History:  Procedure Laterality Date   CARDIAC CATHETERIZATION     cardiac stents     CATARACT EXTRACTION W/PHACO Right 09/10/2017   Procedure: CATARACT EXTRACTION PHACO AND INTRAOCULAR LENS PLACEMENT ([place]);  Surgeon: Birder Robson, MD;  Location: [facility] ORS;  Service: Ophthalmology;  Laterality: Right;  [place] 01:01.7 AP% 15.3 CDE 9.38 Fluid pack lot # [id number]   CATARACT EXTRACTION W/PHACO Left 10/02/2017   Procedure: CATARACT EXTRACTION PHACO AND INTRAOCULAR LENS PLACEMENT ([facility]);  Surgeon: Birder Robson, MD;  Location: [facility];  Service: Ophthalmology;  Laterality: Left;  [place] 00:42.3 AP% 16.2 CDE 6.85 Fluid Pack Lot # [id number]   COLONOSCOPY WITH PROPOFOL N/A 06/28/2017   Procedure: COLONOSCOPY WITH PROPOFOL;  Surgeon: Manya Silvas, MD;  Location: [facility]  ENDOSCOPY;  Service: Endoscopy;  Laterality: N/A;   COLONOSCOPY WITH PROPOFOL N/A 12/12/2021   Procedure: COLONOSCOPY WITH PROPOFOL;  Surgeon: Lesly Rubenstein, MD;  Location: [facility] ENDOSCOPY;  Service: Endoscopy;  Laterality: N/A;   CORONARY ANGIOPLASTY     STENTS X 2   EYE SURGERY     KNEE ARTHROSCOPY     LEFT HEART CATH AND CORONARY ANGIOGRAPHY N/A 09/12/2020   Procedure: LEFT HEART CATH AND CORONARY ANGIOGRAPHY;  Surgeon: Isaias Cowman, MD;  Location: [place] [facility];  Service: Cardiovascular;  Laterality: N/A;   Family History  Problem Relation Age of Onset   Heart disease Mother    Diabetes Mother    Heart disease Father    Diabetes Father    Alzheimer's disease Brother    Stroke Brother    Social History   Socioeconomic History   Marital status: Married    Spouse name: Not on file   Number of children: Not on file   Years of education: Not on file   Highest education level: Not on file  Occupational History   Not on file  Tobacco Use   Smoking status: Former    Packs/day: 1.00    Years: 40.00    Total pack years: 40.00    Types: Cigarettes    Quit date: 2000    Years since quitting: 23.8   Smokeless tobacco: Never  Vaping  Use   Vaping Use: Never used  Substance and Sexual Activity   Alcohol use: No   Drug use: No   Sexual activity: Not on file  Other Topics Concern   Not on file  Social History Narrative   Not on file   Social Determinants of Health   Financial Resource Strain: Low Risk  (07/04/2021)   Overall Financial Resource Strain (CARDIA)    Difficulty of Paying Living Expenses: Not hard at all  Food Insecurity: No Food Insecurity (07/04/2021)   Hunger Vital Sign    Worried About Running Out of Food in the Last Year: Never true    Ran Out of Food in the Last Year: Never true  Transportation Needs: No Transportation Needs (07/04/2021)   PRAPARE - Hydrologist (Medical): No    Lack of Transportation  (Non-Medical): No  Physical Activity: Sufficiently Active (07/04/2021)   Exercise Vital Sign    Days of Exercise per Week: 5 days    Minutes of Exercise per Session: 30 min  Stress: No Stress Concern Present (07/04/2021)   [place]    Feeling of Stress : Not at all  Social Connections: Unknown (07/04/2021)   Social Connection and Isolation Panel [NHANES]    Frequency of Communication with Friends and Family: Not on file    Frequency of Social Gatherings with Friends and Family: Not on file    Attends Religious Services: Not on file    Active Member of Clubs or Organizations: Not on file    Attends Archivist Meetings: Not on file    Marital Status: Married     Review of Systems  Constitutional:  Negative for appetite change and unexpected weight change.  HENT:  Negative for congestion, sinus pressure and sore throat.   Eyes:  Negative for pain and visual disturbance.  Respiratory:  Negative for cough, chest tightness and shortness of breath.   Cardiovascular:  Negative for chest pain, palpitations and leg swelling.  Gastrointestinal:  Negative for abdominal pain, diarrhea, nausea and vomiting.  Genitourinary:  Negative for difficulty urinating and dysuria.  Musculoskeletal:  Negative for joint swelling and myalgias.  Skin:  Negative for color change and rash.  Neurological:  Negative for dizziness, light-headedness and headaches.  Hematological:  Negative for adenopathy. Does not bruise/bleed easily.  Psychiatric/Behavioral:  Negative for agitation and dysphoric mood.        Objective:     BP (!) 146/60 (BP Location: Left Arm, Patient Position: Sitting, Cuff Size: Normal)   Pulse 66   Temp 98 F (36.7 C) (Oral)   Ht '5\' 6"'  (1.676 m)   Wt 163 lb 3.2 oz (74 kg)   SpO2 97%   BMI 26.34 kg/m  Wt Readings from Last 3 Encounters:  05/24/22 163 lb 3.2 oz (74 kg)  03/20/22 162 lb 3.2 oz (73.6 kg)   01/31/22 163 lb 3.2 oz (74 kg)    Physical Exam Constitutional:      General: He is not in acute distress.    Appearance: Normal appearance. He is well-developed.  HENT:     Head: Normocephalic and atraumatic.     Right Ear: External ear normal.     Left Ear: External ear normal.  Eyes:     General: No scleral icterus.       Right eye: No discharge.        Left eye: No discharge.  Conjunctiva/sclera: Conjunctivae normal.  Neck:     Thyroid: No thyromegaly.  Cardiovascular:     Rate and Rhythm: Normal rate and regular rhythm.  Pulmonary:     Effort: No respiratory distress.     Breath sounds: Normal breath sounds. No wheezing.  Abdominal:     General: Bowel sounds are normal.     Palpations: Abdomen is soft.     Tenderness: There is no abdominal tenderness.  Musculoskeletal:        General: No swelling or tenderness.     Cervical back: Neck supple. No tenderness.  Lymphadenopathy:     Cervical: No cervical adenopathy.  Skin:    Findings: No erythema or rash.  Neurological:     Mental Status: He is alert and oriented to person, place, and time.  Psychiatric:        Mood and Affect: Mood normal.        Behavior: Behavior normal.      Outpatient Encounter Medications as of 05/24/2022  Medication Sig   aspirin EC 81 MG tablet Take 81 mg by mouth daily.    calcium carbonate (OS-CAL - DOSED IN MG OF ELEMENTAL CALCIUM) 1250 (500 CA) MG tablet Take 1 tablet by mouth daily.    cetirizine (ZYRTEC) 10 MG tablet TAKE 1 TABLET BY MOUTH DAILY   chlorpheniramine (CHLOR-TRIMETON) 4 MG tablet Take 4 mg by mouth 2 (two) times daily as needed for allergies.   CINNAMON PO Take 1,200 mg by mouth 2 times daily at 12 noon and 4 pm.   clopidogrel (PLAVIX) 75 MG tablet Take 1 tablet (75 mg total) by mouth daily.   dextromethorphan-guaiFENesin (MUCINEX DM) 30-600 MG 12hr tablet Take 1 tablet by mouth 2 (two) times daily.   famotidine (PEPCID) 20 MG tablet TAKE 1 TABLET BY MOUTH DAILY    finasteride (PROSCAR) 5 MG tablet TAKE 1 TABLET BY MOUTH DAILY.   fluticasone (FLONASE) 50 MCG/ACT nasal spray TAKE 2 SPRAYS INTO BOTH NOSTRILS DAILY   GLUCOSAMINE-CHONDROITIN PO Take 1 capsule by mouth daily.   Misc Natural Products (BLACK CHERRY CONCENTRATE PO) Take 1,000 mg daily by mouth.   nitroGLYCERIN (NITROSTAT) 0.4 MG SL tablet Place 1 tablet (0.4 mg total) under the tongue every 5 (five) minutes as needed for chest pain.   Omega-3 Fatty Acids (FISH OIL) 1000 MG CAPS Take 1,000 mg by mouth daily.    rosuvastatin (CRESTOR) 20 MG tablet Take 1 tablet (20 mg total) by mouth daily.   sildenafil (REVATIO) 20 MG tablet TAKE 1 TO 2 TABLETS BY MOUTH EVERY DAY AS NEEDED   TRELEGY ELLIPTA 100-62.5-25 MCG/ACT AEPB INHALE 1 PUFF INTO THE LUNGS DAILY.   [DISCONTINUED] telmisartan (MICARDIS) 40 MG tablet Take 40 mg by mouth daily.   telmisartan (MICARDIS) 40 MG tablet Take 1 tablet (40 mg total) by mouth daily.   [DISCONTINUED] predniSONE (DELTASONE) 10 MG tablet Take 4 tablets x 1 day and then decrease by 1/2 tablet per day until down to zero mg.   [DISCONTINUED] telmisartan (MICARDIS) 20 MG tablet TAKE ONE TABLET (20 MG) BY MOUTH EVERY DAY (Patient not taking: Reported on 05/24/2022)   No facility-administered encounter medications on file as of 05/24/2022.     Lab Results  Component Value Date   WBC 7.2 05/22/2022   HGB 15.1 05/22/2022   HCT 44.6 05/22/2022   PLT 306.0 05/22/2022   GLUCOSE 107 (H) 05/22/2022   CHOL 131 05/22/2022   TRIG 114.0 05/22/2022   HDL 47.60 05/22/2022  LDLCALC 60 05/22/2022   ALT 26 05/22/2022   AST 24 05/22/2022   NA 135 05/22/2022   K 4.2 05/22/2022   CL 100 05/22/2022   CREATININE 0.95 05/22/2022   BUN 12 05/22/2022   CO2 29 05/22/2022   TSH 1.79 12/22/2021   PSA 1.27 11/28/2020   INR 1.0 09/11/2020   HGBA1C 6.4 05/22/2022    DG Chest 2 View  Result Date: 03/22/2022 CLINICAL DATA:  Persistent cough and congestion EXAM: CHEST - 2 VIEW COMPARISON:   Chest x-ray dated 09/11/2020 FINDINGS: Heart size and mediastinal contours are within normal limits. Lungs are hyperexpanded. Chronic bronchitic changes noted centrally. No pulmonary nodule or mass is seen. No confluent airspace opacity to suggest a developing pneumonia. No pleural effusion or pneumothorax is seen. No acute-appearing osseous abnormality. IMPRESSION: 1. No active cardiopulmonary disease. No evidence of pneumonia or pulmonary edema. 2. Hyperexpanded lungs indicating COPD. Electronically Signed   By: Franki Cabot M.D.   On: 03/22/2022 08:31       Assessment & Plan:   Problem List Items Addressed This Visit     Benign essential HTN    micardis recently increased to 44m q day.  Continue.  Follow pressures.  Follow metabolic panel.       Relevant Medications   telmisartan (MICARDIS) 40 MG tablet   Bronchiectasis without complication ([facility]    Established with pulmonary.  Continue trelegy.         CAD in native artery    Previously admitted with NSTEMI 08/2020.  09/12/20 - cath (50% mid/distal LAD).  No chest pain now.  Followed by cardiology.  Continue micardis. Dose increased.  Continue crestor.       Relevant Medications   telmisartan (MICARDIS) 40 MG tablet   Carotid artery disease (HCC)    Carotid ultrasound <40%.  Saw [facility].  Recommended f/u in 12 months.       Relevant Medications   telmisartan (MICARDIS) 40 MG tablet   Emphysema, unspecified ([facility]      Continues trelegy.  Has albuterol to take prn.  Cough improved.  Breathing stable.       Healthcare maintenance    Physical today 05/24/22.  Colonoscopy 11/2021.  No f/u recommended.        History of colon polyps    Colonoscopy 12/12/21:   RECTAL POLYP; HOT SNARE:  - TUBULAR ADENOMA WITH MUCOSAL PROLAPSE TYPE CHANGES.  - CAUTERIZED POLYP BASE FREE OF DYSPLASIA.  - NEGATIVE FOR HIGH-GRADE DYSPLASIA AND MALIGNANCY.   COLON POLYPS X2, ASCENDING; COLD BIOPSY AND COLD SNARE:  - MULTIPLE FRAGMENTS OF TUBULAR  ADENOMAS.  - NEGATIVE FOR HIGH-GRADE DYSPLASIA AND MALIGNANCY.      HLD (hyperlipidemia)    On crestor.  Low cholesterol diet and exercise.  Follow lipid panel and liver function tests       Relevant Medications   telmisartan (MICARDIS) 40 MG tablet   Hyperglycemia    Low carb diet and exercise. Follow met b and a1c.   Lab Results  Component Value Date   HGBA1C 6.4 05/22/2022       Subclavian artery stenosis, left (HCC)    No arm pain.  Continue antiplatelet therapy.  Saw [facility] 01/2021.  Recommended f/u in one year. Per review, appears to have had f/u  01/2022.       Relevant Medications   telmisartan (MICARDIS) 40 MG tablet   Other Visit Diagnoses     Routine general medical examination at a health care facility    -  Primary        Einar Pheasant, MD

## 2022-05-24 NOTE — Assessment & Plan Note (Signed)
Physical today 05/24/22.  Colonoscopy 11/2021.  No f/u recommended.

## 2022-05-26 ENCOUNTER — Encounter: Payer: Self-pay | Admitting: Internal Medicine

## 2022-05-26 NOTE — Assessment & Plan Note (Signed)
micardis recently increased to '40mg'$  q day.  Continue.  Follow pressures.  Follow metabolic panel.

## 2022-05-26 NOTE — Assessment & Plan Note (Signed)
Previously admitted with NSTEMI 08/2020.  09/12/20 - cath (50% mid/distal LAD).  No chest pain now.  Followed by cardiology.  Continue micardis. Dose increased.  Continue crestor.

## 2022-05-26 NOTE — Assessment & Plan Note (Signed)
On crestor.  Low cholesterol diet and exercise.  Follow lipid panel and liver function tests.   

## 2022-05-26 NOTE — Assessment & Plan Note (Signed)
Carotid ultrasound <40%.  Saw [facility].  Recommended f/u in 12 months.

## 2022-05-26 NOTE — Assessment & Plan Note (Signed)
Colonoscopy 12/12/21:   RECTAL POLYP; HOT SNARE:  - TUBULAR ADENOMA WITH MUCOSAL PROLAPSE TYPE CHANGES.  - CAUTERIZED POLYP BASE FREE OF DYSPLASIA.  - NEGATIVE FOR HIGH-GRADE DYSPLASIA AND MALIGNANCY.   COLON POLYPS X2, ASCENDING; COLD BIOPSY AND COLD SNARE:  - MULTIPLE FRAGMENTS OF TUBULAR ADENOMAS.  - NEGATIVE FOR HIGH-GRADE DYSPLASIA AND MALIGNANCY.

## 2022-05-26 NOTE — Assessment & Plan Note (Signed)
Low carb diet and exercise. Follow met b and a1c.   Lab Results  Component Value Date   HGBA1C 6.4 05/22/2022

## 2022-05-26 NOTE — Assessment & Plan Note (Signed)
Continues trelegy.  Has albuterol to take prn.  Cough improved.  Breathing stable.

## 2022-05-26 NOTE — Assessment & Plan Note (Addendum)
No arm pain.  Continue antiplatelet therapy.  Saw [facility] 01/2021.  Recommended f/u in one year. Per review, appears to have had f/u  01/2022.

## 2022-05-26 NOTE — Assessment & Plan Note (Signed)
Established with pulmonary.  Continue trelegy.

## 2022-05-28 ENCOUNTER — Encounter (INDEPENDENT_AMBULATORY_CARE_PROVIDER_SITE_OTHER): Payer: Self-pay

## 2022-06-01 ENCOUNTER — Other Ambulatory Visit: Payer: Self-pay | Admitting: Internal Medicine

## 2022-06-08 DIAGNOSIS — D3131 Benign neoplasm of right choroid: Secondary | ICD-10-CM | POA: Diagnosis not present

## 2022-06-11 ENCOUNTER — Other Ambulatory Visit: Payer: Self-pay | Admitting: Urology

## 2022-06-18 IMAGING — CT CT ABD-PEL WO/W CM
3 of 12 series · 11 of 46 positions shown, 17 images · IV contrast (omnipaque)
Comparison: None.

CLINICAL DATA: Gross hematuria.

EXAM:
CT ABDOMEN AND PELVIS WITHOUT AND WITH CONTRAST
TECHNIQUE: Multidetector CT imaging of the abdomen and pelvis was performed
following the standard protocol before and following the bolus
administration of intravenous contrast.
CONTRAST:  125mL OMNIPAQUE IOHEXOL 300 MG/ML  SOLN

[Series 2: abd without pre 5.00 · axial · non-contrast · 0.70mm/px · z∈[-1470,-1410]mm · 2 of 84 slices shown]
[im 12/84  soft-tissue]
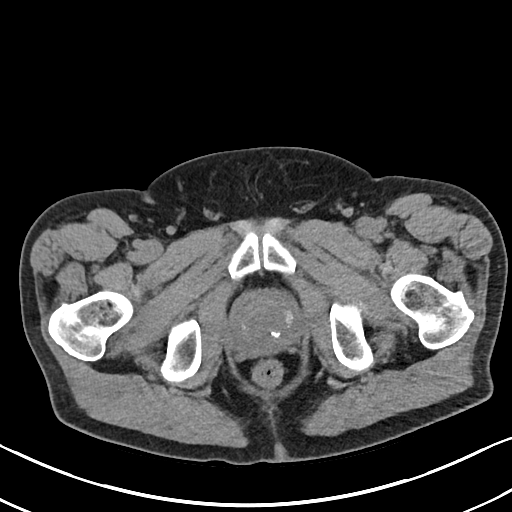
[im 24/84  soft-tissue]
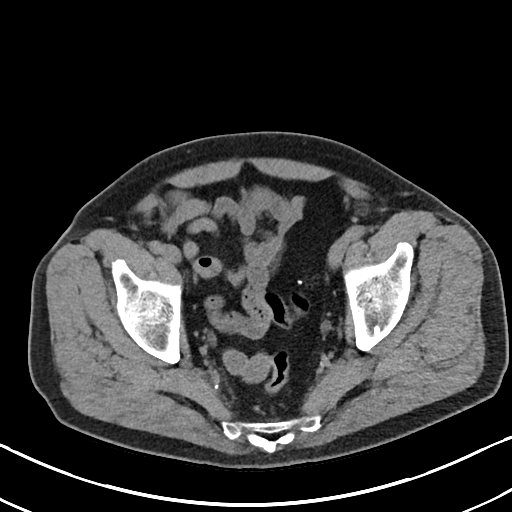

[Series 5: cor without without pre 2.00 cor · coronal · non-contrast · 0.70mm/px · 2 of 143 slices shown, 3 images]
[im 48/143  soft-tissue]
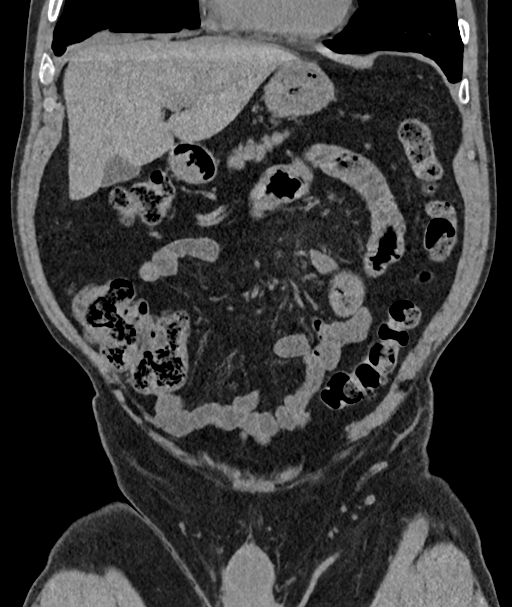
[im 48/143  bone]
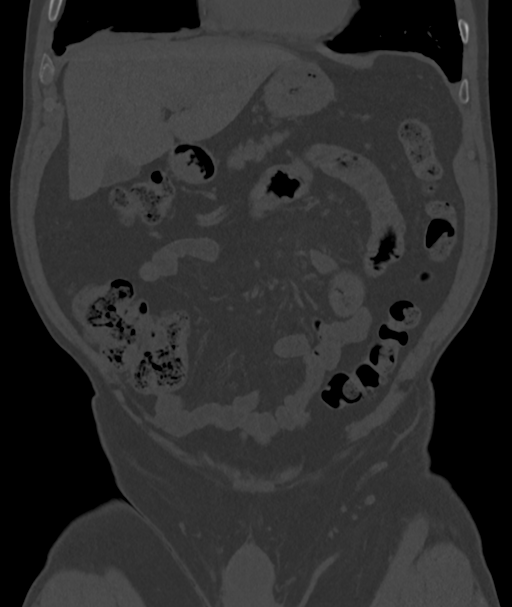
[im 95/143  soft-tissue]
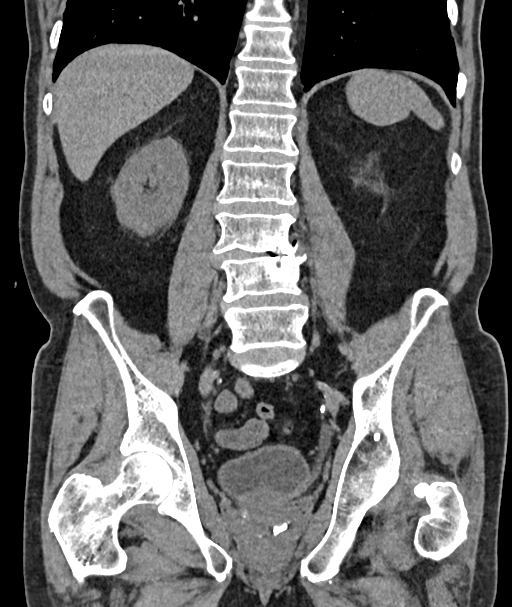

[Series 17: axial delay delay prone 5.00 · axial · delayed · 0.70mm/px · z∈[-1604,-1249]mm · 7 of 95 slices shown, 12 images]
[im 12/95  soft-tissue]
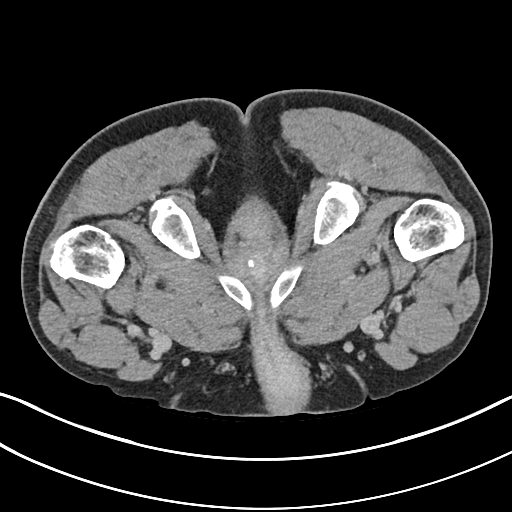
[im 12/95  bone]
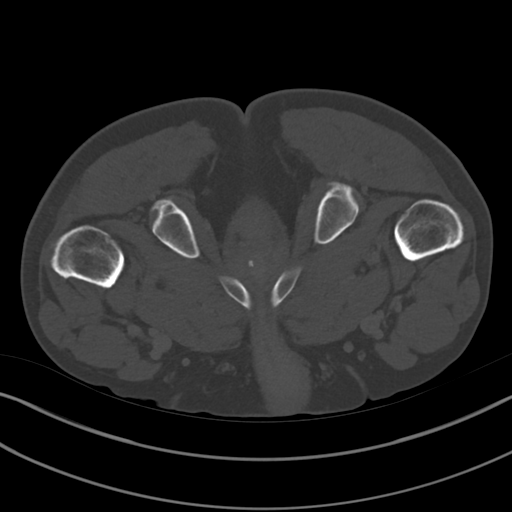
[im 24/95  soft-tissue]
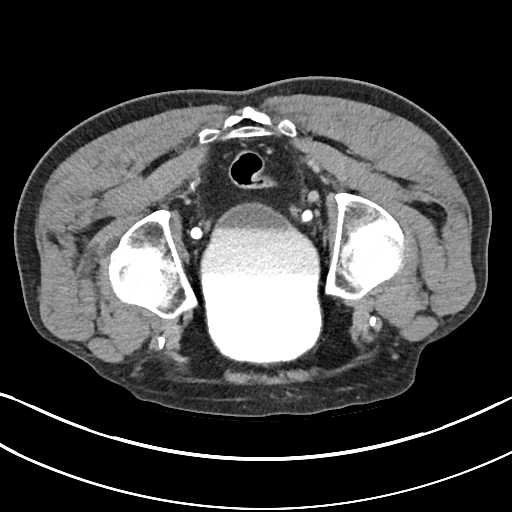
[im 36/95  soft-tissue]
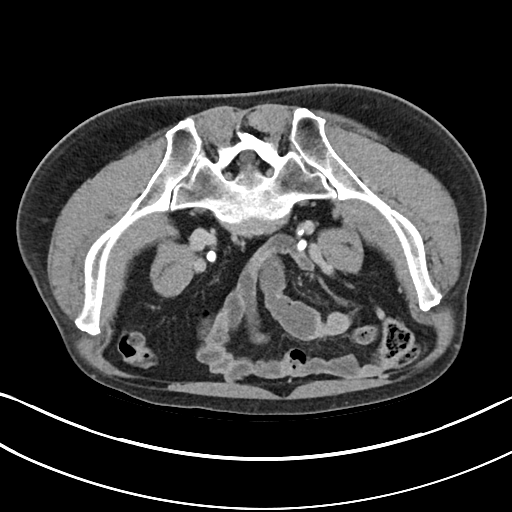
[im 48/95  soft-tissue]
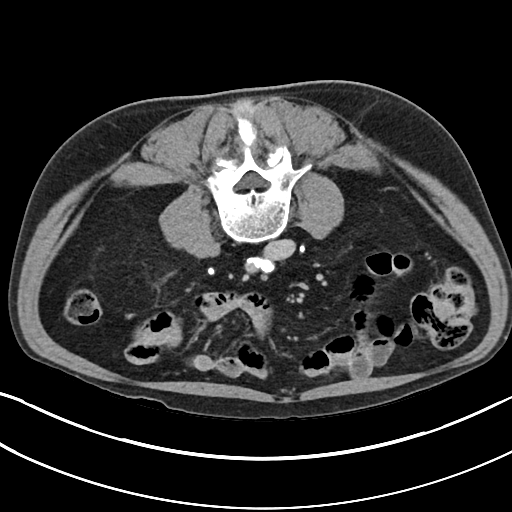
[im 48/95  lung]
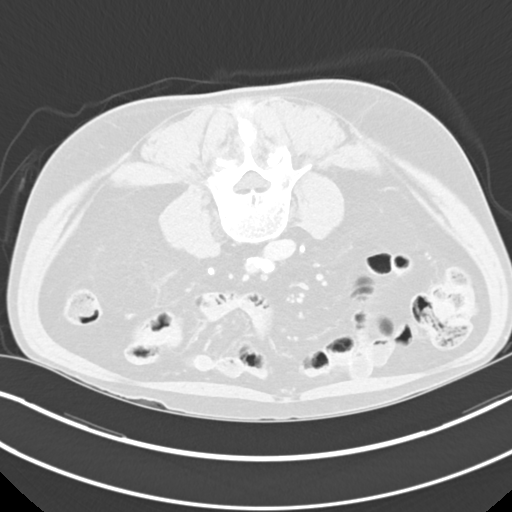
[im 59/95  soft-tissue]
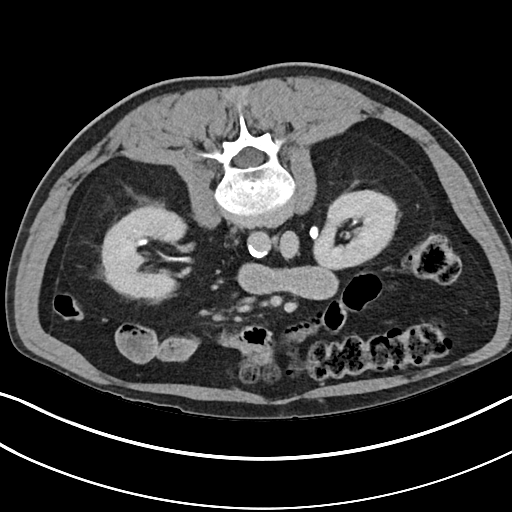
[im 59/95  lung]
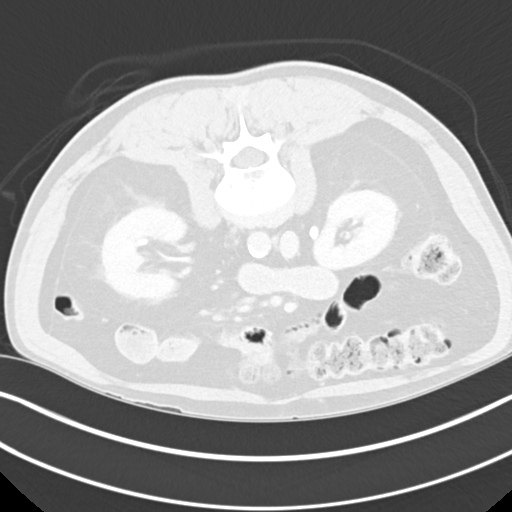
[im 71/95  soft-tissue]
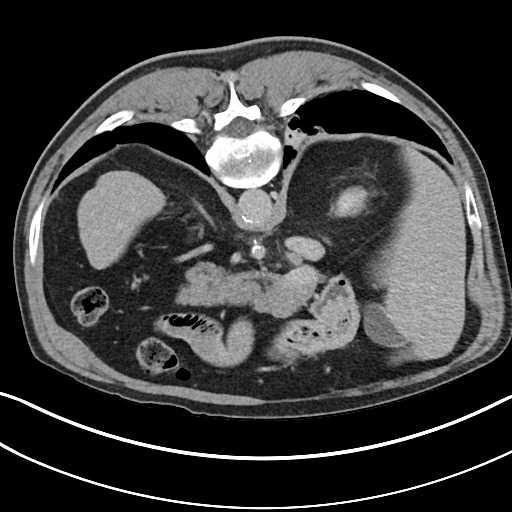
[im 71/95  lung]
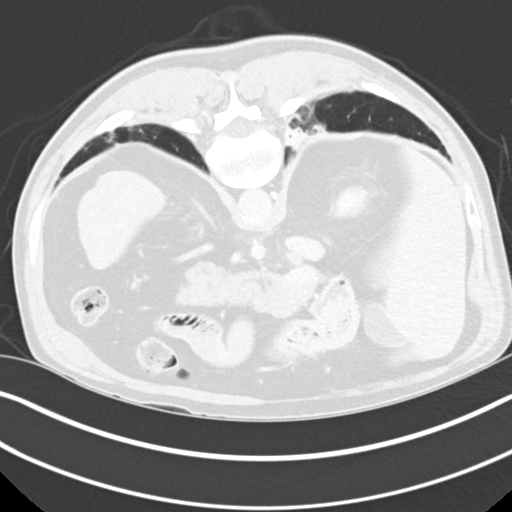
[im 83/95  soft-tissue]
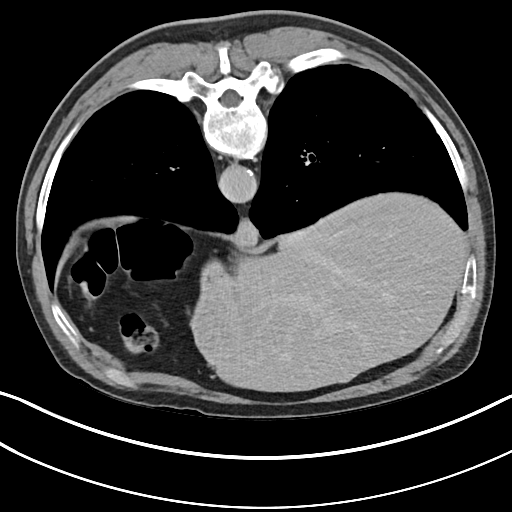
[im 83/95  lung]
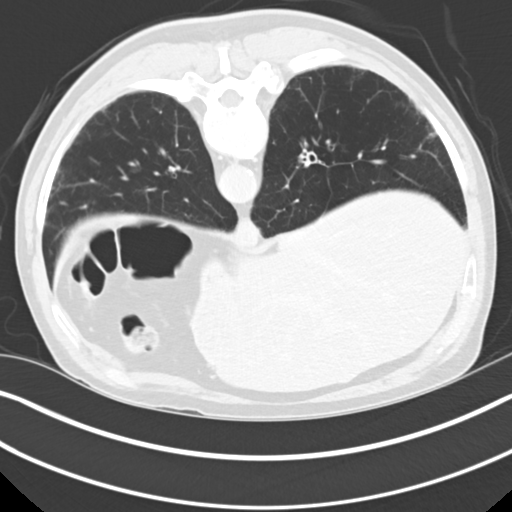

[11 of 46 positions shown; findings below may reference images not displayed]

FINDINGS: Lower chest: Centrilobular emphsyema noted. Bronchial wall
thickening with small airway impaction noted posterior right lung
base with associated small focus of collapse/consolidative opacity.
Associated nodularity in the same region measures up to about 6 mm.

Hepatobiliary: No suspicious focal abnormality within the liver
parenchyma. There is no evidence for gallstones, gallbladder wall
thickening, or pericholecystic fluid. No intrahepatic or
extrahepatic biliary dilation.

Pancreas: No focal mass lesion. No dilatation of the main duct. No
intraparenchymal cyst. No peripancreatic edema.

Spleen: No splenomegaly. No focal mass lesion.

Adrenals/Urinary Tract: No adrenal nodule or mass.

Precontrast imaging shows no stones in either kidney or ureter. No
bladder stones.

Imaging after IV contrast administration shows no suspicious
enhancing abnormality in either kidney. 11 mm well-defined
homogeneous low-density interpolar right renal lesion has
attenuation higher than expected for a simple cyst.

Delayed post-contrast imaging shows no wall thickening or soft
tissue filling defect in either intrarenal collecting system or
renal pelvis. Both ureters are well opacified without evidence for
wall thickening, soft tissue lesion or focal dilatation. Delayed
imaging of the bladder shows no focal wall thickening.

Stomach/Bowel: Stomach is unremarkable. No gastric wall thickening.
No evidence of outlet obstruction. Duodenum is normally positioned
as is the ligament of Treitz. No small bowel wall thickening. No
small bowel dilatation. The terminal ileum is normal. The appendix
is normal. No gross colonic mass. No colonic wall thickening.
Diverticular changes are noted in the left colon without evidence of
diverticulitis.

Vascular/Lymphatic: There is abdominal aortic atherosclerosis
without aneurysm. There is no gastrohepatic or hepatoduodenal
ligament lymphadenopathy. No retroperitoneal or mesenteric
lymphadenopathy. No pelvic sidewall lymphadenopathy.

Reproductive: The prostate gland and seminal vesicles are
unremarkable.

Other: No intraperitoneal free fluid.

Musculoskeletal: No worrisome lytic or sclerotic osseous
abnormality. Tiny umbilical hernia contains only fat.
IMPRESSION: 1. No acute findings in the abdomen or pelvis. Specifically, no
findings to explain the patient's history of hematuria.
2. 11 mm well-defined homogeneous low-density lesion interpolar
right renal lesion has attenuation higher than expected for a simple
cyst. This is likely a cyst complicated by proteinaceous debris or
hemorrhage, but follow-up could be used to ensure stability. MRI of
the abdomen without and with contrast could be used to further
evaluate as clinically warranted.
3. Bronchial wall thickening with small airway impaction posterior
right lung base with associated small focus of
collapse/consolidative opacity and clustered nodularity with nodules
measuring up to 6 mm. Imaging features likely related to atypical
infection although the appropriate clinical setting, aspiration
could have this appearance. Consider follow-up CT chest without
contrast in 3 months after therapy to ensure resolution.
4. Aortic Atherosclerosis (F3VNP-XPM.M) and Emphysema (F3VNP-VGE.5).

## 2022-06-22 DIAGNOSIS — I25119 Atherosclerotic heart disease of native coronary artery with unspecified angina pectoris: Secondary | ICD-10-CM | POA: Diagnosis not present

## 2022-06-22 DIAGNOSIS — J309 Allergic rhinitis, unspecified: Secondary | ICD-10-CM | POA: Diagnosis not present

## 2022-06-22 DIAGNOSIS — I10 Essential (primary) hypertension: Secondary | ICD-10-CM | POA: Diagnosis not present

## 2022-06-22 DIAGNOSIS — E663 Overweight: Secondary | ICD-10-CM | POA: Diagnosis not present

## 2022-06-22 DIAGNOSIS — E785 Hyperlipidemia, unspecified: Secondary | ICD-10-CM | POA: Diagnosis not present

## 2022-06-22 DIAGNOSIS — Z7982 Long term (current) use of aspirin: Secondary | ICD-10-CM | POA: Diagnosis not present

## 2022-06-22 DIAGNOSIS — J449 Chronic obstructive pulmonary disease, unspecified: Secondary | ICD-10-CM | POA: Diagnosis not present

## 2022-06-22 DIAGNOSIS — I739 Peripheral vascular disease, unspecified: Secondary | ICD-10-CM | POA: Diagnosis not present

## 2022-06-22 DIAGNOSIS — J439 Emphysema, unspecified: Secondary | ICD-10-CM | POA: Diagnosis not present

## 2022-06-22 DIAGNOSIS — N401 Enlarged prostate with lower urinary tract symptoms: Secondary | ICD-10-CM | POA: Diagnosis not present

## 2022-06-22 DIAGNOSIS — N529 Male erectile dysfunction, unspecified: Secondary | ICD-10-CM | POA: Diagnosis not present

## 2022-06-22 DIAGNOSIS — K219 Gastro-esophageal reflux disease without esophagitis: Secondary | ICD-10-CM | POA: Diagnosis not present

## 2022-07-14 DIAGNOSIS — E663 Overweight: Secondary | ICD-10-CM | POA: Diagnosis not present

## 2022-07-14 DIAGNOSIS — Z6828 Body mass index (BMI) 28.0-28.9, adult: Secondary | ICD-10-CM | POA: Diagnosis not present

## 2022-07-14 DIAGNOSIS — R030 Elevated blood-pressure reading, without diagnosis of hypertension: Secondary | ICD-10-CM | POA: Diagnosis not present

## 2022-07-14 DIAGNOSIS — U071 COVID-19: Secondary | ICD-10-CM | POA: Diagnosis not present

## 2022-07-16 ENCOUNTER — Ambulatory Visit (INDEPENDENT_AMBULATORY_CARE_PROVIDER_SITE_OTHER): Payer: PPO

## 2022-07-16 VITALS — HR 70 | Ht 66.0 in | Wt 163.0 lb

## 2022-07-16 DIAGNOSIS — Z0000 Encounter for general adult medical examination without abnormal findings: Secondary | ICD-10-CM | POA: Diagnosis not present

## 2022-07-16 NOTE — Patient Instructions (Addendum)
[name] , Thank you for taking time to come for your Medicare Wellness Visit. I appreciate your ongoing commitment to your health goals. Please review the following plan we discussed and let me know if I can assist you in the future.   These are the goals we discussed:  Goals       Patient Stated     Weight (lb) < 155 lb (70.3 kg) (pt-stated)      Other     Maintain healthy lifestyle      Stay active Healthy diet Stay hydrated        This is a list of the screening recommended for you and due dates:  Health Maintenance  Topic Date Due   Pneumonia Vaccine (1 - PCV) Never done   COVID-19 Vaccine (3 - 2023-24 season) 08/01/2022*   Zoster (Shingles) Vaccine (1 of 2) 10/15/2022*   DTaP/Tdap/Td vaccine (2 - Td or Tdap) 03/12/2023   Medicare Annual Wellness Visit  07/17/2023   Flu Shot  Completed   HPV Vaccine  Aged Out  *Topic was postponed. The date shown is not the original due date.    Advanced directives: End of life planning; Advance aging; Advanced directives discussed.  Copy of current HCPOA/Living Will requested.    Next appointment: Follow up in one year for your annual wellness visit.   Preventive Care 46 Years and Older, Male  Preventive care refers to lifestyle choices and visits with your health care provider that can promote health and wellness. What does preventive care include? A yearly physical exam. This is also called an annual well check. Dental exams once or twice a year. Routine eye exams. Ask your health care provider how often you should have your eyes checked. Personal lifestyle choices, including: Daily care of your teeth and gums. Regular physical activity. Eating a healthy diet. Avoiding tobacco and drug use. Limiting alcohol use. Practicing safe sex. Taking low doses of aspirin every day. Taking vitamin and mineral supplements as recommended by your health care provider. What happens during an annual well check? The services and screenings  done by your health care provider during your annual well check will depend on your age, overall health, lifestyle risk factors, and family history of disease. Counseling  Your health care provider may ask you questions about your: Alcohol use. Tobacco use. Drug use. Emotional well-being. Home and relationship well-being. Sexual activity. Eating habits. History of falls. Memory and ability to understand (cognition). Work and work Statistician. Screening  You may have the following tests or measurements: Height, weight, and BMI. Blood pressure. Lipid and cholesterol levels. These may be checked every 5 years, or more frequently if you are over 52 years old. Skin check. Lung cancer screening. You may have this screening every year starting at age 35 if you have a 30-pack-year history of smoking and currently smoke or have quit within the past 15 years. Fecal occult blood test (FOBT) of the stool. You may have this test every year starting at age 20. Flexible sigmoidoscopy or colonoscopy. You may have a sigmoidoscopy every 5 years or a colonoscopy every 10 years starting at age 92. Prostate cancer screening. Recommendations will vary depending on your family history and other risks. Hepatitis C blood test. Hepatitis B blood test. Sexually transmitted disease (STD) testing. Diabetes screening. This is done by checking your blood sugar (glucose) after you have not eaten for a while (fasting). You may have this done every 1-3 years. Abdominal aortic aneurysm (AAA) screening.  You may need this if you are a current or former smoker. Osteoporosis. You may be screened starting at age 98 if you are at high risk. Talk with your health care provider about your test results, treatment options, and if necessary, the need for more tests. Vaccines  Your health care provider may recommend certain vaccines, such as: Influenza vaccine. This is recommended every year. Tetanus, diphtheria, and acellular  pertussis (Tdap, Td) vaccine. You may need a Td booster every 10 years. Zoster vaccine. You may need this after age 42. Pneumococcal 13-valent conjugate (PCV13) vaccine. One dose is recommended after age 82. Pneumococcal polysaccharide (PPSV23) vaccine. One dose is recommended after age 75. Talk to your health care provider about which screenings and vaccines you need and how often you need them. This information is not intended to replace advice given to you by your health care provider. Make sure you discuss any questions you have with your health care provider. Document Released: 08/12/2015 Document Revised: 04/04/2016 Document Reviewed: 05/17/2015 Elsevier Interactive Patient Education  2017 [place] Prevention in the Home Falls can cause injuries. They can happen to people of all ages. There are many things you can do to make your home safe and to help prevent falls. What can I do on the outside of my home? Regularly fix the edges of walkways and driveways and fix any cracks. Remove anything that might make you trip as you walk through a door, such as a raised step or threshold. Trim any bushes or trees on the path to your home. Use bright outdoor lighting. Clear any walking paths of anything that might make someone trip, such as rocks or tools. Regularly check to see if handrails are loose or broken. Make sure that both sides of any steps have handrails. Any raised decks and porches should have guardrails on the edges. Have any leaves, snow, or ice cleared regularly. Use sand or salt on walking paths during winter. Clean up any spills in your garage right away. This includes oil or grease spills. What can I do in the bathroom? Use night lights. Install grab bars by the toilet and in the tub and shower. Do not use towel bars as grab bars. Use non-skid mats or decals in the tub or shower. If you need to sit down in the shower, use a plastic, non-slip stool. Keep the floor  dry. Clean up any water that spills on the floor as soon as it happens. Remove soap buildup in the tub or shower regularly. Attach bath mats securely with double-sided non-slip rug tape. Do not have throw rugs and other things on the floor that can make you trip. What can I do in the bedroom? Use night lights. Make sure that you have a light by your bed that is easy to reach. Do not use any sheets or blankets that are too big for your bed. They should not hang down onto the floor. Have a firm chair that has side arms. You can use this for support while you get dressed. Do not have throw rugs and other things on the floor that can make you trip. What can I do in the kitchen? Clean up any spills right away. Avoid walking on wet floors. Keep items that you use a lot in easy-to-reach places. If you need to reach something above you, use a strong step stool that has a grab bar. Keep electrical cords out of the way. Do not use floor polish or wax  that makes floors slippery. If you must use wax, use non-skid floor wax. Do not have throw rugs and other things on the floor that can make you trip. What can I do with my stairs? Do not leave any items on the stairs. Make sure that there are handrails on both sides of the stairs and use them. Fix handrails that are broken or loose. Make sure that handrails are as long as the stairways. Check any carpeting to make sure that it is firmly attached to the stairs. Fix any carpet that is loose or worn. Avoid having throw rugs at the top or bottom of the stairs. If you do have throw rugs, attach them to the floor with carpet tape. Make sure that you have a light switch at the top of the stairs and the bottom of the stairs. If you do not have them, ask someone to add them for you. What else can I do to help prevent falls? Wear shoes that: Do not have high heels. Have rubber bottoms. Are comfortable and fit you well. Are closed at the toe. Do not wear  sandals. If you use a stepladder: Make sure that it is fully opened. Do not climb a closed stepladder. Make sure that both sides of the stepladder are locked into place. Ask someone to hold it for you, if possible. Clearly mark and make sure that you can see: Any grab bars or handrails. First and last steps. Where the edge of each step is. Use tools that help you move around (mobility aids) if they are needed. These include: Canes. Walkers. Scooters. Crutches. Turn on the lights when you go into a dark area. Replace any light bulbs as soon as they burn out. Set up your furniture so you have a clear path. Avoid moving your furniture around. If any of your floors are uneven, fix them. If there are any pets around you, be aware of where they are. Review your medicines with your doctor. Some medicines can make you feel dizzy. This can increase your chance of falling. Ask your doctor what other things that you can do to help prevent falls. This information is not intended to replace advice given to you by your health care provider. Make sure you discuss any questions you have with your health care provider. Document Released: 05/12/2009 Document Revised: 12/22/2015 Document Reviewed: 08/20/2014 Elsevier Interactive Patient Education  2017 [employer].

## 2022-07-16 NOTE — Progress Notes (Signed)
Subjective:   James Peck is a 81 y.o. male who presents for Medicare Annual/Subsequent preventive examination.  Review of Systems    No ROS.  Medicare Wellness Virtual Visit.  Visual/audio telehealth visit, UTA vital signs.   See social history for additional risk factors.   Cardiac Risk Factors include: advanced age (>39mn, >>70women);male gender     Objective:    Today's Vitals   07/16/22 0955  Pulse: 70  SpO2: 95%  Weight: 163 lb (73.9 kg)  Height: '5\' 6"'$  (1.676 m)   Body mass index is 26.31 kg/m.     07/16/2022    9:56 AM 12/12/2021   10:18 AM 07/04/2021    1:31 PM 11/04/2020   10:19 AM 09/12/2020   12:00 PM 09/12/2020    7:45 AM 09/11/2020   11:39 AM  Advanced Directives  Does Patient Have a Medical Advance Directive? Yes Yes Yes Yes Yes Yes Yes  Type of AParamedicof AJolietLiving will Healthcare Power of AKing [place] Does patient want to make changes to medical advance directive? No - Patient declined  No - Patient declined Yes (MAU/Ambulatory/Procedural Areas - Information given) No - Patient declined    Copy of HMelissain Chart?   No - copy requested  No - copy requested No - copy requested     Current Medications (verified) Outpatient Encounter Medications as of 07/16/2022  Medication Sig   Nirmatrelvir-Ritonavir (PAXLOVID, 150/100, PO) Take 2 tablets by mouth in the morning and at bedtime.   aspirin EC 81 MG tablet Take 81 mg by mouth daily.    calcium carbonate (OS-CAL - DOSED IN MG OF ELEMENTAL CALCIUM) 1250 (500 CA) MG tablet Take 1 tablet by mouth daily.    cetirizine (ZYRTEC) 10 MG tablet TAKE 1 TABLET BY MOUTH DAILY   chlorpheniramine (CHLOR-TRIMETON) 4 MG tablet Take 4 mg by mouth 2 (two) times daily as needed for allergies.   CINNAMON PO Take 1,200 mg by mouth 2 times daily at 12 noon and 4 pm.    clopidogrel (PLAVIX) 75 MG tablet Take 1 tablet (75 mg total) by mouth daily.   dextromethorphan-guaiFENesin (MUCINEX DM) 30-600 MG 12hr tablet Take 1 tablet by mouth 2 (two) times daily.   famotidine (PEPCID) 20 MG tablet TAKE 1 TABLET BY MOUTH DAILY   finasteride (PROSCAR) 5 MG tablet TAKE 1 TABLET BY MOUTH DAILY.   fluticasone (FLONASE) 50 MCG/ACT nasal spray TAKE 2 SPRAYS INTO BOTH NOSTRILS DAILY   GLUCOSAMINE-CHONDROITIN PO Take 1 capsule by mouth daily.   Misc Natural Products (BLACK CHERRY CONCENTRATE PO) Take 1,000 mg daily by mouth.   nitroGLYCERIN (NITROSTAT) 0.4 MG SL tablet Place 1 tablet (0.4 mg total) under the tongue every 5 (five) minutes as needed for chest pain.   Omega-3 Fatty Acids (FISH OIL) 1000 MG CAPS Take 1,000 mg by mouth daily.    rosuvastatin (CRESTOR) 20 MG tablet Take 1 tablet (20 mg total) by mouth daily.   sildenafil (REVATIO) 20 MG tablet TAKE 1 TO 2 TABLETS BY MOUTH EVERY DAY AS NEEDED   telmisartan (MICARDIS) 40 MG tablet Take 1 tablet (40 mg total) by mouth daily.   TRELEGY ELLIPTA 100-62.5-25 MCG/ACT AEPB INHALE 1 PUFF INTO THE LUNGS DAILY.   No facility-administered encounter medications on file as of 07/16/2022.    Allergies (verified) Ace inhibitors and Enalapril  History: Past Medical History:  Diagnosis Date   Allergy    Arthritis    Coronary artery disease    GERD (gastroesophageal reflux disease)    History of hiatal hernia    Hypercholesteremia    Hypertension    Past Surgical History:  Procedure Laterality Date   CARDIAC CATHETERIZATION     cardiac stents     CATARACT EXTRACTION W/PHACO Right 09/10/2017   Procedure: CATARACT EXTRACTION PHACO AND INTRAOCULAR LENS PLACEMENT ([place]);  Surgeon: Birder Robson, MD;  Location: [facility] ORS;  Service: Ophthalmology;  Laterality: Right;  [place] 01:01.7 AP% 15.3 CDE 9.38 Fluid pack lot # [id number]   CATARACT EXTRACTION W/PHACO Left 10/02/2017   Procedure: CATARACT EXTRACTION PHACO AND  INTRAOCULAR LENS PLACEMENT ([facility]);  Surgeon: Birder Robson, MD;  Location: [facility] ORS;  Service: Ophthalmology;  Laterality: Left;  [place] 00:42.3 AP% 16.2 CDE 6.85 Fluid Pack Lot # [id number]   COLONOSCOPY WITH PROPOFOL N/A 06/28/2017   Procedure: COLONOSCOPY WITH PROPOFOL;  Surgeon: Manya Silvas, MD;  Location: [facility] ENDOSCOPY;  Service: Endoscopy;  Laterality: N/A;   COLONOSCOPY WITH PROPOFOL N/A 12/12/2021   Procedure: COLONOSCOPY WITH PROPOFOL;  Surgeon: Lesly Rubenstein, MD;  Location: [facility] ENDOSCOPY;  Service: Endoscopy;  Laterality: N/A;   CORONARY ANGIOPLASTY     STENTS X 2   EYE SURGERY     KNEE ARTHROSCOPY     LEFT HEART CATH AND CORONARY ANGIOGRAPHY N/A 09/12/2020   Procedure: LEFT HEART CATH AND CORONARY ANGIOGRAPHY;  Surgeon: Isaias Cowman, MD;  Location: [facility];  Service: Cardiovascular;  Laterality: N/A;   Family History  Problem Relation Age of Onset   Heart disease Mother    Diabetes Mother    Heart disease Father    Diabetes Father    Alzheimer's disease Brother    Stroke Brother    Social History   Socioeconomic History   Marital status: Married    Spouse name: Not on file   Number of children: Not on file   Years of education: Not on file   Highest education level: Not on file  Occupational History   Not on file  Tobacco Use   Smoking status: Former    Packs/day: 1.00    Years: 40.00    Total pack years: 40.00    Types: Cigarettes    Quit date: 2000    Years since quitting: 23.9   Smokeless tobacco: Never  Vaping Use   Vaping Use: Never used  Substance and Sexual Activity   Alcohol use: No   Drug use: No   Sexual activity: Not on file  Other Topics Concern   Not on file  Social History Narrative   Not on file   Social Determinants of Health   Financial Resource Strain: Low Risk  (07/16/2022)   Overall Financial Resource Strain (CARDIA)    Difficulty of Paying Living Expenses: Not hard at all  Food Insecurity: No  Food Insecurity (07/16/2022)   Hunger Vital Sign    Worried About [place] in the Last Year: Never true    [place] in the Last Year: Never true  Transportation Needs: No Transportation Needs (07/16/2022)   PRAPARE - Hydrologist (Medical): No    Lack of Transportation (Non-Medical): No  Physical Activity: Sufficiently Active (07/16/2022)   Exercise Vital Sign    Days of Exercise per Week: 5 days    Minutes of Exercise per Session: 30 min  Stress: No Stress Concern Present (07/16/2022)   [place]    Feeling of Stress : Not at all  Social Connections: Unknown (07/16/2022)   Social Connection and Isolation Panel [NHANES]    Frequency of Communication with Friends and Family: Not on file    Frequency of Social Gatherings with Friends and Family: Not on file    Attends Religious Services: Not on Advertising copywriter or Organizations: Not on file    Attends Archivist Meetings: Not on file    Marital Status: Married    Tobacco Counseling Counseling given: Not Answered   Clinical Intake:  Pre-visit preparation completed: Yes        Diabetes: No  How often do you need to have someone help you when you read instructions, pamphlets, or other written materials from your doctor or pharmacy?: 1 - Never    Interpreter Needed?: No    Activities of Daily Living    07/16/2022   10:03 AM  In your present state of health, do you have any difficulty performing the following activities:  Hearing? 0  Vision? 0  Difficulty concentrating or making decisions? 0  Walking or climbing stairs? 0  Dressing or bathing? 0  Doing errands, shopping? 0  Preparing Food and eating ? N  Using the Toilet? N  In the past six months, have you accidently leaked urine? N  Do you have problems with loss of bowel control? N  Managing your Medications? N  Managing  your Finances? N  Housekeeping or managing your Housekeeping? N    Patient Care Team: Einar Pheasant, MD as PCP - General (Internal Medicine)  Indicate any recent Medical Services you may have received from other than [facility] providers in the past year (date may be approximate).     Assessment:   This is a routine wellness examination for [place].  I connected with  Amparo Bristol on 07/16/22 by a audio enabled telemedicine application and verified that I am speaking with the correct person using two identifiers.  Patient Location: Home  Provider Location: Office/Clinic  I discussed the limitations of evaluation and management by telemedicine. The patient expressed understanding and agreed to proceed.   Hearing/Vision screen Hearing Screening - Comments:: Patient is able to hear conversational tones without difficulty.  No issues reported.   Vision Screening - Comments:: Followed [facility], Dr. George Ina Cataract extracted, bilateral  Dietary issues and exercise activities discussed: Current Exercise Habits: Home exercise routine, Type of exercise: walking, Time (Minutes): 30, Frequency (Times/Week): 5, Weekly Exercise (Minutes/Week): 150, Intensity: Mild   Goals Addressed             This Visit's Progress    Maintain healthy lifestyle       Stay active Healthy diet Stay hydrated       Depression Screen    07/16/2022   10:21 AM 07/16/2022   10:03 AM 05/24/2022    9:14 AM 03/20/2022    4:23 PM 07/04/2021    1:29 PM 01/20/2021    9:52 AM 12/07/2020   10:13 AM  PHQ 2/9 Scores  PHQ - 2 Score 0 0 0 0 0 0 0  PHQ- 9 Score      0 0    Fall Risk    07/16/2022   10:02 AM 05/24/2022    9:13 AM 03/20/2022    4:23 PM 03/08/2022    5:04 PM 07/05/2021  12:55 PM  [place] in the past year? 0 0 0 0 0  Number falls in past yr: 0 0 0  0  Injury with Fall? 0 0 0  0  Risk for fall due to : No Fall Risks No Fall Risks No Fall Risks No Fall Risks No Fall Risks   Follow up Falls evaluation completed;Falls prevention discussed Falls evaluation completed Falls evaluation completed Falls evaluation completed Falls evaluation completed    FALL RISK PREVENTION PERTAINING TO THE HOME: Home free of loose throw rugs in walkways, pet beds, electrical cords, etc? Yes  Adequate lighting in your home to reduce risk of falls? Yes   ASSISTIVE DEVICES UTILIZED TO PREVENT FALLS: Life alert? No  Use of a cane, walker or w/c? No  Grab bars in the bathroom? Yes Shower chair or bench in shower? Yes Comfort chair height toilet? Yes  TIMED UP AND GO: Was the test performed? No .   Cognitive Function:    06/24/2018    9:20 AM  MMSE - Mini Mental State Exam  Orientation to time 5  Orientation to Place 5  Registration 3  Attention/ Calculation 5  Recall 3  Language- name 2 objects 2  Language- repeat 1  Language- follow 3 step command 3  Language- read & follow direction 1  Write a sentence 1  Copy design 1  Total score 30        07/16/2022   10:05 AM 07/04/2021    1:36 PM 07/01/2020    1:43 PM 06/30/2019    8:50 AM  6CIT Screen  What Year? 0 points 0 points 0 points 0 points  What month? 0 points 0 points 0 points 0 points  What time? 0 points 0 points 0 points 0 points  Count back from 20 0 points 0 points  0 points  Months in reverse 0 points 0 points 0 points 0 points  Repeat phrase 0 points 0 points 2 points 0 points  Total Score 0 points 0 points  0 points    Immunizations Immunization History  Administered Date(s) Administered   Fluad Quad(high Dose 65+) 04/09/2019, 04/04/2021   Influenza Split 05/11/2014   Influenza, High Dose Seasonal PF 04/23/2017, 04/29/2018, 04/09/2019, 05/05/2020, 05/10/2022   Influenza-Unspecified 04/25/2015, 04/23/2017   PFIZER(Purple Top)SARS-COV-2 Vaccination 08/28/2019, 09/18/2019   Tdap 03/11/2013   Pneumococcal vaccine status: Due, Education has been provided regarding the importance of this vaccine.  Advised may receive this vaccine at local pharmacy or Health Dept. Aware to provide a copy of the vaccination record if obtained from local pharmacy or Health Dept. Verbalized acceptance and understanding.   Shingrix Completed?: No.    Education has been provided regarding the importance of this vaccine. Patient has been advised to call insurance company to determine out of pocket expense if they have not yet received this vaccine. Advised may also receive vaccine at local pharmacy or Health Dept. Verbalized acceptance and understanding.  Screening Tests Health Maintenance  Topic Date Due   Pneumonia Vaccine 22+ Years old (1 - PCV) Never done   COVID-19 Vaccine (3 - 2023-24 season) 08/01/2022 (Originally 03/30/2022)   Zoster Vaccines- Shingrix (1 of 2) 10/15/2022 (Originally 09/12/1991)   DTaP/Tdap/Td (2 - Td or Tdap) 03/12/2023   Medicare Annual Wellness (AWV)  07/17/2023   INFLUENZA VACCINE  Completed   HPV VACCINES  Aged Out   Health Maintenance Health Maintenance Due  Topic Date Due   Pneumonia Vaccine 73+ Years old (  1 - PCV) Never done   Lung Cancer Screening: (Low Dose CT Chest recommended if Age 8-80 years, 30 pack-year currently smoking OR have quit w/in 15years.) does not qualify.   Hepatitis C Screening: does not qualify.  Vision Screening: Recommended annual ophthalmology exams for early detection of glaucoma and other disorders of the eye.  Dental Screening: Recommended annual dental exams for proper oral hygiene.  Community Resource Referral / Chronic Care Management: CRR required this visit?  No   CCM required this visit?  No      Plan:     I have personally reviewed and noted the following in the patient's chart:   Medical and social history Use of alcohol, tobacco or illicit drugs  Current medications and supplements including opioid prescriptions. Patient is not currently taking opioid prescriptions. Functional ability and status Nutritional status Physical  activity Advanced directives List of other physicians Hospitalizations, surgeries, and ER visits in previous 12 months Vitals Screenings to include cognitive, depression, and falls Referrals and appointments  In addition, I have reviewed and discussed with patient certain preventive protocols, quality metrics, and best practice recommendations. A written personalized care plan for preventive services as well as general preventive health recommendations were provided to patient.     Leta Jungling, LPN   [id number]

## 2022-07-19 ENCOUNTER — Telehealth (INDEPENDENT_AMBULATORY_CARE_PROVIDER_SITE_OTHER): Payer: PPO | Admitting: Internal Medicine

## 2022-07-19 ENCOUNTER — Encounter: Payer: Self-pay | Admitting: Internal Medicine

## 2022-07-19 VITALS — BP 135/63 | Ht 66.0 in | Wt 163.0 lb

## 2022-07-19 DIAGNOSIS — I10 Essential (primary) hypertension: Secondary | ICD-10-CM | POA: Diagnosis not present

## 2022-07-19 DIAGNOSIS — J4489 Other specified chronic obstructive pulmonary disease: Secondary | ICD-10-CM

## 2022-07-19 DIAGNOSIS — J479 Bronchiectasis, uncomplicated: Secondary | ICD-10-CM

## 2022-07-19 DIAGNOSIS — E785 Hyperlipidemia, unspecified: Secondary | ICD-10-CM | POA: Diagnosis not present

## 2022-07-19 DIAGNOSIS — I2510 Atherosclerotic heart disease of native coronary artery without angina pectoris: Secondary | ICD-10-CM

## 2022-07-19 DIAGNOSIS — K219 Gastro-esophageal reflux disease without esophagitis: Secondary | ICD-10-CM | POA: Diagnosis not present

## 2022-07-19 DIAGNOSIS — R739 Hyperglycemia, unspecified: Secondary | ICD-10-CM | POA: Diagnosis not present

## 2022-07-19 DIAGNOSIS — U071 COVID-19: Secondary | ICD-10-CM | POA: Diagnosis not present

## 2022-07-19 DIAGNOSIS — I779 Disorder of arteries and arterioles, unspecified: Secondary | ICD-10-CM

## 2022-07-19 MED ORDER — PREDNISONE 10 MG PO TABS: ORAL_TABLET | ORAL | 0 refills | Status: DC

## 2022-07-19 NOTE — Progress Notes (Signed)
Patient ID: James Peck, male   DOB: June 19, 1941, 81 y.o.   MRN: [id number]   Virtual Visit via video Note  This visit type was conducted due to national recommendations for restrictions regarding the COVID-19 pandemic (e.g. social distancing).  This format is felt to be most appropriate for this patient at this time.  All issues noted in this document were discussed and addressed.  No physical exam was performed (except for noted visual exam findings with Video Visits).   I connected with James Peck by a video enabled telemedicine application and verified that I am speaking with the correct person using two identifiers. Location patient: home Location provider: work Persons participating in the virtual visit: patient, provider  The limitations, risks, security and privacy concerns of performing an evaluation and management service by vidoe and the availability of in person appointments have been discussed.  It has also been discussed with the patient that there may be a patient responsible charge related to this service. The patient expressed understanding and agreed to proceed.   Reason for visit: follow up appt  HPI: Here to follow up regarding his blood pressure, cholesterol and CAD.  Recent diagnosed with covid.  Reports that starting 07/13/22 - developed aching and stiffness.  No chest pain or sob.  Some increased congestion - yellow mucus.  Increased cough.  No nausea or vomiting.  No diarrhea.  Was started on paxlovid.  Also taking robitussin, tylenol and using flonase.  Using trelegy.  Still with increased cough.  Some coughing fits. 95% - pulse ox.  Blood pressure doing well.     ROS: See pertinent positives and negatives per HPI.  Past Medical History:  Diagnosis Date   Allergy    Arthritis    Coronary artery disease    GERD (gastroesophageal reflux disease)    History of hiatal hernia    Hypercholesteremia    Hypertension     Past Surgical History:  Procedure Laterality  Date   CARDIAC CATHETERIZATION     cardiac stents     CATARACT EXTRACTION W/PHACO Right 09/10/2017   Procedure: CATARACT EXTRACTION PHACO AND INTRAOCULAR LENS PLACEMENT ([place]);  Surgeon: Birder Robson, MD;  Location: [facility] ORS;  Service: Ophthalmology;  Laterality: Right;  [place] 01:01.7 AP% 15.3 CDE 9.38 Fluid pack lot # [id number]   CATARACT EXTRACTION W/PHACO Left 10/02/2017   Procedure: CATARACT EXTRACTION PHACO AND INTRAOCULAR LENS PLACEMENT ([facility]);  Surgeon: Birder Robson, MD;  Location: [facility];  Service: Ophthalmology;  Laterality: Left;  [place] 00:42.3 AP% 16.2 CDE 6.85 Fluid Pack Lot # [id number]   COLONOSCOPY WITH PROPOFOL N/A 06/28/2017   Procedure: COLONOSCOPY WITH PROPOFOL;  Surgeon: Manya Silvas, MD;  Location: [facility] ENDOSCOPY;  Service: Endoscopy;  Laterality: N/A;   COLONOSCOPY WITH PROPOFOL N/A 12/12/2021   Procedure: COLONOSCOPY WITH PROPOFOL;  Surgeon: Lesly Rubenstein, MD;  Location: [facility] ENDOSCOPY;  Service: Endoscopy;  Laterality: N/A;   CORONARY ANGIOPLASTY     STENTS X 2   EYE SURGERY     KNEE ARTHROSCOPY     LEFT HEART CATH AND CORONARY ANGIOGRAPHY N/A 09/12/2020   Procedure: LEFT HEART CATH AND CORONARY ANGIOGRAPHY;  Surgeon: Isaias Cowman, MD;  Location: [place] [facility];  Service: Cardiovascular;  Laterality: N/A;    Family History  Problem Relation Age of Onset   Heart disease Mother    Diabetes Mother    Heart disease Father    Diabetes Father    Alzheimer's disease Brother    Stroke  Brother     SOCIAL HX: reviewed.    Current Outpatient Medications:    aspirin EC 81 MG tablet, Take 81 mg by mouth daily. , Disp: , Rfl:    calcium carbonate (OS-CAL - DOSED IN MG OF ELEMENTAL CALCIUM) 1250 (500 CA) MG tablet, Take 1 tablet by mouth daily. , Disp: , Rfl:    cetirizine (ZYRTEC) 10 MG tablet, TAKE 1 TABLET BY MOUTH DAILY, Disp: 90 tablet, Rfl: 1   chlorpheniramine (CHLOR-TRIMETON) 4 MG tablet, Take 4 mg by mouth 2 (two) times daily as  needed for allergies., Disp: , Rfl:    CINNAMON PO, Take 1,200 mg by mouth 2 times daily at 12 noon and 4 pm., Disp: , Rfl:    dextromethorphan-guaiFENesin (MUCINEX DM) 30-600 MG 12hr tablet, Take 1 tablet by mouth 2 (two) times daily., Disp: , Rfl:    famotidine (PEPCID) 20 MG tablet, TAKE 1 TABLET BY MOUTH DAILY, Disp: 90 tablet, Rfl: 1   finasteride (PROSCAR) 5 MG tablet, TAKE 1 TABLET BY MOUTH DAILY., Disp: 30 tablet, Rfl: 3   fluticasone (FLONASE) 50 MCG/ACT nasal spray, TAKE 2 SPRAYS INTO BOTH NOSTRILS DAILY, Disp: 16 g, Rfl: 3   GLUCOSAMINE-CHONDROITIN PO, Take 1 capsule by mouth daily., Disp: , Rfl:    Misc Natural Products (BLACK CHERRY CONCENTRATE PO), Take 1,000 mg daily by mouth., Disp: , Rfl:    Nirmatrelvir-Ritonavir (PAXLOVID, 150/100, PO), Take 2 tablets by mouth in the morning and at bedtime., Disp: , Rfl:    nitroGLYCERIN (NITROSTAT) 0.4 MG SL tablet, Place 1 tablet (0.4 mg total) under the tongue every 5 (five) minutes as needed for chest pain., Disp: 30 tablet, Rfl: 0   Omega-3 Fatty Acids (FISH OIL) 1000 MG CAPS, Take 1,000 mg by mouth daily. , Disp: , Rfl:    predniSONE (DELTASONE) 10 MG tablet, Take 6 tablets x 1 day and then decrease by 1/2 tablet per day until down to zero mg., Disp: 38 tablet, Rfl: 0   rosuvastatin (CRESTOR) 20 MG tablet, Take 1 tablet (20 mg total) by mouth daily., Disp: 30 tablet, Rfl: 0   sildenafil (REVATIO) 20 MG tablet, TAKE 1 TO 2 TABLETS BY MOUTH EVERY DAY AS NEEDED, Disp: 60 tablet, Rfl: 0   telmisartan (MICARDIS) 40 MG tablet, Take 1 tablet (40 mg total) by mouth daily., Disp: 30 tablet, Rfl: 3   TRELEGY ELLIPTA 100-62.5-25 MCG/ACT AEPB, INHALE 1 PUFF INTO THE LUNGS DAILY., Disp: 60 each, Rfl: 5   Fluticasone-Umeclidin-Vilant (TRELEGY ELLIPTA) 100-62.5-25 MCG/ACT AEPB, Inhale 1 puff into the lungs daily., Disp: 14 each, Rfl: 0  EXAM:  VITALS per patient if applicable: 500/93, 81%  GENERAL: alert, oriented, appears well and in no acute  distress  HEENT: atraumatic, conjunttiva clear, no obvious abnormalities on inspection of external nose and ears  NECK: normal movements of the head and neck  LUNGS: on inspection no signs of respiratory distress, breathing rate appears normal.  Increased cough with forced expiration.   CV: no obvious cyanosis  PSYCH/NEURO: pleasant and cooperative, no obvious depression or anxiety, speech and thought processing grossly intact  ASSESSMENT AND PLAN:  Discussed the following assessment and plan:  Problem List Items Addressed This Visit     Acid reflux - Primary    Controlled on prilosec.        Asthma-COPD overlap syndrome    Saw pulmonary 01/2022.  Recommended f/u 6 months with PFTs prior. Treat current infection as outlined.       Relevant  Medications   predniSONE (DELTASONE) 10 MG tablet   Benign essential HTN    micardis recently increased to 25m q day.  Continue.  Follow pressures.  Follow metabolic panel.       [place]    Established with pulmonary.  Continue trelegy.   Treat current infection as outlined.       CAD in native artery    Previously admitted with NSTEMI 08/2020.  09/12/20 - cath (50% mid/distal LAD).  No chest pain now.  Followed by cardiology.  Continue micardis. Continue crestor.       Carotid artery disease (HCC)    Carotid ultrasound <40%.  Saw [facility].  Recommended f/u in 12 months.       COVID-19 virus infection    Symptoms as outlined.  Already treated with paxlovid.  With persistent increased cough and congestion.  Continue trelegy.  Continue saline nasal spray/steroid nasal spray.  Robitussin/mucinex.  Prednisone taper.  Follow closely.  Call with update.       HLD (hyperlipidemia)    On crestor.  Low cholesterol diet and exercise.  Follow lipid panel and liver function tests       Relevant Orders   CBC with Differential/Platelet   Basic metabolic panel   Hepatic function panel   Lipid panel    Hyperglycemia    Low carb diet and exercise. Follow met b and a1c.   Lab Results  Component Value Date   HGBA1C 6.4 05/22/2022       Relevant Orders   Hemoglobin A1c    Return in about 8 weeks (around 09/13/2022) for follow up appt (358m).   I discussed the assessment and treatment plan with the patient. The patient was provided an opportunity to ask questions and all were answered. The patient agreed with the plan and demonstrated an understanding of the instructions.   The patient was advised to call back or seek an in-person evaluation if the symptoms worsen or if the condition fails to improve as anticipated.    ChEinar PheasantMD

## 2022-07-24 ENCOUNTER — Telehealth: Payer: Self-pay | Admitting: Pulmonary Disease

## 2022-07-24 MED ORDER — TRELEGY ELLIPTA 100-62.5-25 MCG/ACT IN AEPB: 1.0000 | INHALATION_SPRAY | Freq: Every day | RESPIRATORY_TRACT | 0 refills | Status: DC

## 2022-07-24 NOTE — Telephone Encounter (Signed)
Sample left at the front desk for the patient to pick up. I called and notified the patient.  Nothing further needed.

## 2022-07-29 ENCOUNTER — Encounter: Payer: Self-pay | Admitting: Internal Medicine

## 2022-07-29 NOTE — Assessment & Plan Note (Signed)
Symptoms as outlined.  Already treated with paxlovid.  With persistent increased cough and congestion.  Continue trelegy.  Continue saline nasal spray/steroid nasal spray.  Robitussin/mucinex.  Prednisone taper.  Follow closely.  Call with update.

## 2022-07-29 NOTE — Assessment & Plan Note (Signed)
Carotid ultrasound <40%.  Saw [facility].  Recommended f/u in 12 months.

## 2022-07-29 NOTE — Assessment & Plan Note (Signed)
Controlled on prilosec.   

## 2022-07-29 NOTE — Assessment & Plan Note (Signed)
On crestor.  Low cholesterol diet and exercise.  Follow lipid panel and liver function tests.   

## 2022-07-29 NOTE — Assessment & Plan Note (Signed)
Saw pulmonary 01/2022.  Recommended f/u 6 months with PFTs prior. Treat current infection as outlined.

## 2022-07-29 NOTE — Assessment & Plan Note (Signed)
micardis recently increased to '40mg'$  q day.  Continue.  Follow pressures.  Follow metabolic panel.

## 2022-07-29 NOTE — Assessment & Plan Note (Signed)
Established with pulmonary.  Continue trelegy.   Treat current infection as outlined.

## 2022-07-29 NOTE — Assessment & Plan Note (Signed)
Low carb diet and exercise. Follow met b and a1c.   Lab Results  Component Value Date   HGBA1C 6.4 05/22/2022

## 2022-07-29 NOTE — Assessment & Plan Note (Signed)
Previously admitted with NSTEMI 08/2020.  09/12/20 - cath (50% mid/distal LAD).  No chest pain now.  Followed by cardiology.  Continue micardis. Continue crestor.

## 2022-07-31 ENCOUNTER — Ambulatory Visit: Payer: PPO | Admitting: Pulmonary Disease

## 2022-07-31 ENCOUNTER — Encounter: Payer: Self-pay | Admitting: Pulmonary Disease

## 2022-07-31 VITALS — BP 118/70 | HR 72 | Temp 97.1°F | Ht 66.0 in | Wt 168.2 lb

## 2022-07-31 DIAGNOSIS — Z87891 Personal history of nicotine dependence: Secondary | ICD-10-CM | POA: Diagnosis not present

## 2022-07-31 DIAGNOSIS — J479 Bronchiectasis, uncomplicated: Secondary | ICD-10-CM | POA: Diagnosis not present

## 2022-07-31 DIAGNOSIS — J4489 Other specified chronic obstructive pulmonary disease: Secondary | ICD-10-CM | POA: Diagnosis not present

## 2022-07-31 LAB — NITRIC OXIDE: Nitric Oxide: 10

## 2022-07-31 MED ORDER — TRELEGY ELLIPTA 100-62.5-25 MCG/ACT IN AEPB: 1.0000 | INHALATION_SPRAY | Freq: Every day | RESPIRATORY_TRACT | 0 refills | Status: DC

## 2022-07-31 MED ORDER — TRELEGY ELLIPTA 100-62.5-25 MCG/ACT IN AEPB: 1.0000 | INHALATION_SPRAY | Freq: Every day | RESPIRATORY_TRACT | 4 refills | Status: DC

## 2022-07-31 NOTE — Progress Notes (Signed)
Subjective:    Patient ID: James Peck, male    DOB: 03-04-1941, 82 y.o.   MRN: [id number] Patient Care Team: Einar Pheasant, MD as PCP - General (Internal Medicine) Tyler Pita, MD as Consulting Physician (Pulmonary Disease)  Chief Complaint  Patient presents with   Follow-up    Covid 2 weeks ago. SOB with exertion. No wheezing. Dry cough.    HPI This is an 82 year old former smoker who follows up on the issue of cough and shortness of breath. He has known asthma COPD overlap syndrome and bronchiectasis. His last scheduled visit here was 31 January 2022 and at that time was found to be well compensated on his regimen of Trelegy Ellipta and as needed albuterol. This is a scheduled follow-up visit.  Since his prior visit he has had COVID-19  2 weeks ago.  He did well during his illness without requiring any major interventions or hospitalization.  He is still actively working. Endorses having dyspnea only with heavy exertion like carrying heavy objects but for the most part activities of daily living do not trigger dyspnea this may be a little bit more pronounced since his COVID but is getting better. He has not had any fevers, chills or sweats. No cough or sputum production. Sputum production has decreased significantly since he started Trelegy and continues to do well with this.  Rare use of albuterol as needed (twice per month at most).  No hemoptysis. He does not endorse any other new symptoms. Overall he feels well and looks well.  He has yet to have PFTs ordered in July.  DATA 04/18/2020 PFTs: FEV1 1.40 L or 57% predicted, FVC 2.48 L or 71% predicted, FEV1/FVC 57%.  There is significant bronchodilator response with 17% change in FEV1 postbronchodilator.  Moderate air trapping, mild diffusion capacity impairment, consistent with COPD/emphysema with reversible airways component. 09/12/2020 2D echo: LVEF 60 to 65%, no regional wall motion normalities.  Grade I DD. 09/12/2020 left heart  cath:Insignificant coronary artery disease with patent stent proximal/mid RCA, 50% stenosis mid/distal LAD with muscle bridge ([place]). 12/27/2020 CT chest: Emphysema, lower lobe posterior bronchiectasis, not new volume of retained secretions in bronchus.  No lesions of concern.  Calcified atherosclerosis.  Review of Systems A 10 point review of systems was performed and it is as noted above otherwise negative.   Patient Active Problem List   Diagnosis Date Noted   Asthma-COPD overlap syndrome 01/31/2022   Bronchiectasis without complication ([place]) 89/21/1940   Hematuria 10/01/2021   Influenza 08/06/2021   History of colon polyps 08/06/2021   COVID-19 virus infection 07/06/2021   Hyponatremia 04/09/2021   Subclavian artery stenosis, left ([place]) 02/04/2021   Carotid artery disease ([place]) 02/04/2021   Coronary artery disease    Chest pain    NSTEMI (non-ST elevated myocardial infarction) ([place])    COPD exacerbation ([place])    Exposure to confirmed case of COVID-19 07/26/2020   Allergic rhinitis 04/12/2020   Emphysema, unspecified ([place]) 04/12/2020   Former smoker 04/12/2020   Lung nodules 03/27/2020   Renal lesion 03/27/2020   Abnormal abdominal CT scan 02/16/2020   Left carotid bruit 11/10/2019   Right rotator cuff tear 08/19/2019   AC (acromioclavicular) arthritis 07/08/2019   Cough 06/30/2019   Healthcare maintenance 08/28/2018   Leg cramps 04/27/2018   Hyperglycemia 12/22/2017   Degenerative arthritis of left knee 06/04/2017   Degenerative tear of meniscus of left knee 02/14/2015   Acid reflux 01/27/2015   Adenomatous colon polyp 01/27/2015  Allergic state 01/27/2015   Arteriosclerosis of coronary artery 01/27/2015   Lumbar radiculopathy 01/27/2015   CAD in native artery 01/07/2014   Benign essential HTN 10/21/2013   HLD (hyperlipidemia) 10/21/2013   Social History   Tobacco Use   Smoking status: Former    Packs/day: 1.00    Years: 40.00    Total pack years: 40.00     Types: Cigarettes    Quit date: 2000    Years since quitting: 24.0   Smokeless tobacco: Never  Substance Use Topics   Alcohol use: No   Allergies  Allergen Reactions   Ace Inhibitors Cough   Enalapril Cough   Current Meds  Medication Sig   albuterol (VENTOLIN HFA) 108 (90 Base) MCG/ACT inhaler Inhale 1 puff into the lungs every 6 (six) hours as needed for wheezing or shortness of breath.   aspirin EC 81 MG tablet Take 81 mg by mouth daily.    calcium carbonate (OS-CAL - DOSED IN MG OF ELEMENTAL CALCIUM) 1250 (500 CA) MG tablet Take 1 tablet by mouth daily.    cetirizine (ZYRTEC) 10 MG tablet TAKE 1 TABLET BY MOUTH DAILY   chlorpheniramine (CHLOR-TRIMETON) 4 MG tablet Take 4 mg by mouth 2 (two) times daily as needed for allergies.   CINNAMON PO Take 1,200 mg by mouth 2 times daily at 12 noon and 4 pm.   dextromethorphan-guaiFENesin (MUCINEX DM) 30-600 MG 12hr tablet Take 1 tablet by mouth 2 (two) times daily.   famotidine (PEPCID) 20 MG tablet TAKE 1 TABLET BY MOUTH DAILY   finasteride (PROSCAR) 5 MG tablet TAKE 1 TABLET BY MOUTH DAILY.   fluticasone (FLONASE) 50 MCG/ACT nasal spray TAKE 2 SPRAYS INTO BOTH NOSTRILS DAILY   Fluticasone-Umeclidin-Vilant (TRELEGY ELLIPTA) 100-62.5-25 MCG/ACT AEPB Inhale 1 puff into the lungs daily.   Fluticasone-Umeclidin-Vilant (TRELEGY ELLIPTA) 100-62.5-25 MCG/ACT AEPB Inhale 1 puff into the lungs daily.   GLUCOSAMINE-CHONDROITIN PO Take 1 capsule by mouth daily.   Misc Natural Products (BLACK CHERRY CONCENTRATE PO) Take 1,000 mg daily by mouth.   nitroGLYCERIN (NITROSTAT) 0.4 MG SL tablet Place 1 tablet (0.4 mg total) under the tongue every 5 (five) minutes as needed for chest pain.   Omega-3 Fatty Acids (FISH OIL) 1000 MG CAPS Take 1,000 mg by mouth daily.    rosuvastatin (CRESTOR) 20 MG tablet Take 1 tablet (20 mg total) by mouth daily.   sildenafil (REVATIO) 20 MG tablet TAKE 1 TO 2 TABLETS BY MOUTH EVERY DAY AS NEEDED   telmisartan (MICARDIS)  40 MG tablet Take 1 tablet (40 mg total) by mouth daily.   Immunization History  Administered Date(s) Administered   Fluad Quad(high Dose 65+) 04/09/2019, 04/04/2021   Influenza Split 05/11/2014   Influenza, High Dose Seasonal PF 04/23/2017, 04/29/2018, 04/09/2019, 05/05/2020, 05/10/2022   Influenza-Unspecified 04/25/2015, 04/23/2017   PFIZER(Purple Top)SARS-COV-2 Vaccination 08/28/2019, 09/18/2019   Tdap 03/11/2013    .    Objective:   Physical Exam BP 118/70 (BP Location: Left Arm, Cuff Size: Normal)   Pulse 72   Temp (!) 97.1 F (36.2 C)   Ht '5\' 6"'$  (1.676 m)   Wt 168 lb 3.2 oz (76.3 kg)   SpO2 96%   BMI 27.15 kg/m   SpO2: 96 % O2 Device: None (Room air)  GENERAL: Well-developed, well-nourished elderly gentleman, looks younger than stated age, in no acute distress, fully ambulatory.  No conversational dyspnea.  Prior. HEAD: Normocephalic, atraumatic.  EYES: Pupils equal, round, reactive to light.  No scleral icterus.  MOUTH:  Oral mucosa moist, intact dentition. NECK: Supple. No thyromegaly. Trachea midline. No JVD.  No adenopathy. PULMONARY: Good air entry bilaterally.  No adventitious sounds. CARDIOVASCULAR: S1 and S2. Regular rate and rhythm.  No rubs, murmurs or gallops heard. ABDOMEN: Benign. MUSCULOSKELETAL: No joint deformity, no clubbing, no edema.  NEUROLOGIC: No focal deficit, no gait disturbance, speech is fluent. SKIN: Intact,warm,dry. PSYCH: Mood and behavior normal.    Lab Results  Component Value Date   NITRICOXIDE 10 07/31/2022      Assessment & Plan:     ICD-10-CM   1. Asthma-COPD overlap syndrome  J44.89 Nitric oxide   Continue Trelegy Ellipta Continue as needed albuterol No evidence of type II inflammation    2. Bronchiectasis without complication ([place])  B26.2    Well compensated on current regimen    3. Former smoker  Z87.891    No evidence of relapse     Patient appears to be well compensated.  Will see the patient in follow-up in 3  months time he is to contact us prior to that time should any new problems arise.  Appropriate refills were sent to his pharmacy of choice today.   Renold Don, MD Advanced Bronchoscopy PCCM [facility]    *This note was dictated using voice recognition software/Dragon.  Despite best efforts to proofread, errors can occur which can change the meaning. Any transcriptional errors that result from this process are unintentional and may not be fully corrected at the time of dictation.

## 2022-07-31 NOTE — Patient Instructions (Signed)
We have refilled your Trelegy.  Your lungs sounded clear today.  Will see you in follow-up in 3 months time call sooner should any problems arise.

## 2022-08-03 ENCOUNTER — Encounter: Payer: Self-pay | Admitting: Pulmonary Disease

## 2022-08-09 ENCOUNTER — Other Ambulatory Visit (INDEPENDENT_AMBULATORY_CARE_PROVIDER_SITE_OTHER): Payer: PPO

## 2022-08-09 DIAGNOSIS — E785 Hyperlipidemia, unspecified: Secondary | ICD-10-CM | POA: Diagnosis not present

## 2022-08-09 DIAGNOSIS — R739 Hyperglycemia, unspecified: Secondary | ICD-10-CM | POA: Diagnosis not present

## 2022-08-09 LAB — BASIC METABOLIC PANEL
BUN: 14 mg/dL (ref 6–23)
CO2: 26 mEq/L (ref 19–32)
Calcium: 9.1 mg/dL (ref 8.4–10.5)
Chloride: 101 mEq/L (ref 96–112)
Creatinine, Ser: 0.95 mg/dL (ref 0.40–1.50)
GFR: 75.38 mL/min (ref >60.00)
Glucose, Bld: 115 mg/dL — ABNORMAL HIGH (ref 70–99)
Potassium: 4.5 mEq/L (ref 3.5–5.1)
Sodium: 134 mEq/L — ABNORMAL LOW (ref 135–145)

## 2022-08-09 LAB — HEMOGLOBIN A1C: Hgb A1c MFr Bld: 6.7 % — ABNORMAL HIGH (ref 4.6–6.5)

## 2022-08-09 LAB — CBC WITH DIFFERENTIAL/PLATELET
Basophils Absolute: 0 10*3/uL (ref 0.0–0.1)
Basophils Relative: 0.6 % (ref 0.0–3.0)
Eosinophils Absolute: 0.3 10*3/uL (ref 0.0–0.7)
Eosinophils Relative: 4.8 % (ref 0.0–5.0)
HCT: 42.7 % (ref 39.0–52.0)
Hemoglobin: 14.4 g/dL (ref 13.0–17.0)
Lymphocytes Relative: 29.1 % (ref 12.0–46.0)
Lymphs Abs: 1.7 10*3/uL (ref 0.7–4.0)
MCHC: 33.8 g/dL (ref 30.0–36.0)
MCV: 92.2 fl (ref 78.0–100.0)
Monocytes Absolute: 0.7 10*3/uL (ref 0.1–1.0)
Monocytes Relative: 11.8 % (ref 3.0–12.0)
Neutro Abs: 3.2 10*3/uL (ref 1.4–7.7)
Neutrophils Relative %: 53.7 % (ref 43.0–77.0)
Platelets: 280 10*3/uL (ref 150.0–400.0)
RBC: 4.63 Mil/uL (ref 4.22–5.81)
RDW: 14.1 % (ref 11.5–15.5)
WBC: 6 10*3/uL (ref 4.0–10.5)

## 2022-08-09 LAB — HEPATIC FUNCTION PANEL
ALT: 34 U/L (ref 0–53)
AST: 22 U/L (ref 0–37)
Albumin: 4.2 g/dL (ref 3.5–5.2)
Alkaline Phosphatase: 49 U/L (ref 39–117)
Bilirubin, Direct: 0.1 mg/dL (ref 0.0–0.3)
Total Bilirubin: 0.6 mg/dL (ref 0.2–1.2)
Total Protein: 6.2 g/dL (ref 6.0–8.3)

## 2022-08-09 LAB — LIPID PANEL
Cholesterol: 159 mg/dL (ref 0–200)
HDL: 54.6 mg/dL (ref >39.00)
LDL Cholesterol: 89 mg/dL (ref 0–99)
NonHDL: 104.07
Total CHOL/HDL Ratio: 3
Triglycerides: 77 mg/dL (ref 0.0–149.0)
VLDL: 15.4 mg/dL (ref 0.0–40.0)

## 2022-09-03 ENCOUNTER — Other Ambulatory Visit: Payer: Self-pay | Admitting: Internal Medicine

## 2022-09-04 DIAGNOSIS — M9904 Segmental and somatic dysfunction of sacral region: Secondary | ICD-10-CM | POA: Diagnosis not present

## 2022-09-04 DIAGNOSIS — M5451 Vertebrogenic low back pain: Secondary | ICD-10-CM | POA: Diagnosis not present

## 2022-09-04 DIAGNOSIS — M461 Sacroiliitis, not elsewhere classified: Secondary | ICD-10-CM | POA: Diagnosis not present

## 2022-09-04 DIAGNOSIS — M5441 Lumbago with sciatica, right side: Secondary | ICD-10-CM | POA: Diagnosis not present

## 2022-09-04 DIAGNOSIS — M9903 Segmental and somatic dysfunction of lumbar region: Secondary | ICD-10-CM | POA: Diagnosis not present

## 2022-09-04 DIAGNOSIS — M5442 Lumbago with sciatica, left side: Secondary | ICD-10-CM | POA: Diagnosis not present

## 2022-09-07 DIAGNOSIS — M9904 Segmental and somatic dysfunction of sacral region: Secondary | ICD-10-CM | POA: Diagnosis not present

## 2022-09-07 DIAGNOSIS — M5441 Lumbago with sciatica, right side: Secondary | ICD-10-CM | POA: Diagnosis not present

## 2022-09-07 DIAGNOSIS — M5442 Lumbago with sciatica, left side: Secondary | ICD-10-CM | POA: Diagnosis not present

## 2022-09-07 DIAGNOSIS — M5451 Vertebrogenic low back pain: Secondary | ICD-10-CM | POA: Diagnosis not present

## 2022-09-07 DIAGNOSIS — M461 Sacroiliitis, not elsewhere classified: Secondary | ICD-10-CM | POA: Diagnosis not present

## 2022-09-07 DIAGNOSIS — M9903 Segmental and somatic dysfunction of lumbar region: Secondary | ICD-10-CM | POA: Diagnosis not present

## 2022-09-13 DIAGNOSIS — M5442 Lumbago with sciatica, left side: Secondary | ICD-10-CM | POA: Diagnosis not present

## 2022-09-13 DIAGNOSIS — M461 Sacroiliitis, not elsewhere classified: Secondary | ICD-10-CM | POA: Diagnosis not present

## 2022-09-13 DIAGNOSIS — M9904 Segmental and somatic dysfunction of sacral region: Secondary | ICD-10-CM | POA: Diagnosis not present

## 2022-09-13 DIAGNOSIS — M5451 Vertebrogenic low back pain: Secondary | ICD-10-CM | POA: Diagnosis not present

## 2022-09-13 DIAGNOSIS — M9903 Segmental and somatic dysfunction of lumbar region: Secondary | ICD-10-CM | POA: Diagnosis not present

## 2022-09-13 DIAGNOSIS — M5441 Lumbago with sciatica, right side: Secondary | ICD-10-CM | POA: Diagnosis not present

## 2022-09-17 ENCOUNTER — Other Ambulatory Visit: Payer: Self-pay | Admitting: Internal Medicine

## 2022-09-18 DIAGNOSIS — M9903 Segmental and somatic dysfunction of lumbar region: Secondary | ICD-10-CM | POA: Diagnosis not present

## 2022-09-18 DIAGNOSIS — M5451 Vertebrogenic low back pain: Secondary | ICD-10-CM | POA: Diagnosis not present

## 2022-09-18 DIAGNOSIS — M5441 Lumbago with sciatica, right side: Secondary | ICD-10-CM | POA: Diagnosis not present

## 2022-09-18 DIAGNOSIS — M9904 Segmental and somatic dysfunction of sacral region: Secondary | ICD-10-CM | POA: Diagnosis not present

## 2022-09-18 DIAGNOSIS — M5442 Lumbago with sciatica, left side: Secondary | ICD-10-CM | POA: Diagnosis not present

## 2022-09-18 DIAGNOSIS — M461 Sacroiliitis, not elsewhere classified: Secondary | ICD-10-CM | POA: Diagnosis not present

## 2022-10-08 ENCOUNTER — Other Ambulatory Visit: Payer: Self-pay | Admitting: *Deleted

## 2022-10-08 MED ORDER — FINASTERIDE 5 MG PO TABS: 5.0000 mg | ORAL_TABLET | Freq: Every day | ORAL | 3 refills | Status: DC

## 2022-10-09 DIAGNOSIS — M9904 Segmental and somatic dysfunction of sacral region: Secondary | ICD-10-CM | POA: Diagnosis not present

## 2022-10-09 DIAGNOSIS — M9903 Segmental and somatic dysfunction of lumbar region: Secondary | ICD-10-CM | POA: Diagnosis not present

## 2022-10-09 DIAGNOSIS — M5441 Lumbago with sciatica, right side: Secondary | ICD-10-CM | POA: Diagnosis not present

## 2022-10-09 DIAGNOSIS — M461 Sacroiliitis, not elsewhere classified: Secondary | ICD-10-CM | POA: Diagnosis not present

## 2022-10-09 DIAGNOSIS — M5442 Lumbago with sciatica, left side: Secondary | ICD-10-CM | POA: Diagnosis not present

## 2022-10-09 DIAGNOSIS — M5451 Vertebrogenic low back pain: Secondary | ICD-10-CM | POA: Diagnosis not present

## 2022-10-16 DIAGNOSIS — M461 Sacroiliitis, not elsewhere classified: Secondary | ICD-10-CM | POA: Diagnosis not present

## 2022-10-16 DIAGNOSIS — M5441 Lumbago with sciatica, right side: Secondary | ICD-10-CM | POA: Diagnosis not present

## 2022-10-16 DIAGNOSIS — M9904 Segmental and somatic dysfunction of sacral region: Secondary | ICD-10-CM | POA: Diagnosis not present

## 2022-10-16 DIAGNOSIS — M9903 Segmental and somatic dysfunction of lumbar region: Secondary | ICD-10-CM | POA: Diagnosis not present

## 2022-10-16 DIAGNOSIS — M5451 Vertebrogenic low back pain: Secondary | ICD-10-CM | POA: Diagnosis not present

## 2022-10-16 DIAGNOSIS — M5442 Lumbago with sciatica, left side: Secondary | ICD-10-CM | POA: Diagnosis not present

## 2022-10-17 ENCOUNTER — Ambulatory Visit (INDEPENDENT_AMBULATORY_CARE_PROVIDER_SITE_OTHER): Payer: PPO | Admitting: Internal Medicine

## 2022-10-17 ENCOUNTER — Encounter: Payer: Self-pay | Admitting: Internal Medicine

## 2022-10-17 VITALS — BP 120/68 | HR 69 | Temp 98.3°F | Resp 16 | Ht 67.0 in | Wt 166.8 lb

## 2022-10-17 DIAGNOSIS — K219 Gastro-esophageal reflux disease without esophagitis: Secondary | ICD-10-CM | POA: Diagnosis not present

## 2022-10-17 DIAGNOSIS — I10 Essential (primary) hypertension: Secondary | ICD-10-CM | POA: Diagnosis not present

## 2022-10-17 DIAGNOSIS — J479 Bronchiectasis, uncomplicated: Secondary | ICD-10-CM | POA: Diagnosis not present

## 2022-10-17 DIAGNOSIS — R319 Hematuria, unspecified: Secondary | ICD-10-CM | POA: Diagnosis not present

## 2022-10-17 DIAGNOSIS — E785 Hyperlipidemia, unspecified: Secondary | ICD-10-CM

## 2022-10-17 DIAGNOSIS — J4489 Other specified chronic obstructive pulmonary disease: Secondary | ICD-10-CM | POA: Diagnosis not present

## 2022-10-17 DIAGNOSIS — R739 Hyperglycemia, unspecified: Secondary | ICD-10-CM

## 2022-10-17 DIAGNOSIS — Z86010 Personal history of colonic polyps: Secondary | ICD-10-CM

## 2022-10-17 DIAGNOSIS — I771 Stricture of artery: Secondary | ICD-10-CM

## 2022-10-17 DIAGNOSIS — I2510 Atherosclerotic heart disease of native coronary artery without angina pectoris: Secondary | ICD-10-CM | POA: Diagnosis not present

## 2022-10-17 DIAGNOSIS — I779 Disorder of arteries and arterioles, unspecified: Secondary | ICD-10-CM | POA: Diagnosis not present

## 2022-10-17 MED ORDER — FLUTICASONE PROPIONATE 50 MCG/ACT NA SUSP: NASAL | 3 refills | Status: DC

## 2022-10-17 NOTE — Progress Notes (Signed)
Subjective:    Patient ID: James Peck, male    DOB: 1941/06/21, 82 y.o.   MRN: [id number]  Patient here for  Chief Complaint  Patient presents with   Medical Management of Chronic Issues    HPI Here to follow up regarding asthma/COPD, bronchiectasis - on trelegy, hypertension, GERD and hypercholesterolemia.  On micardis 40mg  q day. Reports he is doing relatively well.  No chest pain reported.  Breathing stable.  No abdominal pain or bowel change reported.  Trelegy - breathing stable - f/u next week with pulmonary.    Past Medical History:  Diagnosis Date   Allergy    Arthritis    Coronary artery disease    GERD (gastroesophageal reflux disease)    History of hiatal hernia    Hypercholesteremia    Hypertension    Past Surgical History:  Procedure Laterality Date   CARDIAC CATHETERIZATION     cardiac stents     CATARACT EXTRACTION W/PHACO Right 09/10/2017   Procedure: CATARACT EXTRACTION PHACO AND INTRAOCULAR LENS PLACEMENT ([place]);  Surgeon: Birder Robson, MD;  Location: [facility] ORS;  Service: Ophthalmology;  Laterality: Right;  [place] 01:01.7 AP% 15.3 CDE 9.38 Fluid pack lot # [id number]   CATARACT EXTRACTION W/PHACO Left 10/02/2017   Procedure: CATARACT EXTRACTION PHACO AND INTRAOCULAR LENS PLACEMENT ([facility]);  Surgeon: Birder Robson, MD;  Location: [facility] ORS;  Service: Ophthalmology;  Laterality: Left;  [place] 00:42.3 AP% 16.2 CDE 6.85 Fluid Pack Lot # [id number]   COLONOSCOPY WITH PROPOFOL N/A 06/28/2017   Procedure: COLONOSCOPY WITH PROPOFOL;  Surgeon: Manya Silvas, MD;  Location: [facility] ENDOSCOPY;  Service: Endoscopy;  Laterality: N/A;   COLONOSCOPY WITH PROPOFOL N/A 12/12/2021   Procedure: COLONOSCOPY WITH PROPOFOL;  Surgeon: Lesly Rubenstein, MD;  Location: [facility] ENDOSCOPY;  Service: Endoscopy;  Laterality: N/A;   CORONARY ANGIOPLASTY     STENTS X 2   EYE SURGERY     KNEE ARTHROSCOPY     LEFT HEART CATH AND CORONARY ANGIOGRAPHY N/A 09/12/2020   Procedure: LEFT HEART  CATH AND CORONARY ANGIOGRAPHY;  Surgeon: Isaias Cowman, MD;  Location: [place] [facility] LAB;  Service: Cardiovascular;  Laterality: N/A;   Family History  Problem Relation Age of Onset   Heart disease Mother    Diabetes Mother    Heart disease Father    Diabetes Father    Alzheimer's disease Brother    Stroke Brother    Social History   Socioeconomic History   Marital status: Married    Spouse name: Not on file   Number of children: Not on file   Years of education: Not on file   Highest education level: Not on file  Occupational History   Not on file  Tobacco Use   Smoking status: Former    Packs/day: 1.00    Years: 40.00    Additional pack years: 0.00    Total pack years: 40.00    Types: Cigarettes    Quit date: 2000    Years since quitting: 24.2   Smokeless tobacco: Never  Vaping Use   Vaping Use: Never used  Substance and Sexual Activity   Alcohol use: No   Drug use: No   Sexual activity: Not on file  Other Topics Concern   Not on file  Social History Narrative   Not on file   Social Determinants of Health   Financial Resource Strain: Low Risk  (07/16/2022)   Overall Financial Resource Strain (CARDIA)    Difficulty of Paying Living  Expenses: Not hard at all  Food Insecurity: No Food Insecurity (07/16/2022)   Hunger Vital Sign    Worried About Running Out of Food in the Last Year: Never true    Ran Out of Food in the Last Year: Never true  Transportation Needs: No Transportation Needs (07/16/2022)   PRAPARE - Hydrologist (Medical): No    Lack of Transportation (Non-Medical): No  Physical Activity: Sufficiently Active (07/16/2022)   Exercise Vital Sign    Days of Exercise per Week: 5 days    Minutes of Exercise per Session: 30 min  Stress: No Stress Concern Present (07/16/2022)   [place]    Feeling of Stress : Not at all  Social Connections: Unknown  (07/16/2022)   Social Connection and Isolation Panel [NHANES]    Frequency of Communication with Friends and Family: Not on file    Frequency of Social Gatherings with Friends and Family: Not on file    Attends Religious Services: Not on file    Active Member of Clubs or Organizations: Not on file    Attends Archivist Meetings: Not on file    Marital Status: Married     Review of Systems  Constitutional:  Negative for appetite change and unexpected weight change.  HENT:  Negative for congestion and sinus pressure.   Respiratory:  Negative for cough and chest tightness.        Breathing stable.   Cardiovascular:  Negative for chest pain and palpitations.  Gastrointestinal:  Negative for abdominal pain, diarrhea, nausea and vomiting.  Genitourinary:  Negative for difficulty urinating and dysuria.  Musculoskeletal:  Negative for joint swelling and myalgias.  Skin:  Negative for color change and rash.  Neurological:  Negative for dizziness and headaches.  Psychiatric/Behavioral:  Negative for agitation and dysphoric mood.        Objective:     BP 120/68   Pulse 69   Temp 98.3 F (36.8 C)   Resp 16   Ht 5\' 7"  (1.702 m)   Wt 166 lb 12.8 oz (75.7 kg)   SpO2 97%   BMI 26.12 kg/m  Wt Readings from Last 3 Encounters:  10/25/22 164 lb 12.8 oz (74.8 kg)  10/17/22 166 lb 12.8 oz (75.7 kg)  07/31/22 168 lb 3.2 oz (76.3 kg)    Physical Exam Vitals reviewed.  Constitutional:      General: He is not in acute distress.    Appearance: Normal appearance. He is well-developed.  HENT:     Head: Normocephalic and atraumatic.     Right Ear: External ear normal.     Left Ear: External ear normal.  Eyes:     General: No scleral icterus.       Right eye: No discharge.        Left eye: No discharge.     Conjunctiva/sclera: Conjunctivae normal.  Cardiovascular:     Rate and Rhythm: Normal rate and regular rhythm.  Pulmonary:     Effort: Pulmonary effort is normal. No  respiratory distress.     Breath sounds: Normal breath sounds.  Abdominal:     General: Bowel sounds are normal.     Palpations: Abdomen is soft.     Tenderness: There is no abdominal tenderness.  Musculoskeletal:        General: No swelling or tenderness.     Cervical back: Neck supple. No tenderness.  Lymphadenopathy:  Cervical: No cervical adenopathy.  Skin:    Findings: No erythema or rash.  Neurological:     Mental Status: He is alert.  Psychiatric:        Mood and Affect: Mood normal.        Behavior: Behavior normal.      Outpatient Encounter Medications as of 10/17/2022  Medication Sig   albuterol (VENTOLIN HFA) 108 (90 Base) MCG/ACT inhaler Inhale 1 puff into the lungs every 6 (six) hours as needed for wheezing or shortness of breath.   aspirin EC 81 MG tablet Take 81 mg by mouth daily.    calcium carbonate (OS-CAL - DOSED IN MG OF ELEMENTAL CALCIUM) 1250 (500 CA) MG tablet Take 1 tablet by mouth daily.    cetirizine (ZYRTEC) 10 MG tablet TAKE 1 TABLET BY MOUTH DAILY   chlorpheniramine (CHLOR-TRIMETON) 4 MG tablet Take 4 mg by mouth 2 (two) times daily as needed for allergies.   CINNAMON PO Take 1,200 mg by mouth 2 times daily at 12 noon and 4 pm.   dextromethorphan-guaiFENesin (MUCINEX DM) 30-600 MG 12hr tablet Take 1 tablet by mouth 2 (two) times daily.   famotidine (PEPCID) 20 MG tablet TAKE 1 TABLET BY MOUTH DAILY   finasteride (PROSCAR) 5 MG tablet Take 1 tablet (5 mg total) by mouth daily.   Fluticasone-Umeclidin-Vilant (TRELEGY ELLIPTA) 100-62.5-25 MCG/ACT AEPB Inhale 1 puff into the lungs daily.   GLUCOSAMINE-CHONDROITIN PO Take 1 capsule by mouth daily.   Misc Natural Products (BLACK CHERRY CONCENTRATE PO) Take 1,000 mg daily by mouth.   nitroGLYCERIN (NITROSTAT) 0.4 MG SL tablet Place 1 tablet (0.4 mg total) under the tongue every 5 (five) minutes as needed for chest pain.   Omega-3 Fatty Acids (FISH OIL) 1000 MG CAPS Take 1,000 mg by mouth daily.     rosuvastatin (CRESTOR) 20 MG tablet Take 1 tablet (20 mg total) by mouth daily.   sildenafil (REVATIO) 20 MG tablet TAKE 1 TO 2 TABLETS BY MOUTH EVERY DAY AS NEEDED   telmisartan (MICARDIS) 40 MG tablet Take 1 tablet (40 mg total) by mouth daily.   [DISCONTINUED] fluticasone (FLONASE) 50 MCG/ACT nasal spray TAKE 2 SPRAYS INTO BOTH NOSTRILS DAILY   fluticasone (FLONASE) 50 MCG/ACT nasal spray TAKE 2 SPRAYS INTO BOTH NOSTRILS DAILY   [DISCONTINUED] Fluticasone-Umeclidin-Vilant (TRELEGY ELLIPTA) 100-62.5-25 MCG/ACT AEPB Inhale 1 puff into the lungs daily.   [DISCONTINUED] Fluticasone-Umeclidin-Vilant (TRELEGY ELLIPTA) 100-62.5-25 MCG/ACT AEPB Inhale 1 puff into the lungs daily.   [DISCONTINUED] predniSONE (DELTASONE) 10 MG tablet Take 6 tablets x 1 day and then decrease by 1/2 tablet per day until down to zero mg. (Patient not taking: Reported on 07/31/2022)   No facility-administered encounter medications on file as of 10/17/2022.     Lab Results  Component Value Date   WBC 6.0 08/09/2022   HGB 14.4 08/09/2022   HCT 42.7 08/09/2022   PLT 280.0 08/09/2022   GLUCOSE 115 (H) 08/09/2022   CHOL 159 08/09/2022   TRIG 77.0 08/09/2022   HDL 54.60 08/09/2022   LDLCALC 89 08/09/2022   ALT 34 08/09/2022   AST 22 08/09/2022   NA 134 (L) 08/09/2022   K 4.5 08/09/2022   CL 101 08/09/2022   CREATININE 0.95 08/09/2022   BUN 14 08/09/2022   CO2 26 08/09/2022   TSH 1.79 12/22/2021   PSA 1.27 11/28/2020   INR 1.0 09/11/2020   HGBA1C 6.7 (H) 08/09/2022    DG Chest 2 View  Result Date: 03/22/2022 CLINICAL DATA:  Persistent cough and congestion EXAM: CHEST - 2 VIEW COMPARISON:  Chest x-ray dated 09/11/2020 FINDINGS: Heart size and mediastinal contours are within normal limits. Lungs are hyperexpanded. Chronic bronchitic changes noted centrally. No pulmonary nodule or mass is seen. No confluent airspace opacity to suggest a developing pneumonia. No pleural effusion or pneumothorax is seen. No  acute-appearing osseous abnormality. IMPRESSION: 1. No active cardiopulmonary disease. No evidence of pneumonia or pulmonary edema. 2. Hyperexpanded lungs indicating COPD. Electronically Signed   By: Franki Cabot M.D.   On: 03/22/2022 08:31       Assessment & Plan:  Benign essential HTN Assessment & Plan: Continue micardis 40mg  q day.  Follow pressures.  Follow metabolic panel.   Orders: -     Basic metabolic panel; Future -     TSH; Future  Hyperglycemia Assessment & Plan: Low carb diet and exercise. Follow met b and a1c.   Lab Results  Component Value Date   HGBA1C 6.7 (H) 08/09/2022    Orders: -     Hemoglobin A1c; Future  Hyperlipidemia, unspecified hyperlipidemia type Assessment & Plan: On crestor.  Low cholesterol diet and exercise.  Follow lipid panel and liver function tests   Orders: -     Lipid panel; Future -     Hepatic function panel; Future  Coronary artery disease involving native coronary artery of native heart without angina pectoris -     Ambulatory referral to Cardiology  Gastroesophageal reflux disease without esophagitis Assessment & Plan: Controlled on prilosec.     Arteriosclerosis of coronary artery Assessment & Plan: Has known CAD.  Has previously been followed by Dr Ubaldo Glassing.  Continue risk factor modification.  Currently without acute symptoms. Request to establish care with Dr Rockey Situ    Asthma-COPD overlap syndrome Assessment & Plan: Saw pulmonary 01/2022.  Recommended f/u 6 months with PFTs prior.  Due to follow up 09/2022 - dr Patsey Berthold - continue trelegy and prn albuterol.    Bronchiectasis without complication [facility]) Assessment & Plan: Established with pulmonary.  Continue trelegy.   Breathing overall improved on current regimen.    CAD in native artery Assessment & Plan: Previously admitted with NSTEMI 08/2020.  09/12/20 - cath (50% mid/distal LAD).  No chest pain now.  Has been followed by Dr Ubaldo Glassing.  Continue micardis. Continue crestor.  Request to establish care with Dr Rockey Situ.     Bilateral carotid artery disease, unspecified type ([place]) Assessment & Plan: Carotid ultrasound <40%.  Saw [facility].  Recommended f/u in 12 months.    Hematuria, unspecified type Assessment & Plan: Evaluated by Dr Bernardo Heater - 09/2021 - recommended CT urogram and cystoscopy.  Cysto (10/30/21) - No bladder mucosal abnormalities. Hematuria most likely secondary to BPH. Started finasteride 5 mg daily. Follow-up 1 year or earlier for recurrent hematuria   History of colon polyps Assessment & Plan: Colonoscopy 12/12/21:   RECTAL POLYP; HOT SNARE:  - TUBULAR ADENOMA WITH MUCOSAL PROLAPSE TYPE CHANGES.  - CAUTERIZED POLYP BASE FREE OF DYSPLASIA.  - NEGATIVE FOR HIGH-GRADE DYSPLASIA AND MALIGNANCY.   COLON POLYPS X2, ASCENDING; COLD BIOPSY AND COLD SNARE:  - MULTIPLE FRAGMENTS OF TUBULAR ADENOMAS.  - NEGATIVE FOR HIGH-GRADE DYSPLASIA AND MALIGNANCY.   Subclavian artery stenosis, left ([facility]) Assessment & Plan: No arm pain.  Continue antiplatelet therapy.  Saw [facility] 01/2021.  Recommended f/u in one year. Per review, appears to have had f/u  01/2022.    Other orders -     Fluticasone Propionate; TAKE 2 SPRAYS INTO BOTH  NOSTRILS DAILY  Dispense: 48 g; Refill: 3     Einar Pheasant, MD

## 2022-10-23 DIAGNOSIS — M5441 Lumbago with sciatica, right side: Secondary | ICD-10-CM | POA: Diagnosis not present

## 2022-10-23 DIAGNOSIS — M461 Sacroiliitis, not elsewhere classified: Secondary | ICD-10-CM | POA: Diagnosis not present

## 2022-10-23 DIAGNOSIS — M9904 Segmental and somatic dysfunction of sacral region: Secondary | ICD-10-CM | POA: Diagnosis not present

## 2022-10-23 DIAGNOSIS — M9903 Segmental and somatic dysfunction of lumbar region: Secondary | ICD-10-CM | POA: Diagnosis not present

## 2022-10-23 DIAGNOSIS — M5442 Lumbago with sciatica, left side: Secondary | ICD-10-CM | POA: Diagnosis not present

## 2022-10-23 DIAGNOSIS — M5451 Vertebrogenic low back pain: Secondary | ICD-10-CM | POA: Diagnosis not present

## 2022-10-25 ENCOUNTER — Encounter: Payer: Self-pay | Admitting: Pulmonary Disease

## 2022-10-25 ENCOUNTER — Ambulatory Visit: Payer: PPO | Admitting: Pulmonary Disease

## 2022-10-25 VITALS — BP 110/64 | HR 65 | Temp 97.1°F | Ht 67.0 in | Wt 164.8 lb

## 2022-10-25 DIAGNOSIS — Z87891 Personal history of nicotine dependence: Secondary | ICD-10-CM | POA: Diagnosis not present

## 2022-10-25 DIAGNOSIS — J4489 Other specified chronic obstructive pulmonary disease: Secondary | ICD-10-CM | POA: Diagnosis not present

## 2022-10-25 DIAGNOSIS — J479 Bronchiectasis, uncomplicated: Secondary | ICD-10-CM

## 2022-10-25 LAB — NITRIC OXIDE: Nitric Oxide: 13

## 2022-10-25 NOTE — Progress Notes (Signed)
Subjective:    Patient ID: James Peck, male    DOB: 1940/10/03, 82 y.o.   MRN: [id number] Patient Care Team: Einar Pheasant, MD as PCP - General (Internal Medicine) Tyler Pita, MD as Consulting Physician (Pulmonary Disease)  Chief Complaint  Patient presents with   Follow-up    SOB with exertion. No wheezing. Cough with yellow sputum in the morning.   HPI Jaiveer is an 82 year old former smoker who follows up on the issue of cough and shortness of breath. He has known asthma COPD overlap syndrome and bronchiectasis. His last scheduled visit here was 31 July 2022 and at that time was found to be well compensated on his regimen of Trelegy Ellipta and as needed albuterol.  He also had just recovered from COVID-19, a mild case.  This is a scheduled follow-up visit. He is still actively working. Endorses having dyspnea only with heavy exertion like carrying heavy objects but for the most part activities of daily living do not trigger dyspnea he feels that this is much better since he started Trelegy. He has not had any fevers, chills or sweats. No cough or sputum production. Sputum production has decreased significantly since he started Trelegy and continues to do well with this.  Rare use of albuterol as needed (twice per month at most).  No hemoptysis. He does not endorse any other new symptoms. Overall he feels well and looks well.   DATA 04/18/2020 PFTs: FEV1 1.40 L or 57% predicted, FVC 2.48 L or 71% predicted, FEV1/FVC 57%.  There is significant bronchodilator response with 17% change in FEV1 postbronchodilator.  Moderate air trapping, mild diffusion capacity impairment, consistent with COPD/emphysema with reversible airways component. 09/12/2020 2D echo: LVEF 60 to 65%, no regional wall motion normalities.  Grade I DD. 09/12/2020 left heart cath:Insignificant coronary artery disease with patent stent proximal/mid RCA, 50% stenosis mid/distal LAD with muscle bridge  ([place]). 12/27/2020 CT chest: Emphysema, lower lobe posterior bronchiectasis, not new volume of retained secretions in bronchus.  No lesions of concern.  Calcified atherosclerosis.  Review of Systems A 10 point review of systems was performed and it is as noted above otherwise negative.  Patient Active Problem List   Diagnosis Date Noted   Asthma-COPD overlap syndrome 01/31/2022   Bronchiectasis without complication ([place]) [id number]   Hematuria 10/01/2021   Influenza 08/06/2021   History of colon polyps 08/06/2021   COVID-19 virus infection 07/06/2021   Hyponatremia 04/09/2021   Subclavian artery stenosis, left ([place]) 02/04/2021   Carotid artery disease ([place]) 02/04/2021   Coronary artery disease    Chest pain    NSTEMI (non-ST elevated myocardial infarction) ([place])    COPD exacerbation ([place])    Exposure to confirmed case of COVID-19 07/26/2020   Allergic rhinitis 04/12/2020   Emphysema, unspecified ([place]) 04/12/2020   Former smoker 04/12/2020   Lung nodules 03/27/2020   Renal lesion 03/27/2020   Abnormal abdominal CT scan 02/16/2020   Left carotid bruit 11/10/2019   Right rotator cuff tear 08/19/2019   AC (acromioclavicular) arthritis 07/08/2019   Cough 06/30/2019   Healthcare maintenance 08/28/2018   Leg cramps 04/27/2018   Hyperglycemia 12/22/2017   Degenerative arthritis of left knee 06/04/2017   Degenerative tear of meniscus of left knee 02/14/2015   Acid reflux 01/27/2015   Adenomatous colon polyp 01/27/2015   Allergic state 01/27/2015   Arteriosclerosis of coronary artery 01/27/2015   Lumbar radiculopathy 01/27/2015   CAD in native artery 01/07/2014   Benign essential HTN 10/21/2013  HLD (hyperlipidemia) 10/21/2013   Social History   Tobacco Use   Smoking status: Former    Packs/day: 1.00    Years: 40.00    Additional pack years: 0.00    Total pack years: 40.00    Types: Cigarettes    Quit date: 2000    Years since quitting: 24.2   Smokeless tobacco:  Never  Substance Use Topics   Alcohol use: No   Allergies  Allergen Reactions   Ace Inhibitors Cough   Enalapril Cough   Current Meds  Medication Sig   albuterol (VENTOLIN HFA) 108 (90 Base) MCG/ACT inhaler Inhale 1 puff into the lungs every 6 (six) hours as needed for wheezing or shortness of breath.   aspirin EC 81 MG tablet Take 81 mg by mouth daily.    calcium carbonate (OS-CAL - DOSED IN MG OF ELEMENTAL CALCIUM) 1250 (500 CA) MG tablet Take 1 tablet by mouth daily.    cetirizine (ZYRTEC) 10 MG tablet TAKE 1 TABLET BY MOUTH DAILY   chlorpheniramine (CHLOR-TRIMETON) 4 MG tablet Take 4 mg by mouth 2 (two) times daily as needed for allergies.   CINNAMON PO Take 1,200 mg by mouth 2 times daily at 12 noon and 4 pm.   dextromethorphan-guaiFENesin (MUCINEX DM) 30-600 MG 12hr tablet Take 1 tablet by mouth 2 (two) times daily.   famotidine (PEPCID) 20 MG tablet TAKE 1 TABLET BY MOUTH DAILY   finasteride (PROSCAR) 5 MG tablet Take 1 tablet (5 mg total) by mouth daily.   fluticasone (FLONASE) 50 MCG/ACT nasal spray TAKE 2 SPRAYS INTO BOTH NOSTRILS DAILY   Fluticasone-Umeclidin-Vilant (TRELEGY ELLIPTA) 100-62.5-25 MCG/ACT AEPB Inhale 1 puff into the lungs daily.   GLUCOSAMINE-CHONDROITIN PO Take 1 capsule by mouth daily.   Misc Natural Products (BLACK CHERRY CONCENTRATE PO) Take 1,000 mg daily by mouth.   nitroGLYCERIN (NITROSTAT) 0.4 MG SL tablet Place 1 tablet (0.4 mg total) under the tongue every 5 (five) minutes as needed for chest pain.   Omega-3 Fatty Acids (FISH OIL) 1000 MG CAPS Take 1,000 mg by mouth daily.    rosuvastatin (CRESTOR) 20 MG tablet Take 1 tablet (20 mg total) by mouth daily.   sildenafil (REVATIO) 20 MG tablet TAKE 1 TO 2 TABLETS BY MOUTH EVERY DAY AS NEEDED   telmisartan (MICARDIS) 40 MG tablet Take 1 tablet (40 mg total) by mouth daily.   Immunization History  Administered Date(s) Administered   Fluad Quad(high Dose 65+) 04/09/2019, 04/04/2021   Influenza Split  05/11/2014   Influenza, High Dose Seasonal PF 04/23/2017, 04/29/2018, 04/09/2019, 05/05/2020, 05/10/2022   Influenza-Unspecified 04/25/2015, 04/23/2017   PFIZER(Purple Top)SARS-COV-2 Vaccination 08/28/2019, 09/18/2019   Tdap 03/11/2013       Objective:   Physical Exam BP 110/64 (BP Location: Left Arm, Cuff Size: Normal)   Pulse 65   Temp (!) 97.1 F (36.2 C)   Ht 5\' 7"  (1.702 m)   Wt 164 lb 12.8 oz (74.8 kg)   SpO2 96%   BMI 25.81 kg/m   SpO2: 96 % O2 Device: None (Room air)  GENERAL: Well-developed, well-nourished elderly gentleman, looks younger than stated age, in no acute distress, fully ambulatory.  No conversational dyspnea.  Prior. HEAD: Normocephalic, atraumatic.  EYES: Pupils equal, round, reactive to light.  No scleral icterus.  MOUTH: Oral mucosa moist, intact dentition. NECK: Supple. No thyromegaly. Trachea midline. No JVD.  No adenopathy. PULMONARY: Good air entry bilaterally.  Coarse, otherwise, no adventitious sounds. CARDIOVASCULAR: S1 and S2. Regular rate and rhythm.  No rubs, murmurs or gallops heard. ABDOMEN: Benign. MUSCULOSKELETAL: No joint deformity, no clubbing, no edema.  NEUROLOGIC: No focal deficit, no gait disturbance, speech is fluent. SKIN: Intact,warm,dry. PSYCH: Mood and behavior normal.    Lab Results  Component Value Date   NITRICOXIDE 13 10/25/2022      Assessment & Plan:     ICD-10-CM   1. Asthma-COPD overlap syndrome  J44.89 Nitric oxide   Well-controlled on Trelegy Ellipta No evidence of type II inflammation today Continue albuterol as needed    2. Bronchiectasis without complication ([place])  [id number]    Well compensated    3. Former smoker  Z87.891    No relapse     Will see the patient in follow-up in 4 to 6 months time.  Call sooner should any new problems arise.  Renold Don, MD Advanced Bronchoscopy PCCM [facility]    *This note was dictated using voice recognition software/Dragon.  Despite  best efforts to proofread, errors can occur which can change the meaning. Any transcriptional errors that result from this process are unintentional and may not be fully corrected at the time of dictation.

## 2022-10-25 NOTE — Patient Instructions (Signed)
Continue using your Trelegy 1 puff daily.  The level of inflammation in your airway was low today which is good.  We will see you in follow-up in 4 to 6 months time call sooner should any new problems arise.

## 2022-10-28 ENCOUNTER — Encounter: Payer: Self-pay | Admitting: Internal Medicine

## 2022-10-28 NOTE — Assessment & Plan Note (Signed)
Has known CAD.  Has previously been followed by Dr Ubaldo Glassing.  Continue risk factor modification.  Currently without acute symptoms. Request to establish care with Dr Rockey Situ

## 2022-10-28 NOTE — Assessment & Plan Note (Signed)
Low carb diet and exercise. Follow met b and a1c.   Lab Results  Component Value Date   HGBA1C 6.7 (H) 08/09/2022

## 2022-10-28 NOTE — Assessment & Plan Note (Signed)
Previously admitted with NSTEMI 08/2020.  09/12/20 - cath (50% mid/distal LAD).  No chest pain now.  Has been followed by Dr Ubaldo Glassing.  Continue micardis. Continue crestor. Request to establish care with Dr Rockey Situ.

## 2022-10-28 NOTE — Assessment & Plan Note (Signed)
Continue micardis 40mg  q day.  Follow pressures.  Follow metabolic panel.

## 2022-10-28 NOTE — Assessment & Plan Note (Signed)
Carotid ultrasound <40%.  Saw [facility].  Recommended f/u in 12 months.  

## 2022-10-28 NOTE — Assessment & Plan Note (Signed)
Colonoscopy 12/12/21:   RECTAL POLYP; HOT SNARE:  - TUBULAR ADENOMA WITH MUCOSAL PROLAPSE TYPE CHANGES.  - CAUTERIZED POLYP BASE FREE OF DYSPLASIA.  - NEGATIVE FOR HIGH-GRADE DYSPLASIA AND MALIGNANCY.   COLON POLYPS X2, ASCENDING; COLD BIOPSY AND COLD SNARE:  - MULTIPLE FRAGMENTS OF TUBULAR ADENOMAS.  - NEGATIVE FOR HIGH-GRADE DYSPLASIA AND MALIGNANCY. 

## 2022-10-28 NOTE — Assessment & Plan Note (Signed)
No arm pain.  Continue antiplatelet therapy.  Saw [facility] 01/2021.  Recommended f/u in one year. Per review, appears to have had f/u  01/2022.  

## 2022-10-28 NOTE — Assessment & Plan Note (Signed)
Established with pulmonary.  Continue trelegy.   Breathing overall improved on current regimen.

## 2022-10-28 NOTE — Assessment & Plan Note (Signed)
On crestor.  Low cholesterol diet and exercise.  Follow lipid panel and liver function tests.   

## 2022-10-28 NOTE — Assessment & Plan Note (Signed)
Saw pulmonary 01/2022.  Recommended f/u 6 months with PFTs prior.  Due to follow up 09/2022 - dr Patsey Berthold - continue trelegy and prn albuterol.

## 2022-10-28 NOTE — Assessment & Plan Note (Signed)
Evaluated by Dr Stoioff - 09/2021 - recommended CT urogram and cystoscopy.  Cysto (10/30/21) - No bladder mucosal abnormalities. Hematuria most likely secondary to BPH. Started finasteride 5 mg daily. Follow-up 1 year or earlier for recurrent hematuria 

## 2022-10-28 NOTE — Assessment & Plan Note (Signed)
Controlled on prilosec.   

## 2022-10-30 DIAGNOSIS — M9903 Segmental and somatic dysfunction of lumbar region: Secondary | ICD-10-CM | POA: Diagnosis not present

## 2022-10-30 DIAGNOSIS — M461 Sacroiliitis, not elsewhere classified: Secondary | ICD-10-CM | POA: Diagnosis not present

## 2022-10-30 DIAGNOSIS — M5441 Lumbago with sciatica, right side: Secondary | ICD-10-CM | POA: Diagnosis not present

## 2022-10-30 DIAGNOSIS — M5451 Vertebrogenic low back pain: Secondary | ICD-10-CM | POA: Diagnosis not present

## 2022-10-30 DIAGNOSIS — M9904 Segmental and somatic dysfunction of sacral region: Secondary | ICD-10-CM | POA: Diagnosis not present

## 2022-10-30 DIAGNOSIS — M5442 Lumbago with sciatica, left side: Secondary | ICD-10-CM | POA: Diagnosis not present

## 2022-11-01 ENCOUNTER — Ambulatory Visit: Payer: PPO | Admitting: Urology

## 2022-11-05 ENCOUNTER — Other Ambulatory Visit: Payer: Self-pay | Admitting: Internal Medicine

## 2022-11-06 DIAGNOSIS — M5442 Lumbago with sciatica, left side: Secondary | ICD-10-CM | POA: Diagnosis not present

## 2022-11-06 DIAGNOSIS — M461 Sacroiliitis, not elsewhere classified: Secondary | ICD-10-CM | POA: Diagnosis not present

## 2022-11-06 DIAGNOSIS — M9903 Segmental and somatic dysfunction of lumbar region: Secondary | ICD-10-CM | POA: Diagnosis not present

## 2022-11-06 DIAGNOSIS — M5451 Vertebrogenic low back pain: Secondary | ICD-10-CM | POA: Diagnosis not present

## 2022-11-06 DIAGNOSIS — M9904 Segmental and somatic dysfunction of sacral region: Secondary | ICD-10-CM | POA: Diagnosis not present

## 2022-11-06 DIAGNOSIS — M5441 Lumbago with sciatica, right side: Secondary | ICD-10-CM | POA: Diagnosis not present

## 2022-11-14 ENCOUNTER — Encounter: Payer: Self-pay | Admitting: Urology

## 2022-11-14 ENCOUNTER — Ambulatory Visit: Payer: PPO | Admitting: Urology

## 2022-11-14 VITALS — BP 138/68 | HR 62 | Ht 66.0 in | Wt 164.0 lb

## 2022-11-14 DIAGNOSIS — R310 Gross hematuria: Secondary | ICD-10-CM

## 2022-11-14 DIAGNOSIS — N400 Enlarged prostate without lower urinary tract symptoms: Secondary | ICD-10-CM

## 2022-11-14 DIAGNOSIS — Z87898 Personal history of other specified conditions: Secondary | ICD-10-CM

## 2022-11-14 LAB — MICROSCOPIC EXAMINATION: Bacteria, UA: NONE SEEN

## 2022-11-14 LAB — URINALYSIS, COMPLETE
Bilirubin, UA: NEGATIVE
Glucose, UA: NEGATIVE
Ketones, UA: NEGATIVE
Leukocytes,UA: NEGATIVE
Nitrite, UA: NEGATIVE
Protein,UA: NEGATIVE
RBC, UA: NEGATIVE
Specific Gravity, UA: 1.01 (ref 1.005–1.030)
Urobilinogen, Ur: 0.2 mg/dL (ref 0.2–1.0)
pH, UA: 6 (ref 5.0–7.5)

## 2022-11-14 NOTE — Progress Notes (Signed)
I, James Peck,acting as a scribe for James Altes, MD.,have documented all relevant documentation on the behalf of James Altes, MD,as directed by  James Altes, MD while in the presence of James Altes, MD.   11/14/22 3:11 PM   [name] 07/28/1941 [id number]  Referring provider: Dale , MD [address] [place],  [place] [address]  Chief Complaint  Patient presents with   Hematuria    Urologic history: 1.  Total gross painless hematuria July 2021 No upper tract abnormalities CTU Cystoscopy with prominent lateral lobe enlargement/hypervascularity  HPI: 82 y.o. male presents for annual follow-up.   Doing well since last visit No bothersome LUTS Denies dysuria, gross hematuria Denies flank, abdominal or pelvic pain  He did not think he was taking Finasteride, however, on chart review a 90-day supply was filled last month. His Plavix has been discontinued Cystoscopy performed 10/30/21 showed BPH with hypervascularity and he was started on Finasteride   PMH: Past Medical History:  Diagnosis Date   Allergy    Arthritis    Coronary artery disease    GERD (gastroesophageal reflux disease)    History of hiatal hernia    Hypercholesteremia    Hypertension     Surgical History: Past Surgical History:  Procedure Laterality Date   CARDIAC CATHETERIZATION     cardiac stents     CATARACT EXTRACTION W/PHACO Right 09/10/2017   Procedure: CATARACT EXTRACTION PHACO AND INTRAOCULAR LENS PLACEMENT (IOC);  Surgeon: Galen Manila, MD;  Location: [facility] ORS;  Service: Ophthalmology;  Laterality: Right;  [place] 01:01.7 AP% 15.3 CDE 9.38 Fluid pack lot # [id number]   CATARACT EXTRACTION W/PHACO Left 10/02/2017   Procedure: CATARACT EXTRACTION PHACO AND INTRAOCULAR LENS PLACEMENT (IOC);  Surgeon: Galen Manila, MD;  Location: [facility] ORS;  Service: Ophthalmology;  Laterality: Left;  [place] 00:42.3 AP% 16.2 CDE 6.85 Fluid Pack Lot # [id number]    COLONOSCOPY WITH PROPOFOL N/A 06/28/2017   Procedure: COLONOSCOPY WITH PROPOFOL;  Surgeon: Scot Jun, MD;  Location: [facility] ENDOSCOPY;  Service: Endoscopy;  Laterality: N/A;   COLONOSCOPY WITH PROPOFOL N/A 12/12/2021   Procedure: COLONOSCOPY WITH PROPOFOL;  Surgeon: Regis Bill, MD;  Location: [facility] ENDOSCOPY;  Service: Endoscopy;  Laterality: N/A;   CORONARY ANGIOPLASTY     STENTS X 2   EYE SURGERY     KNEE ARTHROSCOPY     LEFT HEART CATH AND CORONARY ANGIOGRAPHY N/A 09/12/2020   Procedure: LEFT HEART CATH AND CORONARY ANGIOGRAPHY;  Surgeon: Marcina Millard, MD;  Location: [facility] INVASIVE CV LAB;  Service: Cardiovascular;  Laterality: N/A;    Home Medications:  Allergies as of 11/14/2022       Reactions   Ace Inhibitors Cough   Enalapril Cough        Medication List        Accurate as of November 14, 2022  3:11 PM. If you have any questions, ask your nurse or doctor.          albuterol 108 (90 Base) MCG/ACT inhaler Commonly known as: VENTOLIN HFA Inhale 1 puff into the lungs every 6 (six) hours as needed for wheezing or shortness of breath.   aspirin EC 81 MG tablet Take 81 mg by mouth daily.   BLACK CHERRY CONCENTRATE PO Take 1,000 mg daily by mouth.   calcium carbonate 1250 (500 Ca) MG tablet Commonly known as: OS-CAL - dosed in mg of elemental calcium Take 1 tablet by mouth daily.   cetirizine 10  MG tablet Commonly known as: ZYRTEC TAKE 1 TABLET BY MOUTH DAILY   chlorpheniramine 4 MG tablet Commonly known as: CHLOR-TRIMETON Take 4 mg by mouth 2 (two) times daily as needed for allergies.   CINNAMON PO Take 1,200 mg by mouth 2 times daily at 12 noon and 4 pm.   dextromethorphan-guaiFENesin 30-600 MG 12hr tablet Commonly known as: MUCINEX DM Take 1 tablet by mouth 2 (two) times daily.   famotidine 20 MG tablet Commonly known as: PEPCID TAKE 1 TABLET BY MOUTH DAILY   finasteride 5 MG tablet Commonly known as: PROSCAR Take 1 tablet (5 mg  total) by mouth daily.   Fish Oil 1000 MG Caps Take 1,000 mg by mouth daily.   fluticasone 50 MCG/ACT nasal spray Commonly known as: FLONASE TAKE 2 SPRAYS INTO BOTH NOSTRILS DAILY   GLUCOSAMINE-CHONDROITIN PO Take 1 capsule by mouth daily.   nitroGLYCERIN 0.4 MG SL tablet Commonly known as: NITROSTAT Place 1 tablet (0.4 mg total) under the tongue every 5 (five) minutes as needed for chest pain.   rosuvastatin 20 MG tablet Commonly known as: CRESTOR Take 1 tablet (20 mg total) by mouth daily.   sildenafil 20 MG tablet Commonly known as: REVATIO TAKE 1 TO 2 TABLETS BY MOUTH EVERY DAY AS NEEDED   telmisartan 40 MG tablet Commonly known as: MICARDIS Take 1 tablet (40 mg total) by mouth daily.   Trelegy Ellipta 100-62.5-25 MCG/ACT Aepb Generic drug: Fluticasone-Umeclidin-Vilant Inhale 1 puff into the lungs daily.        Allergies:  Allergies  Allergen Reactions   Ace Inhibitors Cough   Enalapril Cough    Family History: Family History  Problem Relation Age of Onset   Heart disease Mother    Diabetes Mother    Heart disease Father    Diabetes Father    Alzheimer's disease Brother    Stroke Brother     Social History:  reports that he quit smoking about 24 years ago. His smoking use included cigarettes. He has a 40.00 pack-year smoking history. He has never used smokeless tobacco. He reports that he does not drink alcohol and does not use drugs.   Physical Exam: BP 138/68   Pulse 62   Ht  (1.676 m)   Wt 164 lb (74.4 kg)   BMI 26.47 kg/m   Constitutional:  Alert and oriented, No acute distress. HEENT: [place] AT, moist mucus membranes.  Trachea midline, no masses. Respiratory: Normal respiratory effort, no increased work of breathing. Psychiatric: Normal mood and affect.  Laboratory Data:  Urinalysis Dipstick/microscopy negative  Assessment & Plan:    1. History of gross hematuria No recurrent episodes He is no longer taking Plavix  2. BPH  without LUTS Cystoscopy performed 10/30/21 showed BPH with hypervascularity and he was started on Finasteride which he will continue Continue annual follow-up  I have reviewed the above documentation for accuracy and completeness, and I agree with the above.   James Altes, MD  [facility] [address], [address] [place], [place] [address] [phone]

## 2022-11-15 ENCOUNTER — Encounter: Payer: Self-pay | Admitting: Internal Medicine

## 2022-11-15 DIAGNOSIS — N400 Enlarged prostate without lower urinary tract symptoms: Secondary | ICD-10-CM | POA: Insufficient documentation

## 2023-01-06 NOTE — Progress Notes (Unsigned)
Cardiology Office Note  Date:  01/06/2023   ID:  [name], DOB [birth year], MRN [id number]  PCP:  Dale Teasdale, MD   No chief complaint on file.   HPI:  [name] with past medical history of Coronary artery disease, stents to RCA low risk 81 and LAD April 2014 Hyperlipidemia Essential hypertension Former smoker PAD, carotid less than 39% disease bilaterally followed by vascular Who presents by referral from Dr. Lorin Picket to establish care for his coronary artery disease    cardiac cath with last cath Aug 1999 showing 95% RCA lesion, status post angioplasty and stenting at [facility]. Repeat Cardiac cath 11/05/12 for recurrent chest pain showed a 99% LAD. He is status post stent placement   His prior stenting of his RCA in 1999, remains open. Normal left ventricular function.   PMH:   has a past medical history of Allergy, Arthritis, Coronary artery disease, GERD (gastroesophageal reflux disease), History of hiatal hernia, Hypercholesteremia, and Hypertension.  PSH:    Past Surgical History:  Procedure Laterality Date   CARDIAC CATHETERIZATION     cardiac stents     CATARACT EXTRACTION W/PHACO Right 09/10/2017   Procedure: CATARACT EXTRACTION PHACO AND INTRAOCULAR LENS PLACEMENT (IOC);  Surgeon: Galen Manila, MD;  Location: [facility] ORS;  Service: Ophthalmology;  Laterality: Right;  [place] 01:01.7 AP% 15.3 CDE 9.38 Fluid pack lot # [id number]   CATARACT EXTRACTION W/PHACO Left 10/02/2017   Procedure: CATARACT EXTRACTION PHACO AND INTRAOCULAR LENS PLACEMENT (IOC);  Surgeon: Galen Manila, MD;  Location: [facility] ORS;  Service: Ophthalmology;  Laterality: Left;  [place] 00:42.3 AP% 16.2 CDE 6.85 Fluid Pack Lot # [id number]   COLONOSCOPY WITH PROPOFOL N/A 06/28/2017   Procedure: COLONOSCOPY WITH PROPOFOL;  Surgeon: Scot Jun, MD;  Location: [facility] ENDOSCOPY;  Service: Endoscopy;  Laterality: N/A;   COLONOSCOPY WITH PROPOFOL N/A 12/12/2021   Procedure:  COLONOSCOPY WITH PROPOFOL;  Surgeon: Regis Bill, MD;  Location: [facility] ENDOSCOPY;  Service: Endoscopy;  Laterality: N/A;   CORONARY ANGIOPLASTY     STENTS X 2   EYE SURGERY     KNEE ARTHROSCOPY     LEFT HEART CATH AND CORONARY ANGIOGRAPHY N/A 09/12/2020   Procedure: LEFT HEART CATH AND CORONARY ANGIOGRAPHY;  Surgeon: Marcina Millard, MD;  Location: [facility] INVASIVE CV LAB;  Service: Cardiovascular;  Laterality: N/A;    Current Outpatient Medications  Medication Sig Dispense Refill   albuterol (VENTOLIN HFA) 108 (90 Base) MCG/ACT inhaler Inhale 1 puff into the lungs every 6 (six) hours as needed for wheezing or shortness of breath.     aspirin EC 81 MG tablet Take 81 mg by mouth daily.      calcium carbonate (OS-CAL - DOSED IN MG OF ELEMENTAL CALCIUM) 1250 (500 CA) MG tablet Take 1 tablet by mouth daily.      cetirizine (ZYRTEC) 10 MG tablet TAKE 1 TABLET BY MOUTH DAILY 90 tablet 1   chlorpheniramine (CHLOR-TRIMETON) 4 MG tablet Take 4 mg by mouth 2 (two) times daily as needed for allergies.     CINNAMON PO Take 1,200 mg by mouth 2 times daily at 12 noon and 4 pm.     dextromethorphan-guaiFENesin (MUCINEX DM) 30-600 MG 12hr tablet Take 1 tablet by mouth 2 (two) times daily.     famotidine (PEPCID) 20 MG tablet TAKE 1 TABLET BY MOUTH DAILY 90 tablet 1   finasteride (PROSCAR) 5 MG tablet Take 1 tablet (5 mg total) by mouth daily. 90 tablet 3  fluticasone (FLONASE) 50 MCG/ACT nasal spray TAKE 2 SPRAYS INTO BOTH NOSTRILS DAILY 48 g 3   Fluticasone-Umeclidin-Vilant (TRELEGY ELLIPTA) 100-62.5-25 MCG/ACT AEPB Inhale 1 puff into the lungs daily. 14 each 0   GLUCOSAMINE-CHONDROITIN PO Take 1 capsule by mouth daily.     Misc Natural Products (BLACK CHERRY CONCENTRATE PO) Take 1,000 mg daily by mouth.     nitroGLYCERIN (NITROSTAT) 0.4 MG SL tablet Place 1 tablet (0.4 mg total) under the tongue every 5 (five) minutes as needed for chest pain. 30 tablet 0   Omega-3 Fatty Acids (FISH OIL) 1000 MG  CAPS Take 1,000 mg by mouth daily.      rosuvastatin (CRESTOR) 20 MG tablet Take 1 tablet (20 mg total) by mouth daily. 30 tablet 0   sildenafil (REVATIO) 20 MG tablet TAKE 1 TO 2 TABLETS BY MOUTH EVERY DAY AS NEEDED 60 tablet 0   telmisartan (MICARDIS) 40 MG tablet Take 1 tablet (40 mg total) by mouth daily. 30 tablet 3   No current facility-administered medications for this visit.     Allergies:   Ace inhibitors and Enalapril   Social History:  The patient  reports that he quit smoking about 24 years ago. His smoking use included cigarettes. He has a 40.00 pack-year smoking history. He has never used smokeless tobacco. He reports that he does not drink alcohol and does not use drugs.   Family History:   family history includes Alzheimer's disease in his brother; Diabetes in his father and mother; Heart disease in his father and mother; Stroke in his brother.    Review of Systems: ROS   PHYSICAL EXAM: VS:  There were no vitals taken for this visit. , BMI There is no height or weight on file to calculate BMI. GEN: Well nourished, well developed, in no acute distress HEENT: normal Neck: no JVD, carotid bruits, or masses Cardiac: RRR; no murmurs, rubs, or gallops,no edema  Respiratory:  clear to auscultation bilaterally, normal work of breathing GI: soft, nontender, nondistended, + BS MS: no deformity or atrophy Skin: warm and dry, no rash Neuro:  Strength and sensation are intact Psych: euthymic mood, full affect    Recent Labs: 08/09/2022: ALT 34; BUN 14; Creatinine, Ser 0.95; Hemoglobin 14.4; Platelets 280.0; Potassium 4.5; Sodium 134    Lipid Panel Lab Results  Component Value Date   CHOL 159 08/09/2022   HDL 54.60 08/09/2022   LDLCALC 89 08/09/2022   TRIG 77.0 08/09/2022      Wt Readings from Last 3 Encounters:  11/14/22 164 lb (74.4 kg)  10/25/22 164 lb 12.8 oz (74.8 kg)  10/17/22 166 lb 12.8 oz (75.7 kg)       ASSESSMENT AND PLAN:  Problem List Items  Addressed This Visit   None    Disposition:   F/U  12 months   Total encounter time more than 30 minutes  Greater than 50% was spent in counseling and coordination of care with the patient    Signed, Dossie Arbour, M.D., Ph.D. [facility] [place], [place] [phone]

## 2023-01-07 ENCOUNTER — Encounter: Payer: Self-pay | Admitting: Cardiovascular Disease

## 2023-01-07 ENCOUNTER — Ambulatory Visit: Payer: PPO | Attending: Cardiovascular Disease | Admitting: Cardiovascular Disease

## 2023-01-07 VITALS — BP 140/80 | HR 73 | Ht 67.0 in | Wt 166.2 lb

## 2023-01-07 DIAGNOSIS — I10 Essential (primary) hypertension: Secondary | ICD-10-CM | POA: Diagnosis not present

## 2023-01-07 DIAGNOSIS — I771 Stricture of artery: Secondary | ICD-10-CM | POA: Diagnosis not present

## 2023-01-07 DIAGNOSIS — I779 Disorder of arteries and arterioles, unspecified: Secondary | ICD-10-CM

## 2023-01-07 DIAGNOSIS — E118 Type 2 diabetes mellitus with unspecified complications: Secondary | ICD-10-CM

## 2023-01-07 DIAGNOSIS — E782 Mixed hyperlipidemia: Secondary | ICD-10-CM | POA: Diagnosis not present

## 2023-01-07 DIAGNOSIS — I25118 Atherosclerotic heart disease of native coronary artery with other forms of angina pectoris: Secondary | ICD-10-CM | POA: Diagnosis not present

## 2023-01-07 MED ORDER — EZETIMIBE 10 MG PO TABS: 10.0000 mg | ORAL_TABLET | Freq: Every day | ORAL | 3 refills | Status: DC

## 2023-01-07 MED ORDER — NITROGLYCERIN 0.4 MG SL SUBL: 0.4000 mg | SUBLINGUAL_TABLET | SUBLINGUAL | 1 refills | Status: AC | PRN

## 2023-01-07 MED ORDER — ROSUVASTATIN CALCIUM 20 MG PO TABS: 20.0000 mg | ORAL_TABLET | Freq: Every day | ORAL | 3 refills | Status: DC

## 2023-01-07 NOTE — Patient Instructions (Signed)
Medication Instructions:  °Please start  °zetia 10 mg daily for cholesterol ° °If you need a refill on your cardiac medications before your next appointment, please call your pharmacy.  ° °Lab work: °No new labs needed ° °Testing/Procedures: °No new testing needed ° °Follow-Up: °At [facility], you and your health needs are our priority.  As part of our continuing mission to provide you with exceptional heart care, we have created designated Provider Care Teams.  These Care Teams include your primary Cardiologist (physician) and Advanced Practice Providers (APPs -  Physician Assistants and Nurse Practitioners) who all work together to provide you with the care you need, when you need it. ° °You will need a follow up appointment in 12 months ° °Providers on your designated Care Team:   °Christopher Berge, NP °Ryan Dunn, PA-C °Cadence Furth, PA-C ° °COVID-19 Vaccine Information can be found at: [email or web address] For questions related to vaccine distribution or appointments, please email [email or web address] or call [phone].  ° °

## 2023-01-21 ENCOUNTER — Other Ambulatory Visit (INDEPENDENT_AMBULATORY_CARE_PROVIDER_SITE_OTHER): Payer: Self-pay | Admitting: Vascular Surgery

## 2023-01-21 DIAGNOSIS — I6523 Occlusion and stenosis of bilateral carotid arteries: Secondary | ICD-10-CM

## 2023-01-24 ENCOUNTER — Ambulatory Visit (INDEPENDENT_AMBULATORY_CARE_PROVIDER_SITE_OTHER): Payer: PPO

## 2023-01-24 ENCOUNTER — Encounter (INDEPENDENT_AMBULATORY_CARE_PROVIDER_SITE_OTHER): Payer: Self-pay | Admitting: Nurse Practitioner

## 2023-01-24 ENCOUNTER — Ambulatory Visit (INDEPENDENT_AMBULATORY_CARE_PROVIDER_SITE_OTHER): Payer: PPO | Admitting: Nurse Practitioner

## 2023-01-24 VITALS — BP 140/63 | HR 60 | Resp 16 | Wt 163.4 lb

## 2023-01-24 DIAGNOSIS — I10 Essential (primary) hypertension: Secondary | ICD-10-CM | POA: Diagnosis not present

## 2023-01-24 DIAGNOSIS — I6523 Occlusion and stenosis of bilateral carotid arteries: Secondary | ICD-10-CM

## 2023-01-24 DIAGNOSIS — E785 Hyperlipidemia, unspecified: Secondary | ICD-10-CM

## 2023-01-24 DIAGNOSIS — I739 Peripheral vascular disease, unspecified: Secondary | ICD-10-CM

## 2023-01-24 NOTE — Progress Notes (Signed)
Subjective:    Patient ID: James Peck, male    DOB: [birth year], 82 y.o.   MRN: [id number] Chief Complaint  Patient presents with   Follow-up    Ultrasound follow up    The patient is seen for follow up evaluation of carotid stenosis. The carotid stenosis followed by ultrasound.   The patient denies amaurosis fugax. There is no recent history of TIA symptoms or focal motor deficits. There is no prior documented CVA.  The patient is taking enteric-coated aspirin 81 mg daily.  There is no history of migraine headaches. There is no history of seizures.  No recent shortening of the patient's walking distance or new symptoms consistent with claudication.  No history of rest pain symptoms. No new ulcers or wounds of the lower extremities have occurred.  There is no history of DVT, PE or superficial thrombophlebitis. No documented recent episodes of angina or shortness of breath documented.   Carotid Duplex done today shows <40%.  No change compared to last study in 2023  Vertebral arteries were well-visualized today with no evidence of stenosis and normal flow hemodynamics in the bilateral subclavian arteries.    Review of Systems  Neurological:  Negative for dizziness.  All other systems reviewed and are negative.      Objective:   Physical Exam Vitals reviewed.  HENT:     Head: Normocephalic.  Neck:     Vascular: No carotid bruit.  Cardiovascular:     Rate and Rhythm: Normal rate.     Pulses: Normal pulses.  Pulmonary:     Effort: Pulmonary effort is normal.  Skin:    General: Skin is warm and dry.  Neurological:     Mental Status: He is alert and oriented to person, place, and time.  Psychiatric:        Mood and Affect: Mood normal.        Behavior: Behavior normal.        Thought Content: Thought content normal.        Judgment: Judgment normal.     BP (!) 140/63 (BP Location: Left Arm)   Pulse 60   Resp 16   Wt 163 lb 6.4 oz (74.1 kg)   BMI 25.59 kg/m    Past Medical History:  Diagnosis Date   Allergy    Arthritis    Coronary artery disease    GERD (gastroesophageal reflux disease)    History of hiatal hernia    Hypercholesteremia    Hypertension     Social History   Socioeconomic History   Marital status: Married    Spouse name: Not on file   Number of children: Not on file   Years of education: Not on file   Highest education level: Not on file  Occupational History   Not on file  Tobacco Use   Smoking status: Former    Packs/day: 1.00    Years: 40.00    Additional pack years: 0.00    Total pack years: 40.00    Types: Cigarettes    Quit date: 2000    Years since quitting: 24.5   Smokeless tobacco: Never  Vaping Use   Vaping Use: Never used  Substance and Sexual Activity   Alcohol use: No   Drug use: No   Sexual activity: Not on file  Other Topics Concern   Not on file  Social History Narrative   Not on file   Social Determinants of Health   Financial Resource Strain: Low  Risk  (07/16/2022)   Overall Financial Resource Strain (CARDIA)    Difficulty of Paying Living Expenses: Not hard at all  Food Insecurity: No Food Insecurity (07/16/2022)   Hunger Vital Sign    Worried About Running Out of Food in the Last Year: Never true    Ran Out of Food in the Last Year: Never true  Transportation Needs: No Transportation Needs (07/16/2022)   PRAPARE - Administrator, Civil Service (Medical): No    Lack of Transportation (Non-Medical): No  Physical Activity: Sufficiently Active (07/16/2022)   Exercise Vital Sign    Days of Exercise per Week: 5 days    Minutes of Exercise per Session: 30 min  Stress: No Stress Concern Present (07/16/2022)   [employer] of Occupational Health - Occupational Stress Questionnaire    Feeling of Stress : Not at all  Social Connections: Unknown (07/16/2022)   Social Connection and Isolation Panel [NHANES]    Frequency of Communication with Friends and Family: Not on  file    Frequency of Social Gatherings with Friends and Family: Not on file    Attends Religious Services: Not on file    Active Member of Clubs or Organizations: Not on file    Attends Banker Meetings: Not on file    Marital Status: Married  Intimate Partner Violence: Not At Risk (07/16/2022)   Humiliation, Afraid, Rape, and Kick questionnaire    Fear of Current or Ex-Partner: No    Emotionally Abused: No    Physically Abused: No    Sexually Abused: No    Past Surgical History:  Procedure Laterality Date   CARDIAC CATHETERIZATION     cardiac stents     CATARACT EXTRACTION W/PHACO Right 09/10/2017   Procedure: CATARACT EXTRACTION PHACO AND INTRAOCULAR LENS PLACEMENT (IOC);  Surgeon: Galen Manila, MD;  Location: [facility] ORS;  Service: Ophthalmology;  Laterality: Right;  [place] 01:01.7 AP% 15.3 CDE 9.38 Fluid pack lot # [id number]   CATARACT EXTRACTION W/PHACO Left 10/02/2017   Procedure: CATARACT EXTRACTION PHACO AND INTRAOCULAR LENS PLACEMENT (IOC);  Surgeon: Galen Manila, MD;  Location: [facility] ORS;  Service: Ophthalmology;  Laterality: Left;  [place] 00:42.3 AP% 16.2 CDE 6.85 Fluid Pack Lot # [id number]   COLONOSCOPY WITH PROPOFOL N/A 06/28/2017   Procedure: COLONOSCOPY WITH PROPOFOL;  Surgeon: Scot Jun, MD;  Location: [facility] ENDOSCOPY;  Service: Endoscopy;  Laterality: N/A;   COLONOSCOPY WITH PROPOFOL N/A 12/12/2021   Procedure: COLONOSCOPY WITH PROPOFOL;  Surgeon: Regis Bill, MD;  Location: [facility] ENDOSCOPY;  Service: Endoscopy;  Laterality: N/A;   CORONARY ANGIOPLASTY     STENTS X 2   EYE SURGERY     KNEE ARTHROSCOPY     LEFT HEART CATH AND CORONARY ANGIOGRAPHY N/A 09/12/2020   Procedure: LEFT HEART CATH AND CORONARY ANGIOGRAPHY;  Surgeon: Marcina Millard, MD;  Location: [facility] INVASIVE CV LAB;  Service: Cardiovascular;  Laterality: N/A;    Family History  Problem Relation Age of Onset   Heart disease Mother    Diabetes Mother    Heart disease  Father    Diabetes Father    Alzheimer's disease Brother    Stroke Brother     Allergies  Allergen Reactions   Ace Inhibitors Cough   Enalapril Cough       Latest Ref Rng & Units 08/09/2022    7:48 AM 05/22/2022    7:45 AM 03/31/2021    7:33 AM  CBC  WBC 4.0 - 10.5 K/uL 6.0  7.2  5.7   Hemoglobin 13.0 - 17.0 g/dL 44.0  10.2  72.5   Hematocrit 39.0 - 52.0 % 42.7  44.6  43.0   Platelets 150.0 - 400.0 K/uL 280.0  306.0  299.0       CMP     Component Value Date/Time   NA 134 (L) 08/09/2022 0748   NA 137 11/06/2012 0034   K 4.5 08/09/2022 0748   K 3.8 11/06/2012 0034   CL 101 08/09/2022 0748   CL 104 11/06/2012 0034   CO2 26 08/09/2022 0748   CO2 26 11/06/2012 0034   GLUCOSE 115 (H) 08/09/2022 0748   GLUCOSE 127 (H) 11/06/2012 0034   BUN 14 08/09/2022 0748   BUN 12 11/06/2012 0034   CREATININE 0.95 08/09/2022 0748   CREATININE 0.96 11/06/2012 0034   CALCIUM 9.1 08/09/2022 0748   CALCIUM 8.3 (L) 11/06/2012 0034   PROT 6.2 08/09/2022 0748   ALBUMIN 4.2 08/09/2022 0748   AST 22 08/09/2022 0748   ALT 34 08/09/2022 0748   ALKPHOS 49 08/09/2022 0748   BILITOT 0.6 08/09/2022 0748   GFR 75.38 08/09/2022 0748   GFRNONAA >60 09/12/2020 2355   GFRNONAA >60 11/06/2012 0034     No results found.     Assessment & Plan:   1. Bilateral carotid artery stenosis Recommend:  Given the patient's asymptomatic subcritical stenosis no further invasive testing or surgery at this time.  Duplex ultrasound shows <40% stenosis bilaterally.  Continue antiplatelet therapy as prescribed Continue management of CAD, HTN and Hyperlipidemia Healthy heart diet,  encouraged exercise at least 4 times per week Follow up in 12 months with duplex ultrasound and physical exam   2. Benign essential HTN Continue antihypertensive medications as already ordered, these medications have been reviewed and there are no changes at this time.  3. Hyperlipidemia, unspecified hyperlipidemia  type Continue statin as ordered and reviewed, no changes at this time  4. Subclavian artery disease (HCC) Normal flow hemodynamics in the bilateral subclavian arteries with relatively normal blood pressures bilaterally.  No role for intervention currently.   Current Outpatient Medications on File Prior to Visit  Medication Sig Dispense Refill   albuterol (VENTOLIN HFA) 108 (90 Base) MCG/ACT inhaler Inhale 1 puff into the lungs every 6 (six) hours as needed for wheezing or shortness of breath.     aspirin EC 81 MG tablet Take 81 mg by mouth daily.      calcium carbonate (OS-CAL - DOSED IN MG OF ELEMENTAL CALCIUM) 1250 (500 CA) MG tablet Take 1 tablet by mouth daily.      cetirizine (ZYRTEC) 10 MG tablet TAKE 1 TABLET BY MOUTH DAILY 90 tablet 1   CINNAMON PO Take 1,200 mg by mouth 2 times daily at 12 noon and 4 pm.     ezetimibe (ZETIA) 10 MG tablet Take 1 tablet (10 mg total) by mouth daily. 90 tablet 3   famotidine (PEPCID) 20 MG tablet TAKE 1 TABLET BY MOUTH DAILY 90 tablet 1   finasteride (PROSCAR) 5 MG tablet Take 1 tablet (5 mg total) by mouth daily. 90 tablet 3   fluticasone (FLONASE) 50 MCG/ACT nasal spray TAKE 2 SPRAYS INTO BOTH NOSTRILS DAILY 48 g 3   Fluticasone-Umeclidin-Vilant (TRELEGY ELLIPTA) 100-62.5-25 MCG/ACT AEPB Inhale 1 puff into the lungs daily. 14 each 0   GLUCOSAMINE-CHONDROITIN PO Take 1 capsule by mouth daily.     Misc Natural Products (BLACK CHERRY CONCENTRATE PO) Take 1,000 mg daily by mouth.  nitroGLYCERIN (NITROSTAT) 0.4 MG SL tablet Place 1 tablet (0.4 mg total) under the tongue every 5 (five) minutes as needed for chest pain. 25 tablet 1   Omega-3 Fatty Acids (FISH OIL) 1000 MG CAPS Take 1,000 mg by mouth daily.      rosuvastatin (CRESTOR) 20 MG tablet Take 1 tablet (20 mg total) by mouth daily. 90 tablet 3   sildenafil (REVATIO) 20 MG tablet TAKE 1 TO 2 TABLETS BY MOUTH EVERY DAY AS NEEDED 60 tablet 0   telmisartan (MICARDIS) 40 MG tablet Take 1 tablet (40  mg total) by mouth daily. 30 tablet 3   chlorpheniramine (CHLOR-TRIMETON) 4 MG tablet Take 4 mg by mouth 2 (two) times daily as needed for allergies. (Patient not taking: Reported on 01/07/2023)     dextromethorphan-guaiFENesin (MUCINEX DM) 30-600 MG 12hr tablet Take 1 tablet by mouth 2 (two) times daily. (Patient not taking: Reported on 01/07/2023)     No current facility-administered medications on file prior to visit.    There are no Patient Instructions on file for this visit. No follow-ups on file.   Georgiana Spinner, NP

## 2023-02-13 ENCOUNTER — Other Ambulatory Visit (INDEPENDENT_AMBULATORY_CARE_PROVIDER_SITE_OTHER): Payer: PPO

## 2023-02-13 DIAGNOSIS — E785 Hyperlipidemia, unspecified: Secondary | ICD-10-CM

## 2023-02-13 DIAGNOSIS — R739 Hyperglycemia, unspecified: Secondary | ICD-10-CM

## 2023-02-13 DIAGNOSIS — I10 Essential (primary) hypertension: Secondary | ICD-10-CM | POA: Diagnosis not present

## 2023-02-13 LAB — BASIC METABOLIC PANEL
BUN: 12 mg/dL (ref 6–23)
CO2: 25 mEq/L (ref 19–32)
Calcium: 9.7 mg/dL (ref 8.4–10.5)
Chloride: 101 mEq/L (ref 96–112)
Creatinine, Ser: 0.98 mg/dL (ref 0.40–1.50)
GFR: 72.36 mL/min (ref >60.00)
Glucose, Bld: 113 mg/dL — ABNORMAL HIGH (ref 70–99)
Potassium: 4.3 mEq/L (ref 3.5–5.1)
Sodium: 134 mEq/L — ABNORMAL LOW (ref 135–145)

## 2023-02-13 LAB — TSH: TSH: 2.46 u[IU]/mL (ref 0.35–5.50)

## 2023-02-13 LAB — LIPID PANEL
Cholesterol: 104 mg/dL (ref 0–200)
HDL: 46.1 mg/dL (ref >39.00)
LDL Cholesterol: 43 mg/dL (ref 0–99)
NonHDL: 57.86
Total CHOL/HDL Ratio: 2
Triglycerides: 73 mg/dL (ref 0.0–149.0)
VLDL: 14.6 mg/dL (ref 0.0–40.0)

## 2023-02-13 LAB — HEMOGLOBIN A1C: Hgb A1c MFr Bld: 6.5 % (ref 4.6–6.5)

## 2023-02-13 LAB — HEPATIC FUNCTION PANEL
ALT: 25 U/L (ref 0–53)
AST: 25 U/L (ref 0–37)
Albumin: 4.5 g/dL (ref 3.5–5.2)
Alkaline Phosphatase: 52 U/L (ref 39–117)
Bilirubin, Direct: 0.2 mg/dL (ref 0.0–0.3)
Total Bilirubin: 0.7 mg/dL (ref 0.2–1.2)
Total Protein: 6.8 g/dL (ref 6.0–8.3)

## 2023-02-17 NOTE — Progress Notes (Signed)
Subjective:    Patient ID: James Peck, male    DOB: May 30, 1941, 82 y.o.   MRN: [id number]  Patient here for  Chief Complaint  Patient presents with   Medical Management of Chronic Issues    HPI Here to follow up regarding asthma/COPD, bronchiectasis - on trelegy, hypertension, GERD and hypercholesterolemia. On micardis 40mg  q day for blood pressure.  Using trelegy.  Breathing stable. Had f/u with urology - 10/2022 - off plavix.  No recurring episodes of hematuria.  Cystoscopy 10/30/21 - BPH with hypervascularity and was started on finasteride. No changes made.  Recommended f/u in one year.  F/u cardiology 01/07/23 - CAD - cath 2022 - nonobstructive disease, patent stents. On crestor 20mg .  Added zetia. Saw [facility] 01/24/23 - Carotid Duplex done today shows <40%.  No change compared to last study in 2023. Vertebral arteries were well-visualized today with no evidence of stenosis and normal flow hemodynamics in the bilateral subclavian arteries. Recommended continued antiplatelet therapy and f/u 12 months. Is up to date with follow ups and currently on yearly schedule with urology and vascular.  He is doing well.  No chest pain.  Breathing stable.  No cough or congestion.  No abdominal pain. Bowels stable.    Past Medical History:  Diagnosis Date   Allergy    Arthritis    Coronary artery disease    GERD (gastroesophageal reflux disease)    History of hiatal hernia    Hypercholesteremia    Hypertension    Past Surgical History:  Procedure Laterality Date   CARDIAC CATHETERIZATION     cardiac stents     CATARACT EXTRACTION W/PHACO Right 09/10/2017   Procedure: CATARACT EXTRACTION PHACO AND INTRAOCULAR LENS PLACEMENT (IOC);  Surgeon: Galen Manila, MD;  Location: [facility] ORS;  Service: Ophthalmology;  Laterality: Right;  [place] 01:01.7 AP% 15.3 CDE 9.38 Fluid pack lot # [id number]   CATARACT EXTRACTION W/PHACO Left 10/02/2017   Procedure: CATARACT EXTRACTION PHACO AND INTRAOCULAR LENS PLACEMENT  (IOC);  Surgeon: Galen Manila, MD;  Location: [facility] ORS;  Service: Ophthalmology;  Laterality: Left;  [place] 00:42.3 AP% 16.2 CDE 6.85 Fluid Pack Lot # [id number]   COLONOSCOPY WITH PROPOFOL N/A 06/28/2017   Procedure: COLONOSCOPY WITH PROPOFOL;  Surgeon: Scot Jun, MD;  Location: [facility] ENDOSCOPY;  Service: Endoscopy;  Laterality: N/A;   COLONOSCOPY WITH PROPOFOL N/A 12/12/2021   Procedure: COLONOSCOPY WITH PROPOFOL;  Surgeon: Regis Bill, MD;  Location: [facility] ENDOSCOPY;  Service: Endoscopy;  Laterality: N/A;   CORONARY ANGIOPLASTY     STENTS X 2   EYE SURGERY     KNEE ARTHROSCOPY     LEFT HEART CATH AND CORONARY ANGIOGRAPHY N/A 09/12/2020   Procedure: LEFT HEART CATH AND CORONARY ANGIOGRAPHY;  Surgeon: Marcina Millard, MD;  Location: [facility] INVASIVE CV LAB;  Service: Cardiovascular;  Laterality: N/A;   Family History  Problem Relation Age of Onset   Heart disease Mother    Diabetes Mother    Heart disease Father    Diabetes Father    Alzheimer's disease Brother    Stroke Brother    Social History   Socioeconomic History   Marital status: Married    Spouse name: Not on file   Number of children: Not on file   Years of education: Not on file   Highest education level: 12th grade  Occupational History   Not on file  Tobacco Use   Smoking status: Former    Current packs/day: 0.00    Average  packs/day: 1 pack/day for 40.0 years (40.0 ttl pk-yrs)    Types: Cigarettes    Start date: 42    Quit date: 2000    Years since quitting: 24.5   Smokeless tobacco: Never  Vaping Use   Vaping status: Never Used  Substance and Sexual Activity   Alcohol use: No   Drug use: No   Sexual activity: Not on file  Other Topics Concern   Not on file  Social History Narrative   Not on file   Social Determinants of Health   Financial Resource Strain: Low Risk  (02/14/2023)   Overall Financial Resource Strain (CARDIA)    Difficulty of Paying Living Expenses: Not hard at all   Food Insecurity: No Food Insecurity (02/14/2023)   Hunger Vital Sign    Worried About Running Out of Food in the Last Year: Never true    Ran Out of Food in the Last Year: Never true  Transportation Needs: No Transportation Needs (02/14/2023)   PRAPARE - Administrator, Civil Service (Medical): No    Lack of Transportation (Non-Medical): No  Physical Activity: Sufficiently Active (02/14/2023)   Exercise Vital Sign    Days of Exercise per Week: 5 days    Minutes of Exercise per Session: 30 min  Stress: No Stress Concern Present (02/14/2023)   [employer] of Occupational Health - Occupational Stress Questionnaire    Feeling of Stress : Not at all  Social Connections: Socially Integrated (02/14/2023)   Social Connection and Isolation Panel [NHANES]    Frequency of Communication with Friends and Family: More than three times a week    Frequency of Social Gatherings with Friends and Family: Three times a week    Attends Religious Services: More than 4 times per year    Active Member of Clubs or Organizations: Yes    Attends Banker Meetings: 1 to 4 times per year    Marital Status: Married     Review of Systems  Constitutional:  Negative for appetite change and unexpected weight change.  HENT:  Negative for congestion and sinus pressure.   Respiratory:  Negative for cough, chest tightness and shortness of breath.   Cardiovascular:  Negative for chest pain, palpitations and leg swelling.  Gastrointestinal:  Negative for abdominal pain, diarrhea, nausea and vomiting.  Genitourinary:  Negative for difficulty urinating and dysuria.  Musculoskeletal:  Negative for joint swelling and myalgias.  Skin:  Negative for color change and rash.  Neurological:  Negative for dizziness and headaches.  Psychiatric/Behavioral:  Negative for agitation and dysphoric mood.        Objective:     BP 122/68   Pulse 74   Ht 5\' 6"  (1.676 m)   Wt 164 lb 12.8 oz (74.8 kg)    SpO2 97%   BMI 26.60 kg/m  Wt Readings from Last 3 Encounters:  02/23/23 164 lb 12.8 oz (74.8 kg)  01/24/23 163 lb 6.4 oz (74.1 kg)  01/07/23 166 lb 4 oz (75.4 kg)    Physical Exam Vitals reviewed.  Constitutional:      General: He is not in acute distress.    Appearance: Normal appearance. He is well-developed.  HENT:     Head: Normocephalic and atraumatic.     Right Ear: External ear normal.     Left Ear: External ear normal.  Eyes:     General: No scleral icterus.       Right eye: No discharge.  Left eye: No discharge.     Conjunctiva/sclera: Conjunctivae normal.  Cardiovascular:     Rate and Rhythm: Normal rate and regular rhythm.  Pulmonary:     Effort: Pulmonary effort is normal. No respiratory distress.     Breath sounds: Normal breath sounds.  Abdominal:     General: Bowel sounds are normal.     Palpations: Abdomen is soft.     Tenderness: There is no abdominal tenderness.  Musculoskeletal:        General: No swelling or tenderness.     Cervical back: Neck supple. No tenderness.  Lymphadenopathy:     Cervical: No cervical adenopathy.  Skin:    Findings: No erythema or rash.  Neurological:     Mental Status: He is alert.  Psychiatric:        Mood and Affect: Mood normal.        Behavior: Behavior normal.      Outpatient Encounter Medications as of 02/18/2023  Medication Sig   albuterol (VENTOLIN HFA) 108 (90 Base) MCG/ACT inhaler Inhale 1 puff into the lungs every 6 (six) hours as needed for wheezing or shortness of breath.   aspirin EC 81 MG tablet Take 81 mg by mouth daily.    calcium carbonate (OS-CAL - DOSED IN MG OF ELEMENTAL CALCIUM) 1250 (500 CA) MG tablet Take 1 tablet by mouth daily.    cetirizine (ZYRTEC) 10 MG tablet TAKE 1 TABLET BY MOUTH DAILY   CINNAMON PO Take 1,200 mg by mouth 2 times daily at 12 noon and 4 pm.   ezetimibe (ZETIA) 10 MG tablet Take 1 tablet (10 mg total) by mouth daily.   famotidine (PEPCID) 20 MG tablet TAKE 1  TABLET BY MOUTH DAILY   finasteride (PROSCAR) 5 MG tablet Take 1 tablet (5 mg total) by mouth daily.   fluticasone (FLONASE) 50 MCG/ACT nasal spray TAKE 2 SPRAYS INTO BOTH NOSTRILS DAILY   Fluticasone-Umeclidin-Vilant (TRELEGY ELLIPTA) 100-62.5-25 MCG/ACT AEPB Inhale 1 puff into the lungs daily.   GLUCOSAMINE-CHONDROITIN PO Take 1 capsule by mouth daily.   Misc Natural Products (BLACK CHERRY CONCENTRATE PO) Take 1,000 mg daily by mouth.   nitroGLYCERIN (NITROSTAT) 0.4 MG SL tablet Place 1 tablet (0.4 mg total) under the tongue every 5 (five) minutes as needed for chest pain.   Omega-3 Fatty Acids (FISH OIL) 1000 MG CAPS Take 1,000 mg by mouth daily.    rosuvastatin (CRESTOR) 20 MG tablet Take 1 tablet (20 mg total) by mouth daily.   sildenafil (REVATIO) 20 MG tablet TAKE 1 TO 2 TABLETS BY MOUTH EVERY DAY AS NEEDED   telmisartan (MICARDIS) 40 MG tablet Take 1 tablet (40 mg total) by mouth daily.   [DISCONTINUED] chlorpheniramine (CHLOR-TRIMETON) 4 MG tablet Take 4 mg by mouth 2 (two) times daily as needed for allergies. (Patient not taking: Reported on 01/07/2023)   [DISCONTINUED] dextromethorphan-guaiFENesin (MUCINEX DM) 30-600 MG 12hr tablet Take 1 tablet by mouth 2 (two) times daily. (Patient not taking: Reported on 01/07/2023)   No facility-administered encounter medications on file as of 02/18/2023.     Lab Results  Component Value Date   WBC 6.0 08/09/2022   HGB 14.4 08/09/2022   HCT 42.7 08/09/2022   PLT 280.0 08/09/2022   GLUCOSE 113 (H) 02/13/2023   CHOL 104 02/13/2023   TRIG 73.0 02/13/2023   HDL 46.10 02/13/2023   LDLCALC 43 02/13/2023   ALT 25 02/13/2023   AST 25 02/13/2023   NA 134 (L) 02/13/2023   K 4.3  02/13/2023   CL 101 02/13/2023   CREATININE 0.98 02/13/2023   BUN 12 02/13/2023   CO2 25 02/13/2023   TSH 2.46 02/13/2023   PSA 1.27 11/28/2020   INR 1.0 09/11/2020   HGBA1C 6.5 02/13/2023    DG Chest 2 View  Result Date: 03/22/2022 CLINICAL DATA:  Persistent  cough and congestion EXAM: CHEST - 2 VIEW COMPARISON:  Chest x-ray dated 09/11/2020 FINDINGS: Heart size and mediastinal contours are within normal limits. Lungs are hyperexpanded. Chronic bronchitic changes noted centrally. No pulmonary nodule or mass is seen. No confluent airspace opacity to suggest a developing pneumonia. No pleural effusion or pneumothorax is seen. No acute-appearing osseous abnormality. IMPRESSION: 1. No active cardiopulmonary disease. No evidence of pneumonia or pulmonary edema. 2. Hyperexpanded lungs indicating COPD. Electronically Signed   By: Bary Richard M.D.   On: 03/22/2022 08:31       Assessment & Plan:  Hyperlipidemia, unspecified hyperlipidemia type Assessment & Plan: On crestor and zetia.  Low cholesterol diet and exercise.  Follow lipid panel and liver function tests.   Orders: -     Lipid panel; Future -     Hepatic function panel; Future -     Basic metabolic panel; Future  Diabetes mellitus type 2 with complications (HCC) Assessment & Plan: Low carb diet and exercise.  Recent A1c 6.5.  follow met b and A1c. On no medication.   Orders: -     Hemoglobin A1c; Future -     Microalbumin / creatinine urine ratio; Future  Gastroesophageal reflux disease without esophagitis Assessment & Plan: Controlled on prilosec.     Asthma-COPD overlap syndrome Assessment & Plan: 09/2022 - Dr Jayme Cloud - continue trelegy and prn albuterol. Breathing stable.    Bilateral carotid artery disease, unspecified type [facility]) Assessment & Plan: [facility] 12/2022 - Duplex ultrasound shows <40% stenosis bilaterally. F/u 12 months.    Pulmonary emphysema, unspecified emphysema type (HCC) Assessment & Plan:   Continues trelegy.  Has albuterol to take prn.  Cough improved.  Breathing stable.    Subclavian artery stenosis, left [facility]) Assessment & Plan: Saw [facility] 01/24/23 - Carotid Duplex done today shows <40%.  No change compared to last study in 2023. Vertebral arteries were  well-visualized today with no evidence of stenosis and normal flow hemodynamics in the bilateral subclavian arteries. Recommended continued antiplatelet therapy and f/u 12 months.    History of colon polyps Assessment & Plan: Colonoscopy 12/12/21:   RECTAL POLYP; HOT SNARE:  - TUBULAR ADENOMA WITH MUCOSAL PROLAPSE TYPE CHANGES.  - CAUTERIZED POLYP BASE FREE OF DYSPLASIA.  - NEGATIVE FOR HIGH-GRADE DYSPLASIA AND MALIGNANCY.   COLON POLYPS X2, ASCENDING; COLD BIOPSY AND COLD SNARE:  - MULTIPLE FRAGMENTS OF TUBULAR ADENOMAS.  - NEGATIVE FOR HIGH-GRADE DYSPLASIA AND MALIGNANCY.   Hematuria, unspecified type Assessment & Plan: Evaluated by Dr Lonna Cobb - 09/2021 - recommended CT urogram and cystoscopy.  Cysto (10/30/21) - No bladder mucosal abnormalities. Hematuria most likely secondary to BPH. Started finasteride 5 mg daily. Follow-up 1 year or earlier for recurrent hematuria. Had f/u with urology - 10/2022 - off plavix.  No recurring episodes of hematuria.  No changes made.  Recommended f/u in one year.    Bronchiectasis without complication [facility]) Assessment & Plan: Established with pulmonary.  Continue trelegy.   Breathing overall improved on current regimen.    Benign essential HTN Assessment & Plan: Continue micardis 40mg  q day.  Follow pressures.  Follow metabolic panel.  Dale Hamilton, MD

## 2023-02-18 ENCOUNTER — Encounter: Payer: Self-pay | Admitting: Internal Medicine

## 2023-02-18 ENCOUNTER — Ambulatory Visit: Payer: PPO | Admitting: Internal Medicine

## 2023-02-18 VITALS — BP 122/68 | HR 74 | Ht 66.0 in | Wt 164.8 lb

## 2023-02-18 DIAGNOSIS — J4489 Other specified chronic obstructive pulmonary disease: Secondary | ICD-10-CM

## 2023-02-18 DIAGNOSIS — I771 Stricture of artery: Secondary | ICD-10-CM | POA: Diagnosis not present

## 2023-02-18 DIAGNOSIS — E118 Type 2 diabetes mellitus with unspecified complications: Secondary | ICD-10-CM

## 2023-02-18 DIAGNOSIS — J439 Emphysema, unspecified: Secondary | ICD-10-CM

## 2023-02-18 DIAGNOSIS — K219 Gastro-esophageal reflux disease without esophagitis: Secondary | ICD-10-CM

## 2023-02-18 DIAGNOSIS — E785 Hyperlipidemia, unspecified: Secondary | ICD-10-CM

## 2023-02-18 DIAGNOSIS — I779 Disorder of arteries and arterioles, unspecified: Secondary | ICD-10-CM

## 2023-02-18 DIAGNOSIS — I10 Essential (primary) hypertension: Secondary | ICD-10-CM | POA: Diagnosis not present

## 2023-02-18 DIAGNOSIS — Z86010 Personal history of colonic polyps: Secondary | ICD-10-CM | POA: Diagnosis not present

## 2023-02-18 DIAGNOSIS — R319 Hematuria, unspecified: Secondary | ICD-10-CM

## 2023-02-18 DIAGNOSIS — J479 Bronchiectasis, uncomplicated: Secondary | ICD-10-CM

## 2023-02-23 ENCOUNTER — Encounter: Payer: Self-pay | Admitting: Internal Medicine

## 2023-02-23 NOTE — Assessment & Plan Note (Signed)
09/2022 - Dr Jayme Cloud - continue trelegy and prn albuterol. Breathing stable.

## 2023-02-23 NOTE — Assessment & Plan Note (Signed)
Low carb diet and exercise.  Recent A1c 6.5.  follow met b and A1c. On no medication.

## 2023-02-23 NOTE — Assessment & Plan Note (Addendum)
[facility] 12/2022 - Duplex ultrasound shows <40% stenosis bilaterally. F/u 12 months.

## 2023-02-23 NOTE — Assessment & Plan Note (Signed)
Continues trelegy.  Has albuterol to take prn.  Cough improved.  Breathing stable.

## 2023-02-23 NOTE — Assessment & Plan Note (Signed)
Colonoscopy 12/12/21:   RECTAL POLYP; HOT SNARE:  - TUBULAR ADENOMA WITH MUCOSAL PROLAPSE TYPE CHANGES.  - CAUTERIZED POLYP BASE FREE OF DYSPLASIA.  - NEGATIVE FOR HIGH-GRADE DYSPLASIA AND MALIGNANCY.   COLON POLYPS X2, ASCENDING; COLD BIOPSY AND COLD SNARE:  - MULTIPLE FRAGMENTS OF TUBULAR ADENOMAS.  - NEGATIVE FOR HIGH-GRADE DYSPLASIA AND MALIGNANCY.

## 2023-02-23 NOTE — Assessment & Plan Note (Signed)
On crestor and zetia.  Low cholesterol diet and exercise.  Follow lipid panel and liver function tests.

## 2023-02-23 NOTE — Assessment & Plan Note (Signed)
Saw [facility] 01/24/23 - Carotid Duplex done today shows <40%.  No change compared to last study in 2023. Vertebral arteries were well-visualized today with no evidence of stenosis and normal flow hemodynamics in the bilateral subclavian arteries. Recommended continued antiplatelet therapy and f/u 12 months.

## 2023-02-23 NOTE — Assessment & Plan Note (Signed)
Controlled on prilosec.   

## 2023-02-23 NOTE — Assessment & Plan Note (Signed)
Continue micardis 40mg  q day.  Follow pressures.  Follow metabolic panel.

## 2023-02-23 NOTE — Assessment & Plan Note (Signed)
Established with pulmonary.  Continue trelegy.   Breathing overall improved on current regimen.

## 2023-02-23 NOTE — Assessment & Plan Note (Signed)
Evaluated by Dr Lonna Cobb - 09/2021 - recommended CT urogram and cystoscopy.  Cysto (10/30/21) - No bladder mucosal abnormalities. Hematuria most likely secondary to BPH. Started finasteride 5 mg daily. Follow-up 1 year or earlier for recurrent hematuria. Had f/u with urology - 10/2022 - off plavix.  No recurring episodes of hematuria.  No changes made.  Recommended f/u in one year.

## 2023-03-04 ENCOUNTER — Encounter: Payer: Self-pay | Admitting: Pulmonary Disease

## 2023-03-04 ENCOUNTER — Ambulatory Visit: Payer: PPO | Admitting: Pulmonary Disease

## 2023-03-04 ENCOUNTER — Telehealth: Payer: Self-pay

## 2023-03-04 VITALS — BP 118/60 | HR 67 | Temp 97.4°F | Ht 66.0 in | Wt 164.6 lb

## 2023-03-04 DIAGNOSIS — Z87891 Personal history of nicotine dependence: Secondary | ICD-10-CM

## 2023-03-04 DIAGNOSIS — J479 Bronchiectasis, uncomplicated: Secondary | ICD-10-CM

## 2023-03-04 DIAGNOSIS — J4489 Other specified chronic obstructive pulmonary disease: Secondary | ICD-10-CM | POA: Diagnosis not present

## 2023-03-04 NOTE — Progress Notes (Signed)
Subjective:    Patient ID: James Peck, male    DOB: [birth year], 82 y.o.   MRN: [id number]  Patient Care Team: Dale [place], MD as PCP - General (Internal Medicine) Salena Saner, MD as Consulting Physician (Pulmonary Disease)  Chief Complaint  Patient presents with   Follow-up    DOE.No wheezing. Some cough with green to yellow sputum in the mornings.     HPI James Peck is an 82 year old former smoker who follows up on the issue of cough and shortness of breath. He has known asthma COPD overlap syndrome and bronchiectasis. His last scheduled visit here was 25 October 2022 and at that time was found to be well compensated on his regimen of Trelegy Ellipta and as needed albuterol. This is a scheduled follow-up visit. He is still actively working.  He endorses having dyspnea only with heavy exertion like carrying heavy objects but for the most part activities of daily living do not trigger dyspnea he feels that this is much better since he started Trelegy. He has not had any fevers, chills or sweats. No cough or sputum production. Sputum production has decreased significantly since he started Trelegy and continues to do well with this.  Rare use of albuterol as needed (once per month at most).  No hemoptysis. He does not endorse any other new symptoms. Overall he feels well and looks well.   His only concern is that he is about to hit "the donut hole" with Medicare for payment of his Trelegy.  He just got a 73-month supply.  DATA 04/18/2020 PFTs: FEV1 1.40 L or 57% predicted, FVC 2.48 L or 71% predicted, FEV1/FVC 57%.  There is significant bronchodilator response with 17% change in FEV1 postbronchodilator.  Moderate air trapping, mild diffusion capacity impairment, consistent with COPD/emphysema with reversible airways component. 09/12/2020 2D echo: LVEF 60 to 65%, no regional wall motion normalities.  Grade I DD. 09/12/2020 left heart cath:Insignificant coronary artery disease with patent stent  proximal/mid RCA, 50% stenosis mid/distal LAD with muscle bridge (Paraschos). 12/27/2020 CT chest: Emphysema, lower lobe posterior bronchiectasis, not new volume of retained secretions in bronchus.  No lesions of concern.  Calcified atherosclerosis. 03/20/2022 chest x-ray PA and lateral: No active cardiopulmonary disease, hyperinflation consistent with COPD.  Review of Systems A 10 point review of systems was performed and it is as noted above otherwise negative.   Patient Active Problem List   Diagnosis Date Noted   Diabetes mellitus type 2 with complications (HCC) 01/07/2023   BPH (benign prostatic hyperplasia) 11/15/2022   Asthma-COPD overlap syndrome 01/31/2022   Bronchiectasis without complication (HCC) 01/31/2022   Hematuria 10/01/2021   History of colon polyps 08/06/2021   Hyponatremia 04/09/2021   Subclavian artery stenosis, left (HCC) 02/04/2021   Carotid artery disease (HCC) 02/04/2021   Coronary artery disease    Chest pain    NSTEMI (non-ST elevated myocardial infarction) (HCC)    COPD exacerbation (HCC)    Allergic rhinitis 04/12/2020   Emphysema, unspecified (HCC) 04/12/2020   Former smoker 04/12/2020   Lung nodules 03/27/2020   Renal lesion 03/27/2020   Abnormal abdominal CT scan 02/16/2020   Right rotator cuff tear 08/19/2019   Cough 06/30/2019   Healthcare maintenance 08/28/2018   Hyperglycemia 12/22/2017   Degenerative arthritis of left knee 06/04/2017   Degenerative tear of meniscus of left knee 02/14/2015   Acid reflux 01/27/2015   Adenomatous colon polyp 01/27/2015   Allergic state 01/27/2015   Arteriosclerosis of coronary artery 01/27/2015  Lumbar radiculopathy 01/27/2015   CAD in native artery 01/07/2014   Benign essential HTN 10/21/2013   HLD (hyperlipidemia) 10/21/2013    Social History   Tobacco Use   Smoking status: Former    Current packs/day: 0.00    Average packs/day: 1 pack/day for 40.0 years (40.0 ttl pk-yrs)    Types: Cigarettes     Start date: 70    Quit date: 2000    Years since quitting: 24.6   Smokeless tobacco: Never  Substance Use Topics   Alcohol use: No    Allergies  Allergen Reactions   Ace Inhibitors Cough   Enalapril Cough    Current Meds  Medication Sig   albuterol (VENTOLIN HFA) 108 (90 Base) MCG/ACT inhaler Inhale 1 puff into the lungs every 6 (six) hours as needed for wheezing or shortness of breath.   aspirin EC 81 MG tablet Take 81 mg by mouth daily.    calcium carbonate (OS-CAL - DOSED IN MG OF ELEMENTAL CALCIUM) 1250 (500 CA) MG tablet Take 1 tablet by mouth daily.    cetirizine (ZYRTEC) 10 MG tablet TAKE 1 TABLET BY MOUTH DAILY   CINNAMON PO Take 1,200 mg by mouth 2 times daily at 12 noon and 4 pm.   ezetimibe (ZETIA) 10 MG tablet Take 1 tablet (10 mg total) by mouth daily.   famotidine (PEPCID) 20 MG tablet TAKE 1 TABLET BY MOUTH DAILY   finasteride (PROSCAR) 5 MG tablet Take 1 tablet (5 mg total) by mouth daily.   fluticasone (FLONASE) 50 MCG/ACT nasal spray TAKE 2 SPRAYS INTO BOTH NOSTRILS DAILY   Fluticasone-Umeclidin-Vilant (TRELEGY ELLIPTA) 100-62.5-25 MCG/ACT AEPB Inhale 1 puff into the lungs daily.   GLUCOSAMINE-CHONDROITIN PO Take 1 capsule by mouth daily.   Misc Natural Products (BLACK CHERRY CONCENTRATE PO) Take 1,000 mg daily by mouth.   nitroGLYCERIN (NITROSTAT) 0.4 MG SL tablet Place 1 tablet (0.4 mg total) under the tongue every 5 (five) minutes as needed for chest pain.   Omega-3 Fatty Acids (FISH OIL) 1000 MG CAPS Take 1,000 mg by mouth daily.    rosuvastatin (CRESTOR) 20 MG tablet Take 1 tablet (20 mg total) by mouth daily.   sildenafil (REVATIO) 20 MG tablet TAKE 1 TO 2 TABLETS BY MOUTH EVERY DAY AS NEEDED   telmisartan (MICARDIS) 40 MG tablet Take 1 tablet (40 mg total) by mouth daily.    Immunization History  Administered Date(s) Administered   Fluad Quad(high Dose 65+) 04/09/2019, 04/04/2021   Influenza Split 05/11/2014   Influenza, High Dose Seasonal PF  04/23/2017, 04/29/2018, 04/09/2019, 05/05/2020, 05/10/2022   Influenza-Unspecified 04/25/2015, 04/23/2017   PFIZER(Purple Top)SARS-COV-2 Vaccination 08/28/2019, 09/18/2019   Tdap 03/11/2013      Objective:     BP 118/60 (BP Location: Left Arm, Cuff Size: Normal)   Pulse 67   Temp (!) 97.4 F (36.3 C)   Ht 5\' 6"  (1.676 m)   Wt 164 lb 9.6 oz (74.7 kg)   SpO2 95%   BMI 26.57 kg/m   SpO2: 95 % O2 Device: None (Room air)  GENERAL: Well-developed, well-nourished elderly gentleman, looks younger than stated age, in no acute distress, fully ambulatory.  No conversational dyspnea. HEAD: Normocephalic, atraumatic.  EYES: Pupils equal, round, reactive to light.  No scleral icterus.  MOUTH: Oral mucosa moist, intact dentition. NECK: Supple. No thyromegaly. Trachea midline. No JVD.  No adenopathy. PULMONARY: Good air entry bilaterally.  Coarse, otherwise, no adventitious sounds. CARDIOVASCULAR: S1 and S2. Regular rate and rhythm.  No rubs,  murmurs or gallops heard. ABDOMEN: Benign. MUSCULOSKELETAL: No joint deformity, no clubbing, no edema.  NEUROLOGIC: No focal deficit, no gait disturbance, speech is fluent. SKIN: Intact,warm,dry. PSYCH: Mood and behavior normal.    Assessment & Plan:     ICD-10-CM   1. Asthma-COPD overlap syndrome  J44.89    Continue Trelegy Ellipta 100 Continue as needed albuterol    2. Bronchiectasis without complication (HCC)  J47.9    No evidence of flare Well compensated    3. Former smoker  Z87.891    No evidence of relapse     We provided the patient with [employer] assistance program forms.  We will see the patient in follow-up in 3 to 4 months time he is to contact us prior to that time should any new difficulties arise.     Gailen Shelter, MD Advanced Bronchoscopy PCCM [facility]    *This note was dictated using voice recognition software/Dragon.  Despite best efforts to proofread, errors can occur which can change the  meaning. Any transcriptional errors that result from this process are unintentional and may not be fully corrected at the time of dictation.

## 2023-03-04 NOTE — Patient Instructions (Signed)
Your lungs sounded good today.  Continue using Trelegy 1 puff daily.  Continue as needed albuterol.  We have provided you forms for [employer] assistance.  We will see you in follow-up in 3 to 4 months time, sooner should any new problems arise.

## 2023-03-04 NOTE — Telephone Encounter (Signed)
Patient was seen in the office today and given [employer] assistance forms to fill out. He will return them to the office once he has completed them.   Holding message to confirm completion.

## 2023-03-08 NOTE — Telephone Encounter (Signed)
I spoke with the patient. He said he has not met the $600 out of pocket. So he did not fill out the form. He will let us know once he does so we can get the form sent in for him.  Nothing further needed.

## 2023-03-08 NOTE — Telephone Encounter (Signed)
ATC patient for update. Unable to leave vm due to mailbox being full.

## 2023-03-11 ENCOUNTER — Other Ambulatory Visit: Payer: Self-pay | Admitting: Internal Medicine

## 2023-03-11 NOTE — Telephone Encounter (Signed)
Refilled slidenafil.  Make sure to remind him that he cannot take this medication and use nitroglycerin.

## 2023-03-20 NOTE — Telephone Encounter (Signed)
Pt was notified. Pt stated he was also notified by pharmacy as well

## 2023-04-08 ENCOUNTER — Other Ambulatory Visit: Payer: Self-pay | Admitting: Cardiovascular Disease

## 2023-04-22 MED ORDER — TRELEGY ELLIPTA 100-62.5-25 MCG/ACT IN AEPB: 1.0000 | INHALATION_SPRAY | Freq: Every day | RESPIRATORY_TRACT | 3 refills | Status: DC

## 2023-04-22 NOTE — Addendum Note (Signed)
Addended by: Bonney Leitz on: 04/22/2023 12:02 PM   Modules accepted: Orders

## 2023-04-22 NOTE — Telephone Encounter (Signed)
Forms have been faxed to [employer].   Nothing further needed.

## 2023-04-22 NOTE — Telephone Encounter (Addendum)
Patient dropped off his [employer] form today. I have printed a prescription for Dr. Jayme Cloud to sign.

## 2023-05-27 ENCOUNTER — Other Ambulatory Visit: Payer: Self-pay | Admitting: Internal Medicine

## 2023-05-31 IMAGING — CT CT CHEST W/O CM
2 of 4 series · 15 of 36 positions shown, 18 images · non-contrast
Comparison: CT Abdomen and Pelvis today reported separately. CT
Abdomen and Pelvis 01/15/2020. Chest radiographs 09/11/2020.

CLINICAL DATA: 80-year-old male with bronchial wall thickening and
distal airway impaction on CT Abdomen and Pelvis last year.
Intermittent congestion and productive cough. Former smoker.

EXAM:
CT CHEST WITHOUT CONTRAST
TECHNIQUE: Multidetector CT imaging of the chest was performed following the
standard protocol without IV contrast.

[Series 2: chest 2.00 · axial · 0.67mm/px · z∈[-1224,-958]mm · 12 of 159 slices shown, 15 images]
[im 13/159  mediastinal]
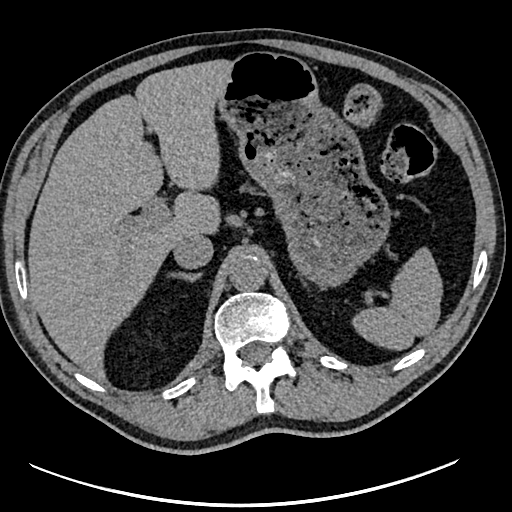
[im 13/159  lung]
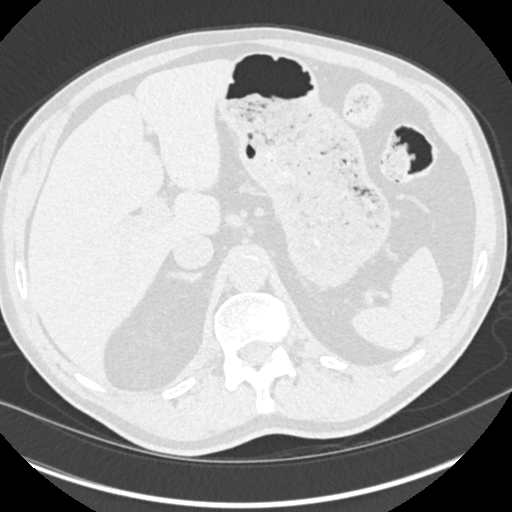
[im 25/159  lung]
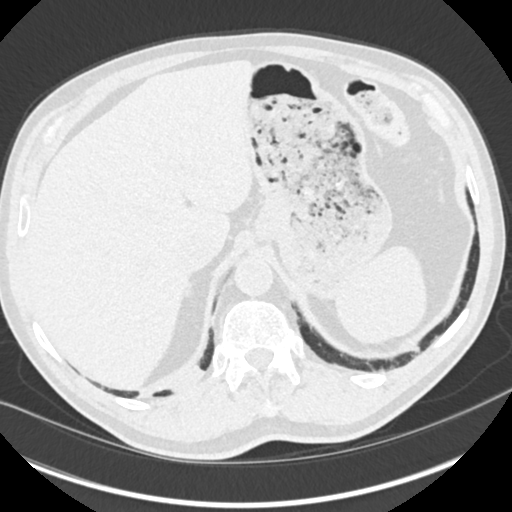
[im 37/159  lung]
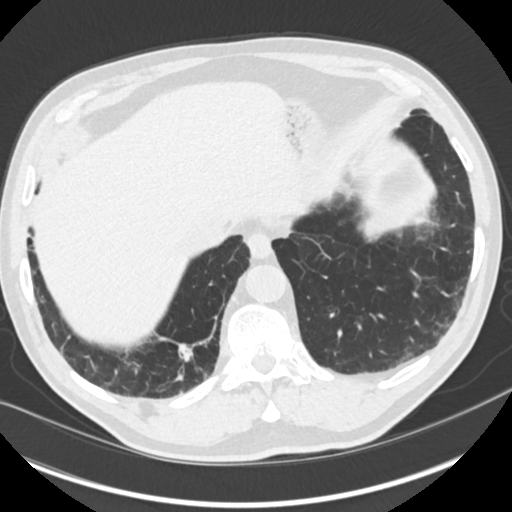
[im 49/159  lung]
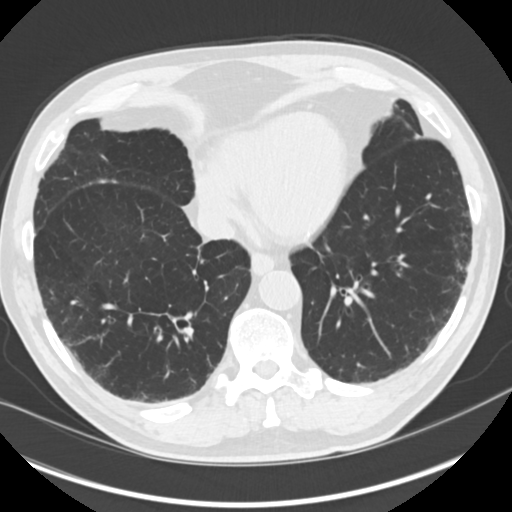
[im 61/159  mediastinal]
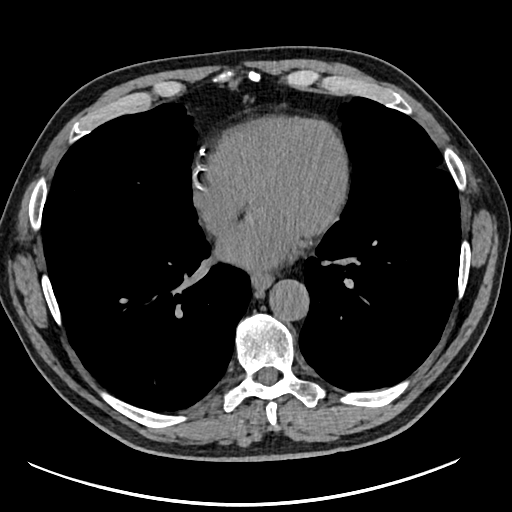
[im 61/159  lung]
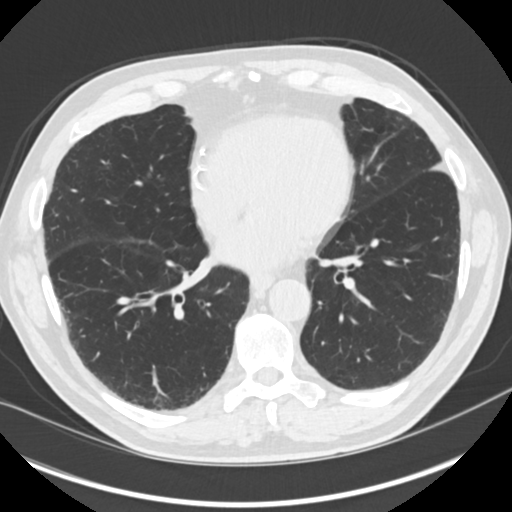
[im 73/159  lung]
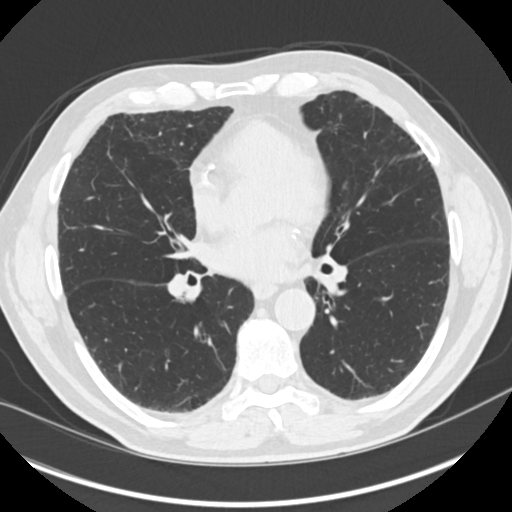
[im 86/159  lung]
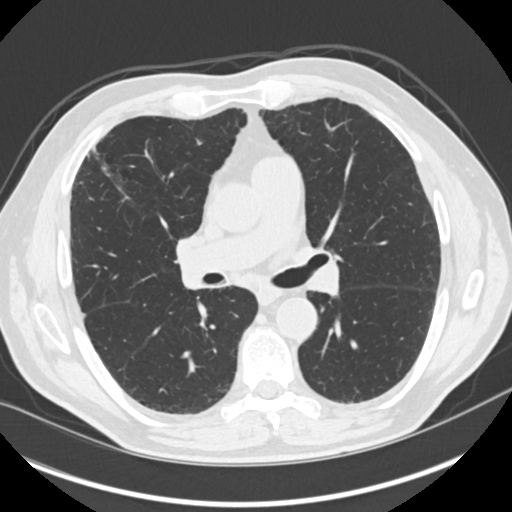
[im 98/159  lung]
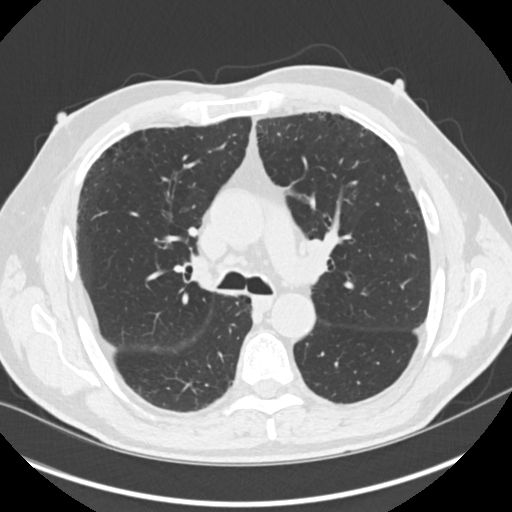
[im 110/159  mediastinal]
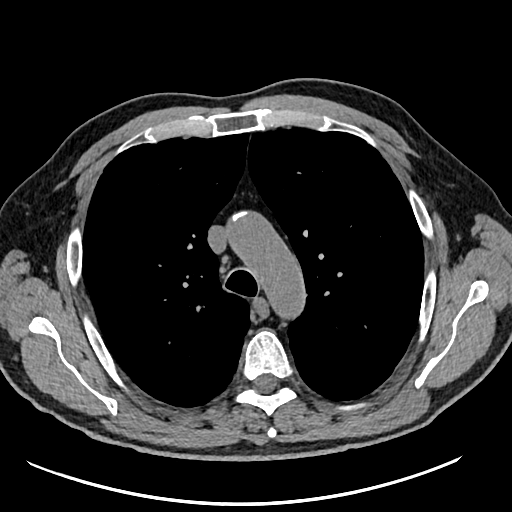
[im 110/159  lung]
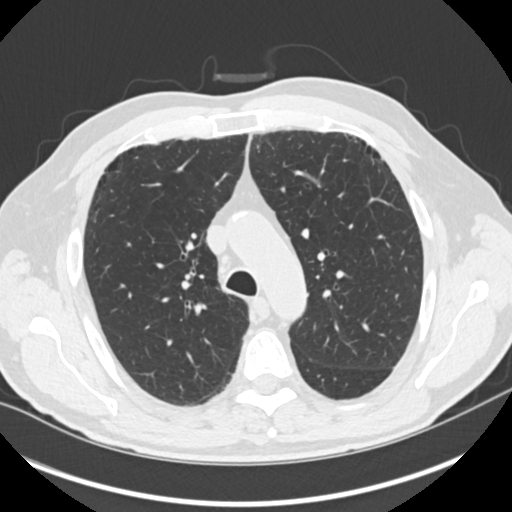
[im 122/159  lung]
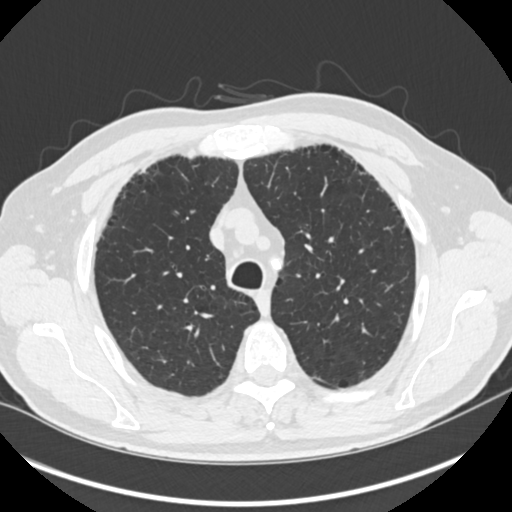
[im 134/159  lung]
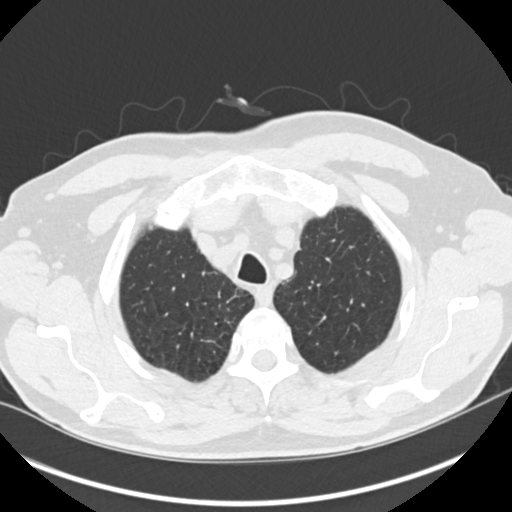
[im 146/159  lung]
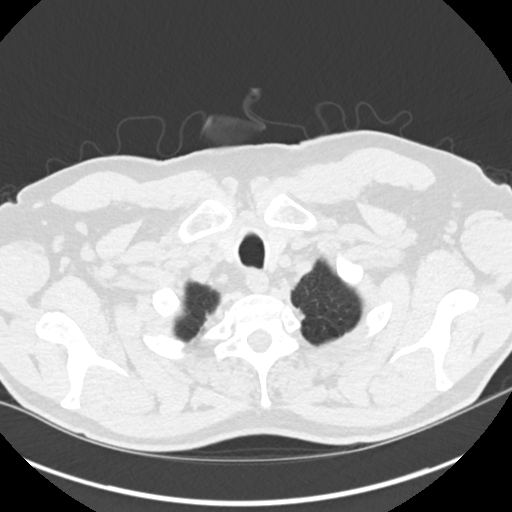

[Series 5: coronals chest 2.00 cor · coronal · 0.62mm/px · 3 of 142 slices shown]
[im 29/142  lung]
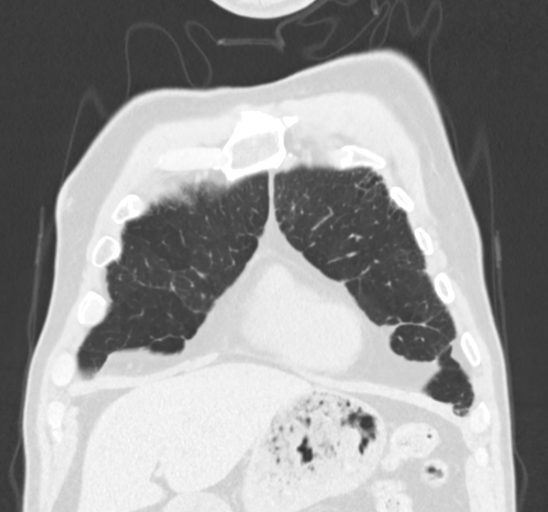
[im 57/142  lung]
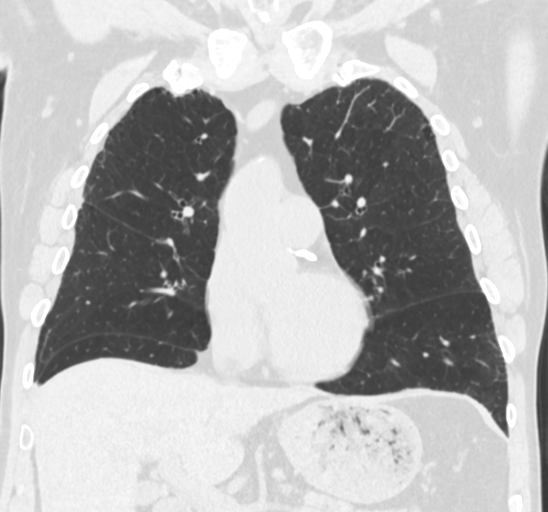
[im 85/142  lung]
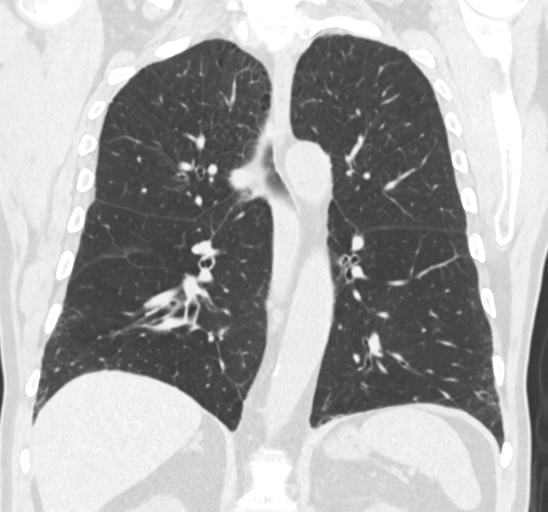

[15 of 36 positions shown; findings below may reference images not displayed]

FINDINGS: Cardiovascular: Bulky calcified plaque throughout the proximal left
subclavian artery which is likely occluded (series 2, image 37).
Calcified aortic atherosclerosis. Series 2, image 82) severe
calcified coronary artery atherosclerosis (. No cardiomegaly or
pericardial effusion. Vascular patency is not evaluated in the
absence of IV contrast.

Mediastinum/Nodes: Negative.  No lymphadenopathy.

Lungs/Pleura: Widespread centrilobular emphysema. Upper lobe
subpleural scarring and early honeycombing in some areas (series 3,
image 42).

Retained secretions in the right mainstem bronchus. The trachea and
carina are otherwise patent. The other major airways are patent. In
the posterior basal segment of the right lower lobe there is chronic
bronchiectasis and bronchial wall thickening with an unchanged
somewhat wedge-shaped area of distal airspace disease in the
costophrenic angle (series 3, image 126).

No superimposed pleural effusion. Mild contralateral left
costophrenic angle scarring. No suspicious or new pulmonary opacity.

Upper Abdomen: Abdomen is reported separately today.

Musculoskeletal: No acute osseous abnormality identified.
IMPRESSION: 1. Chronic lung disease with Emphysema (1L4FB-MOL.1). Stable right
lower lobe posterior basal segment Bronchiectasis and scarring since
last year. Small volume retained secretions in the right mainstem
bronchus. But no new or progressed pulmonary abnormality.

2. Bulky calcified atherosclerosis, including probable Chronic
Occlusion of the proximal Left Subclavian Artery, which would
predispose to Subclavian Steal Syndrome. Advanced calcified coronary
artery disease, Aortic atherosclerosis ([id number]-[id number].R).

## 2023-05-31 IMAGING — CT CT ABDOMEN W/O CM
2 of 4 series · 15 of 46 positions shown, 17 images · non-contrast
Comparison: Comparison made with prior CT of the abdomen and pelvis
from January 15, 2020 and with CT of the chest also acquired on December 27, 2020.

CLINICAL DATA: Follow-up 11 mm renal lesion, CT performed on January 15, 2020.

EXAM:
CT ABDOMEN WITHOUT CONTRAST
TECHNIQUE: Multidetector CT imaging of the abdomen was performed following the
standard protocol without IV contrast.

[Series 2: axials routine abdomen pelvis without 5.00 · axial · non-contrast · 0.70mm/px · z∈[-1372,-1147]mm · 12 of 51 slices shown, 14 images]
[im 3/51  soft-tissue]
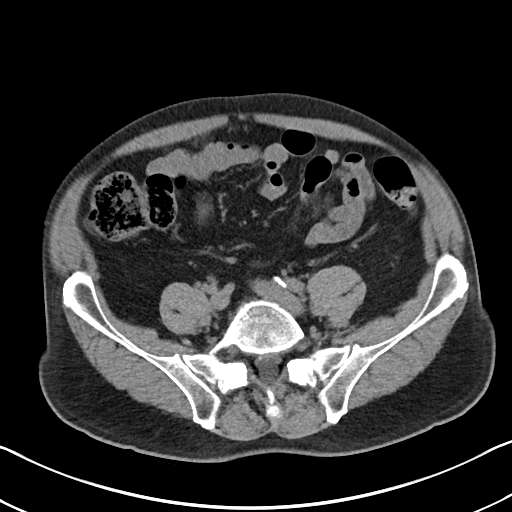
[im 3/51  bone]
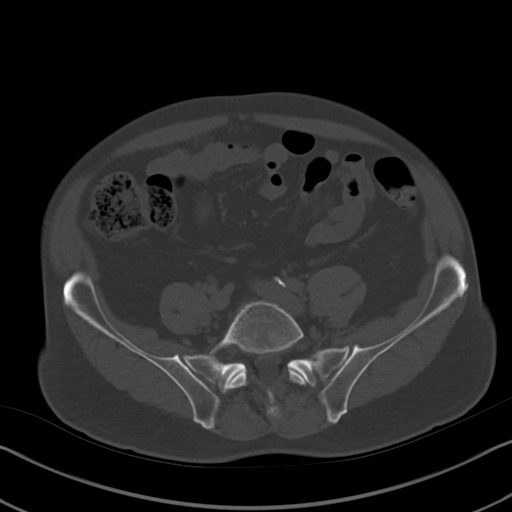
[im 8/51  soft-tissue]
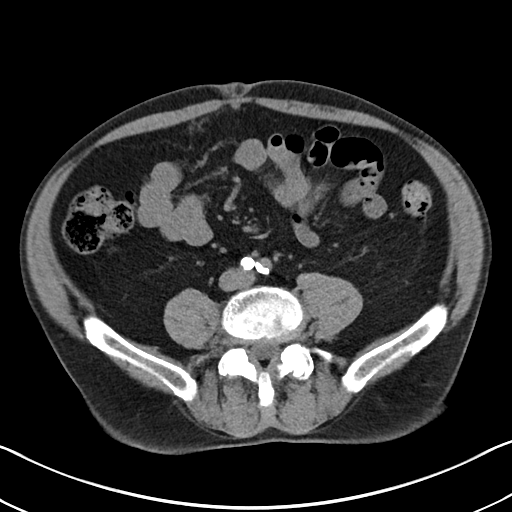
[im 11/51  soft-tissue]
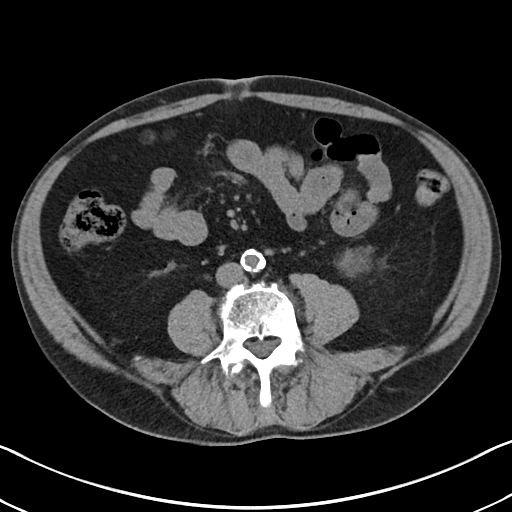
[im 16/51  soft-tissue]
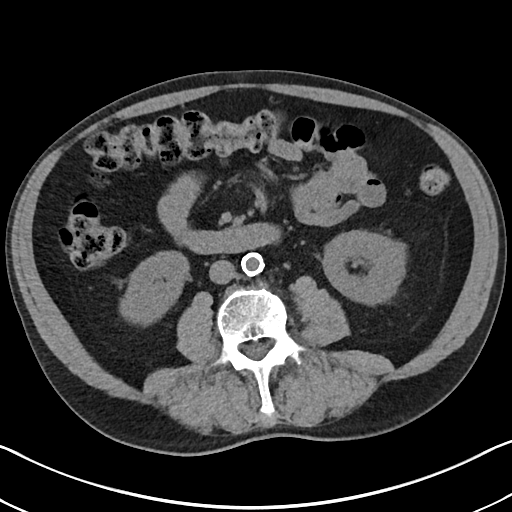
[im 21/51  soft-tissue]
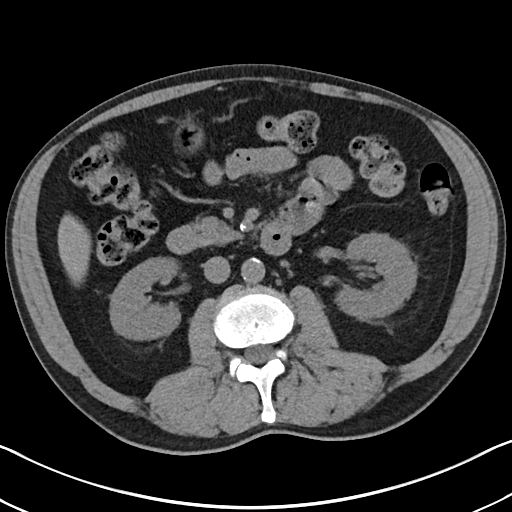
[im 23/51  soft-tissue]
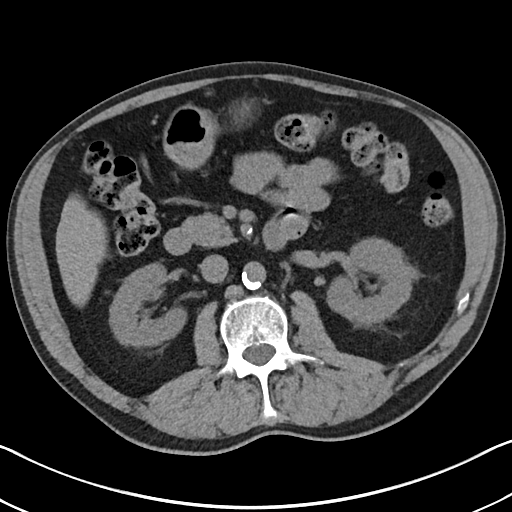
[im 28/51  soft-tissue]
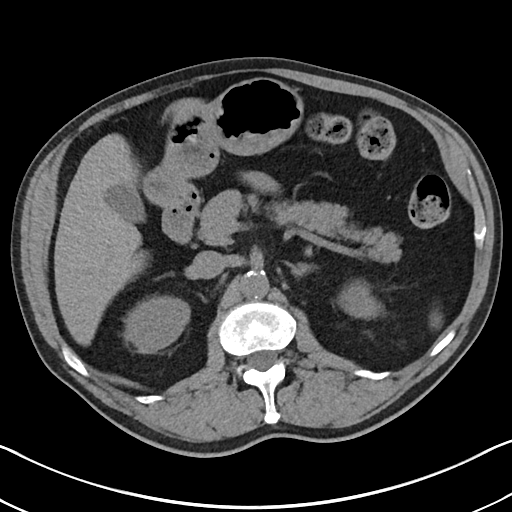
[im 31/51  soft-tissue]
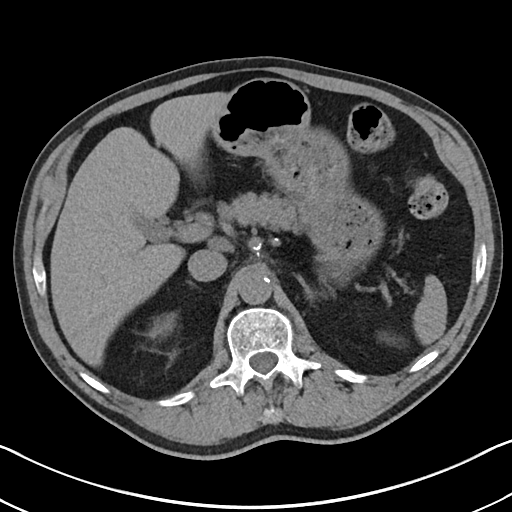
[im 36/51  soft-tissue]
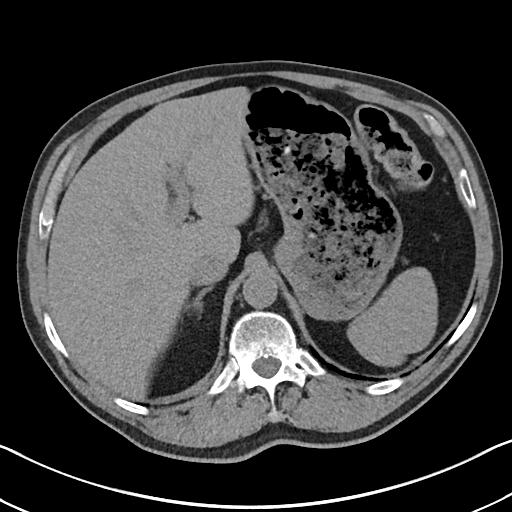
[im 36/51  bone]
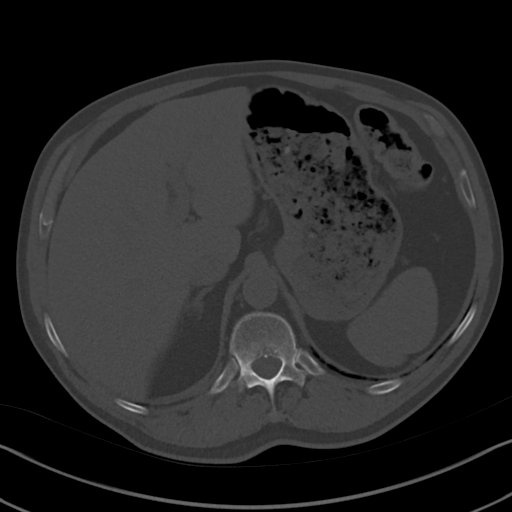
[im 41/51  soft-tissue]
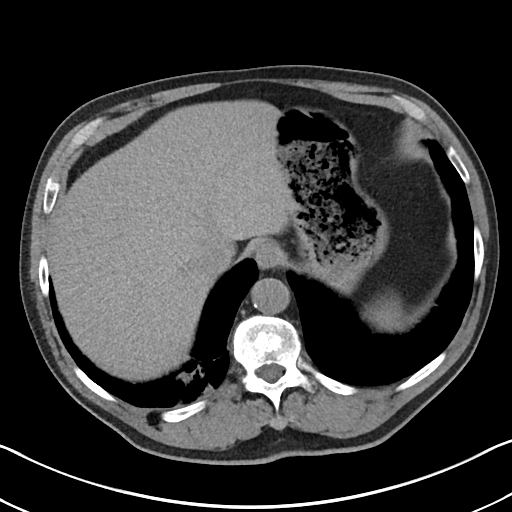
[im 43/51  soft-tissue]
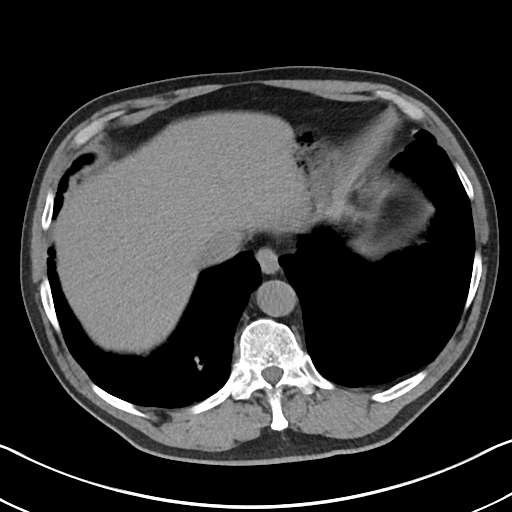
[im 48/51  soft-tissue]
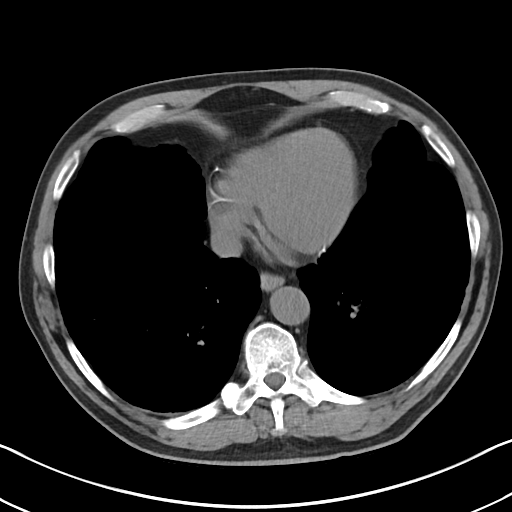

[Series 4: coronals routine abdomen pelvis without 2.00 cor · coronal · non-contrast · 0.50mm/px · 3 of 149 slices shown]
[im 50/149  soft-tissue]
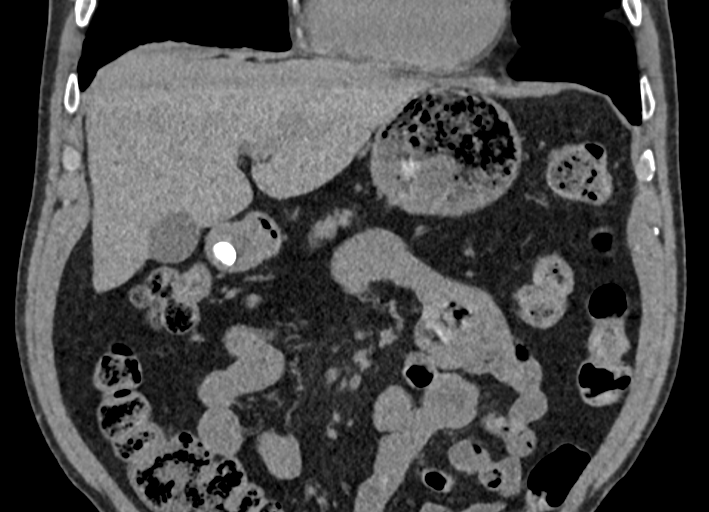
[im 66/149  soft-tissue]
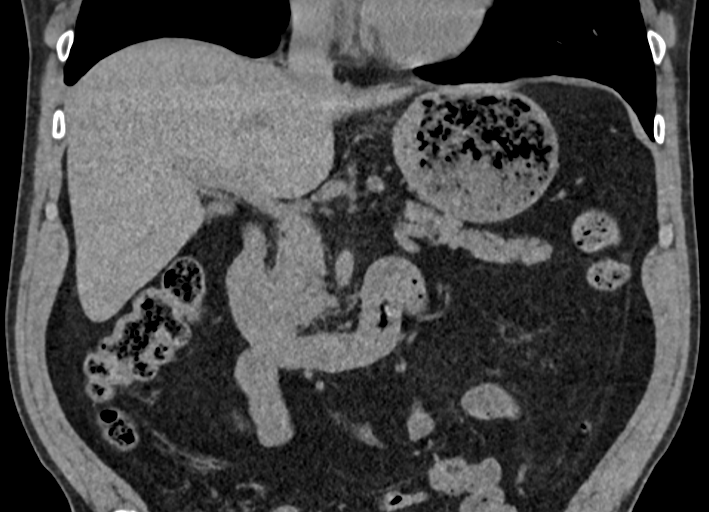
[im 83/149  soft-tissue]
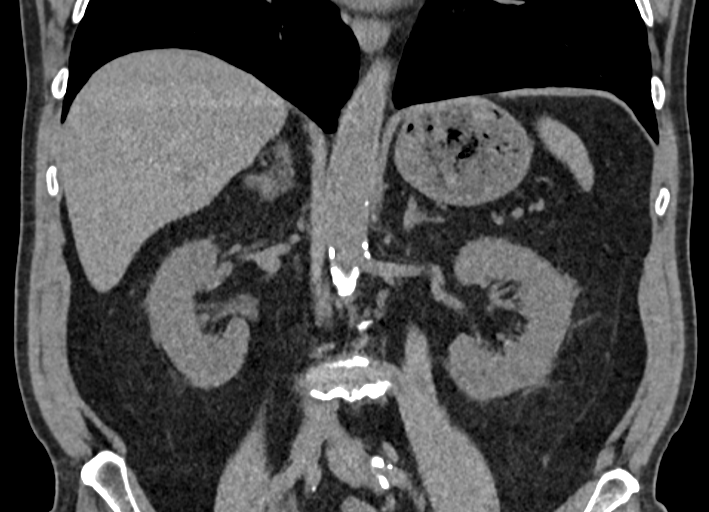

[15 of 46 positions shown; findings below may reference images not displayed]

FINDINGS: Lower chest: Pulmonary emphysema. Signs of pleural and parenchymal
scarring at the RIGHT lung base. Peripheral consolidated area
measuring 1.6 x 1.2 cm is unchanged dating back to Sunday December, 2019.
Linear areas and mild bronchiectasis elsewhere at the RIGHT lung
base. Areas of mucoid impaction and bronchial subtending the
dependent RIGHT chest as before.

Hepatobiliary: Liver with smooth contour. No pericholecystic
stranding.

Pancreas: No signs of peripancreatic stranding or gross ductal
dilation.

Spleen: Normal spleen.

Adrenals/Urinary Tract: Adrenal glands are normal.

The area of low attenuation in the interpolar RIGHT kidney with
density in the 24 Hounsfield unit range previously shown to have a
density on a contrasted study of less than 30 Hounsfield units, not
as well seen given slightly dense features as on previous imaging.
No nephrolithiasis. No hydronephrosis. Mild perinephric stranding is
a stable finding.

Stomach/Bowel: No acute gastrointestinal process to the extent
evaluated on abdominal CT.

Vascular/Lymphatic: Aortic atherosclerosis. No aneurysmal dilation
of the abdominal aorta. No abdominal lymphadenopathy.

Other: No ascites.

Musculoskeletal: No acute musculoskeletal process. Spinal
degenerative changes.
IMPRESSION: 1. Area of low attenuation in the upper-interpolar RIGHT kidney,
anterior parenchyma seen on the prior study with density values less
than 30 Hounsfield units on venous phase, under new Bosniak criteria
could be classified as Bosniak category II compatible with mildly
proteinaceous or hemorrhagic cyst. This previously measured
approximately 1 cm but is not well seen on the current study.
2. Area of presumed post infectious scarring not changed over nearly
1 year interval since the study of 6462. Persistent mucoid impaction
and peripheral consolidation.
3. Aortic atherosclerosis.

## 2023-06-03 ENCOUNTER — Telehealth: Payer: PPO | Admitting: Primary Care

## 2023-06-03 NOTE — Progress Notes (Unsigned)
Virtual Visit via Video Note  I connected with James Peck on 06/03/23 at  9:30 AM EST by a video enabled telemedicine application and verified that I am speaking with the correct person using two identifiers.  Location: Patient: Home Provider: Office    I discussed the limitations of evaluation and management by telemedicine and the availability of in person appointments. The patient expressed understanding and agreed to proceed.  History of Present Illness: 82 year old male, former smoker quit in 2000.  Past medical history significant for hypertension, coronary artery disease, NSTEMI, COPD-asthma overlap, emphysema, bronchiectasis.  Patient of Dr. Jayme Cloud.  Previous [facility] pulmonary encounter:  James Peck is an 82 year old former smoker who follows up on the issue of cough and shortness of breath. He has known asthma COPD overlap syndrome and bronchiectasis. His last scheduled visit here was 25 October 2022 and at that time was found to be well compensated on his regimen of Trelegy Ellipta and as needed albuterol. This is a scheduled follow-up visit. He is still actively working.  He endorses having dyspnea only with heavy exertion like carrying heavy objects but for the most part activities of daily living do not trigger dyspnea he feels that this is much better since he started Trelegy. He has not had any fevers, chills or sweats. No cough or sputum production. Sputum production has decreased significantly since he started Trelegy and continues to do well with this.  Rare use of albuterol as needed (once per month at most).  No hemoptysis. He does not endorse any other new symptoms. Overall he feels well and looks well.   His only concern is that he is about to hit "the donut hole" with Medicare for payment of his Trelegy.  He just got a 64-month supply.  DATA  06/04/2023- Interim hx  Patient contacted today for virtual visit/ 3 month follow-up COPD.   Discussed the use of AI scribe software for  clinical note transcription with the patient, who gave verbal consent to proceed.  History of Present Illness   Patient presented for a three-month follow-up COPD. He reports a significant improvement in his condition, with reduced coughing and very little sputum production. The patient attributes this improvement to the use of Trelegy, which he has been taking regularly. However, he expressed concerns about the affordability of the medication due to being in the 'donut hole' of his insurance coverage. He was not approved for patient assistance. He requested a two-week sample to bridge the gap until his coverage resumes.  The patient reported maintaining an active lifestyle, with no need for a rescue inhaler. He did note occasional shortness of breath with exertion, but was able to recover on his own without the use of albuterol. He denied the use of supplemental oxygen.  He confirmed receiving the flu vaccine this year but had not received the RSV vaccine.   04/18/2020 PFTs: FEV1 1.40 L or 57% predicted, FVC 2.48 L or 71% predicted, FEV1/FVC 57%.  There is significant bronchodilator response with 17% change in FEV1 postbronchodilator.  Moderate air trapping, mild diffusion capacity impairment, consistent with COPD/emphysema with reversible airways component. 09/12/2020 2D echo: LVEF 60 to 65%, no regional wall motion normalities.  Grade I DD. 09/12/2020 left heart cath:Insignificant coronary artery disease with patent stent proximal/mid RCA, 50% stenosis mid/distal LAD with muscle bridge ([place]). 12/27/2020 CT chest: Emphysema, lower lobe posterior bronchiectasis, not new volume of retained secretions in bronchus.  No lesions of concern.  Calcified atherosclerosis. 03/20/2022 chest x-ray PA and  lateral: No active cardiopulmonary disease, hyperinflation consistent with COPD.   Observations/Objective:  Patient was unable to connect to video on his end but audio was working    Assessment and  Plan:  1. Asthma-COPD overlap syndrome (HCC)  Chronic Obstructive Pulmonary Disease (COPD) Improved symptoms with Trelegy, including reduced cough and sputum production. No recent exacerbations or need for rescue inhaler. No oxygen use. - We were only able to provide one-week sample of Trelegy to bridge the gap due to current financial constraints (donut hole). He can call back for addition sample in the future -Consider alternative medication options if Trelegy remains unaffordable in the future. -Continue current bronchodilator regimen and monitor for any worsening respiratory symptoms.  RSV Vaccination Patient has not received the RSV vaccine and has a history of RSV infection. -Recommend RSV vaccine, available at the pharmacy. -Provide information about the RSV vaccine in the after-visit summary.  Follow-up Stable COPD control. -Schedule follow-up visit in 4-6 months. -Alert medical team if any respiratory infections, positive COVID test, or worsening respiratory symptoms occur before the next follow-up.   Follow Up Instructions:  Follow-up in 4-6 months with Dr. Jayme Cloud    I discussed the assessment and treatment plan with the patient. The patient was provided an opportunity to ask questions and all were answered. The patient agreed with the plan and demonstrated an understanding of the instructions.   The patient was advised to call back or seek an in-person evaluation if the symptoms worsen or if the condition fails to improve as anticipated.  I provided 24 minutes of non-face-to-face time during this encounter.   Glenford Bayley, NP

## 2023-06-04 ENCOUNTER — Telehealth: Payer: Self-pay

## 2023-06-04 ENCOUNTER — Telehealth: Payer: PPO | Admitting: Primary Care

## 2023-06-04 DIAGNOSIS — J4489 Other specified chronic obstructive pulmonary disease: Secondary | ICD-10-CM

## 2023-06-04 DIAGNOSIS — Z87891 Personal history of nicotine dependence: Secondary | ICD-10-CM | POA: Diagnosis not present

## 2023-06-04 MED ORDER — TRELEGY ELLIPTA 100-62.5-25 MCG/ACT IN AEPB: 1.0000 | INHALATION_SPRAY | Freq: Every day | RESPIRATORY_TRACT | Status: DC

## 2023-06-04 NOTE — Progress Notes (Signed)
Agree with the details of the visit as noted by Elizabeth Walsh, NP.  C. Laura Tonye Tancredi, MD [facility] 

## 2023-06-04 NOTE — Telephone Encounter (Signed)
Received epic secure chat from [place]- leave sample of trelegy 100 up front for pickup.  Sample has been placed up front for pick up. Nothing further needed.

## 2023-06-04 NOTE — Patient Instructions (Signed)
Recommend getting RSV vaccine We will try and provide you Trelegy samples No changes today FU in4-6 months with Dr. Jayme Cloud  Respiratory Syncytial Virus (RSV) Vaccine Injection What is this medication? RESPIRATORY SYNCYTIAL VIRUS VACCINE (reh SPIR uh tor ee sin SISH uhl VY rus vak SEEN) reduces the risk of respiratory syncytial virus (RSV). It does not treat RSV. It is still possible to get RSV after receiving this vaccine, but the symptoms may be less severe or not last as long. It works by helping your immune system learn how to fight off a future infection. This medicine may be used for other purposes; ask your health care provider or pharmacist if you have questions. COMMON BRAND NAME(S): ABRYSVO, AREXVY What should I tell my care team before I take this medication? They need to know if you have any of these conditions: Immune system problems An unusual or allergic reaction to respiratory syncytial virus vaccine, other medications, foods, dyes, or preservatives Pregnant or trying to get pregnant Breastfeeding How should I use this medication? This vaccine is injected into a muscle. It is given by your care team. A copy of Vaccine Information Statements will be given before each vaccination. Be sure to read this information carefully each time. This sheet may change often. A copy of Vaccine Information Statements will be given before each vaccination. Be sure to read this information carefully each time. This sheet may change often. Talk to your care team about the use of this vaccine in children. It is not approved for use in children. Overdosage: If you think you have taken too much of this medicine contact a poison control center or emergency room at once. NOTE: This medicine is only for you. Do not share this medicine with others. What if I miss a dose? This does not apply. What may interact with this medication? Medications that lower your chance of fighting infection This list may  not describe all possible interactions. Give your health care provider a list of all the medicines, herbs, non-prescription drugs, or dietary supplements you use. Also tell them if you smoke, drink alcohol, or use illegal drugs. Some items may interact with your medicine. What should I watch for while using this medication? Visit your care team for regular health checks. Before you receive this vaccine, talk to your care team if you have an acute illness. Vaccines can be given to people with mild acute illness, such as the common cold or diarrhea. Discuss with your care team the risks and benefits of receiving this vaccine during a moderate to severe illness. Your care team may choose to wait to give you the vaccine when you feel better. Report any side effects to your care team or to the Vaccine Adverse Event Reporting System (VAERS) website at https://vaers.LAgents.no. This is only for reporting side effects; VAERs staff do not give medical advice. What side effects may I notice from receiving this medication? Side effects that you should report to your care team as soon as possible: Allergic reactions--skin rash, itching, hives, swelling of the face, lips, tongue, or throat Side effects that usually do not require medical attention (report these to your care team if they continue or are bothersome): Fatigue Feeling faint or lightheaded Headache Joint pain Muscle pain Pain, redness, or irritation at injection site This list may not describe all possible side effects. Call your doctor for medical advice about side effects. You may report side effects to FDA at 1-800-FDA-1088. Where should I keep my medication? This  vaccine is only given by your care team. It will not be stored at home. NOTE: This sheet is a summary. It may not cover all possible information. If you have questions about this medicine, talk to your doctor, pharmacist, or health care provider.  2024 Elsevier/Gold Standard (2022-07-19  00:00:00)

## 2023-06-13 DIAGNOSIS — Z961 Presence of intraocular lens: Secondary | ICD-10-CM | POA: Diagnosis not present

## 2023-06-13 DIAGNOSIS — D3131 Benign neoplasm of right choroid: Secondary | ICD-10-CM | POA: Diagnosis not present

## 2023-06-13 DIAGNOSIS — H43813 Vitreous degeneration, bilateral: Secondary | ICD-10-CM | POA: Diagnosis not present

## 2023-06-13 LAB — HM DIABETES EYE EXAM

## 2023-06-18 ENCOUNTER — Other Ambulatory Visit (INDEPENDENT_AMBULATORY_CARE_PROVIDER_SITE_OTHER): Payer: PPO

## 2023-06-18 DIAGNOSIS — E785 Hyperlipidemia, unspecified: Secondary | ICD-10-CM | POA: Diagnosis not present

## 2023-06-18 DIAGNOSIS — E118 Type 2 diabetes mellitus with unspecified complications: Secondary | ICD-10-CM

## 2023-06-18 LAB — HEPATIC FUNCTION PANEL
ALT: 26 U/L (ref 0–53)
AST: 23 U/L (ref 0–37)
Albumin: 4.4 g/dL (ref 3.5–5.2)
Alkaline Phosphatase: 47 U/L (ref 39–117)
Bilirubin, Direct: 0.2 mg/dL (ref 0.0–0.3)
Total Bilirubin: 0.7 mg/dL (ref 0.2–1.2)
Total Protein: 6.8 g/dL (ref 6.0–8.3)

## 2023-06-18 LAB — BASIC METABOLIC PANEL
BUN: 15 mg/dL (ref 6–23)
CO2: 26 meq/L (ref 19–32)
Calcium: 9.4 mg/dL (ref 8.4–10.5)
Chloride: 101 meq/L (ref 96–112)
Creatinine, Ser: 1.01 mg/dL (ref 0.40–1.50)
GFR: 69.62 mL/min (ref >60.00)
Glucose, Bld: 112 mg/dL — ABNORMAL HIGH (ref 70–99)
Potassium: 4.3 meq/L (ref 3.5–5.1)
Sodium: 135 meq/L (ref 135–145)

## 2023-06-18 LAB — MICROALBUMIN / CREATININE URINE RATIO
Creatinine,U: 66.6 mg/dL
Microalb Creat Ratio: 1.8 mg/g (ref 0.0–30.0)
Microalb, Ur: 1.2 mg/dL (ref 0.0–1.9)

## 2023-06-18 LAB — LIPID PANEL
Cholesterol: 105 mg/dL (ref 0–200)
HDL: 43.7 mg/dL (ref >39.00)
LDL Cholesterol: 44 mg/dL (ref 0–99)
NonHDL: 61.45
Total CHOL/HDL Ratio: 2
Triglycerides: 85 mg/dL (ref 0.0–149.0)
VLDL: 17 mg/dL (ref 0.0–40.0)

## 2023-06-18 LAB — HEMOGLOBIN A1C: Hgb A1c MFr Bld: 6.5 % (ref 4.6–6.5)

## 2023-06-21 ENCOUNTER — Ambulatory Visit (INDEPENDENT_AMBULATORY_CARE_PROVIDER_SITE_OTHER): Payer: PPO | Admitting: Internal Medicine

## 2023-06-21 ENCOUNTER — Encounter: Payer: Self-pay | Admitting: Internal Medicine

## 2023-06-21 ENCOUNTER — Telehealth: Payer: Self-pay

## 2023-06-21 VITALS — BP 120/72 | HR 85 | Temp 98.0°F | Resp 16 | Ht 65.0 in | Wt 166.2 lb

## 2023-06-21 DIAGNOSIS — E118 Type 2 diabetes mellitus with unspecified complications: Secondary | ICD-10-CM

## 2023-06-21 DIAGNOSIS — I10 Essential (primary) hypertension: Secondary | ICD-10-CM

## 2023-06-21 DIAGNOSIS — Z0000 Encounter for general adult medical examination without abnormal findings: Secondary | ICD-10-CM

## 2023-06-21 DIAGNOSIS — E785 Hyperlipidemia, unspecified: Secondary | ICD-10-CM | POA: Diagnosis not present

## 2023-06-21 DIAGNOSIS — Z125 Encounter for screening for malignant neoplasm of prostate: Secondary | ICD-10-CM | POA: Diagnosis not present

## 2023-06-21 DIAGNOSIS — J479 Bronchiectasis, uncomplicated: Secondary | ICD-10-CM

## 2023-06-21 DIAGNOSIS — I2510 Atherosclerotic heart disease of native coronary artery without angina pectoris: Secondary | ICD-10-CM

## 2023-06-21 DIAGNOSIS — J4489 Other specified chronic obstructive pulmonary disease: Secondary | ICD-10-CM

## 2023-06-21 DIAGNOSIS — R319 Hematuria, unspecified: Secondary | ICD-10-CM

## 2023-06-21 DIAGNOSIS — I779 Disorder of arteries and arterioles, unspecified: Secondary | ICD-10-CM | POA: Diagnosis not present

## 2023-06-21 DIAGNOSIS — R413 Other amnesia: Secondary | ICD-10-CM | POA: Insufficient documentation

## 2023-06-21 DIAGNOSIS — I771 Stricture of artery: Secondary | ICD-10-CM | POA: Diagnosis not present

## 2023-06-21 LAB — CBC WITH DIFFERENTIAL/PLATELET
Basophils Absolute: 0 10*3/uL (ref 0.0–0.1)
Basophils Relative: 0.5 % (ref 0.0–3.0)
Eosinophils Absolute: 0.1 10*3/uL (ref 0.0–0.7)
Eosinophils Relative: 2 % (ref 0.0–5.0)
HCT: 46.2 % (ref 39.0–52.0)
Hemoglobin: 15.3 g/dL (ref 13.0–17.0)
Lymphocytes Relative: 19.8 % (ref 12.0–46.0)
Lymphs Abs: 1.5 10*3/uL (ref 0.7–4.0)
MCHC: 33.1 g/dL (ref 30.0–36.0)
MCV: 93.6 fL (ref 78.0–100.0)
Monocytes Absolute: 0.8 10*3/uL (ref 0.1–1.0)
Monocytes Relative: 11 % (ref 3.0–12.0)
Neutro Abs: 4.9 10*3/uL (ref 1.4–7.7)
Neutrophils Relative %: 66.7 % (ref 43.0–77.0)
Platelets: 300 10*3/uL (ref 150.0–400.0)
RBC: 4.94 Mil/uL (ref 4.22–5.81)
RDW: 13.7 % (ref 11.5–15.5)
WBC: 7.4 10*3/uL (ref 4.0–10.5)

## 2023-06-21 LAB — HM DIABETES FOOT EXAM

## 2023-06-21 LAB — VITAMIN B12: Vitamin B-12: 293 pg/mL (ref 211–911)

## 2023-06-21 LAB — PSA, MEDICARE: PSA: 0.7 ng/mL (ref 0.10–4.00)

## 2023-06-21 NOTE — Assessment & Plan Note (Signed)
Established with pulmonary.  Continue trelegy.   Breathing overall improved on current regimen.

## 2023-06-21 NOTE — Assessment & Plan Note (Signed)
[facility] 12/2022 - Duplex ultrasound shows <40% stenosis bilaterally. F/u 12 months.

## 2023-06-21 NOTE — Assessment & Plan Note (Signed)
Continue micardis 40mg  q day.  Follow pressures.  Follow metabolic panel.

## 2023-06-21 NOTE — Progress Notes (Signed)
Subjective:    Patient ID: James Peck, male    DOB: 12-Jun-1941, 82 y.o.   MRN: [id number]  Patient here for  Chief Complaint  Patient presents with   Annual Exam    HPI Here for a physical exam. Saw pulmonary 06/04/23 - f/u asthma/COPD overlap syndrome and bronchiectasis. On trelegy and doing well. Breathing stable.  No chest pain.  No nausea or vomiting.  No abdominal pain or bowel change.  Does report some joint aches at times.  Previous left shoulder pain - resolved.  Recent right shoulder - better.  Some occasional leg cramps at night.  No claudication. Stays active. Is concerned regarding some memory change.  Some trouble remembering names.     Past Medical History:  Diagnosis Date   Allergy    Arthritis    Coronary artery disease    GERD (gastroesophageal reflux disease)    History of hiatal hernia    Hypercholesteremia    Hypertension    Past Surgical History:  Procedure Laterality Date   CARDIAC CATHETERIZATION     cardiac stents     CATARACT EXTRACTION W/PHACO Right 09/10/2017   Procedure: CATARACT EXTRACTION PHACO AND INTRAOCULAR LENS PLACEMENT (IOC);  Surgeon: Galen Manila, MD;  Location: [facility] ORS;  Service: Ophthalmology;  Laterality: Right;  [place] 01:01.7 AP% 15.3 CDE 9.38 Fluid pack lot # [id number]   CATARACT EXTRACTION W/PHACO Left 10/02/2017   Procedure: CATARACT EXTRACTION PHACO AND INTRAOCULAR LENS PLACEMENT (IOC);  Surgeon: Galen Manila, MD;  Location: [facility] ORS;  Service: Ophthalmology;  Laterality: Left;  [place] 00:42.3 AP% 16.2 CDE 6.85 Fluid Pack Lot # [id number]   COLONOSCOPY WITH PROPOFOL N/A 06/28/2017   Procedure: COLONOSCOPY WITH PROPOFOL;  Surgeon: Scot Jun, MD;  Location: [facility] ENDOSCOPY;  Service: Endoscopy;  Laterality: N/A;   COLONOSCOPY WITH PROPOFOL N/A 12/12/2021   Procedure: COLONOSCOPY WITH PROPOFOL;  Surgeon: Regis Bill, MD;  Location: [facility] ENDOSCOPY;  Service: Endoscopy;  Laterality: N/A;   CORONARY ANGIOPLASTY      STENTS X 2   EYE SURGERY     KNEE ARTHROSCOPY     LEFT HEART CATH AND CORONARY ANGIOGRAPHY N/A 09/12/2020   Procedure: LEFT HEART CATH AND CORONARY ANGIOGRAPHY;  Surgeon: Marcina Millard, MD;  Location: [facility] INVASIVE CV LAB;  Service: Cardiovascular;  Laterality: N/A;   Family History  Problem Relation Age of Onset   Heart disease Mother    Diabetes Mother    Heart disease Father    Diabetes Father    Alzheimer's disease Brother    Stroke Brother    Social History   Socioeconomic History   Marital status: Married    Spouse name: Not on file   Number of children: Not on file   Years of education: Not on file   Highest education level: 12th grade  Occupational History   Not on file  Tobacco Use   Smoking status: Former    Current packs/day: 0.00    Average packs/day: 1 pack/day for 40.0 years (40.0 ttl pk-yrs)    Types: Cigarettes    Start date: 108    Quit date: 2000    Years since quitting: 24.9   Smokeless tobacco: Never  Vaping Use   Vaping status: Never Used  Substance and Sexual Activity   Alcohol use: No   Drug use: No   Sexual activity: Not on file  Other Topics Concern   Not on file  Social History Narrative   Not on file  Social Determinants of Health   Financial Resource Strain: Low Risk  (02/14/2023)   Overall Financial Resource Strain (CARDIA)    Difficulty of Paying Living Expenses: Not hard at all  Food Insecurity: No Food Insecurity (02/14/2023)   Hunger Vital Sign    Worried About Running Out of Food in the Last Year: Never true    Ran Out of Food in the Last Year: Never true  Transportation Needs: No Transportation Needs (02/14/2023)   PRAPARE - Administrator, Civil Service (Medical): No    Lack of Transportation (Non-Medical): No  Physical Activity: Sufficiently Active (02/14/2023)   Exercise Vital Sign    Days of Exercise per Week: 5 days    Minutes of Exercise per Session: 30 min  Stress: No Stress Concern Present  (02/14/2023)   [employer] of Occupational Health - Occupational Stress Questionnaire    Feeling of Stress : Not at all  Social Connections: Socially Integrated (02/14/2023)   Social Connection and Isolation Panel [NHANES]    Frequency of Communication with Friends and Family: More than three times a week    Frequency of Social Gatherings with Friends and Family: Three times a week    Attends Religious Services: More than 4 times per year    Active Member of Clubs or Organizations: Yes    Attends Banker Meetings: 1 to 4 times per year    Marital Status: Married     Review of Systems  Constitutional:  Negative for appetite change and unexpected weight change.  HENT:  Negative for congestion, sinus pressure and sore throat.   Eyes:  Negative for pain and visual disturbance.  Respiratory:  Negative for cough, chest tightness and shortness of breath.   Cardiovascular:  Negative for chest pain, palpitations and leg swelling.  Gastrointestinal:  Negative for abdominal pain, diarrhea, nausea and vomiting.  Genitourinary:  Negative for difficulty urinating and dysuria.  Musculoskeletal:  Negative for joint swelling and myalgias.  Skin:  Negative for color change and rash.  Neurological:  Negative for dizziness and headaches.  Hematological:  Negative for adenopathy. Does not bruise/bleed easily.  Psychiatric/Behavioral:  Negative for agitation and dysphoric mood.        Objective:     BP 120/72   Pulse 85   Temp 98 F (36.7 C)   Resp 16   Ht 5\' 5"  (1.651 m)   Wt 166 lb 3.2 oz (75.4 kg)   SpO2 97%   BMI 27.66 kg/m  Wt Readings from Last 3 Encounters:  06/21/23 166 lb 3.2 oz (75.4 kg)  03/04/23 164 lb 9.6 oz (74.7 kg)  02/23/23 164 lb 12.8 oz (74.8 kg)    Physical Exam Constitutional:      General: He is not in acute distress.    Appearance: Normal appearance. He is well-developed.  HENT:     Head: Normocephalic and atraumatic.     Right Ear: External  ear normal.     Left Ear: External ear normal.  Eyes:     General: No scleral icterus.       Right eye: No discharge.        Left eye: No discharge.     Conjunctiva/sclera: Conjunctivae normal.  Neck:     Thyroid: No thyromegaly.  Cardiovascular:     Rate and Rhythm: Normal rate and regular rhythm.  Pulmonary:     Effort: No respiratory distress.     Breath sounds: Normal breath sounds. No wheezing.  Abdominal:  General: Bowel sounds are normal.     Palpations: Abdomen is soft.     Tenderness: There is no abdominal tenderness.  Musculoskeletal:        General: No swelling or tenderness.     Cervical back: Neck supple. No tenderness.  Lymphadenopathy:     Cervical: No cervical adenopathy.  Skin:    Findings: No erythema or rash.  Neurological:     Mental Status: He is alert and oriented to person, place, and time.  Psychiatric:        Mood and Affect: Mood normal.        Behavior: Behavior normal.      Outpatient Encounter Medications as of 06/21/2023  Medication Sig   albuterol (VENTOLIN HFA) 108 (90 Base) MCG/ACT inhaler Inhale 1 puff into the lungs every 6 (six) hours as needed for wheezing or shortness of breath.   aspirin EC 81 MG tablet Take 81 mg by mouth daily.    calcium carbonate (OS-CAL - DOSED IN MG OF ELEMENTAL CALCIUM) 1250 (500 CA) MG tablet Take 1 tablet by mouth daily.    cetirizine (ZYRTEC) 10 MG tablet TAKE 1 TABLET BY MOUTH DAILY   CINNAMON PO Take 1,200 mg by mouth 2 times daily at 12 noon and 4 pm.   ezetimibe (ZETIA) 10 MG tablet Take 1 tablet (10 mg total) by mouth daily.   famotidine (PEPCID) 20 MG tablet TAKE 1 TABLET BY MOUTH DAILY   finasteride (PROSCAR) 5 MG tablet Take 1 tablet (5 mg total) by mouth daily.   fluticasone (FLONASE) 50 MCG/ACT nasal spray TAKE 2 SPRAYS INTO BOTH NOSTRILS DAILY   Fluticasone-Umeclidin-Vilant (TRELEGY ELLIPTA) 100-62.5-25 MCG/ACT AEPB Inhale 1 puff into the lungs daily.   Fluticasone-Umeclidin-Vilant (TRELEGY  ELLIPTA) 100-62.5-25 MCG/ACT AEPB Inhale 1 puff into the lungs daily.   GLUCOSAMINE-CHONDROITIN PO Take 1 capsule by mouth daily.   Misc Natural Products (BLACK CHERRY CONCENTRATE PO) Take 1,000 mg daily by mouth.   nitroGLYCERIN (NITROSTAT) 0.4 MG SL tablet Place 1 tablet (0.4 mg total) under the tongue every 5 (five) minutes as needed for chest pain.   Omega-3 Fatty Acids (FISH OIL) 1000 MG CAPS Take 1,000 mg by mouth daily.    rosuvastatin (CRESTOR) 20 MG tablet Take 1 tablet (20 mg total) by mouth daily.   sildenafil (REVATIO) 20 MG tablet TAKE 1 TO 2 TABLETS BY MOUTH EVERY DAY AS NEEDED   telmisartan (MICARDIS) 40 MG tablet TAKE 1 TABLET BY MOUTH ONCE DAILY   No facility-administered encounter medications on file as of 06/21/2023.     Lab Results  Component Value Date   WBC 6.0 08/09/2022   HGB 14.4 08/09/2022   HCT 42.7 08/09/2022   PLT 280.0 08/09/2022   GLUCOSE 112 (H) 06/18/2023   CHOL 105 06/18/2023   TRIG 85.0 06/18/2023   HDL 43.70 06/18/2023   LDLCALC 44 06/18/2023   ALT 26 06/18/2023   AST 23 06/18/2023   NA 135 06/18/2023   K 4.3 06/18/2023   CL 101 06/18/2023   CREATININE 1.01 06/18/2023   BUN 15 06/18/2023   CO2 26 06/18/2023   TSH 2.46 02/13/2023   PSA 1.27 11/28/2020   INR 1.0 09/11/2020   HGBA1C 6.5 06/18/2023   MICROALBUR 1.2 06/18/2023    DG Chest 2 View  Result Date: 03/22/2022 CLINICAL DATA:  Persistent cough and congestion EXAM: CHEST - 2 VIEW COMPARISON:  Chest x-ray dated 09/11/2020 FINDINGS: Heart size and mediastinal contours are within normal limits. Lungs are  hyperexpanded. Chronic bronchitic changes noted centrally. No pulmonary nodule or mass is seen. No confluent airspace opacity to suggest a developing pneumonia. No pleural effusion or pneumothorax is seen. No acute-appearing osseous abnormality. IMPRESSION: 1. No active cardiopulmonary disease. No evidence of pneumonia or pulmonary edema. 2. Hyperexpanded lungs indicating COPD.  Electronically Signed   By: Bary Richard M.D.   On: 03/22/2022 08:31       Assessment & Plan:  Routine general medical examination at a health care facility  Healthcare maintenance Assessment & Plan: Physical today 06/21/23.  Colonoscopy 11/2021.  No f/u recommended.  Check psa today.    Hyperlipidemia, unspecified hyperlipidemia type Assessment & Plan: On crestor and zetia.  Low cholesterol diet and exercise.  Follow lipid panel and liver function tests.  Lab Results  Component Value Date   CHOL 105 06/18/2023   HDL 43.70 06/18/2023   LDLCALC 44 06/18/2023   TRIG 85.0 06/18/2023   CHOLHDL 2 06/18/2023     Orders: -     Lipid panel; Future -     Basic metabolic panel; Future -     Hepatic function panel; Future -     CBC with Differential/Platelet  Diabetes mellitus type 2 with complications (HCC) Assessment & Plan: Low carb diet and exercise.  Follow met b and A1c. On no medication.   Lab Results  Component Value Date   HGBA1C 6.5 06/18/2023     Orders: -     Hemoglobin A1c; Future  Memory change Assessment & Plan: Reported some memory change as outlined.  Discussed importance of social interaction, exercise.  Check cbc and b12.  Mini mental status exam - 28/30.   Orders: -     Vitamin B12  Prostate cancer screening -     PSA, Medicare  Asthma-COPD overlap syndrome [facility]) Assessment & Plan: Seeing - Dr Jayme Cloud - continue trelegy and prn albuterol. Breathing stable.    Benign essential HTN Assessment & Plan: Continue micardis 40mg  q day.  Follow pressures.  Follow metabolic panel.    Bronchiectasis without complication [place]) Assessment & Plan: Established with pulmonary.  Continue trelegy.   Breathing overall improved on current regimen.    CAD in native artery Assessment & Plan: Previously admitted with NSTEMI 08/2020.  09/12/20 - cath (50% mid/distal LAD).  No chest pain now.  Has been followed by Dr Lady Gary.  Continue micardis. Continue crestor and  zetia.    Bilateral carotid artery disease, unspecified type [facility]) Assessment & Plan: [facility] 12/2022 - Duplex ultrasound shows <40% stenosis bilaterally. F/u 12 months.    Hematuria, unspecified type Assessment & Plan: Evaluated by Dr Lonna Cobb - 09/2021 - recommended CT urogram and cystoscopy.  Cysto (10/30/21) - No bladder mucosal abnormalities. Hematuria most likely secondary to BPH. Started finasteride 5 mg daily. Follow-up 1 year or earlier for recurrent hematuria. Had f/u with urology - 10/2022 - off plavix.  No recurring episodes of hematuria.  No changes made.  Recommended f/u in one year.    Subclavian artery stenosis, left [facility]) Assessment & Plan: Saw [facility] 01/24/23 - Carotid Duplex done today shows <40%.  No change compared to last study in 2023. Vertebral arteries were well-visualized today with no evidence of stenosis and normal flow hemodynamics in the bilateral subclavian arteries. Recommended continued antiplatelet therapy and f/u 12 months.       Dale [place], MD

## 2023-06-21 NOTE — Assessment & Plan Note (Signed)
Saw [facility] 01/24/23 - Carotid Duplex done today shows <40%.  No change compared to last study in 2023. Vertebral arteries were well-visualized today with no evidence of stenosis and normal flow hemodynamics in the bilateral subclavian arteries. Recommended continued antiplatelet therapy and f/u 12 months.

## 2023-06-21 NOTE — Telephone Encounter (Signed)
Medication Samples have been provided to the patient.  Drug name: TRELEGY       Strength: 100/62.5/25        Qty: 2 BOXES  LOT: [employer]  Exp.Date: 11/26/2024  Dosing instructions: 1 PUFF DAILY  The patient has been instructed regarding the correct time, dose, and frequency of taking this medication, including desired effects and most common side effects.   James Peck 9:12 AM 06/21/2023

## 2023-06-21 NOTE — Assessment & Plan Note (Addendum)
Reported some memory change as outlined.  Discussed importance of social interaction, exercise.  Check cbc and b12.  Mini mental status exam - 28/30.

## 2023-06-21 NOTE — Assessment & Plan Note (Signed)
Previously admitted with NSTEMI 08/2020.  09/12/20 - cath (50% mid/distal LAD).  No chest pain now.  Has been followed by Dr Lady Gary.  Continue micardis. Continue crestor and zetia.

## 2023-06-21 NOTE — Assessment & Plan Note (Signed)
Evaluated by Dr Lonna Cobb - 09/2021 - recommended CT urogram and cystoscopy.  Cysto (10/30/21) - No bladder mucosal abnormalities. Hematuria most likely secondary to BPH. Started finasteride 5 mg daily. Follow-up 1 year or earlier for recurrent hematuria. Had f/u with urology - 10/2022 - off plavix.  No recurring episodes of hematuria.  No changes made.  Recommended f/u in one year.

## 2023-06-21 NOTE — Assessment & Plan Note (Signed)
Low carb diet and exercise.  Follow met b and A1c. On no medication.   Lab Results  Component Value Date   HGBA1C 6.5 06/18/2023

## 2023-06-21 NOTE — Assessment & Plan Note (Addendum)
Physical today 06/21/23.  Colonoscopy 11/2021.  No f/u recommended.  Check psa today.

## 2023-06-21 NOTE — Assessment & Plan Note (Signed)
Seeing - Dr Jayme Cloud - continue trelegy and prn albuterol. Breathing stable.

## 2023-06-21 NOTE — Assessment & Plan Note (Signed)
On crestor and zetia.  Low cholesterol diet and exercise.  Follow lipid panel and liver function tests.  Lab Results  Component Value Date   CHOL 105 06/18/2023   HDL 43.70 06/18/2023   LDLCALC 44 06/18/2023   TRIG 85.0 06/18/2023   CHOLHDL 2 06/18/2023

## 2023-06-24 ENCOUNTER — Encounter: Payer: Self-pay | Admitting: Internal Medicine

## 2023-07-01 ENCOUNTER — Other Ambulatory Visit: Payer: Self-pay | Admitting: Urology

## 2023-07-02 DIAGNOSIS — E663 Overweight: Secondary | ICD-10-CM | POA: Diagnosis not present

## 2023-07-02 DIAGNOSIS — M461 Sacroiliitis, not elsewhere classified: Secondary | ICD-10-CM | POA: Diagnosis not present

## 2023-07-02 DIAGNOSIS — E1169 Type 2 diabetes mellitus with other specified complication: Secondary | ICD-10-CM | POA: Diagnosis not present

## 2023-07-02 DIAGNOSIS — K219 Gastro-esophageal reflux disease without esophagitis: Secondary | ICD-10-CM | POA: Diagnosis not present

## 2023-07-02 DIAGNOSIS — J309 Allergic rhinitis, unspecified: Secondary | ICD-10-CM | POA: Diagnosis not present

## 2023-07-02 DIAGNOSIS — E785 Hyperlipidemia, unspecified: Secondary | ICD-10-CM | POA: Diagnosis not present

## 2023-07-02 DIAGNOSIS — J439 Emphysema, unspecified: Secondary | ICD-10-CM | POA: Diagnosis not present

## 2023-07-02 DIAGNOSIS — E1151 Type 2 diabetes mellitus with diabetic peripheral angiopathy without gangrene: Secondary | ICD-10-CM | POA: Diagnosis not present

## 2023-07-02 DIAGNOSIS — I25119 Atherosclerotic heart disease of native coronary artery with unspecified angina pectoris: Secondary | ICD-10-CM | POA: Diagnosis not present

## 2023-07-02 DIAGNOSIS — N529 Male erectile dysfunction, unspecified: Secondary | ICD-10-CM | POA: Diagnosis not present

## 2023-07-02 DIAGNOSIS — I10 Essential (primary) hypertension: Secondary | ICD-10-CM | POA: Diagnosis not present

## 2023-07-02 DIAGNOSIS — N401 Enlarged prostate with lower urinary tract symptoms: Secondary | ICD-10-CM | POA: Diagnosis not present

## 2023-07-03 ENCOUNTER — Ambulatory Visit: Payer: PPO

## 2023-07-03 DIAGNOSIS — R413 Other amnesia: Secondary | ICD-10-CM

## 2023-07-03 MED ORDER — CYANOCOBALAMIN 1000 MCG/ML IJ SOLN
1000.0000 ug | Freq: Once | INTRAMUSCULAR | Status: AC
  Administered 2023-07-03: 1000 ug via INTRAMUSCULAR

## 2023-07-03 NOTE — Progress Notes (Signed)
Patient presented for B 12 injection to left deltoid, patient voiced no concerns nor showed any signs of distress during injection. 

## 2023-07-11 ENCOUNTER — Ambulatory Visit: Payer: PPO

## 2023-07-11 DIAGNOSIS — E538 Deficiency of other specified B group vitamins: Secondary | ICD-10-CM | POA: Diagnosis not present

## 2023-07-11 MED ORDER — CYANOCOBALAMIN 1000 MCG/ML IJ SOLN
1000.0000 ug | Freq: Once | INTRAMUSCULAR | Status: AC
  Administered 2023-07-11: 1000 ug via INTRAMUSCULAR

## 2023-07-11 NOTE — Progress Notes (Signed)
Patient presented for B 12 injection to right deltoid, patient voiced no concerns nor showed any signs of distress during injection. 

## 2023-07-18 ENCOUNTER — Ambulatory Visit: Payer: PPO

## 2023-07-18 DIAGNOSIS — E538 Deficiency of other specified B group vitamins: Secondary | ICD-10-CM

## 2023-07-18 MED ORDER — CYANOCOBALAMIN 1000 MCG/ML IJ SOLN
1000.0000 ug | Freq: Once | INTRAMUSCULAR | Status: AC
  Administered 2023-07-18: 1000 ug via INTRAMUSCULAR

## 2023-07-18 NOTE — Progress Notes (Signed)
Pt presented for their vitamin B12 injection. Pt was identified through two identifiers. Pt tolerated shot well in their left  deltoid.  

## 2023-07-25 ENCOUNTER — Ambulatory Visit: Payer: PPO

## 2023-07-25 DIAGNOSIS — E538 Deficiency of other specified B group vitamins: Secondary | ICD-10-CM

## 2023-07-25 MED ORDER — CYANOCOBALAMIN 1000 MCG/ML IJ SOLN
1000.0000 ug | Freq: Once | INTRAMUSCULAR | Status: AC
  Administered 2023-07-25: 1000 ug via INTRAMUSCULAR

## 2023-07-25 NOTE — Progress Notes (Signed)
Patient is in office today for a nurse visit for B12 Injection. Patient Injection was given in the  Right deltoid. Patient tolerated injection well.

## 2023-07-26 ENCOUNTER — Ambulatory Visit (INDEPENDENT_AMBULATORY_CARE_PROVIDER_SITE_OTHER): Payer: PPO | Admitting: *Deleted

## 2023-07-26 VITALS — Ht 65.0 in | Wt 160.0 lb

## 2023-07-26 DIAGNOSIS — Z0000 Encounter for general adult medical examination without abnormal findings: Secondary | ICD-10-CM | POA: Diagnosis not present

## 2023-07-26 NOTE — Progress Notes (Signed)
Subjective:   James Peck is a 82 y.o. male who presents for Medicare Annual/Subsequent preventive examination.  Visit Complete: Virtual I connected with  James Peck on 07/26/23 by a audio enabled telemedicine application and verified that I am speaking with the correct person using two identifiers.  Patient Location: Home  Provider Location: Home Office  I discussed the limitations of evaluation and management by telemedicine. The patient expressed understanding and agreed to proceed.  Vital Signs: Because this visit was a virtual/telehealth visit, some criteria may be missing or patient reported. Any vitals not documented were not able to be obtained and vitals that have been documented are patient reported.  Patient Medicare AWV questionnaire was completed by the patient on 07/24/23; I have confirmed that all information answered by patient is correct and no changes since this date.  Cardiac Risk Factors include: advanced age (>57men, >64 women);diabetes mellitus;male gender;hypertension;dyslipidemia;Other (see comment), Risk factor comments: CAD     Objective:    Today's Vitals   07/26/23 0823  Weight: 160 lb (72.6 kg)  Height: 5\' 5"  (1.651 m)   Body mass index is 26.63 kg/m.     07/26/2023    8:38 AM 07/16/2022    9:56 AM 12/12/2021   10:18 AM 07/04/2021    1:31 PM 11/04/2020   10:19 AM 09/12/2020   12:00 PM 09/12/2020    7:45 AM  Advanced Directives  Does Patient Have a Medical Advance Directive? Yes Yes Yes Yes Yes Yes Yes  Type of Estate agent of [place];Living will Healthcare Power of [employer] of [place];Living will Healthcare Power of [employer] Power of [employer] Power of Attorney  Does patient want to make changes to medical advance directive?  No - Patient declined  No - Patient declined Yes (MAU/Ambulatory/Procedural Areas - Information given) No - Patient declined   Copy of Healthcare Power of  Attorney in Chart? No - copy requested   No - copy requested  No - copy requested No - copy requested    Current Medications (verified) Outpatient Encounter Medications as of 07/26/2023  Medication Sig   albuterol (VENTOLIN HFA) 108 (90 Base) MCG/ACT inhaler Inhale 1 puff into the lungs every 6 (six) hours as needed for wheezing or shortness of breath.   aspirin EC 81 MG tablet Take 81 mg by mouth daily.    calcium carbonate (OS-CAL - DOSED IN MG OF ELEMENTAL CALCIUM) 1250 (500 CA) MG tablet Take 1 tablet by mouth daily.    cetirizine (ZYRTEC) 10 MG tablet TAKE 1 TABLET BY MOUTH DAILY   CINNAMON PO Take 1,200 mg by mouth 2 times daily at 12 noon and 4 pm.   ezetimibe (ZETIA) 10 MG tablet Take 1 tablet (10 mg total) by mouth daily.   famotidine (PEPCID) 20 MG tablet TAKE 1 TABLET BY MOUTH DAILY   finasteride (PROSCAR) 5 MG tablet TAKE ONE TABLET EVERY DAY   fluticasone (FLONASE) 50 MCG/ACT nasal spray TAKE 2 SPRAYS INTO BOTH NOSTRILS DAILY   Fluticasone-Umeclidin-Vilant (TRELEGY ELLIPTA) 100-62.5-25 MCG/ACT AEPB Inhale 1 puff into the lungs daily.   Fluticasone-Umeclidin-Vilant (TRELEGY ELLIPTA) 100-62.5-25 MCG/ACT AEPB Inhale 1 puff into the lungs daily.   GLUCOSAMINE-CHONDROITIN PO Take 1 capsule by mouth daily.   Misc Natural Products (BLACK CHERRY CONCENTRATE PO) Take 1,000 mg daily by mouth.   nitroGLYCERIN (NITROSTAT) 0.4 MG SL tablet Place 1 tablet (0.4 mg total) under the tongue every 5 (five) minutes as needed for chest pain.  Omega-3 Fatty Acids (FISH OIL) 1000 MG CAPS Take 1,000 mg by mouth daily.    rosuvastatin (CRESTOR) 20 MG tablet Take 1 tablet (20 mg total) by mouth daily.   sildenafil (REVATIO) 20 MG tablet TAKE 1 TO 2 TABLETS BY MOUTH EVERY DAY AS NEEDED   telmisartan (MICARDIS) 40 MG tablet TAKE 1 TABLET BY MOUTH ONCE DAILY   No facility-administered encounter medications on file as of 07/26/2023.    Allergies (verified) Ace inhibitors and Enalapril   History: Past  Medical History:  Diagnosis Date   Allergy    Arthritis    Coronary artery disease    GERD (gastroesophageal reflux disease)    History of hiatal hernia    Hypercholesteremia    Hypertension    Past Surgical History:  Procedure Laterality Date   CARDIAC CATHETERIZATION     cardiac stents     CATARACT EXTRACTION W/PHACO Right 09/10/2017   Procedure: CATARACT EXTRACTION PHACO AND INTRAOCULAR LENS PLACEMENT (IOC);  Surgeon: Galen Manila, MD;  Location: [facility] ORS;  Service: Ophthalmology;  Laterality: Right;  [place] 01:01.7 AP% 15.3 CDE 9.38 Fluid pack lot # [id number]   CATARACT EXTRACTION W/PHACO Left 10/02/2017   Procedure: CATARACT EXTRACTION PHACO AND INTRAOCULAR LENS PLACEMENT (IOC);  Surgeon: Galen Manila, MD;  Location: [facility] ORS;  Service: Ophthalmology;  Laterality: Left;  [place] 00:42.3 AP% 16.2 CDE 6.85 Fluid Pack Lot # [id number]   COLONOSCOPY WITH PROPOFOL N/A 06/28/2017   Procedure: COLONOSCOPY WITH PROPOFOL;  Surgeon: Scot Jun, MD;  Location[facility];  Service: Endoscopy;  Laterality: N/A;   COLONOSCOPY WITH PROPOFOL N/A 12/12/2021   Procedure: COLONOSCOPY WITH PROPOFOL;  Surgeon: Regis Bill, MD;  Location: [facility] ENDOSCOPY;  Service: Endoscopy;  Laterality: N/A;   CORONARY ANGIOPLASTY     STENTS X 2   EYE SURGERY     KNEE ARTHROSCOPY     LEFT HEART CATH AND CORONARY ANGIOGRAPHY N/A 09/12/2020   Procedure: LEFT HEART CATH AND CORONARY ANGIOGRAPHY;  Surgeon: Marcina Millard, MD;  Location: [facility] INVASIVE CV LAB;  Service: Cardiovascular;  Laterality: N/A;   Family History  Problem Relation Age of Onset   Heart disease Mother    Diabetes Mother    Heart disease Father    Diabetes Father    Alzheimer's disease Brother    Stroke Brother    Social History   Socioeconomic History   Marital status: Married    Spouse name: Not on file   Number of children: Not on file   Years of education: Not on file   Highest education level: 12th grade   Occupational History   Not on file  Tobacco Use   Smoking status: Former    Current packs/day: 0.00    Average packs/day: 1 pack/day for 40.0 years (40.0 ttl pk-yrs)    Types: Cigarettes    Start date: 62    Quit date: 2000    Years since quitting: 25.0   Smokeless tobacco: Never  Vaping Use   Vaping status: Never Used  Substance and Sexual Activity   Alcohol use: No   Drug use: No   Sexual activity: Not on file  Other Topics Concern   Not on file  Social History Narrative   Married   Social Drivers of Health   Financial Resource Strain: Low Risk  (07/26/2023)   Overall Financial Resource Strain (CARDIA)    Difficulty of Paying Living Expenses: Not hard at all  Food Insecurity: No Food Insecurity (07/26/2023)   Hunger  Vital Sign    Worried About Programme researcher, broadcasting/film/video in the Last Year: Never true    Ran Out of Food in the Last Year: Never true  Transportation Needs: No Transportation Needs (07/26/2023)   PRAPARE - Administrator, Civil Service (Medical): No    Lack of Transportation (Non-Medical): No  Physical Activity: Sufficiently Active (07/26/2023)   Exercise Vital Sign    Days of Exercise per Week: 5 days    Minutes of Exercise per Session: 30 min  Stress: No Stress Concern Present (07/26/2023)   [employer] of Occupational Health - Occupational Stress Questionnaire    Feeling of Stress : Not at all  Social Connections: Socially Integrated (07/26/2023)   Social Connection and Isolation Panel [NHANES]    Frequency of Communication with Friends and Family: More than three times a week    Frequency of Social Gatherings with Friends and Family: Three times a week    Attends Religious Services: More than 4 times per year    Active Member of Clubs or Organizations: Yes    Attends Engineer, structural: More than 4 times per year    Marital Status: Married    Tobacco Counseling Counseling given: Not Answered   Clinical  Intake:  Pre-visit preparation completed: Yes  Pain : No/denies pain     BMI - recorded: 26.63 Nutritional Status: BMI 25 -29 Overweight Nutritional Risks: None Diabetes: Yes CBG done?: No Did pt. bring in CBG monitor from home?: No  How often do you need to have someone help you when you read instructions, pamphlets, or other written materials from your doctor or pharmacy?: 1 - Never  Interpreter Needed?: No  Information entered by :: R. Hadrian Yarbrough LPN   Activities of Daily Living    07/26/2023    8:27 AM 07/24/2023    6:59 PM  In your present state of health, do you have any difficulty performing the following activities:  Hearing? 0 0  Vision? 0 0  Comment glasses   Difficulty concentrating or making decisions? 0 0  Walking or climbing stairs? 0   Dressing or bathing? 0 0  Doing errands, shopping? 0 0  Preparing Food and eating ? N N  Using the Toilet? N N  In the past six months, have you accidently leaked urine? N N  Do you have problems with loss of bowel control? N N  Managing your Medications? N N  Managing your Finances? N N  Housekeeping or managing your Housekeeping? N N    Patient Care Team: Dale Nibley, MD as PCP - General (Internal Medicine) Salena Saner, MD as Consulting Physician (Pulmonary Disease)  Indicate any recent Medical Services you may have received from other than [facility] providers in the past year (date may be approximate).     Assessment:   This is a routine wellness examination for Shakeer.  Hearing/Vision screen Hearing Screening - Comments:: No issues Vision Screening - Comments:: glasses   Goals Addressed             This Visit's Progress    Patient Stated       Wants to continue to stay active       Depression Screen    07/26/2023    8:33 AM 07/19/2022    9:33 AM 07/16/2022   10:21 AM 07/16/2022   10:03 AM 05/24/2022    9:14 AM 03/20/2022    4:23 PM 07/04/2021    1:29 PM  PHQ 2/9  Scores  PHQ - 2 Score 0 0 0  0 0 0 0  PHQ- 9 Score 0          Fall Risk    07/26/2023    8:29 AM 07/24/2023    6:59 PM 07/19/2022    9:33 AM 07/16/2022   10:02 AM 05/24/2022    9:13 AM  Fall Risk   Falls in the past year? 0 0 0 0 0  Number falls in past yr: 0  0 0 0  Injury with Fall? 0 0 0 0 0  Risk for fall due to : No Fall Risks  No Fall Risks No Fall Risks No Fall Risks  Follow up Falls prevention discussed;Falls evaluation completed  Falls evaluation completed Falls evaluation completed;Falls prevention discussed Falls evaluation completed    MEDICARE RISK AT HOME: Medicare Risk at Home Any stairs in or around the home?: Yes If so, are there any without handrails?: (Patient-Rptd) No Home free of loose throw rugs in walkways, pet beds, electrical cords, etc?: (Patient-Rptd) Yes Adequate lighting in your home to reduce risk of falls?: (Patient-Rptd) Yes Life alert?: (Patient-Rptd) No Use of a cane, walker or w/c?: (Patient-Rptd) No Grab bars in the bathroom?: (Patient-Rptd) Yes Shower chair or bench in shower?: (Patient-Rptd) Yes Elevated toilet seat or a handicapped toilet?: (Patient-Rptd) Yes      Cognitive Function:    06/21/2023    9:14 AM 06/24/2018    9:20 AM  MMSE - Mini Mental State Exam  Orientation to time 5 5  Orientation to Place 5 5  Registration 3 3  Attention/ Calculation 3 5  Recall 3 3  Language- name 2 objects 2 2  Language- repeat 1 1  Language- follow 3 step command 3 3  Language- read & follow direction 1 1  Write a sentence 1 1  Copy design 1 1  Total score 28 30        07/26/2023    8:38 AM 07/16/2022   10:05 AM 07/04/2021    1:36 PM 07/01/2020    1:43 PM 06/30/2019    8:50 AM  6CIT Screen  What Year? 0 points 0 points 0 points 0 points 0 points  What month? 0 points 0 points 0 points 0 points 0 points  What time? 0 points 0 points 0 points 0 points 0 points  Count back from 20 0 points 0 points 0 points  0 points  Months in reverse 0 points 0 points 0  points 0 points 0 points  Repeat phrase 0 points 0 points 0 points 2 points 0 points  Total Score 0 points 0 points 0 points  0 points    Immunizations Immunization History  Administered Date(s) Administered   Fluad Quad(high Dose 65+) 04/09/2019, 04/04/2021, 04/22/2023   Influenza Split 05/11/2014   Influenza, High Dose Seasonal PF 04/23/2017, 04/29/2018, 04/09/2019, 05/05/2020, 05/10/2022   Influenza-Unspecified 04/25/2015, 04/23/2017   PFIZER(Purple Top)SARS-COV-2 Vaccination 08/28/2019, 09/18/2019   Tdap 03/11/2013    TDAP status: Due, Education has been provided regarding the importance of this vaccine. Advised may receive this vaccine at local pharmacy or Health Dept. Aware to provide a copy of the vaccination record if obtained from local pharmacy or Health Dept. Verbalized acceptance and understanding.  Flu Vaccine status: Up to date  Pneumococcal vaccine status: Due, Education has been provided regarding the importance of this vaccine. Advised may receive this vaccine at local pharmacy or Health Dept. Aware to provide a copy of the vaccination  record if obtained from local pharmacy or Health Dept. Verbalized acceptance and understanding.  Covid-19 vaccine status: Information provided on how to obtain vaccines.   Qualifies for Shingles Vaccine? Yes   Zostavax completed No   Shingrix Completed?: No.    Education has been provided regarding the importance of this vaccine. Patient has been advised to call insurance company to determine out of pocket expense if they have not yet received this vaccine. Advised may also receive vaccine at local pharmacy or Health Dept. Verbalized acceptance and understanding.  Screening Tests Health Maintenance  Topic Date Due   Pneumonia Vaccine 46+ Years old (1 of 2 - PCV) Never done   DTaP/Tdap/Td (2 - Td or Tdap) 03/12/2023   COVID-19 Vaccine (3 - 2024-25 season) 03/31/2023   Medicare Annual Wellness (AWV)  07/17/2023   Zoster Vaccines-  Shingrix (1 of 2) 09/21/2023 (Originally [birth year]1991)   HEMOGLOBIN A1C  12/16/2023   OPHTHALMOLOGY EXAM  06/12/2024   Diabetic kidney evaluation - eGFR measurement  06/17/2024   Diabetic kidney evaluation - Urine ACR  06/17/2024   FOOT EXAM  06/20/2024   INFLUENZA VACCINE  Completed   HPV VACCINES  Aged Out    Health Maintenance  Health Maintenance Due  Topic Date Due   Pneumonia Vaccine 5+ Years old (1 of 2 - PCV) Never done   DTaP/Tdap/Td (2 - Td or Tdap) 03/12/2023   COVID-19 Vaccine (3 - 2024-25 season) 03/31/2023   Medicare Annual Wellness (AWV)  07/17/2023    Colorectal cancer screening: No longer required.   Lung Cancer Screening: (Low Dose CT Chest recommended if Age 87-80 years, 20 pack-year currently smoking OR have quit w/in 15years.) does not qualify.    Additional Screening:  Hepatitis C Screening: does not qualify; Completed NA age  Vision Screening: Recommended annual ophthalmology exams for early detection of glaucoma and other disorders of the eye. Is the patient up to date with their annual eye exam?  Yes  Who is the provider or what is the name of the office in which the patient attends annual eye exams? [facility] If pt is not established with a provider, would they like to be referred to a provider to establish care? No .   Dental Screening: Recommended annual dental exams for proper oral hygiene  Diabetic Foot Exam: Diabetic Foot Exam: Completed 05/2023  Community Resource Referral / Chronic Care Management: CRR required this visit?  No   CCM required this visit?  No     Plan:     I have personally reviewed and noted the following in the patient's chart:   Medical and social history Use of alcohol, tobacco or illicit drugs  Current medications and supplements including opioid prescriptions. Patient is not currently taking opioid prescriptions. Functional ability and status Nutritional status Physical activity Advanced directives List of  other physicians Hospitalizations, surgeries, and ER visits in previous 12 months Vitals Screenings to include cognitive, depression, and falls Referrals and appointments  In addition, I have reviewed and discussed with patient certain preventive protocols, quality metrics, and best practice recommendations. A written personalized care plan for preventive services as well as general preventive health recommendations were provided to patient.     Sydell Axon, LPN   [id number]   After Visit Summary: (MyChart) Due to this being a telephonic visit, the after visit summary with patients personalized plan was offered to patient via MyChart   Nurse Notes: None

## 2023-07-26 NOTE — Patient Instructions (Addendum)
[name] , Thank you for taking time to come for your Medicare Wellness Visit. I appreciate your ongoing commitment to your health goals. Please review the following plan we discussed and let me know if I can assist you in the future.   Referrals/Orders/Follow-Ups/Clinician Recommendations: Remember to update your Tetanus, unable to locate a record of your Pneumonia vaccine.  This is a list of the screening recommended for you and due dates:  Health Maintenance  Topic Date Due   Pneumonia Vaccine (1 of 2 - PCV) Never done   DTaP/Tdap/Td vaccine (2 - Td or Tdap) 03/12/2023   COVID-19 Vaccine (3 - 2024-25 season) 03/31/2023   Zoster (Shingles) Vaccine (1 of 2) 09/21/2023*   Hemoglobin A1C  12/16/2023   Eye exam for diabetics  06/12/2024   Yearly kidney function blood test for diabetes  06/17/2024   Yearly kidney health urinalysis for diabetes  06/17/2024   Complete foot exam   06/20/2024   Medicare Annual Wellness Visit  07/25/2024   Flu Shot  Completed   HPV Vaccine  Aged Out  *Topic was postponed. The date shown is not the original due date.    Advanced directives: (Copy Requested) Please bring a copy of your health care power of attorney and living will to the office to be added to your chart at your convenience.  Next Medicare Annual Wellness Visit scheduled for next year: Yes 07/27/24 @ 8:50

## 2023-08-05 ENCOUNTER — Other Ambulatory Visit: Payer: Self-pay | Admitting: Pulmonary Disease

## 2023-08-06 ENCOUNTER — Other Ambulatory Visit (HOSPITAL_BASED_OUTPATIENT_CLINIC_OR_DEPARTMENT_OTHER): Payer: Self-pay

## 2023-08-06 ENCOUNTER — Other Ambulatory Visit (HOSPITAL_COMMUNITY): Payer: Self-pay

## 2023-08-06 ENCOUNTER — Other Ambulatory Visit: Payer: Self-pay

## 2023-08-06 MED FILL — Fluticasone-Umeclidinium-Vilanterol AEPB 100-62.5-25 MCG/ACT: RESPIRATORY_TRACT | 90 days supply | Qty: 180 | Fill #0 | Status: AC

## 2023-08-07 ENCOUNTER — Other Ambulatory Visit (HOSPITAL_COMMUNITY): Payer: Self-pay

## 2023-08-26 ENCOUNTER — Ambulatory Visit (INDEPENDENT_AMBULATORY_CARE_PROVIDER_SITE_OTHER): Payer: PPO

## 2023-08-26 DIAGNOSIS — E538 Deficiency of other specified B group vitamins: Secondary | ICD-10-CM

## 2023-08-26 MED ORDER — CYANOCOBALAMIN 1000 MCG/ML IJ SOLN
1000.0000 ug | Freq: Once | INTRAMUSCULAR | Status: AC
  Administered 2023-08-26: 1000 ug via INTRAMUSCULAR

## 2023-08-26 NOTE — Progress Notes (Signed)
Pt presented for their vitamin B12 injection. Pt was identified through two identifiers. Pt tolerated shot well in their right deltoid.

## 2023-09-24 ENCOUNTER — Other Ambulatory Visit: Payer: Self-pay | Admitting: Internal Medicine

## 2023-09-26 ENCOUNTER — Ambulatory Visit (INDEPENDENT_AMBULATORY_CARE_PROVIDER_SITE_OTHER): Payer: PPO

## 2023-09-26 ENCOUNTER — Telehealth: Payer: Self-pay | Admitting: Pulmonary Disease

## 2023-09-26 DIAGNOSIS — E538 Deficiency of other specified B group vitamins: Secondary | ICD-10-CM | POA: Diagnosis not present

## 2023-09-26 MED ORDER — CYANOCOBALAMIN 1000 MCG/ML IJ SOLN
1000.0000 ug | Freq: Once | INTRAMUSCULAR | Status: AC
  Administered 2023-09-26: 1000 ug via INTRAMUSCULAR

## 2023-09-26 MED ORDER — ALBUTEROL SULFATE HFA 108 (90 BASE) MCG/ACT IN AERS: 1.0000 | INHALATION_SPRAY | Freq: Four times a day (QID) | RESPIRATORY_TRACT | 3 refills | Status: AC | PRN

## 2023-09-26 NOTE — Telephone Encounter (Signed)
 Per review, inhaler refilled by Dr Jayme Cloud.

## 2023-09-26 NOTE — Progress Notes (Signed)
 Patient presented for B 12 injection to left deltoid, patient voiced no concerns nor showed any signs of distress during injection.

## 2023-09-26 NOTE — Telephone Encounter (Signed)
 Pt states his albuterol expired Jan 2025. Is asking for a new one to be sent to [facility]

## 2023-09-26 NOTE — Telephone Encounter (Signed)
 Albuterol inhaler prescription sent to [facility] pharmacy. Nothing further needed.

## 2023-10-07 ENCOUNTER — Other Ambulatory Visit: Payer: Self-pay | Admitting: Cardiovascular Disease

## 2023-10-08 ENCOUNTER — Encounter: Payer: Self-pay | Admitting: Pulmonary Disease

## 2023-10-08 ENCOUNTER — Ambulatory Visit: Payer: PPO | Admitting: Pulmonary Disease

## 2023-10-08 VITALS — BP 132/58 | HR 75 | Resp 14 | Ht 65.0 in | Wt 169.4 lb

## 2023-10-08 DIAGNOSIS — J479 Bronchiectasis, uncomplicated: Secondary | ICD-10-CM

## 2023-10-08 DIAGNOSIS — Z87891 Personal history of nicotine dependence: Secondary | ICD-10-CM | POA: Diagnosis not present

## 2023-10-08 DIAGNOSIS — J4489 Other specified chronic obstructive pulmonary disease: Secondary | ICD-10-CM | POA: Diagnosis not present

## 2023-10-08 NOTE — Progress Notes (Signed)
 Subjective:    Patient ID: James Peck, male    DOB: 04/16/1941, 83 y.o.   MRN: [id number]  Patient Care Team: Dale Shakopee, MD as PCP - General (Internal Medicine) Salena Saner, MD as Consulting Physician (Pulmonary Disease)  Chief Complaint  Patient presents with   Follow-up    Asthma-COPD- breathing has been pretty good    BACKGROUND/INTERVAL: 83 year old former smoker who follows up for the issue of cough and shortness of breath.  He has known asthma/COPD overlap syndrome and bronchiectasis.  Last seen by me on 04 March 2023, in the interim seen by Ames Dura, NP on 04 June 2023 as a video visit for routine COPD follow-up.  HPI Discussed the use of AI scribe software for clinical note transcription with the patient, who gave verbal consent to proceed.  History of Present Illness   The patient, with asthma-COPD overlap syndrome, presents for follow-up.  He is currently managed on Trelegy Ellipta 100 for asthma-COPD overlap syndrome, which he finds effective. He has not experienced any recent respiratory infections or exacerbations. He is nearing the end of his current 90-day supply and has encountered cost and availability issues, noting a recent increase in copay. Previously, he obtained his medication through a local source when his usual provider faced supply issues.  He has not experienced recent chest pain or shortness of breath. However, he mentioned increased sneezing yesterday, humorously attributing it to potential allergies to his wife, indicating the onset of allergy season. Last week, he had an episode of significant exertion and used his emergency inhaler, which he had recently refilled after it ran out some time ago. He has not needed to use the inhaler frequently.  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   DATA 04/18/2020 PFTs: FEV1 1.40 L or 57% predicted, FVC 2.48 L  or 71% predicted, FEV1/FVC 57%.  There is significant bronchodilator response with 17% change in FEV1 postbronchodilator.  Moderate air trapping, mild diffusion capacity impairment, consistent with COPD/emphysema with reversible airways component. 09/12/2020 2D echo: LVEF 60 to 65%, no regional wall motion normalities.  Grade I DD. 09/12/2020 left heart cath:Insignificant coronary artery disease with patent stent proximal/mid RCA, 50% stenosis mid/distal LAD with muscle bridge (Paraschos). 12/27/2020 CT chest: Emphysema, lower lobe posterior bronchiectasis, not new volume of retained secretions in bronchus.  No lesions of concern.  Calcified atherosclerosis. 03/20/2022 chest x-ray PA and lateral: No active cardiopulmonary disease, hyperinflation consistent with COPD.   Review of Systems A 10 point review of systems was performed and it is as noted above otherwise negative.   Patient Active Problem List   Diagnosis Date Noted   Shoulder pain, left 10/23/2023   Memory change 06/21/2023   Diabetes mellitus type 2 with complications (HCC) 01/07/2023   BPH (benign prostatic hyperplasia) 11/15/2022   Asthma-COPD overlap syndrome (HCC) 01/31/2022   Bronchiectasis without complication (HCC) 01/31/2022   Hematuria 10/01/2021   History of colon polyps 08/06/2021   Hyponatremia 04/09/2021   Subclavian artery stenosis, left (HCC) 02/04/2021   Carotid artery disease (HCC) 02/04/2021   Coronary artery disease    Chest pain    NSTEMI (non-ST elevated myocardial infarction) (HCC)    COPD exacerbation (HCC)    Allergic rhinitis 04/12/2020   Emphysema, unspecified (HCC) 04/12/2020   Former smoker 04/12/2020   Lung nodules 03/27/2020   Renal lesion 03/27/2020   Abnormal abdominal CT scan 02/16/2020  Right rotator cuff tear 08/19/2019   Cough 06/30/2019   Healthcare maintenance 08/28/2018   Hyperglycemia 12/22/2017   Degenerative arthritis of left knee 06/04/2017   Degenerative tear of meniscus of  left knee 02/14/2015   Acid reflux 01/27/2015   Adenomatous colon polyp 01/27/2015   Allergy 01/27/2015   Arteriosclerosis of coronary artery 01/27/2015   Lumbar radiculopathy 01/27/2015   CAD in native artery 01/07/2014   Benign essential HTN 10/21/2013   HLD (hyperlipidemia) 10/21/2013    Social History   Tobacco Use   Smoking status: Former    Current packs/day: 0.00    Average packs/day: 1 pack/day for 40.0 years (40.0 ttl pk-yrs)    Types: Cigarettes    Start date: 20    Quit date: 2000    Years since quitting: 25.2   Smokeless tobacco: Never  Substance Use Topics   Alcohol use: No    Allergies  Allergen Reactions   Ace Inhibitors Cough   Enalapril Cough    Current Meds  Medication Sig   albuterol (VENTOLIN HFA) 108 (90 Base) MCG/ACT inhaler Inhale 1 puff into the lungs every 6 (six) hours as needed for wheezing or shortness of breath.   aspirin EC 81 MG tablet Take 81 mg by mouth daily.    calcium carbonate (OS-CAL - DOSED IN MG OF ELEMENTAL CALCIUM) 1250 (500 CA) MG tablet Take 1 tablet by mouth daily.    cetirizine (ZYRTEC) 10 MG tablet TAKE 1 TABLET BY MOUTH DAILY   CINNAMON PO Take 1,200 mg by mouth 2 times daily at 12 noon and 4 pm.   ezetimibe (ZETIA) 10 MG tablet TAKE 1 TABLET BY MOUTH DAILY.   finasteride (PROSCAR) 5 MG tablet TAKE ONE TABLET EVERY DAY   fluticasone (FLONASE) 50 MCG/ACT nasal spray TAKE 2 SPRAYS INTO BOTH NOSTRILS DAILY   Fluticasone-Umeclidin-Vilant (TRELEGY ELLIPTA) 100-62.5-25 MCG/ACT AEPB INHALE ONE PUFF INTO THE LUNGS EVERY DAY   GLUCOSAMINE-CHONDROITIN PO Take 1 capsule by mouth daily.   Misc Natural Products (BLACK CHERRY CONCENTRATE PO) Take 1,000 mg daily by mouth.   nitroGLYCERIN (NITROSTAT) 0.4 MG SL tablet Place 1 tablet (0.4 mg total) under the tongue every 5 (five) minutes as needed for chest pain.   Omega-3 Fatty Acids (FISH OIL) 1000 MG CAPS Take 1,000 mg by mouth daily.    rosuvastatin (CRESTOR) 20 MG tablet TAKE 1  TABLET BY MOUTH DAILY.   sildenafil (REVATIO) 20 MG tablet TAKE 1 TO 2 TABLETS BY MOUTH EVERY DAY AS NEEDED   telmisartan (MICARDIS) 40 MG tablet TAKE 1 TABLET BY MOUTH ONCE DAILY   [DISCONTINUED] famotidine (PEPCID) 20 MG tablet TAKE 1 TABLET BY MOUTH DAILY    Immunization History  Administered Date(s) Administered   Fluad Quad(high Dose 65+) 04/09/2019, 04/04/2021, 04/22/2023   Influenza Split 05/11/2014   Influenza, High Dose Seasonal PF 04/23/2017, 04/29/2018, 04/09/2019, 05/05/2020, 05/10/2022   Influenza-Unspecified 04/25/2015, 04/23/2017   PFIZER(Purple Top)SARS-COV-2 Vaccination 08/28/2019, 09/18/2019   Respiratory Syncytial Virus Vaccine,Recomb Aduvanted(Arexvy) 08/13/2023   Tdap 03/11/2013        Objective:     BP (!) 132/58   Pulse 75   Resp 14   Ht 5\' 5"  (1.651 m)   Wt 169 lb 6.4 oz (76.8 kg)   SpO2 94%   BMI 28.19 kg/m   SpO2: 94 %  GENERAL: Well-developed, well-nourished elderly gentleman, looks younger than stated age, in no acute distress, fully ambulatory.  No conversational dyspnea. HEAD: Normocephalic, atraumatic.  EYES: Pupils equal, round, reactive  to light.  No scleral icterus.  MOUTH: Oral mucosa moist, intact dentition. NECK: Supple. No thyromegaly. Trachea midline. No JVD.  No adenopathy. PULMONARY: Good air entry bilaterally.  Coarse, otherwise, no adventitious sounds. CARDIOVASCULAR: S1 and S2. Regular rate and rhythm.  No rubs, murmurs or gallops heard. ABDOMEN: Benign. MUSCULOSKELETAL: No joint deformity, no clubbing, no edema.  NEUROLOGIC: No focal deficit, no gait disturbance, speech is fluent. SKIN: Intact,warm,dry. PSYCH: Mood and behavior normal.   Assessment & Plan:     ICD-10-CM   1. Asthma-COPD overlap syndrome (HCC)  J44.89     2. Bronchiectasis without complication (HCC)  J47.9     3. Former smoker  Z87.891      Discussion:    Asthma-COPD overlap syndrome (ACOS) Asthma-COPD overlap syndrome is well-managed on Trelegy  Ellipta. He reports effective symptom control with no significant respiratory issues, chest pain, or dyspnea. Occasional sneezing is attributed to seasonal allergies. He remains active in yard work and [employer] business, which may expose him to dust and allergens. He has an emergency inhaler, which is infrequently used. Discussed mail order pharmacy to reduce medication costs and ensure availability. - Continue Trelegy Ellipta. - Advise wearing a bandana or mask to reduce dust exposure during outdoor activities. - Refill Trelegy Ellipta prescription through a mail order pharmacy to potentially reduce costs and ensure availability. - Schedule pulmonary function test (PFT).  Follow-up Follow-up is advised in six months unless issues arise earlier. Discussed checking with insurance for mail order pharmacy options to reduce medication costs. - Schedule follow-up appointment in six months. - Contact insurance for mail order pharmacy options.     Advised if symptoms do not improve or worsen, to please contact office for sooner follow up or seek emergency care.    I spent 22 minutes of dedicated to the care of this patient on the date of this encounter to include pre-visit review of records, face-to-face time with the patient discussing conditions above, post visit ordering of testing, clinical documentation with the electronic health record, making appropriate referrals as documented, and communicating necessary findings to members of the patients care team.     C. Danice Goltz, MD Advanced Bronchoscopy [facility]    *This note was generated using voice recognition software/Dragon and/or AI transcription program.  Despite best efforts to proofread, errors can occur which can change the meaning. Any transcriptional errors that result from this process are unintentional and may not be fully corrected at the time of dictation.

## 2023-10-08 NOTE — Patient Instructions (Signed)
 VISIT SUMMARY:  You came in today for a follow-up on your asthma-COPD overlap syndrome. You mentioned that your current medication, Trelegy Ellipta, is working well for you, but you have encountered some issues with cost and availability. You also noted some increased sneezing, likely due to seasonal allergies, and an episode of significant exertion last week where you used your emergency inhaler. 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  YOUR PLAN:  -ASTHMA-COPD OVERLAP SYNDROME (ACOS): Asthma-COPD overlap syndrome is a condition where features of both asthma and chronic obstructive pulmonary disease are present. Your symptoms are well-managed with Trelegy Ellipta, and you have not experienced significant respiratory issues. To help with the cost and availability of your medication, we discussed using a mail order pharmacy. Additionally, wearing a bandana or mask during outdoor activities can help reduce exposure to dust and allergens. We will continue your current medication and schedule a pulmonary function test for tomorrow.  INSTRUCTIONS:  Please schedule a follow-up appointment in six months. Additionally, contact your insurance to explore mail order pharmacy options to potentially reduce the cost of your medication.

## 2023-10-18 ENCOUNTER — Other Ambulatory Visit: Payer: PPO

## 2023-10-18 DIAGNOSIS — E785 Hyperlipidemia, unspecified: Secondary | ICD-10-CM

## 2023-10-18 DIAGNOSIS — E118 Type 2 diabetes mellitus with unspecified complications: Secondary | ICD-10-CM

## 2023-10-18 LAB — LIPID PANEL
Cholesterol: 116 mg/dL (ref 0–200)
HDL: 46.7 mg/dL (ref >39.00)
LDL Cholesterol: 53 mg/dL (ref 0–99)
NonHDL: 68.8
Total CHOL/HDL Ratio: 2
Triglycerides: 81 mg/dL (ref 0.0–149.0)
VLDL: 16.2 mg/dL (ref 0.0–40.0)

## 2023-10-18 LAB — BASIC METABOLIC PANEL
BUN: 13 mg/dL (ref 6–23)
CO2: 26 meq/L (ref 19–32)
Calcium: 9.7 mg/dL (ref 8.4–10.5)
Chloride: 102 meq/L (ref 96–112)
Creatinine, Ser: 0.97 mg/dL (ref 0.40–1.50)
GFR: 72.91 mL/min (ref >60.00)
Glucose, Bld: 121 mg/dL — ABNORMAL HIGH (ref 70–99)
Potassium: 4.4 meq/L (ref 3.5–5.1)
Sodium: 136 meq/L (ref 135–145)

## 2023-10-18 LAB — HEPATIC FUNCTION PANEL
ALT: 27 U/L (ref 0–53)
AST: 25 U/L (ref 0–37)
Albumin: 4.5 g/dL (ref 3.5–5.2)
Alkaline Phosphatase: 53 U/L (ref 39–117)
Bilirubin, Direct: 0.2 mg/dL (ref 0.0–0.3)
Total Bilirubin: 0.8 mg/dL (ref 0.2–1.2)
Total Protein: 6.9 g/dL (ref 6.0–8.3)

## 2023-10-18 LAB — HEMOGLOBIN A1C: Hgb A1c MFr Bld: 6.5 % (ref 4.6–6.5)

## 2023-10-23 ENCOUNTER — Other Ambulatory Visit (HOSPITAL_COMMUNITY): Payer: Self-pay

## 2023-10-23 ENCOUNTER — Ambulatory Visit (INDEPENDENT_AMBULATORY_CARE_PROVIDER_SITE_OTHER): Payer: PPO | Admitting: Internal Medicine

## 2023-10-23 ENCOUNTER — Other Ambulatory Visit: Payer: Self-pay

## 2023-10-23 VITALS — BP 112/70 | HR 77 | Temp 98.2°F | Resp 16 | Ht 67.0 in | Wt 166.0 lb

## 2023-10-23 DIAGNOSIS — I779 Disorder of arteries and arterioles, unspecified: Secondary | ICD-10-CM | POA: Diagnosis not present

## 2023-10-23 DIAGNOSIS — E538 Deficiency of other specified B group vitamins: Secondary | ICD-10-CM | POA: Diagnosis not present

## 2023-10-23 DIAGNOSIS — R319 Hematuria, unspecified: Secondary | ICD-10-CM | POA: Diagnosis not present

## 2023-10-23 DIAGNOSIS — J439 Emphysema, unspecified: Secondary | ICD-10-CM | POA: Diagnosis not present

## 2023-10-23 DIAGNOSIS — I10 Essential (primary) hypertension: Secondary | ICD-10-CM

## 2023-10-23 DIAGNOSIS — E785 Hyperlipidemia, unspecified: Secondary | ICD-10-CM | POA: Diagnosis not present

## 2023-10-23 DIAGNOSIS — I2510 Atherosclerotic heart disease of native coronary artery without angina pectoris: Secondary | ICD-10-CM

## 2023-10-23 DIAGNOSIS — J479 Bronchiectasis, uncomplicated: Secondary | ICD-10-CM

## 2023-10-23 DIAGNOSIS — E118 Type 2 diabetes mellitus with unspecified complications: Secondary | ICD-10-CM

## 2023-10-23 DIAGNOSIS — I771 Stricture of artery: Secondary | ICD-10-CM | POA: Diagnosis not present

## 2023-10-23 DIAGNOSIS — Z860100 Personal history of colon polyps, unspecified: Secondary | ICD-10-CM

## 2023-10-23 DIAGNOSIS — J4489 Other specified chronic obstructive pulmonary disease: Secondary | ICD-10-CM | POA: Diagnosis not present

## 2023-10-23 DIAGNOSIS — M25512 Pain in left shoulder: Secondary | ICD-10-CM | POA: Diagnosis not present

## 2023-10-23 MED ORDER — FAMOTIDINE 20 MG PO TABS: 20.0000 mg | ORAL_TABLET | Freq: Every day | ORAL | 3 refills | Status: DC

## 2023-10-23 MED ORDER — CYANOCOBALAMIN 1000 MCG/ML IJ SOLN
1000.0000 ug | Freq: Once | INTRAMUSCULAR | Status: AC
  Administered 2023-10-23: 1000 ug via INTRAMUSCULAR

## 2023-10-23 MED FILL — Fluticasone-Umeclidinium-Vilanterol AEPB 100-62.5-25 MCG/ACT: RESPIRATORY_TRACT | 90 days supply | Qty: 180 | Fill #1 | Status: AC

## 2023-10-23 NOTE — Assessment & Plan Note (Signed)
 Evaluated by Dr Lonna Cobb - 09/2021 - recommended CT urogram and cystoscopy.  Cysto (10/30/21) - No bladder mucosal abnormalities. Hematuria most likely secondary to BPH. Started finasteride 5 mg daily. Follow-up 1 year or earlier for recurrent hematuria. Had f/u with urology - 10/2022 - off plavix.  No recurring episodes of hematuria.  No changes made.  Recommended f/u in one year.

## 2023-10-23 NOTE — Assessment & Plan Note (Signed)
 Saw [facility] 01/24/23 - Carotid Duplex done today shows <40%.  No change compared to last study in 2023. Vertebral arteries were well-visualized today with no evidence of stenosis and normal flow hemodynamics in the bilateral subclavian arteries. Recommended continued antiplatelet therapy and f/u 12 months.

## 2023-10-23 NOTE — Progress Notes (Unsigned)
 Subjective:    Patient ID: James Peck, male    DOB: 22-Jan-1941, 83 y.o.   MRN: [id number]  Patient here for  Chief Complaint  Patient presents with  . Medical Management of Chronic Issues    HPI Here for a scheduled follow up - follow up regarding hypercholesterolemia, hypertension and diabetes. Saw pulmonary 10/08/23 - f/u asthma/COPD overlap syndrome and bronchiectasis. On trelegy and doing well. Breathing stable. Some allergy symptoms.    Past Medical History:  Diagnosis Date  . Allergy   . Arthritis   . Coronary artery disease   . GERD (gastroesophageal reflux disease)   . History of hiatal hernia   . Hypercholesteremia   . Hypertension    Past Surgical History:  Procedure Laterality Date  . CARDIAC CATHETERIZATION    . cardiac stents    . CATARACT EXTRACTION W/PHACO Right 09/10/2017   Procedure: CATARACT EXTRACTION PHACO AND INTRAOCULAR LENS PLACEMENT (IOC);  Surgeon: Galen Manila, MD;  Location: [facility] ORS;  Service: Ophthalmology;  Laterality: Right;  [place] 01:01.7 AP% 15.3 CDE 9.38 Fluid pack lot # [id number]  . CATARACT EXTRACTION W/PHACO Left 10/02/2017   Procedure: CATARACT EXTRACTION PHACO AND INTRAOCULAR LENS PLACEMENT (IOC);  Surgeon: Galen Manila, MD;  Location: [facility] ORS;  Service: Ophthalmology;  Laterality: Left;  [place] 00:42.3 AP% 16.2 CDE 6.85 Fluid Pack Lot # [id number]  . COLONOSCOPY WITH PROPOFOL N/A 06/28/2017   Procedure: COLONOSCOPY WITH PROPOFOL;  Surgeon: Scot Jun, MD;  Location: [facility];  Service: Endoscopy;  Laterality: N/A;  . COLONOSCOPY WITH PROPOFOL N/A 12/12/2021   Procedure: COLONOSCOPY WITH PROPOFOL;  Surgeon: Regis Bill, MD;  Location: [facility] ENDOSCOPY;  Service: Endoscopy;  Laterality: N/A;  . CORONARY ANGIOPLASTY     STENTS X 2  . EYE SURGERY    . KNEE ARTHROSCOPY    . LEFT HEART CATH AND CORONARY ANGIOGRAPHY N/A 09/12/2020   Procedure: LEFT HEART CATH AND CORONARY ANGIOGRAPHY;  Surgeon: Marcina Millard,  MD;  Location: [facility] INVASIVE CV [facility];  Service: Cardiovascular;  Laterality: N/A;   Family History  Problem Relation Age of Onset  . Heart disease Mother   . Diabetes Mother   . Heart disease Father   . Diabetes Father   . Alzheimer's disease Brother   . Stroke Brother    Social History   Socioeconomic History  . Marital status: Married    Spouse name: Not on file  . Number of children: Not on file  . Years of education: Not on file  . Highest education level: 12th grade  Occupational History  . Not on file  Tobacco Use  . Smoking status: Former    Current packs/day: 0.00    Average packs/day: 1 pack/day for 40.0 years (40.0 ttl pk-yrs)    Types: Cigarettes    Start date: 41    Quit date: 2000    Years since quitting: 25.2  . Smokeless tobacco: Never  Vaping Use  . Vaping status: Never Used  Substance and Sexual Activity  . Alcohol use: No  . Drug use: No  . Sexual activity: Not on file  Other Topics Concern  . Not on file  Social History Narrative   Married   Social Drivers of Health   Financial Resource Strain: Low Risk  (10/21/2023)   Overall Financial Resource Strain (CARDIA)   . Difficulty of Paying Living Expenses: Not hard at all  Food Insecurity: No Food Insecurity (10/21/2023)   Hunger Vital Sign   . Worried  About Running Out of Food in the Last Year: Never true   . Ran Out of Food in the Last Year: Never true  Transportation Needs: No Transportation Needs (10/21/2023)   PRAPARE - Transportation   . Lack of Transportation (Medical): No   . Lack of Transportation (Non-Medical): No  Physical Activity: Insufficiently Active (10/21/2023)   Exercise Vital Sign   . Days of Exercise per Week: 3 days   . Minutes of Exercise per Session: 30 min  Stress: No Stress Concern Present (10/21/2023)   [employer] of Occupational Health - Occupational Stress Questionnaire   . Feeling of Stress : Not at all  Social Connections: Socially Integrated (10/21/2023)    Social Connection and Isolation Panel [NHANES]   . Frequency of Communication with Friends and Family: More than three times a week   . Frequency of Social Gatherings with Friends and Family: More than three times a week   . Attends Religious Services: More than 4 times per year   . Active Member of Clubs or Organizations: Yes   . Attends Banker Meetings: More than 4 times per year   . Marital Status: Married     Review of Systems     Objective:     BP 112/70   Pulse 77   Temp 98.2 F (36.8 C)   Resp 16   Ht 5\' 7"  (1.702 m)   Wt 166 lb (75.3 kg)   SpO2 99%   BMI 26.00 kg/m  Wt Readings from Last 3 Encounters:  10/23/23 166 lb (75.3 kg)  10/08/23 169 lb 6.4 oz (76.8 kg)  07/26/23 160 lb (72.6 kg)    Physical Exam  {Perform Simple Foot Exam  Perform Detailed exam:1} {Insert foot Exam (Optional):30965}   Outpatient Encounter Medications as of 10/23/2023  Medication Sig  . albuterol (VENTOLIN HFA) 108 (90 Base) MCG/ACT inhaler Inhale 1 puff into the lungs every 6 (six) hours as needed for wheezing or shortness of breath.  Marland Kitchen aspirin EC 81 MG tablet Take 81 mg by mouth daily.   . calcium carbonate (OS-CAL - DOSED IN MG OF ELEMENTAL CALCIUM) 1250 (500 CA) MG tablet Take 1 tablet by mouth daily.   . cetirizine (ZYRTEC) 10 MG tablet TAKE 1 TABLET BY MOUTH DAILY  . CINNAMON PO Take 1,200 mg by mouth 2 times daily at 12 noon and 4 pm.  . ezetimibe (ZETIA) 10 MG tablet TAKE 1 TABLET BY MOUTH DAILY.  . famotidine (PEPCID) 20 MG tablet TAKE 1 TABLET BY MOUTH DAILY  . finasteride (PROSCAR) 5 MG tablet TAKE ONE TABLET EVERY DAY  . fluticasone (FLONASE) 50 MCG/ACT nasal spray TAKE 2 SPRAYS INTO BOTH NOSTRILS DAILY  . Fluticasone-Umeclidin-Vilant (TRELEGY ELLIPTA) 100-62.5-25 MCG/ACT AEPB INHALE ONE PUFF INTO THE LUNGS EVERY DAY  . GLUCOSAMINE-CHONDROITIN PO Take 1 capsule by mouth daily.  . Misc Natural Products (BLACK CHERRY CONCENTRATE PO) Take 1,000 mg daily by  mouth.  . nitroGLYCERIN (NITROSTAT) 0.4 MG SL tablet Place 1 tablet (0.4 mg total) under the tongue every 5 (five) minutes as needed for chest pain.  . Omega-3 Fatty Acids (FISH OIL) 1000 MG CAPS Take 1,000 mg by mouth daily.   . rosuvastatin (CRESTOR) 20 MG tablet TAKE 1 TABLET BY MOUTH DAILY.  . sildenafil (REVATIO) 20 MG tablet TAKE 1 TO 2 TABLETS BY MOUTH EVERY DAY AS NEEDED  . telmisartan (MICARDIS) 40 MG tablet TAKE 1 TABLET BY MOUTH ONCE DAILY  . [EXPIRED] cyanocobalamin (VITAMIN B12) injection  1,000 mcg    No facility-administered encounter medications on file as of 10/23/2023.     Lab Results  Component Value Date   WBC 7.4 06/21/2023   HGB 15.3 06/21/2023   HCT 46.2 06/21/2023   PLT 300.0 06/21/2023   GLUCOSE 121 (H) 10/18/2023   CHOL 116 10/18/2023   TRIG 81.0 10/18/2023   HDL 46.70 10/18/2023   LDLCALC 53 10/18/2023   ALT 27 10/18/2023   AST 25 10/18/2023   NA 136 10/18/2023   K 4.4 10/18/2023   CL 102 10/18/2023   CREATININE 0.97 10/18/2023   BUN 13 10/18/2023   CO2 26 10/18/2023   TSH 2.46 02/13/2023   PSA 0.70 06/21/2023   INR 1.0 09/11/2020   HGBA1C 6.5 10/18/2023   MICROALBUR 1.2 06/18/2023    DG Chest 2 View Result Date: 03/22/2022 CLINICAL DATA:  Persistent cough and congestion EXAM: CHEST - 2 VIEW COMPARISON:  Chest x-ray dated 09/11/2020 FINDINGS: Heart size and mediastinal contours are within normal limits. Lungs are hyperexpanded. Chronic bronchitic changes noted centrally. No pulmonary nodule or mass is seen. No confluent airspace opacity to suggest a developing pneumonia. No pleural effusion or pneumothorax is seen. No acute-appearing osseous abnormality. IMPRESSION: 1. No active cardiopulmonary disease. No evidence of pneumonia or pulmonary edema. 2. Hyperexpanded lungs indicating COPD. Electronically Signed   By: Bary Richard M.D.   On: 03/22/2022 08:31       Assessment & Plan:  Subclavian artery stenosis, left [facility]) Assessment & Plan: Saw [facility]  01/24/23 - Carotid Duplex done today shows <40%.  No change compared to last study in 2023. Vertebral arteries were well-visualized today with no evidence of stenosis and normal flow hemodynamics in the bilateral subclavian arteries. Recommended continued antiplatelet therapy and f/u 12 months.    Hyperlipidemia, unspecified hyperlipidemia type  Diabetes mellitus type 2 with complications (HCC)  Benign essential HTN  Hematuria, unspecified type Assessment & Plan: Evaluated by Dr Lonna Cobb - 09/2021 - recommended CT urogram and cystoscopy.  Cysto (10/30/21) - No bladder mucosal abnormalities. Hematuria most likely secondary to BPH. Started finasteride 5 mg daily. Follow-up 1 year or earlier for recurrent hematuria. Had f/u with urology - 10/2022 - off plavix.  No recurring episodes of hematuria.  No changes made.  Recommended f/u in one year.    B12 deficiency -     Cyanocobalamin     Dale Pollard, MD

## 2023-10-24 ENCOUNTER — Encounter: Payer: Self-pay | Admitting: Internal Medicine

## 2023-10-24 NOTE — Assessment & Plan Note (Signed)
 Established with pulmonary.  Continue trelegy.   Breathing overall improved on current regimen. No changes today.

## 2023-10-24 NOTE — Assessment & Plan Note (Signed)
 Has known CAD.  Seeing Dr Mariah Milling. Continue risk factor modification.  Currently without acute symptoms.  Remain on crestor and Dr Mariah Milling added zetia.

## 2023-10-24 NOTE — Assessment & Plan Note (Signed)
 Low carb diet and exercise.  Follow met b and A1c. On no medication.  Discussed A1c stable.  Lab Results  Component Value Date   HGBA1C 6.5 10/18/2023

## 2023-10-24 NOTE — Assessment & Plan Note (Signed)
 [facility] 12/2022 - Duplex ultrasound shows <40% stenosis bilaterally. F/u 12 months. Continue risk factor modification.

## 2023-10-24 NOTE — Assessment & Plan Note (Signed)
 Seeing - Dr Jayme Cloud - continue trelegy and prn albuterol. Breathing stable.

## 2023-10-24 NOTE — Assessment & Plan Note (Signed)
Colonoscopy 12/12/21:   RECTAL POLYP; HOT SNARE:  - TUBULAR ADENOMA WITH MUCOSAL PROLAPSE TYPE CHANGES.  - CAUTERIZED POLYP BASE FREE OF DYSPLASIA.  - NEGATIVE FOR HIGH-GRADE DYSPLASIA AND MALIGNANCY.   COLON POLYPS X2, ASCENDING; COLD BIOPSY AND COLD SNARE:  - MULTIPLE FRAGMENTS OF TUBULAR ADENOMAS.  - NEGATIVE FOR HIGH-GRADE DYSPLASIA AND MALIGNANCY.

## 2023-10-24 NOTE — Assessment & Plan Note (Signed)
 Persistent left shoulder pain as outlined. No injury or trauma. Exam as outlined. Discussed further evaluation and w/up. Will start with PT. Follow.

## 2023-10-24 NOTE — Assessment & Plan Note (Signed)
 Continues trelegy.  Has albuterol to take prn.  Breathing stable.

## 2023-10-24 NOTE — Assessment & Plan Note (Signed)
 Continue micardis 40mg  q day.  Follow pressures.  Follow metabolic panel. No changes made today.

## 2023-10-24 NOTE — Assessment & Plan Note (Signed)
 On crestor and zetia.  Low cholesterol diet and exercise.  Follow lipid panel and liver function tests. No changes today.  Lab Results  Component Value Date   CHOL 116 10/18/2023   HDL 46.70 10/18/2023   LDLCALC 53 10/18/2023   TRIG 81.0 10/18/2023   CHOLHDL 2 10/18/2023

## 2023-10-29 ENCOUNTER — Ambulatory Visit: Admitting: Physical Therapy

## 2023-10-31 ENCOUNTER — Ambulatory Visit: Admitting: Physical Therapy

## 2023-10-31 ENCOUNTER — Ambulatory Visit: Attending: Internal Medicine | Admitting: Physical Therapy

## 2023-10-31 DIAGNOSIS — M25512 Pain in left shoulder: Secondary | ICD-10-CM | POA: Diagnosis not present

## 2023-10-31 NOTE — Therapy (Unsigned)
 OUTPATIENT PHYSICAL THERAPY SHOULDER EVALUATION   Patient Name: James Peck MRN: [id number] DOB:09-21-1940, 83 y.o., male Today's Date: 11/01/2023  END OF SESSION:  PT End of Session - 11/01/23 1428     Visit Number 1    Number of Visits 20    Date for PT Re-Evaluation 01/09/24    Authorization Type [id number]    Authorization - Visit Number 1    Authorization - Number of Visits 20    Progress Note Due on Visit 10    PT Start Time 1430    PT Stop Time 1515    PT Time Calculation (min) 45 min    Activity Tolerance Patient tolerated treatment well    Behavior During Therapy WFL for tasks assessed/performed             Past Medical History:  Diagnosis Date   Allergy    Arthritis    Coronary artery disease    GERD (gastroesophageal reflux disease)    History of hiatal hernia    Hypercholesteremia    Hypertension    Past Surgical History:  Procedure Laterality Date   CARDIAC CATHETERIZATION     cardiac stents     CATARACT EXTRACTION W/PHACO Right 09/10/2017   Procedure: CATARACT EXTRACTION PHACO AND INTRAOCULAR LENS PLACEMENT (IOC);  Surgeon: Galen Manila, MD;  Location: [facility] ORS;  Service: Ophthalmology;  Laterality: Right;  [place] 01:01.7 AP% 15.3 CDE 9.38 Fluid pack lot # [id number]   CATARACT EXTRACTION W/PHACO Left 10/02/2017   Procedure: CATARACT EXTRACTION PHACO AND INTRAOCULAR LENS PLACEMENT (IOC);  Surgeon: Galen Manila, MD;  Location: [facility];  Service: Ophthalmology;  Laterality: Left;  [place] 00:42.3 AP% 16.2 CDE 6.85 Fluid Pack Lot # [id number]   COLONOSCOPY WITH PROPOFOL N/A 06/28/2017   Procedure: COLONOSCOPY WITH PROPOFOL;  Surgeon: Scot Jun, MD;  Location: [facility] ENDOSCOPY;  Service: Endoscopy;  Laterality: N/A;   COLONOSCOPY WITH PROPOFOL N/A 12/12/2021   Procedure: COLONOSCOPY WITH PROPOFOL;  Surgeon: Regis Bill, MD;  Location: [facility] ENDOSCOPY;  Service: Endoscopy;  Laterality: N/A;   CORONARY ANGIOPLASTY     STENTS X 2   EYE SURGERY      KNEE ARTHROSCOPY     LEFT HEART CATH AND CORONARY ANGIOGRAPHY N/A 09/12/2020   Procedure: LEFT HEART CATH AND CORONARY ANGIOGRAPHY;  Surgeon: Marcina Millard, MD;  Location: [facility] INVASIVE CV LAB;  Service: Cardiovascular;  Laterality: N/A;   Patient Active Problem List   Diagnosis Date Noted   Shoulder pain, left 10/23/2023   Memory change 06/21/2023   Diabetes mellitus type 2 with complications (HCC) 01/07/2023   BPH (benign prostatic hyperplasia) 11/15/2022   Asthma-COPD overlap syndrome (HCC) 01/31/2022   Bronchiectasis without complication (HCC) 01/31/2022   Hematuria 10/01/2021   History of colon polyps 08/06/2021   Hyponatremia 04/09/2021   Subclavian artery stenosis, left (HCC) 02/04/2021   Carotid artery disease (HCC) 02/04/2021   Coronary artery disease    Chest pain    NSTEMI (non-ST elevated myocardial infarction) (HCC)    COPD exacerbation (HCC)    Allergic rhinitis 04/12/2020   Emphysema, unspecified (HCC) 04/12/2020   Former smoker 04/12/2020   Lung nodules 03/27/2020   Renal lesion 03/27/2020   Abnormal abdominal CT scan 02/16/2020   Right rotator cuff tear 08/19/2019   Cough 06/30/2019   Healthcare maintenance 08/28/2018   Hyperglycemia 12/22/2017   Degenerative arthritis of left knee 06/04/2017   Degenerative tear of meniscus of left knee 02/14/2015   Acid reflux 01/27/2015  Adenomatous colon polyp 01/27/2015   Allergy 01/27/2015   Arteriosclerosis of coronary artery 01/27/2015   Lumbar radiculopathy 01/27/2015   CAD in native artery 01/07/2014   Benign essential HTN 10/21/2013   HLD (hyperlipidemia) 10/21/2013    PCP: Dr. Dale Ansley    REFERRING PROVIDER: Dr. Dale Harvey    REFERRING DIAG: [phone] (ICD-10-CM) - Left shoulder pain, unspecified chronicity  THERAPY DIAG:  Acute pain of left shoulder  Rationale for Evaluation and Treatment: Rehabilitation  ONSET DATE: 09/30/23  SUBJECTIVE:                                                                                                                                                                                       SUBJECTIVE STATEMENT: See pertinent history  Hand dominance: Right  PERTINENT HISTORY: Pt reports that he started experiencing left shoulder pain last month that developed insidiously. He is not sure what caused it and he experiences most of his pain when driving and resting his left arm. 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   PAIN:  Are you having pain? Yes: NPRS scale: 1-2/10 Pain location: Anterior surface of left glenohumeral joint  Pain description: Achy  Aggravating factors: Putting weight on it like resting on car door  Relieving factors: Blue rub  PRECAUTIONS: None  RED FLAGS: None   WEIGHT BEARING RESTRICTIONS: No  FALLS:  Has patient fallen in last 6 months? No  LIVING ENVIRONMENT: Lives with: lives with their spouse Lives in: House/apartment Stairs: Yes: Internal: 13 steps; on right going up, on left going up, and can reach both Has following equipment at home: None  OCCUPATION: Educational psychologist    PLOF: Independent  PATIENT GOALS: To feel less left shoulder pain  NEXT MD VISIT: Not sure   OBJECTIVE:  Note: Objective measures were completed at Evaluation unless otherwise noted.  VITALS BP 162/77  HR 60 SpO2 96%  DIAGNOSTIC FINDINGS:  None listed    PATIENT SURVEYS:  Quick Dash NT   COGNITION: Overall cognitive status: Within functional limits for tasks assessed     SENSATION: WFL  POSTURE: Rounded shoulders    UPPER EXTREMITY ROM:   Active ROM Right eval Left eval  Shoulder flexion 180 180  Shoulder extension    Shoulder abduction 180 180*  Shoulder adduction    Shoulder internal rotation    Shoulder external rotation    Elbow flexion    Elbow extension    Wrist flexion    Wrist extension    Wrist ulnar deviation    Wrist radial deviation    Wrist pronation  Wrist supination    (Blank rows = not tested)   UPPER EXTREMITY MMT:  MMT Right eval Left eval  Shoulder flexion 4+ 4+  Shoulder extension 4 4  Shoulder abduction 4+ 4+  Shoulder adduction    Shoulder internal rotation 4+ 4+  Shoulder external rotation 4+ 4+*  Middle trapezius 4 4  Lower trapezius 4- 4-  Elbow flexion    Elbow extension    Wrist flexion    Wrist extension    Wrist ulnar deviation    Wrist radial deviation    Wrist pronation    Wrist supination    Grip strength (lbs)    (Blank rows = not tested)  SHOULDER SPECIAL TESTS: Impingement tests: Neer impingement test: positive , Hawkins/Kennedy impingement test: negative, and Painful arc test: positive  Rotator cuff assessment: Drop arm test: negative, Empty can test: positive , and Infraspinatus test: negative Biceps assessment: Speed's test: positive   JOINT MOBILITY TESTING:  NT  PALPATION:  Anterior portion of left shoulder TTP                                                                                                                              TREATMENT DATE:  10/31/23: Eccentric left shoulder flexion with yellow band 2 x 10    PATIENT EDUCATION: Education details: Form and technique and explanation of shoulder impingement and biceps tendinitis  Person educated: Patient Education method: Programmer, multimedia, Demonstration, Verbal cues, and Handouts Education comprehension: verbalized understanding, returned demonstration, and verbal cues required  HOME EXERCISE PROGRAM: Access Code: [email or web address] URL: [email or web address] Date: 10/31/2023 Prepared by: Ellin Goodie  Exercises - Standing Shoulder Flexion with Resistance  - 3-4 x weekly - 3 sets - 10 reps  Patient Education - Shoulder Impingement  ASSESSMENT:  CLINICAL IMPRESSION: Patient is a 83 y.o. white male who was seen today for physical therapy evaluation and treatment for left shoulder pain. He has signs and symptoms that  indicate his left shoulder pain is stemming from shoulder impingement and bicep's tendinitis and potentially teres minor tendinitis. He shows deficits that include decreased left shoulder strength, postural dysfunction, and increased left shoulder pain. He will benefit from skilled PT to address the aforementioned deficits to complete activities like driving and reaching for objects at work without pain and discomfort.   OBJECTIVE IMPAIRMENTS: decreased strength, impaired UE functional use, postural dysfunction, and pain.   ACTIVITY LIMITATIONS: carrying, lifting, and reach over head  PARTICIPATION LIMITATIONS: driving, occupation, and yard work  PERSONAL FACTORS: 3+ comorbidities: HTN, T2DM, and COPD are also affecting patient's functional outcome.   REHAB POTENTIAL: Good  CLINICAL DECISION MAKING: Stable/uncomplicated  EVALUATION COMPLEXITY: Low   GOALS: Goals reviewed with patient? No  SHORT TERM GOALS: Target date: 11/15/2023  Patient will demonstrate undestanding of home exercise plan by performing exercises correctly with evidence of good carry over with min to no verbal or tactile cues .   Baseline: NT  Goal status: INITIAL  LONG TERM GOALS: Target date: 01/10/2024  Patient will improve left shoulder function as evidenced by an improvement in QuickDash score of >=10 pts to return to using LUE more reliably for driving and work related tasks. Baseline: NT  Goal status: INITIAL  2. Patient will exhibit symmetrical left shoulder and periscapular strength to right shoulder strength for improved UE function to perform driving and lifting tasks without pain or discomfort  Baseline: Mid Trap R/L 4/4, Lower Trap R/L 4-/4-, Shoulder Extension R/L 4/4  Goal status: INITIAL    PLAN:  PT FREQUENCY: 1-2x/week  PT DURATION: 10 weeks  PLANNED INTERVENTIONS: 97164- PT Re-evaluation, 97110-Therapeutic exercises, 97530- Therapeutic activity, 97112- Neuromuscular re-education,  97535- Self Care, 78295- Manual therapy, [id number]- Aquatic Therapy, A2130- Electrical stimulation (unattended), [id number]- Electrical stimulation (manual), Patient/Family education, Dry Needling, Joint mobilization, Joint manipulation, Spinal manipulation, Spinal mobilization, DME instructions, Cryotherapy, Moist heat, and Biofeedback  PLAN FOR NEXT SESSION: Take QuickDash and reintroduce periscapular exercises: OMEGA rows, shoulder horizontal abduction, D2 PNF flexion and extension.    Ellin Goodie PT, DPT  [facility] [address] [place], [address] Phone: [phone]   Fax:  [phone]

## 2023-11-05 ENCOUNTER — Ambulatory Visit: Admitting: Physical Therapy

## 2023-11-05 DIAGNOSIS — M25512 Pain in left shoulder: Secondary | ICD-10-CM | POA: Diagnosis not present

## 2023-11-05 NOTE — Therapy (Signed)
 OUTPATIENT PHYSICAL THERAPY SHOULDER TREATMENT    Patient Name: James Peck MRN: [id number] DOB:1940-08-31, 83 y.o., male Today's Date: 11/05/2023  END OF SESSION:  PT End of Session - 11/05/23 1307     Visit Number 2    Number of Visits 20    Date for PT Re-Evaluation 01/09/24    Authorization Type HTA [id number]    Authorization - Visit Number 2    Authorization - Number of Visits 20    Progress Note Due on Visit 10    PT Start Time 1305    PT Stop Time 1345    PT Time Calculation (min) 40 min    Activity Tolerance Patient tolerated treatment well    Behavior During Therapy WFL for tasks assessed/performed             Past Medical History:  Diagnosis Date   Allergy    Arthritis    Coronary artery disease    GERD (gastroesophageal reflux disease)    History of hiatal hernia    Hypercholesteremia    Hypertension    Past Surgical History:  Procedure Laterality Date   CARDIAC CATHETERIZATION     cardiac stents     CATARACT EXTRACTION W/PHACO Right 09/10/2017   Procedure: CATARACT EXTRACTION PHACO AND INTRAOCULAR LENS PLACEMENT (IOC);  Surgeon: Galen Manila, MD;  Location: [facility] ORS;  Service: Ophthalmology;  Laterality: Right;  [place] 01:01.7 AP% 15.3 CDE 9.38 Fluid pack lot # [id number]   CATARACT EXTRACTION W/PHACO Left 10/02/2017   Procedure: CATARACT EXTRACTION PHACO AND INTRAOCULAR LENS PLACEMENT (IOC);  Surgeon: Galen Manila, MD;  Location: [facility] ORS;  Service: Ophthalmology;  Laterality: Left;  [place] 00:42.3 AP% 16.2 CDE 6.85 Fluid Pack Lot # [id number]   COLONOSCOPY WITH PROPOFOL N/A 06/28/2017   Procedure: COLONOSCOPY WITH PROPOFOL;  Surgeon: Scot Jun, MD;  Location: [facility] ENDOSCOPY;  Service: Endoscopy;  Laterality: N/A;   COLONOSCOPY WITH PROPOFOL N/A 12/12/2021   Procedure: COLONOSCOPY WITH PROPOFOL;  Surgeon: Regis Bill, MD;  Location: [facility] ENDOSCOPY;  Service: Endoscopy;  Laterality: N/A;   CORONARY ANGIOPLASTY     STENTS X 2   EYE SURGERY      KNEE ARTHROSCOPY     LEFT HEART CATH AND CORONARY ANGIOGRAPHY N/A 09/12/2020   Procedure: LEFT HEART CATH AND CORONARY ANGIOGRAPHY;  Surgeon: Marcina Millard, MD;  Location: [facility] INVASIVE CV LAB;  Service: Cardiovascular;  Laterality: N/A;   Patient Active Problem List   Diagnosis Date Noted   Shoulder pain, left 10/23/2023   Memory change 06/21/2023   Diabetes mellitus type 2 with complications (HCC) 01/07/2023   BPH (benign prostatic hyperplasia) 11/15/2022   Asthma-COPD overlap syndrome (HCC) 01/31/2022   Bronchiectasis without complication (HCC) 01/31/2022   Hematuria 10/01/2021   History of colon polyps 08/06/2021   Hyponatremia 04/09/2021   Subclavian artery stenosis, left (HCC) 02/04/2021   Carotid artery disease (HCC) 02/04/2021   Coronary artery disease    Chest pain    NSTEMI (non-ST elevated myocardial infarction) (HCC)    COPD exacerbation (HCC)    Allergic rhinitis 04/12/2020   Emphysema, unspecified (HCC) 04/12/2020   Former smoker 04/12/2020   Lung nodules 03/27/2020   Renal lesion 03/27/2020   Abnormal abdominal CT scan 02/16/2020   Right rotator cuff tear 08/19/2019   Cough 06/30/2019   Healthcare maintenance 08/28/2018   Hyperglycemia 12/22/2017   Degenerative arthritis of left knee 06/04/2017   Degenerative tear of meniscus of left knee 02/14/2015   Acid reflux 01/27/2015  Adenomatous colon polyp 01/27/2015   Allergy 01/27/2015   Arteriosclerosis of coronary artery 01/27/2015   Lumbar radiculopathy 01/27/2015   CAD in native artery 01/07/2014   Benign essential HTN 10/21/2013   HLD (hyperlipidemia) 10/21/2013    PCP: Dr. Dale [place]    REFERRING PROVIDER: Dr. Dale [place]    REFERRING DIAG: [phone] (ICD-10-CM) - Left shoulder pain, unspecified chronicity  THERAPY DIAG:  Acute pain of left shoulder  Rationale for Evaluation and Treatment: Rehabilitation  ONSET DATE: 09/30/23  SUBJECTIVE:                                                                                                                                                                                       SUBJECTIVE STATEMENT:  Pt reports that he has not felt as much pain in his left shoulder as he normally has.   Hand dominance: Right  PERTINENT HISTORY: Pt reports that he started experiencing left shoulder pain last month that developed insidiously. He is not sure what caused it and he experiences most of his pain when driving and resting his left arm. 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   PAIN:  Are you having pain? Yes: NPRS scale: 1-2/10 Pain location: Anterior surface of left glenohumeral joint  Pain description: Achy  Aggravating factors: Putting weight on it like resting on car door  Relieving factors: Blue rub  PRECAUTIONS: None  RED FLAGS: None   WEIGHT BEARING RESTRICTIONS: No  FALLS:  Has patient fallen in last 6 months? No  LIVING ENVIRONMENT: Lives with: lives with their spouse Lives in: House/apartment Stairs: Yes: Internal: 13 steps; on right going up, on left going up, and can reach both Has following equipment at home: None  OCCUPATION: Educational psychologist    PLOF: Independent  PATIENT GOALS: To feel less left shoulder pain  NEXT MD VISIT: Not sure   OBJECTIVE:  Note: Objective measures were completed at Evaluation unless otherwise noted.  VITALS BP 162/77  HR 60 SpO2 96%  DIAGNOSTIC FINDINGS:  None listed    PATIENT SURVEYS:  Quick Dash 18.2%  COGNITION: Overall cognitive status: Within functional limits for tasks assessed     SENSATION: WFL  POSTURE: Rounded shoulders    UPPER EXTREMITY ROM:   Active ROM Right eval Left eval  Shoulder flexion 180 180  Shoulder extension    Shoulder abduction 180 180*  Shoulder adduction    Shoulder internal rotation    Shoulder external rotation    Elbow flexion    Elbow extension    Wrist flexion    Wrist extension  Wrist ulnar deviation    Wrist radial deviation    Wrist pronation    Wrist supination    (Blank rows = not tested)   UPPER EXTREMITY MMT:  MMT Right eval Left eval  Shoulder flexion 4+ 4+  Shoulder extension 4 4  Shoulder abduction 4+ 4+  Shoulder adduction    Shoulder internal rotation 4+ 4+  Shoulder external rotation 4+ 4+*  Middle trapezius 4 4  Lower trapezius 4- 4-  Elbow flexion    Elbow extension    Wrist flexion    Wrist extension    Wrist ulnar deviation    Wrist radial deviation    Wrist pronation    Wrist supination    Grip strength (lbs)    (Blank rows = not tested)  SHOULDER SPECIAL TESTS: Impingement tests: Neer impingement test: positive , Hawkins/Kennedy impingement test: negative, and Painful arc test: positive  Rotator cuff assessment: Drop arm test: negative, Empty can test: positive , and Infraspinatus test: negative Biceps assessment: Speed's test: positive   JOINT MOBILITY TESTING:  NT  PALPATION:  Anterior portion of left shoulder TTP                                                                                                                              TREATMENT DATE:   11/05/23: THEREX: All single UE exercises performed on LUE  UBE 2.5 min forward and 2.5 min backward  Shoulder Horizontal Abduction (T's)1 x 10  -Pt reports anterior shoulder pain over left shoulder  Corner Pec Stretch at 45 degrees 2 x 60 sec  Bent Over T's #3 DB 1 x 10  -Pt reports increased stiffness Bent Over T's with #1 DB 2 x 10  Standing Posterior Capsule Stretch 3 x 30 sec   Bent Over T's with #1 DB 1 x 10  -Pt able to achieve improved range of motion  Upper Trap Stretch 3 x 30 sec   -min VC for sequence of exercise  10/31/23 (eval): Eccentric left shoulder flexion with yellow band 2 x 10     PATIENT EDUCATION: Education details: Form and technique and explanation of shoulder impingement and biceps tendinitis  Person educated: Patient Education  method: Programmer, multimedia, Demonstration, Verbal cues, and Handouts Education comprehension: verbalized understanding, returned demonstration, and verbal cues required  HOME EXERCISE PROGRAM: Access Code: [email or web address] URL: [email or web address] Date: 11/05/2023 Prepared by: Ellin Goodie  Exercises - Standing Shoulder Flexion with Resistance  - 3-4 x weekly - 3 sets - 10 reps - Doorway Pec Stretch at 60 Elevation  - 1 x daily - 7 x weekly - 3 reps - 60 sec  hold - Standing Sleeper Stretch at [employer]  - 1 x daily - 7 x weekly - 3 reps - 60 sec  hold - Seated Upper Trapezius Stretch  - 1 x daily - 7 x weekly - 3 reps - 60 sec hold - Single Arm Bent Over Shoulder Horizontal Abduction with  Dumbbell - Palm Down  - 3-4 x weekly - 3 sets - 10 reps  Patient Education - Shoulder Impingement  ASSESSMENT:  CLINICAL IMPRESSION: Pt presents for initial treatment after eval for left shoulder pain. He shows a low amount of limitations with left shoulder as evidence by QuickDash score. He does show decreased shoulder and cervical mobility as evidenced by feeling of increased stretch with exercises and improved ROM with horizontal abduction after performing sleeper stretch. He will continue benefit from skilled PT to address the aforementioned deficits to complete activities like driving and reaching for objects at work without pain and discomfort.   OBJECTIVE IMPAIRMENTS: decreased strength, impaired UE functional use, postural dysfunction, and pain.   ACTIVITY LIMITATIONS: carrying, lifting, and reach over head  PARTICIPATION LIMITATIONS: driving, occupation, and yard work  PERSONAL FACTORS: 3+ comorbidities: HTN, T2DM, and COPD are also affecting patient's functional outcome.   REHAB POTENTIAL: Good  CLINICAL DECISION MAKING: Stable/uncomplicated  EVALUATION COMPLEXITY: Low   GOALS: Goals reviewed with patient? No  SHORT TERM GOALS: Target date: 11/15/2023  Patient will demonstrate  undestanding of home exercise plan by performing exercises correctly with evidence of good carry over with min to no verbal or tactile cues .   Baseline: NT 11/05/23: Able to perform exercises without difficulty.  Goal status: ONGOING      LONG TERM GOALS: Target date: 01/10/2024  Patient will improve left shoulder function as evidenced by an improvement in QuickDash score of >=10 pts to return to using LUE more reliably for driving and work related tasks. Baseline: 18 pt or 18% Goal status: ONGOING   2. Patient will exhibit symmetrical left shoulder and periscapular strength to right shoulder strength for improved UE function to perform driving and lifting tasks without pain or discomfort  Baseline: Mid Trap R/L 4/4, Lower Trap R/L 4-/4-, Shoulder Extension R/L 4/4  Goal status: ONGOING     PLAN:  PT FREQUENCY: 1-2x/week  PT DURATION: 10 weeks  PLANNED INTERVENTIONS: 97164- PT Re-evaluation, 97110-Therapeutic exercises, 97530- Therapeutic activity, 97112- Neuromuscular re-education, 97535- Self Care, 16109- Manual therapy, [id number]- Aquatic Therapy, U0454- Electrical stimulation (unattended), [phone]- Electrical stimulation (manual), Patient/Family education, Dry Needling, Joint mobilization, Joint manipulation, Spinal manipulation, Spinal mobilization, DME instructions, Cryotherapy, Moist heat, and Biofeedback  PLAN FOR NEXT SESSION: D2 PNF flexion and extension. Revisit eccentric shoulder flexion exercise and attempt shoulder abduction exercises.    Ellin Goodie PT, DPT  [facility] [address], [place], [address] Phone: [phone]   Fax:  [phone]

## 2023-11-07 ENCOUNTER — Ambulatory Visit: Admitting: Physical Therapy

## 2023-11-07 DIAGNOSIS — M25512 Pain in left shoulder: Secondary | ICD-10-CM | POA: Diagnosis not present

## 2023-11-07 NOTE — Therapy (Signed)
 OUTPATIENT PHYSICAL THERAPY SHOULDER TREATMENT    Patient Name: James Peck MRN: [id number] DOB:November 14, 1940, 83 y.o., male Today's Date: 11/07/2023  END OF SESSION:  PT End of Session - 11/07/23 1048     Visit Number 3    Number of Visits 20    Date for PT Re-Evaluation 01/09/24    Authorization Type [id number]    Authorization - Visit Number 3    Authorization - Number of Visits 20    Progress Note Due on Visit 10    PT Start Time 1040    PT Stop Time 1120    PT Time Calculation (min) 40 min    Activity Tolerance Patient tolerated treatment well    Behavior During Therapy WFL for tasks assessed/performed             Past Medical History:  Diagnosis Date   Allergy    Arthritis    Coronary artery disease    GERD (gastroesophageal reflux disease)    History of hiatal hernia    Hypercholesteremia    Hypertension    Past Surgical History:  Procedure Laterality Date   CARDIAC CATHETERIZATION     cardiac stents     CATARACT EXTRACTION W/PHACO Right 09/10/2017   Procedure: CATARACT EXTRACTION PHACO AND INTRAOCULAR LENS PLACEMENT (IOC);  Surgeon: Galen Manila, MD;  Location: [facility] ORS;  Service: Ophthalmology;  Laterality: Right;  [place] 01:01.7 AP% 15.3 CDE 9.38 Fluid pack lot # [id number]   CATARACT EXTRACTION W/PHACO Left 10/02/2017   Procedure: CATARACT EXTRACTION PHACO AND INTRAOCULAR LENS PLACEMENT (IOC);  Surgeon: Galen Manila, MD;  Location: [facility];  Service: Ophthalmology;  Laterality: Left;  [place] 00:42.3 AP% 16.2 CDE 6.85 Fluid Pack Lot # [id number]   COLONOSCOPY WITH PROPOFOL N/A 06/28/2017   Procedure: COLONOSCOPY WITH PROPOFOL;  Surgeon: Scot Jun, MD;  Location: [facility];  Service: Endoscopy;  Laterality: N/A;   COLONOSCOPY WITH PROPOFOL N/A 12/12/2021   Procedure: COLONOSCOPY WITH PROPOFOL;  Surgeon: Regis Bill, MD;  Location: [facility] ENDOSCOPY;  Service: Endoscopy;  Laterality: N/A;   CORONARY ANGIOPLASTY     STENTS X 2   EYE  SURGERY     KNEE ARTHROSCOPY     LEFT HEART CATH AND CORONARY ANGIOGRAPHY N/A 09/12/2020   Procedure: LEFT HEART CATH AND CORONARY ANGIOGRAPHY;  Surgeon: Marcina Millard, MD;  Location: [facility];  Service: Cardiovascular;  Laterality: N/A;   Patient Active Problem List   Diagnosis Date Noted   Shoulder pain, left 10/23/2023   Memory change 06/21/2023   Diabetes mellitus type 2 with complications (HCC) 01/07/2023   BPH (benign prostatic hyperplasia) 11/15/2022   Asthma-COPD overlap syndrome (HCC) 01/31/2022   Bronchiectasis without complication (HCC) 01/31/2022   Hematuria 10/01/2021   History of colon polyps 08/06/2021   Hyponatremia 04/09/2021   Subclavian artery stenosis, left (HCC) 02/04/2021   Carotid artery disease (HCC) 02/04/2021   Coronary artery disease    Chest pain    NSTEMI (non-ST elevated myocardial infarction) (HCC)    COPD exacerbation (HCC)    Allergic rhinitis 04/12/2020   Emphysema, unspecified (HCC) 04/12/2020   Former smoker 04/12/2020   Lung nodules 03/27/2020   Renal lesion 03/27/2020   Abnormal abdominal CT scan 02/16/2020   Right rotator cuff tear 08/19/2019   Cough 06/30/2019   Healthcare maintenance 08/28/2018   Hyperglycemia 12/22/2017   Degenerative arthritis of left knee 06/04/2017   Degenerative tear of meniscus of left knee 02/14/2015   Acid reflux 01/27/2015  Adenomatous colon polyp 01/27/2015   Allergy 01/27/2015   Arteriosclerosis of coronary artery 01/27/2015   Lumbar radiculopathy 01/27/2015   CAD in native artery 01/07/2014   Benign essential HTN 10/21/2013   HLD (hyperlipidemia) 10/21/2013    PCP: Dr. Dale Gilman    REFERRING PROVIDER: Dr. Dale Handley    REFERRING DIAG: [phone] (ICD-10-CM) - Left shoulder pain, unspecified chronicity  THERAPY DIAG:  Acute pain of left shoulder  Rationale for Evaluation and Treatment: Rehabilitation  ONSET DATE: 09/30/23  SUBJECTIVE:                                                                                                                                                                                       SUBJECTIVE STATEMENT:  Pt reports that he has not felt as much pain in his left shoulder as he normally does.   Hand dominance: Right  PERTINENT HISTORY: Pt reports that he started experiencing left shoulder pain last month that developed insidiously. He is not sure what caused it and he experiences most of his pain when driving and resting his left arm. 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   PAIN:  Are you having pain? Yes: NPRS scale: 1-2/10 Pain location: Anterior surface of left glenohumeral joint  Pain description: Achy  Aggravating factors: Putting weight on it like resting on car door  Relieving factors: Blue rub  PRECAUTIONS: None  RED FLAGS: None   WEIGHT BEARING RESTRICTIONS: No  FALLS:  Has patient fallen in last 6 months? No  LIVING ENVIRONMENT: Lives with: lives with their spouse Lives in: House/apartment Stairs: Yes: Internal: 13 steps; on right going up, on left going up, and can reach both Has following equipment at home: None  OCCUPATION: Educational psychologist    PLOF: Independent  PATIENT GOALS: To feel less left shoulder pain  NEXT MD VISIT: Not sure   OBJECTIVE:  Note: Objective measures were completed at Evaluation unless otherwise noted.  VITALS BP 162/77  HR 60 SpO2 96%  DIAGNOSTIC FINDINGS:  None listed    PATIENT SURVEYS:  Quick Dash 18.2%  COGNITION: Overall cognitive status: Within functional limits for tasks assessed     SENSATION: WFL  POSTURE: Rounded shoulders    UPPER EXTREMITY ROM:   Active ROM Right eval Left eval  Shoulder flexion 180 180  Shoulder extension    Shoulder abduction 180 180*  Shoulder adduction    Shoulder internal rotation    Shoulder external rotation    Elbow flexion    Elbow extension    Wrist flexion    Wrist  extension  Wrist ulnar deviation    Wrist radial deviation    Wrist pronation    Wrist supination    (Blank rows = not tested)   UPPER EXTREMITY MMT:  MMT Right eval Left eval  Shoulder flexion 4+ 4+  Shoulder extension 4 4  Shoulder abduction 4+ 4+  Shoulder adduction    Shoulder internal rotation 4+ 4+  Shoulder external rotation 4+ 4+*  Middle trapezius 4 4  Lower trapezius 4- 4-  Elbow flexion    Elbow extension    Wrist flexion    Wrist extension    Wrist ulnar deviation    Wrist radial deviation    Wrist pronation    Wrist supination    Grip strength (lbs)    (Blank rows = not tested)  SHOULDER SPECIAL TESTS: Impingement tests: Neer impingement test: positive , Hawkins/Kennedy impingement test: negative, and Painful arc test: positive  Rotator cuff assessment: Drop arm test: negative, Empty can test: positive , and Infraspinatus test: negative Biceps assessment: Speed's test: positive   JOINT MOBILITY TESTING:  NT  PALPATION:  Anterior portion of left shoulder TTP                                                                                                                              TREATMENT DATE:    11/07/23: All single UE exercses performed on LUE  THEREX:  Eccentric Shoulder Flexion on LUE with yellow band 1 x 10 -Pt reports increased shoulder pain at 120 degrees when extending shoulder Eccentric Shoulder Abduction AROM to 160 deg 3 x 10  -mod VC to decreased shoulder extension and to get close to wall when sliding up   NMR  Mirror provided visual input for improved neuromuscular control   Eccentric Shoulder Flexion on LUE AROM 1 x 10  -min VC to improve scapular retraction   Eccentric Shoulder Flexion on LUE #1 DB 1 x 10   Eccentric Shoulder Flexion on LUE #2 DB 1 x 10     11/05/23: THEREX: All single UE exercises performed on LUE  UBE 2.5 min forward and 2.5 min backward  Shoulder Horizontal Abduction (T's)1 x 10  -Pt reports anterior  shoulder pain over left shoulder  Corner Pec Stretch at 45 degrees 2 x 60 sec  Bent Over T's #3 DB 1 x 10  -Pt reports increased stiffness Bent Over T's with #1 DB 2 x 10  Standing Posterior Capsule Stretch 3 x 30 sec   Bent Over T's with #1 DB 1 x 10  -Pt able to achieve improved range of motion  Upper Trap Stretch 3 x 30 sec   -min VC for sequence of exercise  10/31/23 (eval): Eccentric left shoulder flexion with yellow band 2 x 10     PATIENT EDUCATION: Education details: Form and technique and explanation of shoulder impingement and biceps tendinitis  Person educated: Patient Education method: Explanation, Demonstration, Verbal cues, and Handouts Education comprehension: verbalized  understanding, returned demonstration, and verbal cues required  HOME EXERCISE PROGRAM: Access Code: [email or web address] URL: [email or web address] Date: 11/07/2023 Prepared by: Ellin Goodie  Exercises - Standing Shoulder Flexion with Resistance with Dumbbell instead of Band   - 3-4 x weekly - 3 sets - 10 reps - Single Arm Bent Over Shoulder Horizontal Abduction with Dumbbell - Palm Down  - 3-4 x weekly - 3 sets - 10 reps - Shoulder Abduction- Slide Up Wall, Step Away and Bring Weight Down Slow   - 3-4 x weekly - 3 sets - 10 reps - Doorway Pec Stretch at 60 Elevation  - 1 x daily - 7 x weekly - 3 reps - 60 sec  hold - Standing Sleeper Stretch at [employer]  - 1 x daily - 7 x weekly - 3 reps - 60 sec  hold - Seated Upper Trapezius Stretch  - 1 x daily - 7 x weekly - 3 reps - 60 sec hold  Patient Education - Shoulder Impingement  ASSESSMENT:  CLINICAL IMPRESSION: Pt shows improvement in pain response in left shoulder with improved activation of periscapular retractors for improved shoulder clearance. After massed practice and minimal cueing, pt was able to perform shoulder raises without increased pain. He will continue benefit from skilled PT to address the aforementioned deficits to complete  activities like driving and reaching for objects at work without pain and discomfort.   OBJECTIVE IMPAIRMENTS: decreased strength, impaired UE functional use, postural dysfunction, and pain.   ACTIVITY LIMITATIONS: carrying, lifting, and reach over head  PARTICIPATION LIMITATIONS: driving, occupation, and yard work  PERSONAL FACTORS: 3+ comorbidities: HTN, T2DM, and COPD are also affecting patient's functional outcome.   REHAB POTENTIAL: Good  CLINICAL DECISION MAKING: Stable/uncomplicated  EVALUATION COMPLEXITY: Low   GOALS: Goals reviewed with patient? No  SHORT TERM GOALS: Target date: 11/15/2023  Patient will demonstrate undestanding of home exercise plan by performing exercises correctly with evidence of good carry over with min to no verbal or tactile cues .   Baseline: NT 11/05/23: Able to perform exercises without difficulty.  Goal status: ONGOING      LONG TERM GOALS: Target date: 01/10/2024  Patient will improve left shoulder function as evidenced by an improvement in QuickDash score of >=10 pts to return to using LUE more reliably for driving and work related tasks. Baseline: 18 pt or 18% Goal status: ONGOING   2. Patient will exhibit symmetrical left shoulder and periscapular strength to right shoulder strength for improved UE function to perform driving and lifting tasks without pain or discomfort  Baseline: Mid Trap R/L 4/4, Lower Trap R/L 4-/4-, Shoulder Extension R/L 4/4  Goal status: ONGOING     PLAN:  PT FREQUENCY: 1-2x/week  PT DURATION: 10 weeks  PLANNED INTERVENTIONS: 97164- PT Re-evaluation, 97110-Therapeutic exercises, 97530- Therapeutic activity, 97112- Neuromuscular re-education, 97535- Self Care, 54098- Manual therapy, [id number]- Aquatic Therapy, J1914- Electrical stimulation (unattended), [phone]- Electrical stimulation (manual), Patient/Family education, Dry Needling, Joint mobilization, Joint manipulation, Spinal manipulation, Spinal mobilization, DME  instructions, Cryotherapy, Moist heat, and Biofeedback  PLAN FOR NEXT SESSION: D2 PNF flexion and extension. Revisit eccentric shoulder flexion exercise and attempt shoulder abduction exercises.    Ellin Goodie PT, DPT  [facility] [address] [place], [address] Phone: [phone]   Fax:  [phone]

## 2023-11-11 ENCOUNTER — Telehealth: Payer: Self-pay | Admitting: Physical Therapy

## 2023-11-11 NOTE — Telephone Encounter (Signed)
Note was entered in error

## 2023-11-12 ENCOUNTER — Ambulatory Visit: Admitting: Physical Therapy

## 2023-11-12 DIAGNOSIS — M25512 Pain in left shoulder: Secondary | ICD-10-CM

## 2023-11-12 NOTE — Therapy (Signed)
 OUTPATIENT PHYSICAL THERAPY SHOULDER TREATMENT    Patient Name: James Peck MRN: [id number] DOB:August 06, 1940, 83 y.o., male Today's Date: 11/12/2023  END OF SESSION:  PT End of Session - 11/12/23 1216     Visit Number 4    Number of Visits 20    Date for PT Re-Evaluation 01/09/24    Authorization Type HTA [id number]    Authorization - Visit Number 4    Authorization - Number of Visits 20    Progress Note Due on Visit 10    PT Start Time 1030    PT Stop Time 1115    PT Time Calculation (min) 45 min    Activity Tolerance Patient tolerated treatment well    Behavior During Therapy WFL for tasks assessed/performed              Past Medical History:  Diagnosis Date   Allergy    Arthritis    Coronary artery disease    GERD (gastroesophageal reflux disease)    History of hiatal hernia    Hypercholesteremia    Hypertension    Past Surgical History:  Procedure Laterality Date   CARDIAC CATHETERIZATION     cardiac stents     CATARACT EXTRACTION W/PHACO Right 09/10/2017   Procedure: CATARACT EXTRACTION PHACO AND INTRAOCULAR LENS PLACEMENT (IOC);  Surgeon: Galen Manila, MD;  Location: [facility] ORS;  Service: Ophthalmology;  Laterality: Right;  [place] 01:01.7 AP% 15.3 CDE 9.38 Fluid pack lot # [id number]   CATARACT EXTRACTION W/PHACO Left 10/02/2017   Procedure: CATARACT EXTRACTION PHACO AND INTRAOCULAR LENS PLACEMENT (IOC);  Surgeon: Galen Manila, MD;  Location: [facility] ORS;  Service: Ophthalmology;  Laterality: Left;  [place] 00:42.3 AP% 16.2 CDE 6.85 Fluid Pack Lot # [id number]   COLONOSCOPY WITH PROPOFOL N/A 06/28/2017   Procedure: COLONOSCOPY WITH PROPOFOL;  Surgeon: Scot Jun, MD;  Location: [facility] ENDOSCOPY;  Service: Endoscopy;  Laterality: N/A;   COLONOSCOPY WITH PROPOFOL N/A 12/12/2021   Procedure: COLONOSCOPY WITH PROPOFOL;  Surgeon: Regis Bill, MD;  Location: [facility] ENDOSCOPY;  Service: Endoscopy;  Laterality: N/A;   CORONARY ANGIOPLASTY     STENTS X 2   EYE  SURGERY     KNEE ARTHROSCOPY     LEFT HEART CATH AND CORONARY ANGIOGRAPHY N/A 09/12/2020   Procedure: LEFT HEART CATH AND CORONARY ANGIOGRAPHY;  Surgeon: Marcina Millard, MD;  Location: [facility] INVASIVE CV LAB;  Service: Cardiovascular;  Laterality: N/A;   Patient Active Problem List   Diagnosis Date Noted   Shoulder pain, left 10/23/2023   Memory change 06/21/2023   Diabetes mellitus type 2 with complications (HCC) 01/07/2023   BPH (benign prostatic hyperplasia) 11/15/2022   Asthma-COPD overlap syndrome (HCC) 01/31/2022   Bronchiectasis without complication (HCC) 01/31/2022   Hematuria 10/01/2021   History of colon polyps 08/06/2021   Hyponatremia 04/09/2021   Subclavian artery stenosis, left (HCC) 02/04/2021   Carotid artery disease (HCC) 02/04/2021   Coronary artery disease    Chest pain    NSTEMI (non-ST elevated myocardial infarction) (HCC)    COPD exacerbation (HCC)    Allergic rhinitis 04/12/2020   Emphysema, unspecified (HCC) 04/12/2020   Former smoker 04/12/2020   Lung nodules 03/27/2020   Renal lesion 03/27/2020   Abnormal abdominal CT scan 02/16/2020   Right rotator cuff tear 08/19/2019   Cough 06/30/2019   Healthcare maintenance 08/28/2018   Hyperglycemia 12/22/2017   Degenerative arthritis of left knee 06/04/2017   Degenerative tear of meniscus of left knee 02/14/2015   Acid reflux 01/27/2015  Adenomatous colon polyp 01/27/2015   Allergy 01/27/2015   Arteriosclerosis of coronary artery 01/27/2015   Lumbar radiculopathy 01/27/2015   CAD in native artery 01/07/2014   Benign essential HTN 10/21/2013   HLD (hyperlipidemia) 10/21/2013    PCP: Dr. Dale [place]    REFERRING PROVIDER: Dr. Dale     REFERRING DIAG: [phone] (ICD-10-CM) - Left shoulder pain, unspecified chronicity  THERAPY DIAG:  Acute pain of left shoulder  Rationale for Evaluation and Treatment: Rehabilitation  ONSET DATE: 09/30/23  SUBJECTIVE:                                                                                                                                                                                       SUBJECTIVE STATEMENT: Pt reports that his shoulder feels some pain but that his shoulder feels better overall.   Hand dominance: Right  PERTINENT HISTORY: Pt reports that he started experiencing left shoulder pain last month that developed insidiously. He is not sure what caused it and he experiences most of his pain when driving and resting his left arm. 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   PAIN:  Are you having pain? Yes: NPRS scale: 1-2/10 Pain location: Anterior surface of left glenohumeral joint  Pain description: Achy  Aggravating factors: Putting weight on it like resting on car door  Relieving factors: Blue rub  PRECAUTIONS: None  RED FLAGS: None   WEIGHT BEARING RESTRICTIONS: No  FALLS:  Has patient fallen in last 6 months? No  LIVING ENVIRONMENT: Lives with: lives with their spouse Lives in: House/apartment Stairs: Yes: Internal: 13 steps; on right going up, on left going up, and can reach both Has following equipment at home: None  OCCUPATION: Educational psychologist    PLOF: Independent  PATIENT GOALS: To feel less left shoulder pain  NEXT MD VISIT: Not sure   OBJECTIVE:  Note: Objective measures were completed at Evaluation unless otherwise noted.  VITALS BP 162/77  HR 60 SpO2 96%  DIAGNOSTIC FINDINGS:  None listed    PATIENT SURVEYS:  Quick Dash 18.2%  COGNITION: Overall cognitive status: Within functional limits for tasks assessed     SENSATION: WFL  POSTURE: Rounded shoulders    UPPER EXTREMITY ROM:   Active ROM Right eval Left eval  Shoulder flexion 180 180  Shoulder extension    Shoulder abduction 180 180*  Shoulder adduction    Shoulder internal rotation    Shoulder external rotation    Elbow flexion    Elbow extension    Wrist flexion    Wrist  extension    Wrist ulnar deviation  Wrist radial deviation    Wrist pronation    Wrist supination    (Blank rows = not tested)   UPPER EXTREMITY MMT:  MMT Right eval Left eval  Shoulder flexion 4+ 4+  Shoulder extension 4 4  Shoulder abduction 4+ 4+  Shoulder adduction    Shoulder internal rotation 4+ 4+  Shoulder external rotation 4+ 4+*  Middle trapezius 4 4  Lower trapezius 4- 4-  Elbow flexion    Elbow extension    Wrist flexion    Wrist extension    Wrist ulnar deviation    Wrist radial deviation    Wrist pronation    Wrist supination    Grip strength (lbs)    (Blank rows = not tested)  SHOULDER SPECIAL TESTS: Impingement tests: Neer impingement test: positive , Hawkins/Kennedy impingement test: negative, and Painful arc test: positive  Rotator cuff assessment: Drop arm test: negative, Empty can test: positive , and Infraspinatus test: negative Biceps assessment: Speed's test: positive   JOINT MOBILITY TESTING:  NT  PALPATION:  Anterior portion of left shoulder TTP                                                                                                                              TREATMENT DATE:   11/12/23: All single UE exercses performed on LUE  THEREX:  UBE seat at 10 with resistance for 4 - 2.5 min forward and 2.5 backward   Wall Slides into low trap setting 3 x 10  -mod VC to avoid lumbar and thoracic extension.  Seated Thoracic Extension with 3 sec hold 2 x 10  OMEGA Seated Rows #25 lbs 1 x 10  OMEGA Seated Rows #35 lbs 2 x 10  OMEGA Lat Pull Down #25 3 x 10  -min VC for sequence of exercise     11/07/23: All single UE exercses performed on LUE  THEREX:  Eccentric Shoulder Flexion on LUE with yellow band 1 x 10 -Pt reports increased shoulder pain at 120 degrees when extending shoulder Eccentric Shoulder Abduction AROM to 160 deg 3 x 10  -mod VC to decreased shoulder extension and to get close to wall when sliding up   NMR  Mirror  provided visual input for improved neuromuscular control   Eccentric Shoulder Flexion on LUE AROM 1 x 10  -min VC to improve scapular retraction   Eccentric Shoulder Flexion on LUE #1 DB 1 x 10   Eccentric Shoulder Flexion on LUE #2 DB 1 x 10     11/05/23: THEREX: All single UE exercises performed on LUE  UBE 2.5 min forward and 2.5 min backward  Shoulder Horizontal Abduction (T's)1 x 10  -Pt reports anterior shoulder pain over left shoulder  Corner Pec Stretch at 45 degrees 2 x 60 sec  Bent Over T's #3 DB 1 x 10  -Pt reports increased stiffness Bent Over T's with #1 DB 2 x 10  Standing Posterior Capsule  Stretch 3 x 30 sec   Bent Over T's with #1 DB 1 x 10  -Pt able to achieve improved range of motion  Upper Trap Stretch 3 x 30 sec   -min VC for sequence of exercise  10/31/23 (eval): Eccentric left shoulder flexion with yellow band 2 x 10     PATIENT EDUCATION: Education details: Form and technique and explanation of shoulder impingement and biceps tendinitis  Person educated: Patient Education method: Programmer, multimedia, Demonstration, Verbal cues, and Handouts Education comprehension: verbalized understanding, returned demonstration, and verbal cues required  HOME EXERCISE PROGRAM: Access Code: [email or web address] URL: [email or web address] Date: 11/12/2023 Prepared by: Ellin Goodie  Exercises - Seated Upper Trapezius Stretch  - 1 x daily - 7 x weekly - 3 reps - 60 sec hold - Standing Sleeper Stretch at [employer]  - 1 x daily - 7 x weekly - 3 reps - 60 sec  hold - Doorway Pec Stretch at 60 Elevation  - 1 x daily - 7 x weekly - 3 reps - 60 sec  hold - Seated Thoracic Lumbar Extension  - 1 x daily - 7 x weekly - 2 sets - 10 reps - 3 sec  hold - Low Trap Setting at Wall  - 3-4 x weekly - 3 sets - 10 reps - Standing Shoulder Flexion with Resistance with Dumbbell instead of Band   - 3-4 x weekly - 3 sets - 10 reps - Single Arm Bent Over Shoulder Horizontal Abduction with Dumbbell -  Palm Down  - 3-4 x weekly - 3 sets - 10 reps - Shoulder Abduction- Slide Up Wall, Step Away and Bring Weight Down Slow   - 3-4 x weekly - 3 sets - 10 reps  Patient Education - Shoulder Impingement  ASSESSMENT:  CLINICAL IMPRESSION: Pt continues to progress towards goals with ability to perform overhead exercises for periscapular and shoulder strengthening without excessive pain and discomfort. He demonstrates compensation for decreased scapular retraction with increased thoracic and lumbar extension, but he is able to self correct with minimal verbal cueing. He will continue benefit from skilled PT to address the aforementioned deficits to complete activities like driving and reaching for objects at work without pain and discomfort.    OBJECTIVE IMPAIRMENTS: decreased strength, impaired UE functional use, postural dysfunction, and pain.   ACTIVITY LIMITATIONS: carrying, lifting, and reach over head  PARTICIPATION LIMITATIONS: driving, occupation, and yard work  PERSONAL FACTORS: 3+ comorbidities: HTN, T2DM, and COPD are also affecting patient's functional outcome.   REHAB POTENTIAL: Good  CLINICAL DECISION MAKING: Stable/uncomplicated  EVALUATION COMPLEXITY: Low   GOALS: Goals reviewed with patient? No  SHORT TERM GOALS: Target date: 11/15/2023  Patient will demonstrate undestanding of home exercise plan by performing exercises correctly with evidence of good carry over with min to no verbal or tactile cues .   Baseline: NT 11/05/23: Able to perform exercises without difficulty.  Goal status: ONGOING      LONG TERM GOALS: Target date: 01/10/2024  Patient will improve left shoulder function as evidenced by an improvement in QuickDash score of >=10 pts to return to using LUE more reliably for driving and work related tasks. Baseline: 18 pt or 18% Goal status: ONGOING   2. Patient will exhibit symmetrical left shoulder and periscapular strength to right shoulder strength for  improved UE function to perform driving and lifting tasks without pain or discomfort  Baseline: Mid Trap R/L 4/4, Lower Trap R/L 4-/4-, Shoulder Extension R/L 4/4  Goal status: ONGOING  PLAN:  PT FREQUENCY: 1-2x/week  PT DURATION: 10 weeks  PLANNED INTERVENTIONS: 97164- PT Re-evaluation, 97110-Therapeutic exercises, 97530- Therapeutic activity, 97112- Neuromuscular re-education, 97535- Self Care, 56213- Manual therapy, [phone]- Aquatic Therapy, G0283- Electrical stimulation (unattended), [phone]- Electrical stimulation (manual), Patient/Family education, Dry Needling, Joint mobilization, Joint manipulation, Spinal manipulation, Spinal mobilization, DME instructions, Cryotherapy, Moist heat, and Biofeedback  PLAN FOR NEXT SESSION: Revisit eccentric shoulder flexion exercise and attempt shoulder abduction exercises. Continue to progress periscapular exercises: standing T's/ bent over T's and serratus exercise.    Marge Shed PT, DPT  [facility] [address] [place], [address] Phone: [phone]   Fax:  [phone]

## 2023-11-14 ENCOUNTER — Encounter: Payer: Self-pay | Admitting: Urology

## 2023-11-14 ENCOUNTER — Ambulatory Visit (INDEPENDENT_AMBULATORY_CARE_PROVIDER_SITE_OTHER): Payer: Self-pay | Admitting: Urology

## 2023-11-14 VITALS — BP 121/67 | Ht 67.0 in | Wt 168.0 lb

## 2023-11-14 DIAGNOSIS — N400 Enlarged prostate without lower urinary tract symptoms: Secondary | ICD-10-CM | POA: Diagnosis not present

## 2023-11-14 DIAGNOSIS — Z87898 Personal history of other specified conditions: Secondary | ICD-10-CM | POA: Diagnosis not present

## 2023-11-14 DIAGNOSIS — Z125 Encounter for screening for malignant neoplasm of prostate: Secondary | ICD-10-CM

## 2023-11-14 LAB — URINALYSIS, COMPLETE
Bilirubin, UA: NEGATIVE
Glucose, UA: NEGATIVE
Ketones, UA: NEGATIVE
Leukocytes,UA: NEGATIVE
Nitrite, UA: NEGATIVE
Protein,UA: NEGATIVE
RBC, UA: NEGATIVE
Specific Gravity, UA: 1.01 (ref 1.005–1.030)
Urobilinogen, Ur: 0.2 mg/dL (ref 0.2–1.0)
pH, UA: 5.5 (ref 5.0–7.5)

## 2023-11-14 LAB — MICROSCOPIC EXAMINATION: Bacteria, UA: NONE SEEN

## 2023-11-14 NOTE — Progress Notes (Signed)
 I, Maysun Jamey Mccallum, acting as a scribe for Geraline Knapp, MD., have documented all relevant documentation on the behalf of Geraline Knapp, MD, as directed by Geraline Knapp, MD while in the presence of Geraline Knapp, MD.  11/14/2023 11:24 PM   James Peck 01-22-1941 [id number]  Referring provider: Dellar Fenton, MD [address] [place],  [place] [address]  Chief Complaint  Patient presents with   Hematuria   Urologic history: 1.  Total gross painless hematuria July 2021 No upper tract abnormalities CTU Cystoscopy with prominent lateral lobe enlargement/hypervascularity Cystoscopy 10/30/21 showed BPH with hypervascularity and he was started on Finasteride   HPI: James Peck is a 83 y.o. male presents for annual follow-up.  Doing well since last visit No bothersome LUTS Denies dysuria, gross hematuria Denies flank, abdominal or pelvic pain Remains on the finasteride .    PMH: Past Medical History:  Diagnosis Date   Allergy    Arthritis    Coronary artery disease    GERD (gastroesophageal reflux disease)    History of hiatal hernia    Hypercholesteremia    Hypertension     Surgical History: Past Surgical History:  Procedure Laterality Date   CARDIAC CATHETERIZATION     cardiac stents     CATARACT EXTRACTION W/PHACO Right 09/10/2017   Procedure: CATARACT EXTRACTION PHACO AND INTRAOCULAR LENS PLACEMENT (IOC);  Surgeon: Clair Crews, MD;  Location: [facility] ORS;  Service: Ophthalmology;  Laterality: Right;  US  01:01.7 AP% 15.3 CDE 9.38 Fluid pack lot # [id number]   CATARACT EXTRACTION W/PHACO Left 10/02/2017   Procedure: CATARACT EXTRACTION PHACO AND INTRAOCULAR LENS PLACEMENT (IOC);  Surgeon: Clair Crews, MD;  Location: [facility] ORS;  Service: Ophthalmology;  Laterality: Left;  US  00:42.3 AP% 16.2 CDE 6.85 Fluid Pack Lot # [id number]   COLONOSCOPY WITH PROPOFOL  N/A 06/28/2017   Procedure: COLONOSCOPY WITH PROPOFOL ;  Surgeon: Cassie Click, MD;  Location: [facility] ENDOSCOPY;  Service: Endoscopy;  Laterality: N/A;   COLONOSCOPY WITH PROPOFOL  N/A 12/12/2021   Procedure: COLONOSCOPY WITH PROPOFOL ;  Surgeon: Shane Darling, MD;  Location: [facility] ENDOSCOPY;  Service: Endoscopy;  Laterality: N/A;   CORONARY ANGIOPLASTY     STENTS X 2   EYE SURGERY     KNEE ARTHROSCOPY     LEFT HEART CATH AND CORONARY ANGIOGRAPHY N/A 09/12/2020   Procedure: LEFT HEART CATH AND CORONARY ANGIOGRAPHY;  Surgeon: Percival Brace, MD;  Location: [facility] INVASIVE CV LAB;  Service: Cardiovascular;  Laterality: N/A;    Home Medications:  Allergies as of 11/14/2023       Reactions   Ace Inhibitors Cough   Enalapril Cough        Medication List        Accurate as of November 14, 2023 11:24 PM. If you have any questions, ask your nurse or doctor.          albuterol  108 (90 Base) MCG/ACT inhaler Commonly known as: VENTOLIN  HFA Inhale 1 puff into the lungs every 6 (six) hours as needed for wheezing or shortness of breath.   aspirin  EC 81 MG tablet Take 81 mg by mouth daily.   BLACK CHERRY CONCENTRATE PO Take 1,000 mg daily by mouth.   calcium  carbonate 1250 (500 Ca) MG tablet Commonly known as: OS-CAL - dosed in mg of elemental calcium  Take 1 tablet by mouth daily.   cetirizine  10 MG tablet Commonly known as: ZYRTEC  TAKE 1 TABLET BY MOUTH DAILY   CINNAMON  PO Take 1,200  mg by mouth 2 times daily at 12 noon and 4 pm.   ezetimibe  10 MG tablet Commonly known as: ZETIA  TAKE 1 TABLET BY MOUTH DAILY.   famotidine  20 MG tablet Commonly known as: PEPCID  Take 1 tablet (20 mg total) by mouth daily.   finasteride  5 MG tablet Commonly known as: PROSCAR  TAKE ONE TABLET EVERY DAY   Fish Oil 1000 MG Caps Take 1,000 mg by mouth daily.   fluticasone  50 MCG/ACT nasal spray Commonly known as: FLONASE  TAKE 2 SPRAYS INTO BOTH NOSTRILS DAILY   GLUCOSAMINE-CHONDROITIN PO Take 1 capsule by mouth daily.   nitroGLYCERIN  0.4 MG SL  tablet Commonly known as: NITROSTAT  Place 1 tablet (0.4 mg total) under the tongue every 5 (five) minutes as needed for chest pain.   rosuvastatin  20 MG tablet Commonly known as: CRESTOR  TAKE 1 TABLET BY MOUTH DAILY.   sildenafil  20 MG tablet Commonly known as: REVATIO  TAKE 1 TO 2 TABLETS BY MOUTH EVERY DAY AS NEEDED   telmisartan  40 MG tablet Commonly known as: MICARDIS  TAKE 1 TABLET BY MOUTH ONCE DAILY   Trelegy Ellipta  100-62.5-25 MCG/ACT Aepb Generic drug: Fluticasone -Umeclidin-Vilant INHALE ONE PUFF INTO THE LUNGS EVERY DAY        Allergies:  Allergies  Allergen Reactions   Ace Inhibitors Cough   Enalapril Cough    Family History: Family History  Problem Relation Age of Onset   Heart disease Mother    Diabetes Mother    Heart disease Father    Diabetes Father    Alzheimer's disease Brother    Stroke Brother     Social History:  reports that he quit smoking about 25 years ago. His smoking use included cigarettes. He started smoking about 65 years ago. He has a 40 pack-year smoking history. He has never used smokeless tobacco. He reports that he does not drink alcohol and does not use drugs.   Physical Exam: BP 121/67   Ht 5\' 7"  (1.702 m)   Wt 168 lb (76.2 kg)   BMI 26.31 kg/m   Constitutional:  Alert and oriented, No acute distress. HEENT: [place] AT Respiratory: Normal respiratory effort, no increased work of breathing. Psychiatric: Normal mood and affect.   Assessment & Plan:    1. History of gross hematuria No recurrent episodes He is no longer taking Plavix    2. BPH without LUTS Stable  3. Prostate cancer screening He inquired if he needed to have a prostate check or PSA. We discussed that current prostate cancer screening guidelines do not recommend PSA or DRE in patients in their 29s.  I have reviewed the above documentation for accuracy and completeness, and I agree with the above.   Geraline Knapp, MD  [facility] [address], [address] [place], [place] [address] [phone]

## 2023-11-19 ENCOUNTER — Ambulatory Visit: Admitting: Physical Therapy

## 2023-11-19 DIAGNOSIS — M25512 Pain in left shoulder: Secondary | ICD-10-CM | POA: Diagnosis not present

## 2023-11-19 NOTE — Therapy (Signed)
 OUTPATIENT PHYSICAL THERAPY SHOULDER TREATMENT    Patient Name: James Peck MRN: [id number] DOB:29-Nov-1940, 83 y.o., male Today's Date: 11/19/2023  END OF SESSION:  PT End of Session - 11/19/23 0954     Visit Number 5    Number of Visits 20    Date for PT Re-Evaluation 01/09/24    Authorization Type [id number]    Authorization - Visit Number 5    Authorization - Number of Visits 20    Progress Note Due on Visit 10    PT Start Time 0950    PT Stop Time 1030    PT Time Calculation (min) 40 min    Activity Tolerance Patient tolerated treatment well    Behavior During Therapy WFL for tasks assessed/performed              Past Medical History:  Diagnosis Date   Allergy    Arthritis    Coronary artery disease    GERD (gastroesophageal reflux disease)    History of hiatal hernia    Hypercholesteremia    Hypertension    Past Surgical History:  Procedure Laterality Date   CARDIAC CATHETERIZATION     cardiac stents     CATARACT EXTRACTION W/PHACO Right 09/10/2017   Procedure: CATARACT EXTRACTION PHACO AND INTRAOCULAR LENS PLACEMENT (IOC);  Surgeon: Clair Crews, MD;  Location: [facility] ORS;  Service: Ophthalmology;  Laterality: Right;  US  01:01.7 AP% 15.3 CDE 9.38 Fluid pack lot # [id number]   CATARACT EXTRACTION W/PHACO Left 10/02/2017   Procedure: CATARACT EXTRACTION PHACO AND INTRAOCULAR LENS PLACEMENT (IOC);  Surgeon: Clair Crews, MD;  Location: [facility];  Service: Ophthalmology;  Laterality: Left;  US  00:42.3 AP% 16.2 CDE 6.85 Fluid Pack Lot # [id number]   COLONOSCOPY WITH PROPOFOL  N/A 06/28/2017   Procedure: COLONOSCOPY WITH PROPOFOL ;  Surgeon: Cassie Click, MD;  Location: [facility] ENDOSCOPY;  Service: Endoscopy;  Laterality: N/A;   COLONOSCOPY WITH PROPOFOL  N/A 12/12/2021   Procedure: COLONOSCOPY WITH PROPOFOL ;  Surgeon: Shane Darling, MD;  Location: [facility] ENDOSCOPY;  Service: Endoscopy;  Laterality: N/A;   CORONARY ANGIOPLASTY     STENTS X 2   EYE  SURGERY     KNEE ARTHROSCOPY     LEFT HEART CATH AND CORONARY ANGIOGRAPHY N/A 09/12/2020   Procedure: LEFT HEART CATH AND CORONARY ANGIOGRAPHY;  Surgeon: Percival Brace, MD;  Location: [facility] INVASIVE CV [facility];  Service: Cardiovascular;  Laterality: N/A;   Patient Active Problem List   Diagnosis Date Noted   Shoulder pain, left 10/23/2023   Memory change 06/21/2023   Diabetes mellitus type 2 with complications (HCC) 01/07/2023   BPH (benign prostatic hyperplasia) 11/15/2022   Asthma-COPD overlap syndrome (HCC) 01/31/2022   Bronchiectasis without complication (HCC) 01/31/2022   Hematuria 10/01/2021   History of colon polyps 08/06/2021   Hyponatremia 04/09/2021   Subclavian artery stenosis, left (HCC) 02/04/2021   Carotid artery disease (HCC) 02/04/2021   Coronary artery disease    Chest pain    NSTEMI (non-ST elevated myocardial infarction) (HCC)    COPD exacerbation (HCC)    Allergic rhinitis 04/12/2020   Emphysema, unspecified (HCC) 04/12/2020   Former smoker 04/12/2020   Lung nodules 03/27/2020   Renal lesion 03/27/2020   Abnormal abdominal CT scan 02/16/2020   Right rotator cuff tear 08/19/2019   Cough 06/30/2019   Healthcare maintenance 08/28/2018   Hyperglycemia 12/22/2017   Degenerative arthritis of left knee 06/04/2017   Degenerative tear of meniscus of left knee 02/14/2015   Acid reflux 01/27/2015  Adenomatous colon polyp 01/27/2015   Allergy 01/27/2015   Arteriosclerosis of coronary artery 01/27/2015   Lumbar radiculopathy 01/27/2015   CAD in native artery 01/07/2014   Benign essential HTN 10/21/2013   HLD (hyperlipidemia) 10/21/2013    PCP: Dr. Dellar Fenton    REFERRING PROVIDER: Dr. Dellar Fenton    REFERRING DIAG: [phone] (ICD-10-CM) - Left shoulder pain, unspecified chronicity  THERAPY DIAG:  Acute pain of left shoulder  Rationale for Evaluation and Treatment: Rehabilitation  ONSET DATE: 09/30/23  SUBJECTIVE:                                                                                                                                                                                       SUBJECTIVE STATEMENT: Pt reports that he has some soreness in his left shoulder from putting up fence for his garden. Otherwise he has been doing well.   Hand dominance: Right  PERTINENT HISTORY: Pt reports that he started experiencing left shoulder pain last month that developed insidiously. He is not sure what caused it and he experiences most of his pain when driving and resting his left arm. 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   PAIN:  Are you having pain? Yes: NPRS scale: 1-2/10 Pain location: Anterior surface of left glenohumeral joint  Pain description: Achy  Aggravating factors: Putting weight on it like resting on car door  Relieving factors: Blue rub  PRECAUTIONS: None  RED FLAGS: None   WEIGHT BEARING RESTRICTIONS: No  FALLS:  Has patient fallen in last 6 months? No  LIVING ENVIRONMENT: Lives with: lives with their spouse Lives in: House/apartment Stairs: Yes: Internal: 13 steps; on right going up, on left going up, and can reach both Has following equipment at home: None  OCCUPATION: Educational psychologist    PLOF: Independent  PATIENT GOALS: To feel less left shoulder pain  NEXT MD VISIT: Not sure   OBJECTIVE:  Note: Objective measures were completed at Evaluation unless otherwise noted.  VITALS BP 162/77  HR 60 SpO2 96%  DIAGNOSTIC FINDINGS:  None listed    PATIENT SURVEYS:  Quick Dash 18.2%  COGNITION: Overall cognitive status: Within functional limits for tasks assessed     SENSATION: WFL  POSTURE: Rounded shoulders    UPPER EXTREMITY ROM:   Active ROM Right eval Left eval  Shoulder flexion 180 180  Shoulder extension    Shoulder abduction 180 180*  Shoulder adduction    Shoulder internal rotation    Shoulder external rotation    Elbow flexion    Elbow  extension    Wrist flexion  Wrist extension    Wrist ulnar deviation    Wrist radial deviation    Wrist pronation    Wrist supination    (Blank rows = not tested)   UPPER EXTREMITY MMT:  MMT Right eval Left eval  Shoulder flexion 4+ 4+  Shoulder extension 4 4  Shoulder abduction 4+ 4+  Shoulder adduction    Shoulder internal rotation 4+ 4+  Shoulder external rotation 4+ 4+*  Middle trapezius 4 4  Lower trapezius 4- 4-  Elbow flexion    Elbow extension    Wrist flexion    Wrist extension    Wrist ulnar deviation    Wrist radial deviation    Wrist pronation    Wrist supination    Grip strength (lbs)    (Blank rows = not tested)  SHOULDER SPECIAL TESTS: Impingement tests: Neer impingement test: positive , Hawkins/Kennedy impingement test: negative, and Painful arc test: positive  Rotator cuff assessment: Drop arm test: negative, Empty can test: positive , and Infraspinatus test: negative Biceps assessment: Speed's test: positive   JOINT MOBILITY TESTING:  NT  PALPATION:  Anterior portion of left shoulder TTP                                                                                                                              TREATMENT DATE:   11/19/23: All single UE exercises performed on LUE   THEREX:  UBE seat at 10 with resistance for 4 - 2.5 min forward and 2.5 backward   OMEGA Seated Rows #35 1 x 10  OMEGA Seated Rows #40 2 x 10  OMEGA Lat Pull Down #25 3 x 10  Seated Shoulder Flexion Eccentric with #2 DB 1 x 10  -min VC to decrease the speed of the eccentric portion.  Standing Eccentric Shoulder Abduction AROM 1 x 10   Standing Eccentric Shoulder Abduction #1 DB 1 x 10  -Pt reports a slight increase in the anterior surface of his left shoulder.  Seated Eccentric Shoulder Abduction #1 DB 1 x 10  -min VC to increase external rotation of hand with thumbs up    11/12/23: All single UE exercses performed on LUE  THEREX:  UBE seat at 10 with  resistance for 4 - 2.5 min forward and 2.5 backward   Wall Slides into low trap setting 3 x 10  -mod VC to avoid lumbar and thoracic extension.  Seated Thoracic Extension with 3 sec hold 2 x 10  OMEGA Seated Rows #25 lbs 1 x 10  OMEGA Seated Rows #35 lbs 2 x 10  OMEGA Lat Pull Down #25 3 x 10  -min VC for sequence of exercise   11/07/23: All single UE exercses performed on LUE  THEREX:  Eccentric Shoulder Flexion on LUE with yellow band 1 x 10 -Pt reports increased shoulder pain at 120 degrees when extending shoulder Eccentric Shoulder Abduction AROM to 160 deg 3 x 10  -mod VC  to decreased shoulder extension and to get close to wall when sliding up   NMR  Mirror provided visual input for improved neuromuscular control   Eccentric Shoulder Flexion on LUE AROM 1 x 10  -min VC to improve scapular retraction   Eccentric Shoulder Flexion on LUE #1 DB 1 x 10   Eccentric Shoulder Flexion on LUE #2 DB 1 x 10     11/05/23: THEREX: All single UE exercises performed on LUE  UBE 2.5 min forward and 2.5 min backward  Shoulder Horizontal Abduction (T's)1 x 10  -Pt reports anterior shoulder pain over left shoulder  Corner Pec Stretch at 45 degrees 2 x 60 sec  Bent Over T's #3 DB 1 x 10  -Pt reports increased stiffness Bent Over T's with #1 DB 2 x 10  Standing Posterior Capsule Stretch 3 x 30 sec   Bent Over T's with #1 DB 1 x 10  -Pt able to achieve improved range of motion  Upper Trap Stretch 3 x 30 sec   -min VC for sequence of exercise  10/31/23 (eval): Eccentric left shoulder flexion with yellow band 2 x 10     PATIENT EDUCATION: Education details: Form and technique and explanation of shoulder impingement and biceps tendinitis  Person educated: Patient Education method: Programmer, multimedia, Demonstration, Verbal cues, and Handouts Education comprehension: verbalized understanding, returned demonstration, and verbal cues required  HOME EXERCISE PROGRAM: Access Code: [email or web address] URL:  [email or web address] Date: 11/19/2023 Prepared by: Marge Shed  Exercises - Seated Upper Trapezius Stretch  - 1 x daily - 7 x weekly - 3 reps - 60 sec hold - Standing Sleeper Stretch at [employer]  - 1 x daily - 7 x weekly - 3 reps - 60 sec  hold - Doorway Pec Stretch at 60 Elevation  - 1 x daily - 7 x weekly - 3 reps - 60 sec  hold - Seated Thoracic Lumbar Extension  - 1 x daily - 7 x weekly - 2 sets - 10 reps - 3 sec  hold - Low Trap Setting at Wall  - 3-4 x weekly - 3 sets - 10 reps - Standing Shoulder Flexion with Resistance with Dumbbell instead of Band   - 3-4 x weekly - 3 sets - 10 reps - Single Arm Bent Over Shoulder Horizontal Abduction with Dumbbell - Palm Down  - 3-4 x weekly - 3 sets - 10 reps - Shoulder Abduction- Slide Up Wall, Step Away and Bring Weight Down Slow   - 3-4 x weekly - 3 sets - 10 reps  Patient Education - Shoulder Impingement  ASSESSMENT:  CLINICAL IMPRESSION: Pt somewhat limited by shoulder pain especially with eccentric shoulder abduction exercise. Exercise modified to have pt perform shoulder abduction in plane of scaption to avoid probably impingment and he responded with a decrease in pain provocation. PT modified pt's frequency with a decrease to 1x per week given ongoing progress towards goals. He will continue benefit from skilled PT to address the aforementioned deficits to complete activities like driving and reaching for objects at work without pain and discomfort.    OBJECTIVE IMPAIRMENTS: decreased strength, impaired UE functional use, postural dysfunction, and pain.   ACTIVITY LIMITATIONS: carrying, lifting, and reach over head  PARTICIPATION LIMITATIONS: driving, occupation, and yard work  PERSONAL FACTORS: 3+ comorbidities: HTN, T2DM, and COPD are also affecting patient's functional outcome.   REHAB POTENTIAL: Good  CLINICAL DECISION MAKING: Stable/uncomplicated  EVALUATION COMPLEXITY: Low   GOALS: Goals reviewed with  patient? No  SHORT TERM GOALS: Target date: 11/15/2023  Patient will demonstrate undestanding of home exercise plan by performing exercises correctly with evidence of good carry over with min to no verbal or tactile cues .   Baseline: NT 11/05/23: Able to perform exercises without difficulty.  Goal status: ONGOING      LONG TERM GOALS: Target date: 01/10/2024  Patient will improve left shoulder function as evidenced by an improvement in QuickDash score of >=10 pts to return to using LUE more reliably for driving and work related tasks. Baseline: 18 pt or 18% Goal status: ONGOING   2. Patient will exhibit symmetrical left shoulder and periscapular strength to right shoulder strength for improved UE function to perform driving and lifting tasks without pain or discomfort  Baseline: Mid Trap R/L 4/4, Lower Trap R/L 4-/4-, Shoulder Extension R/L 4/4  Goal status: ONGOING     PLAN:  PT FREQUENCY: 1-2x/week  PT DURATION: 10 weeks  PLANNED INTERVENTIONS: 97164- PT Re-evaluation, 97110-Therapeutic exercises, 97530- Therapeutic activity, 97112- Neuromuscular re-education, 97535- Self Care, [id number]- Manual therapy, [id number]- Aquatic Therapy, [id number]- Electrical stimulation (unattended), [phone]- Electrical stimulation (manual), Patient/Family education, Dry Needling, Joint mobilization, Joint manipulation, Spinal manipulation, Spinal mobilization, DME instructions, Cryotherapy, Moist heat, and Biofeedback  PLAN FOR NEXT SESSION: Standing single UE rows. Continue to progress periscapular exercises: standing T's/ bent over T's and serratus exercise-push up with plus or V's against wall    Marge Shed PT, DPT  [facility] [address] [place], [address] Phone: [phone]   Fax:  [phone]

## 2023-11-21 ENCOUNTER — Ambulatory Visit: Admitting: Physical Therapy

## 2023-11-25 ENCOUNTER — Ambulatory Visit (INDEPENDENT_AMBULATORY_CARE_PROVIDER_SITE_OTHER)

## 2023-11-25 DIAGNOSIS — E538 Deficiency of other specified B group vitamins: Secondary | ICD-10-CM | POA: Diagnosis not present

## 2023-11-25 MED ORDER — CYANOCOBALAMIN 1000 MCG/ML IJ SOLN
1000.0000 ug | Freq: Once | INTRAMUSCULAR | Status: AC
  Administered 2023-11-25: 1000 ug via INTRAMUSCULAR

## 2023-11-25 NOTE — Progress Notes (Signed)
 Pt presented for their vitamin B12 injection. Pt was identified through two identifiers. Pt tolerated shot well in their right deltoid.

## 2023-11-26 ENCOUNTER — Ambulatory Visit: Admitting: Physical Therapy

## 2023-11-26 DIAGNOSIS — M25512 Pain in left shoulder: Secondary | ICD-10-CM

## 2023-11-26 NOTE — Therapy (Signed)
 OUTPATIENT PHYSICAL THERAPY SHOULDER TREATMENT    Patient Name: James Peck MRN: [id number] DOB:03/24/1941, 83 y.o., male Today's Date: 11/26/2023  END OF SESSION:  PT End of Session - 11/26/23 1302     Visit Number 6    Number of Visits 20    Date for PT Re-Evaluation 01/09/24    Authorization Type [id number]    Authorization - Visit Number 6    Authorization - Number of Visits 20    Progress Note Due on Visit 10    PT Start Time 1300    PT Stop Time 1345    PT Time Calculation (min) 45 min    Activity Tolerance Patient tolerated treatment well    Behavior During Therapy WFL for tasks assessed/performed              Past Medical History:  Diagnosis Date   Allergy    Arthritis    Coronary artery disease    GERD (gastroesophageal reflux disease)    History of hiatal hernia    Hypercholesteremia    Hypertension    Past Surgical History:  Procedure Laterality Date   CARDIAC CATHETERIZATION     cardiac stents     CATARACT EXTRACTION W/PHACO Right 09/10/2017   Procedure: CATARACT EXTRACTION PHACO AND INTRAOCULAR LENS PLACEMENT (IOC);  Surgeon: Clair Crews, MD;  Location: [facility] OR[facility];  Service: Ophthalmology;  Laterality: Right;  US  01:01.7 AP% 15.3 CDE 9.38 Fluid pack lot # [id number]   CATARACT EXTRACTION W/PHACO Left 10/02/2017   Procedure: CATARACT EXTRACTION PHACO AND INTRAOCULAR LENS PLACEMENT (IOC);  Surgeon: Clair Crews, MD;  Location: [facility];  Service: Ophthalmology;  Laterality: Left;  US  00:42.3 AP% 16.2 CDE 6.85 Fluid Pack Lot # [id number]   COLONOSCOPY WITH PROPOFOL  N/A 06/28/2017   Procedure: COLONOSCOPY WITH PROPOFOL ;  Surgeon: Cassie Click, MD;  Location: [facility];  Service: Endoscopy;  Laterality: N/A;   COLONOSCOPY WITH PROPOFOL  N/A 12/12/2021   Procedure: COLONOSCOPY WITH PROPOFOL ;  Surgeon: Shane Darling, MD;  Location: [facility] ENDOSCOPY;  Service: Endoscopy;  Laterality: N/A;   CORONARY ANGIOPLASTY     STENTS X 2   EYE  SURGERY     KNEE ARTHROSCOPY     LEFT HEART CATH AND CORONARY ANGIOGRAPHY N/A 09/12/2020   Procedure: LEFT HEART CATH AND CORONARY ANGIOGRAPHY;  Surgeon: Percival Brace, MD;  Location: [facility];  Service: Cardiovascular;  Laterality: N/A;   Patient Active Problem List   Diagnosis Date Noted   Shoulder pain, left 10/23/2023   Memory change 06/21/2023   Diabetes mellitus type 2 with complications (HCC) 01/07/2023   BPH (benign prostatic hyperplasia) 11/15/2022   Asthma-COPD overlap syndrome (HCC) 01/31/2022   Bronchiectasis without complication (HCC) 01/31/2022   Hematuria 10/01/2021   History of colon polyps 08/06/2021   Hyponatremia 04/09/2021   Subclavian artery stenosis, left (HCC) 02/04/2021   Carotid artery disease (HCC) 02/04/2021   Coronary artery disease    Chest pain    NSTEMI (non-ST elevated myocardial infarction) (HCC)    COPD exacerbation (HCC)    Allergic rhinitis 04/12/2020   Emphysema, unspecified (HCC) 04/12/2020   Former smoker 04/12/2020   Lung nodules 03/27/2020   Renal lesion 03/27/2020   Abnormal abdominal CT scan 02/16/2020   Right rotator cuff tear 08/19/2019   Cough 06/30/2019   Healthcare maintenance 08/28/2018   Hyperglycemia 12/22/2017   Degenerative arthritis of left knee 06/04/2017   Degenerative tear of meniscus of left knee 02/14/2015   Acid reflux 01/27/2015  Adenomatous colon polyp 01/27/2015   Allergy 01/27/2015   Arteriosclerosis of coronary artery 01/27/2015   Lumbar radiculopathy 01/27/2015   CAD in native artery 01/07/2014   Benign essential HTN 10/21/2013   HLD (hyperlipidemia) 10/21/2013    PCP: Dr. Dellar Fenton    REFERRING PROVIDER: Dr. Dellar Fenton    REFERRING DIAG: [phone] (ICD-10-CM) - Left shoulder pain, unspecified chronicity  THERAPY DIAG:  Acute pain of left shoulder  Rationale for Evaluation and Treatment: Rehabilitation  ONSET DATE: 09/30/23  SUBJECTIVE:                                                                                                                                                                                       SUBJECTIVE STATEMENT: Pt states that his shoulder feels sore from time to time but that he is currently not feeling any pain.   Hand dominance: Right  PERTINENT HISTORY: Pt reports that he started experiencing left shoulder pain last month that developed insidiously. He is not sure what caused it and he experiences most of his pain when driving and resting his left arm. 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   PAIN:  Are you having pain? Yes: NPRS scale: 0/10  Pain location: Anterior surface of left glenohumeral joint  Pain description: Achy  Aggravating factors: Putting weight on it like resting on car door  Relieving factors: Blue rub  PRECAUTIONS: None  RED FLAGS: None   WEIGHT BEARING RESTRICTIONS: No  FALLS:  Has patient fallen in last 6 months? No  LIVING ENVIRONMENT: Lives with: lives with their spouse Lives in: House/apartment Stairs: Yes: Internal: 13 steps; on right going up, on left going up, and can reach both Has following equipment at home: None  OCCUPATION: Educational psychologist    PLOF: Independent  PATIENT GOALS: To feel less left shoulder pain  NEXT MD VISIT: Not sure   OBJECTIVE:  Note: Objective measures were completed at Evaluation unless otherwise noted.  VITALS BP 162/77  HR 60 SpO2 96%  DIAGNOSTIC FINDINGS:  None listed    PATIENT SURVEYS:  Quick Dash 18.2%  COGNITION: Overall cognitive status: Within functional limits for tasks assessed     SENSATION: WFL  POSTURE: Rounded shoulders    UPPER EXTREMITY ROM:   Active ROM Right eval Left eval  Shoulder flexion 180 180  Shoulder extension    Shoulder abduction 180 180*  Shoulder adduction    Shoulder internal rotation    Shoulder external rotation    Elbow flexion    Elbow extension    Wrist flexion     Wrist extension  Wrist ulnar deviation    Wrist radial deviation    Wrist pronation    Wrist supination    (Blank rows = not tested)   UPPER EXTREMITY MMT:  MMT Right eval Left eval  Shoulder flexion 4+ 4+  Shoulder extension 4 4  Shoulder abduction 4+ 4+  Shoulder adduction    Shoulder internal rotation 4+ 4+  Shoulder external rotation 4+ 4+*  Middle trapezius 4 4  Lower trapezius 4- 4-  Elbow flexion    Elbow extension    Wrist flexion    Wrist extension    Wrist ulnar deviation    Wrist radial deviation    Wrist pronation    Wrist supination    Grip strength (lbs)    (Blank rows = not tested)  SHOULDER SPECIAL TESTS: Impingement tests: Neer impingement test: positive , Hawkins/Kennedy impingement test: negative, and Painful arc test: positive  Rotator cuff assessment: Drop arm test: negative, Empty can test: positive , and Infraspinatus test: negative Biceps assessment: Speed's test: positive   JOINT MOBILITY TESTING:  NT  PALPATION:  Anterior portion of left shoulder TTP                                                                                                                              TREATMENT DATE:   11/26/23: THEREX: All single UE exercises performed on LUE  UBE seat at 10 with resistance for 4 - 2.5 min forward and 2.5 backward  OMEGA Single Arm Rows  #5 1 x 10  OMEGA Single Arm Rows  #10 2 x 10  OMEGA Single Arm Lat Pull Down #10 3 x 10  OMEGA Single Shoulder ER at 0 deg Abd #5  2 X 10  -Pt describes feeling shortness of breath and unable to perform third set due left shoulder fatigue  Seated Eccentric shoulder flexion on LUE #2 DB 3 x 10  Standing Eccentric shoulder abduction on LUE AROM 1 x 10   Standing Eccentric shoulder abduction on LUE #1 DB 1 x 10  -min VC to keep thumb pointed upwards   11/19/23: All single UE exercises performed on LUE   THEREX:  UBE seat at 10 with resistance for 4 - 2.5 min forward and 2.5 backward    OMEGA Seated Rows #35 1 x 10  OMEGA Seated Rows #40 2 x 10  OMEGA Lat Pull Down #25 3 x 10  Seated Shoulder Flexion Eccentric with #2 DB 1 x 10  -min VC to decrease the speed of the eccentric portion.  Standing Eccentric Shoulder Abduction AROM 1 x 10   Standing Eccentric Shoulder Abduction #1 DB 1 x 10  -Pt reports a slight increase in the anterior surface of his left shoulder.  Seated Eccentric Shoulder Abduction #1 DB 1 x 10  -min VC to increase external rotation of hand with thumbs up    11/12/23: All single UE exercses performed on LUE  THEREX:  UBE seat at 10 with resistance for 4 - 2.5 min forward and 2.5 backward   Wall Slides into low trap setting 3 x 10  -mod VC to avoid lumbar and thoracic extension.  Seated Thoracic Extension with 3 sec hold 2 x 10  OMEGA Seated Rows #25 lbs 1 x 10  OMEGA Seated Rows #35 lbs 2 x 10  OMEGA Lat Pull Down #25 3 x 10  -min VC for sequence of exercise   11/07/23: All single UE exercses performed on LUE  THEREX:  Eccentric Shoulder Flexion on LUE with yellow band 1 x 10 -Pt reports increased shoulder pain at 120 degrees when extending shoulder Eccentric Shoulder Abduction AROM to 160 deg 3 x 10  -mod VC to decreased shoulder extension and to get close to wall when sliding up   NMR  Mirror provided visual input for improved neuromuscular control   Eccentric Shoulder Flexion on LUE AROM 1 x 10  -min VC to improve scapular retraction   Eccentric Shoulder Flexion on LUE #1 DB 1 x 10   Eccentric Shoulder Flexion on LUE #2 DB 1 x 10     11/05/23: THEREX: All single UE exercises performed on LUE  UBE 2.5 min forward and 2.5 min backward  Shoulder Horizontal Abduction (T's)1 x 10  -Pt reports anterior shoulder pain over left shoulder  Corner Pec Stretch at 45 degrees 2 x 60 sec  Bent Over T's #3 DB 1 x 10  -Pt reports increased stiffness Bent Over T's with #1 DB 2 x 10  Standing Posterior Capsule Stretch 3 x 30 sec   Bent Over T's with #1  DB 1 x 10  -Pt able to achieve improved range of motion  Upper Trap Stretch 3 x 30 sec   -min VC for sequence of exercise  10/31/23 (eval): Eccentric left shoulder flexion with yellow band 2 x 10     PATIENT EDUCATION: Education details: Form and technique and explanation of shoulder impingement and biceps tendinitis  Person educated: Patient Education method: Programmer, multimedia, Demonstration, Verbal cues, and Handouts Education comprehension: verbalized understanding, returned demonstration, and verbal cues required  HOME EXERCISE PROGRAM: Access Code: [email or web address] URL: [email or web address] Date: 11/26/2023 Prepared by: Marge Shed  Exercises - Seated Upper Trapezius Stretch  - 1 x daily - 7 x weekly - 3 reps - 60 sec hold - Doorway Pec Stretch at 60 Elevation  - 1 x daily - 7 x weekly - 3 reps - 60 sec  hold - Low Trap Setting at Wall  - 3-4 x weekly - 3 sets - 10 reps - Standing Shoulder Flexion with Resistance with Dumbbell instead of Band   - 3-4 x weekly - 3 sets - 10 reps - Shoulder Abduction- Slide Up Wall, Step Away and Bring Weight Down Slow   - 3-4 x weekly - 3 sets - 10 reps   ASSESSMENT:  CLINICAL IMPRESSION: Pt continues to make ongoing progress towards goals with improved periscapular strength and decreased pain with shoulder eccentric exercises. He was able to complete single arm periscapular strengthening exercises for the first time and without compensation.  He will continue benefit from skilled PT to address the aforementioned deficits to complete activities like driving and reaching for objects at work without pain and discomfort.    OBJECTIVE IMPAIRMENTS: decreased strength, impaired UE functional use, postural dysfunction, and pain.   ACTIVITY LIMITATIONS: carrying, lifting, and reach over head  PARTICIPATION LIMITATIONS: driving, occupation, and yard work  PERSONAL FACTORS:  3+ comorbidities: HTN, T2DM, and COPD are also affecting patient's  functional outcome.   REHAB POTENTIAL: Good  CLINICAL DECISION MAKING: Stable/uncomplicated  EVALUATION COMPLEXITY: Low   GOALS: Goals reviewed with patient? No  SHORT TERM GOALS: Target date: 11/15/2023  Patient will demonstrate undestanding of home exercise plan by performing exercises correctly with evidence of good carry over with min to no verbal or tactile cues .   Baseline: NT 11/05/23: Able to perform exercises without difficulty.  Goal status: ONGOING      LONG TERM GOALS: Target date: 01/10/2024  Patient will improve left shoulder function as evidenced by an improvement in QuickDash score of >=10 pts to return to using LUE more reliably for driving and work related tasks. Baseline: 18 pt or 18% Goal status: ONGOING   2. Patient will exhibit symmetrical left shoulder and periscapular strength to right shoulder strength for improved UE function to perform driving and lifting tasks without pain or discomfort  Baseline: Mid Trap R/L 4/4, Lower Trap R/L 4-/4-, Shoulder Extension R/L 4/4  Goal status: ONGOING     PLAN:  PT FREQUENCY: 1-2x/week  PT DURATION: 10 weeks  PLANNED INTERVENTIONS: 97164- PT Re-evaluation, 97110-Therapeutic exercises, 97530- Therapeutic activity, 97112- Neuromuscular re-education, 97535- Self Care, 16109- Manual therapy, [id number]- Aquatic Therapy, [id number]- Electrical stimulation (unattended), [phone]- Electrical stimulation (manual), Patient/Family education, Dry Needling, Joint mobilization, Joint manipulation, Spinal manipulation, Spinal mobilization, DME instructions, Cryotherapy, Moist heat, and Biofeedback  PLAN FOR NEXT SESSION: Standing T's and attempt lower trap wall setting with added resistance around BUE. Continue to progress eccentric shoulder abduction and flexion.    Marge Shed PT, DPT  [facility] [address] [place], [address] Phone: [phone]   Fax:  [phone]

## 2023-12-04 ENCOUNTER — Ambulatory Visit: Attending: Internal Medicine | Admitting: Physical Therapy

## 2023-12-04 DIAGNOSIS — L820 Inflamed seborrheic keratosis: Secondary | ICD-10-CM | POA: Diagnosis not present

## 2023-12-04 DIAGNOSIS — M25512 Pain in left shoulder: Secondary | ICD-10-CM | POA: Diagnosis not present

## 2023-12-04 DIAGNOSIS — D2262 Melanocytic nevi of left upper limb, including shoulder: Secondary | ICD-10-CM | POA: Diagnosis not present

## 2023-12-04 DIAGNOSIS — L570 Actinic keratosis: Secondary | ICD-10-CM | POA: Diagnosis not present

## 2023-12-04 DIAGNOSIS — L538 Other specified erythematous conditions: Secondary | ICD-10-CM | POA: Diagnosis not present

## 2023-12-04 DIAGNOSIS — D2272 Melanocytic nevi of left lower limb, including hip: Secondary | ICD-10-CM | POA: Diagnosis not present

## 2023-12-04 DIAGNOSIS — R208 Other disturbances of skin sensation: Secondary | ICD-10-CM | POA: Diagnosis not present

## 2023-12-04 DIAGNOSIS — D2261 Melanocytic nevi of right upper limb, including shoulder: Secondary | ICD-10-CM | POA: Diagnosis not present

## 2023-12-04 DIAGNOSIS — D2271 Melanocytic nevi of right lower limb, including hip: Secondary | ICD-10-CM | POA: Diagnosis not present

## 2023-12-04 NOTE — Therapy (Signed)
 OUTPATIENT PHYSICAL THERAPY SHOULDER TREATMENT    Patient Name: James Peck MRN: [id number] DOB:Oct 22, 1940, 83 y.o., male Today's Date: 12/04/2023  END OF SESSION:  PT End of Session - 12/04/23 1607     Visit Number 7    Number of Visits 20    Date for PT Re-Evaluation 01/09/24    Authorization Type [id number]    Authorization - Visit Number 7    Authorization - Number of Visits 20    Progress Note Due on Visit 10    PT Start Time 1605    PT Stop Time 1645    PT Time Calculation (min) 40 min    Activity Tolerance Patient tolerated treatment well    Behavior During Therapy WFL for tasks assessed/performed               Past Medical History:  Diagnosis Date   Allergy    Arthritis    Coronary artery disease    GERD (gastroesophageal reflux disease)    History of hiatal hernia    Hypercholesteremia    Hypertension    Past Surgical History:  Procedure Laterality Date   CARDIAC CATHETERIZATION     cardiac stents     CATARACT EXTRACTION W/PHACO Right 09/10/2017   Procedure: CATARACT EXTRACTION PHACO AND INTRAOCULAR LENS PLACEMENT (IOC);  Surgeon: Clair Crews, MD;  Location: [facility];  Service: Ophthalmology;  Laterality: Right;  US  01:01.7 AP% 15.3 CDE 9.38 Fluid pack lot # [id number]   CATARACT EXTRACTION W/PHACO Left 10/02/2017   Procedure: CATARACT EXTRACTION PHACO AND INTRAOCULAR LENS PLACEMENT (IOC);  Surgeon: Clair Crews, MD;  Location: [facility];  Service: Ophthalmology;  Laterality: Left;  US  00:42.3 AP% 16.2 CDE 6.85 Fluid Pack Lot # [id number]   COLONOSCOPY WITH PROPOFOL  N/A 06/28/2017   Procedure: COLONOSCOPY WITH PROPOFOL ;  Surgeon: Cassie Click, MD;  Location: [facility];  Service: Endoscopy;  Laterality: N/A;   COLONOSCOPY WITH PROPOFOL  N/A 12/12/2021   Procedure: COLONOSCOPY WITH PROPOFOL ;  Surgeon: Shane Darling, MD;  Location: [facility];  Service: Endoscopy;  Laterality: N/A;   CORONARY ANGIOPLASTY     STENTS X 2   EYE  SURGERY     KNEE ARTHROSCOPY     LEFT HEART CATH AND CORONARY ANGIOGRAPHY N/A 09/12/2020   Procedure: LEFT HEART CATH AND CORONARY ANGIOGRAPHY;  Surgeon: Percival Brace, MD;  Location: [facility];  Service: Cardiovascular;  Laterality: N/A;   Patient Active Problem List   Diagnosis Date Noted   Shoulder pain, left 10/23/2023   Memory change 06/21/2023   Diabetes mellitus type 2 with complications (HCC) 01/07/2023   BPH (benign prostatic hyperplasia) 11/15/2022   Asthma-COPD overlap syndrome (HCC) 01/31/2022   Bronchiectasis without complication (HCC) 01/31/2022   Hematuria 10/01/2021   History of colon polyps 08/06/2021   Hyponatremia 04/09/2021   Subclavian artery stenosis, left (HCC) 02/04/2021   Carotid artery disease (HCC) 02/04/2021   Coronary artery disease    Chest pain    NSTEMI (non-ST elevated myocardial infarction) (HCC)    COPD exacerbation (HCC)    Allergic rhinitis 04/12/2020   Emphysema, unspecified (HCC) 04/12/2020   Former smoker 04/12/2020   Lung nodules 03/27/2020   Renal lesion 03/27/2020   Abnormal abdominal CT scan 02/16/2020   Right rotator cuff tear 08/19/2019   Cough 06/30/2019   Healthcare maintenance 08/28/2018   Hyperglycemia 12/22/2017   Degenerative arthritis of left knee 06/04/2017   Degenerative tear of meniscus of left knee 02/14/2015   Acid reflux  01/27/2015   Adenomatous colon polyp 01/27/2015   Allergy 01/27/2015   Arteriosclerosis of coronary artery 01/27/2015   Lumbar radiculopathy 01/27/2015   CAD in native artery 01/07/2014   Benign essential HTN 10/21/2013   HLD (hyperlipidemia) 10/21/2013    PCP: Dr. Dellar Fenton    REFERRING PROVIDER: Dr. Dellar Fenton    REFERRING DIAG: [id number] (ICD-10-CM) - Left shoulder pain, unspecified chronicity  THERAPY DIAG:  Acute pain of left shoulder  Rationale for Evaluation and Treatment: Rehabilitation  ONSET DATE: 09/30/23  SUBJECTIVE:                                                                                                                                                                                       SUBJECTIVE STATEMENT: Pt reports that he has not noticed much pain in the shoulder this past week even after using it a lot shoveling mulch.   Hand dominance: Right  PERTINENT HISTORY: Pt reports that he started experiencing left shoulder pain last month that developed insidiously. He is not sure what caused it and he experiences most of his pain when driving and resting his left arm. 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   PAIN:  Are you having pain? Yes: NPRS scale: 0/10  Pain location: Anterior surface of left glenohumeral joint  Pain description: Achy  Aggravating factors: Putting weight on it like resting on car door  Relieving factors: Blue rub  PRECAUTIONS: None  RED FLAGS: None   WEIGHT BEARING RESTRICTIONS: No  FALLS:  Has patient fallen in last 6 months? No  LIVING ENVIRONMENT: Lives with: lives with their spouse Lives in: House/apartment Stairs: Yes: Internal: 13 steps; on right going up, on left going up, and can reach both Has following equipment at home: None  OCCUPATION: Educational psychologist    PLOF: Independent  PATIENT GOALS: To feel less left shoulder pain  NEXT MD VISIT: Not sure   OBJECTIVE:  Note: Objective measures were completed at Evaluation unless otherwise noted.  VITALS BP 162/77  HR 60 SpO2 96%  DIAGNOSTIC FINDINGS:  None listed    PATIENT SURVEYS:  Quick Dash 18.2%  COGNITION: Overall cognitive status: Within functional limits for tasks assessed     SENSATION: WFL  POSTURE: Rounded shoulders    UPPER EXTREMITY ROM:   Active ROM Right eval Left eval  Shoulder flexion 180 180  Shoulder extension    Shoulder abduction 180 180*  Shoulder adduction    Shoulder internal rotation    Shoulder external rotation    Elbow flexion    Elbow extension     Wrist flexion  Wrist extension    Wrist ulnar deviation    Wrist radial deviation    Wrist pronation    Wrist supination    (Blank rows = not tested)   UPPER EXTREMITY MMT:  MMT Right eval Left eval  Shoulder flexion 4+ 4+  Shoulder extension 4 4  Shoulder abduction 4+ 4+  Shoulder adduction    Shoulder internal rotation 4+ 4+  Shoulder external rotation 4+ 4+*  Middle trapezius 4 4  Lower trapezius 4- 4-  Elbow flexion    Elbow extension    Wrist flexion    Wrist extension    Wrist ulnar deviation    Wrist radial deviation    Wrist pronation    Wrist supination    Grip strength (lbs)    (Blank rows = not tested)  SHOULDER SPECIAL TESTS: Impingement tests: Neer impingement test: positive , Hawkins/Kennedy impingement test: negative, and Painful arc test: positive  Rotator cuff assessment: Drop arm test: negative, Empty can test: positive , and Infraspinatus test: negative Biceps assessment: Speed's test: positive   JOINT MOBILITY TESTING:  NT  PALPATION:  Anterior portion of left shoulder TTP                                                                                                                              TREATMENT DATE:   12/04/23: THEREX: All single UE exercises performed on LUE  UBE seat at 10 with resistance for 4 - 2.5 min forward and 2.5 backward  OMEGA Single Arm Rows #10 1 x 10  OMEGA Single Arm Rows #13 2 x 10  OMEGA Single Arm Lat Pull downs #13 DB  1 X 10  Shoulder Horizontal Abduction AROM 1 x 10  Shoulder Horizontal Abduction with #1 DB 2 x 10 -min VC to decrease shoulder elevation to avoid compensation  Eccentric shoulder flexion with AAROM for concentric phase  #2 DB 2 X 10  Eccentric shoulder flexion #2 DB with emphasis on slow for the eccentric phase 1 x 10  Eccentric shoulder abduction with AAROM for concentric phase  #2 DB 2 X 10  Eccentric shoulder abduction with #2 DB with emphasis on slow for the eccentric phase 1 x 10     11/26/23: THEREX: All single UE exercises performed on LUE  UBE seat at 10 with resistance for 4 - 2.5 min forward and 2.5 backward  OMEGA Single Arm Rows  #5 1 x 10  OMEGA Single Arm Rows  #10 2 x 10  OMEGA Single Arm Lat Pull Down #10 3 x 10  OMEGA Single Shoulder ER at 0 deg Abd #5  2 X 10  -Pt describes feeling shortness of breath and unable to perform third set due left shoulder fatigue  Seated Eccentric shoulder flexion on LUE #2 DB 3 x 10  Standing Eccentric shoulder abduction on LUE AROM 1 x 10   Standing Eccentric shoulder abduction on LUE #1 DB 1 x  10  -min VC to keep thumb pointed upwards   11/19/23: All single UE exercises performed on LUE   THEREX:  UBE seat at 10 with resistance for 4 - 2.5 min forward and 2.5 backward   OMEGA Seated Rows #35 1 x 10  OMEGA Seated Rows #40 2 x 10  OMEGA Lat Pull Down #25 3 x 10  Seated Shoulder Flexion Eccentric with #2 DB 1 x 10  -min VC to decrease the speed of the eccentric portion.  Standing Eccentric Shoulder Abduction AROM 1 x 10   Standing Eccentric Shoulder Abduction #1 DB 1 x 10  -Pt reports a slight increase in the anterior surface of his left shoulder.  Seated Eccentric Shoulder Abduction #1 DB 1 x 10  -min VC to increase external rotation of hand with thumbs up    11/12/23: All single UE exercses performed on LUE  THEREX:  UBE seat at 10 with resistance for 4 - 2.5 min forward and 2.5 backward   Wall Slides into low trap setting 3 x 10  -mod VC to avoid lumbar and thoracic extension.  Seated Thoracic Extension with 3 sec hold 2 x 10  OMEGA Seated Rows #25 lbs 1 x 10  OMEGA Seated Rows #35 lbs 2 x 10  OMEGA Lat Pull Down #25 3 x 10  -min VC for sequence of exercise   11/07/23: All single UE exercses performed on LUE  THEREX:  Eccentric Shoulder Flexion on LUE with yellow band 1 x 10 -Pt reports increased shoulder pain at 120 degrees when extending shoulder Eccentric Shoulder Abduction AROM to 160 deg 3 x 10  -mod  VC to decreased shoulder extension and to get close to wall when sliding up   NMR  Mirror provided visual input for improved neuromuscular control   Eccentric Shoulder Flexion on LUE AROM 1 x 10  -min VC to improve scapular retraction   Eccentric Shoulder Flexion on LUE #1 DB 1 x 10   Eccentric Shoulder Flexion on LUE #2 DB 1 x 10     11/05/23: THEREX: All single UE exercises performed on LUE  UBE 2.5 min forward and 2.5 min backward  Shoulder Horizontal Abduction (T's)1 x 10  -Pt reports anterior shoulder pain over left shoulder  Corner Pec Stretch at 45 degrees 2 x 60 sec  Bent Over T's #3 DB 1 x 10  -Pt reports increased stiffness Bent Over T's with #1 DB 2 x 10  Standing Posterior Capsule Stretch 3 x 30 sec   Bent Over T's with #1 DB 1 x 10  -Pt able to achieve improved range of motion  Upper Trap Stretch 3 x 30 sec   -min VC for sequence of exercise  10/31/23 (eval): Eccentric left shoulder flexion with yellow band 2 x 10     PATIENT EDUCATION: Education details: Form and technique and explanation of shoulder impingement and biceps tendinitis  Person educated: Patient Education method: Programmer, multimedia, Demonstration, Verbal cues, and Handouts Education comprehension: verbalized understanding, returned demonstration, and verbal cues required  HOME EXERCISE PROGRAM: Access Code: [email or web address] URL: [email or web address] Date: 12/04/2023 Prepared by: Marge Shed  Exercises - Seated Upper Trapezius Stretch  - 1 x daily - 7 x weekly - 3 reps - 60 sec hold - Standing Shoulder Flexion with Resistance with Dumbbell instead of Band   - 3-4 x weekly - 3 sets - 10 reps - Shoulder Abduction- Slide Up Wall, Step Away and Bring Weight Down Slow   -  3-4 x weekly - 3 sets - 10 reps - Standing Shoulder Horizontal Abduction with Dumbbells - Palms Down  - 3-4 x weekly - 3 sets - 10 reps - Low Trap Setting at Wall  - 3-4 x weekly - 3 sets - 10 reps   ASSESSMENT:  CLINICAL  IMPRESSION: Pt shows significant improvement in pain tolerance with left shoulder activity with ability to perform progressed eccentric shoulder flexion and abduction without an increase in his pain. HEP modified slightly to included progressed strengthening exercises with additional periscapular strengthener. He will continue benefit from skilled PT to address the aforementioned deficits to complete activities like driving and reaching for objects at work without pain and discomfort.    OBJECTIVE IMPAIRMENTS: decreased strength, impaired UE functional use, postural dysfunction, and pain.   ACTIVITY LIMITATIONS: carrying, lifting, and reach over head  PARTICIPATION LIMITATIONS: driving, occupation, and yard work  PERSONAL FACTORS: 3+ comorbidities: HTN, T2DM, and COPD are also affecting patient's functional outcome.   REHAB POTENTIAL: Good  CLINICAL DECISION MAKING: Stable/uncomplicated  EVALUATION COMPLEXITY: Low   GOALS: Goals reviewed with patient? No  SHORT TERM GOALS: Target date: 11/15/2023  Patient will demonstrate undestanding of home exercise plan by performing exercises correctly with evidence of good carry over with min to no verbal or tactile cues .   Baseline: NT 11/05/23: Able to perform exercises without difficulty.  Goal status: ACHIEVED      LONG TERM GOALS: Target date: 01/10/2024  Patient will improve left shoulder function as evidenced by an improvement in QuickDash score of >=10 pts to return to using LUE more reliably for driving and work related tasks. Baseline: 18 pt or 18% Goal status: ONGOING   2. Patient will exhibit symmetrical left shoulder and periscapular strength to right shoulder strength for improved UE function to perform driving and lifting tasks without pain or discomfort  Baseline: Mid Trap R/L 4/4, Lower Trap R/L 4-/4-, Shoulder Extension R/L 4/4  Goal status: ONGOING     PLAN:  PT FREQUENCY: 1-2x/week  PT DURATION: 10 weeks  PLANNED  INTERVENTIONS: 97164- PT Re-evaluation, 97110-Therapeutic exercises, 97530- Therapeutic activity, 97112- Neuromuscular re-education, 97535- Self Care, 16109- Manual therapy, [id number]- Aquatic Therapy, U0454- Electrical stimulation (unattended), [phone]- Electrical stimulation (manual), Patient/Family education, Dry Needling, Joint mobilization, Joint manipulation, Spinal manipulation, Spinal mobilization, DME instructions, Cryotherapy, Moist heat, and Biofeedback  PLAN FOR NEXT SESSION: Review lower trap setting and continue to work on periscapular strength using OMEGA machine. Continue to progress eccentric shoulder exercises.    Marge Shed PT, DPT  [facility] [address] [place], [address] Phone: [phone]   Fax:  [phone]

## 2023-12-10 ENCOUNTER — Ambulatory Visit: Admitting: Physical Therapy

## 2023-12-10 DIAGNOSIS — M25512 Pain in left shoulder: Secondary | ICD-10-CM

## 2023-12-10 NOTE — Therapy (Signed)
 OUTPATIENT PHYSICAL THERAPY SHOULDER TREATMENT    Patient Name: James Peck MRN: [id number] DOB:Jul 02, 1941, 83 y.o., male Today's Date: 12/10/2023  END OF SESSION:  PT End of Session - 12/10/23 1002     Visit Number 8    Number of Visits 20    Date for PT Re-Evaluation 01/09/24    Authorization Type [id number]    Authorization - Visit Number 8    Authorization - Number of Visits 20    Progress Note Due on Visit 10    PT Start Time 0950    PT Stop Time 1030    PT Time Calculation (min) 40 min    Activity Tolerance Patient tolerated treatment well    Behavior During Therapy WFL for tasks assessed/performed                Past Medical History:  Diagnosis Date   Allergy    Arthritis    Coronary artery disease    GERD (gastroesophageal reflux disease)    History of hiatal hernia    Hypercholesteremia    Hypertension    Past Surgical History:  Procedure Laterality Date   CARDIAC CATHETERIZATION     cardiac stents     CATARACT EXTRACTION W/PHACO Right 09/10/2017   Procedure: CATARACT EXTRACTION PHACO AND INTRAOCULAR LENS PLACEMENT (IOC);  Surgeon: Clair Crews, MD;  Location: [facility] ORS;  Service: Ophthalmology;  Laterality: Right;  US  01:01.7 AP% 15.3 CDE 9.38 Fluid pack lot # [id number]   CATARACT EXTRACTION W/PHACO Left 10/02/2017   Procedure: CATARACT EXTRACTION PHACO AND INTRAOCULAR LENS PLACEMENT (IOC);  Surgeon: Clair Crews, MD;  Location: [facility];  Service: Ophthalmology;  Laterality: Left;  US  00:42.3 AP% 16.2 CDE 6.85 Fluid Pack Lot # [id number]   COLONOSCOPY WITH PROPOFOL  N/A 06/28/2017   Procedure: COLONOSCOPY WITH PROPOFOL ;  Surgeon: Cassie Click, MD;  Location: [facility] ENDOSCOPY;  Service: Endoscopy;  Laterality: N/A;   COLONOSCOPY WITH PROPOFOL  N/A 12/12/2021   Procedure: COLONOSCOPY WITH PROPOFOL ;  Surgeon: Shane Darling, MD;  Location: [facility] ENDOSCOPY;  Service: Endoscopy;  Laterality: N/A;   CORONARY ANGIOPLASTY     STENTS X 2   EYE  SURGERY     KNEE ARTHROSCOPY     LEFT HEART CATH AND CORONARY ANGIOGRAPHY N/A 09/12/2020   Procedure: LEFT HEART CATH AND CORONARY ANGIOGRAPHY;  Surgeon: Percival Brace, MD;  Location: [facility];  Service: Cardiovascular;  Laterality: N/A;   Patient Active Problem List   Diagnosis Date Noted   Shoulder pain, left 10/23/2023   Memory change 06/21/2023   Diabetes mellitus type 2 with complications (HCC) 01/07/2023   BPH (benign prostatic hyperplasia) 11/15/2022   Asthma-COPD overlap syndrome (HCC) 01/31/2022   Bronchiectasis without complication (HCC) 01/31/2022   Hematuria 10/01/2021   History of colon polyps 08/06/2021   Hyponatremia 04/09/2021   Subclavian artery stenosis, left (HCC) 02/04/2021   Carotid artery disease (HCC) 02/04/2021   Coronary artery disease    Chest pain    NSTEMI (non-ST elevated myocardial infarction) (HCC)    COPD exacerbation (HCC)    Allergic rhinitis 04/12/2020   Emphysema, unspecified (HCC) 04/12/2020   Former smoker 04/12/2020   Lung nodules 03/27/2020   Renal lesion 03/27/2020   Abnormal abdominal CT scan 02/16/2020   Right rotator cuff tear 08/19/2019   Cough 06/30/2019   Healthcare maintenance 08/28/2018   Hyperglycemia 12/22/2017   Degenerative arthritis of left knee 06/04/2017   Degenerative tear of meniscus of left knee 02/14/2015   Acid  reflux 01/27/2015   Adenomatous colon polyp 01/27/2015   Allergy 01/27/2015   Arteriosclerosis of coronary artery 01/27/2015   Lumbar radiculopathy 01/27/2015   CAD in native artery 01/07/2014   Benign essential HTN 10/21/2013   HLD (hyperlipidemia) 10/21/2013    PCP: Dr. Dellar Fenton    REFERRING PROVIDER: Dr. Dellar Fenton    REFERRING DIAG: [phone] (ICD-10-CM) - Left shoulder pain, unspecified chronicity  THERAPY DIAG:  Acute pain of left shoulder  Rationale for Evaluation and Treatment: Rehabilitation  ONSET DATE: 09/30/23  SUBJECTIVE:                                                                                                                                                                                       SUBJECTIVE STATEMENT: Pt states that his shoulder continues to feel decreased pain despite him continuing to work on his garden.  Hand dominance: Right  PERTINENT HISTORY: Pt reports that he started experiencing left shoulder pain last month that developed insidiously. He is not sure what caused it and he experiences most of his pain when driving and resting his left arm. 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   PAIN:  Are you having pain? Yes: NPRS scale: 0/10  Pain location: Anterior surface of left glenohumeral joint  Pain description: Achy  Aggravating factors: Putting weight on it like resting on car door  Relieving factors: Blue rub  PRECAUTIONS: None  RED FLAGS: None   WEIGHT BEARING RESTRICTIONS: No  FALLS:  Has patient fallen in last 6 months? No  LIVING ENVIRONMENT: Lives with: lives with their spouse Lives in: House/apartment Stairs: Yes: Internal: 13 steps; on right going up, on left going up, and can reach both Has following equipment at home: None  OCCUPATION: Educational psychologist    PLOF: Independent  PATIENT GOALS: To feel less left shoulder pain  NEXT MD VISIT: Not sure   OBJECTIVE:  Note: Objective measures were completed at Evaluation unless otherwise noted.  VITALS BP 162/77  HR 60 SpO2 96%  DIAGNOSTIC FINDINGS:  None listed    PATIENT SURVEYS:  Quick Dash 18.2%  COGNITION: Overall cognitive status: Within functional limits for tasks assessed     SENSATION: WFL  POSTURE: Rounded shoulders    UPPER EXTREMITY ROM:   Active ROM Right eval Left eval  Shoulder flexion 180 180  Shoulder extension    Shoulder abduction 180 180*  Shoulder adduction    Shoulder internal rotation    Shoulder external rotation    Elbow flexion    Elbow extension    Wrist flexion     Wrist extension  Wrist ulnar deviation    Wrist radial deviation    Wrist pronation    Wrist supination    (Blank rows = not tested)   UPPER EXTREMITY MMT:  MMT Right eval Left eval  Shoulder flexion 4+ 4+  Shoulder extension 4 4  Shoulder abduction 4+ 4+  Shoulder adduction    Shoulder internal rotation 4+ 4+  Shoulder external rotation 4+ 4+*  Middle trapezius 4 4  Lower trapezius 4- 4-  Elbow flexion    Elbow extension    Wrist flexion    Wrist extension    Wrist ulnar deviation    Wrist radial deviation    Wrist pronation    Wrist supination    Grip strength (lbs)    (Blank rows = not tested)  SHOULDER SPECIAL TESTS: Impingement tests: Neer impingement test: positive , Hawkins/Kennedy impingement test: negative, and Painful arc test: positive  Rotator cuff assessment: Drop arm test: negative, Empty can test: positive , and Infraspinatus test: negative Biceps assessment: Speed's test: positive   JOINT MOBILITY TESTING:  NT  PALPATION:  Anterior portion of left shoulder TTP                                                                                                                              TREATMENT DATE:   12/10/23:  THEREX: All single UE exercises performed on LUE  UBE seat at 10 with resistance for 4 - 2.5 min forward and 2.5 backward  OMEGA Single Arm Rows #15 3 x 10  OMEGA Single Arm Lat Pull Downs #10 3 x 10 D2 PNF Flexion and Extension with yellow band  3 x 10  Banded Shoulder Horizontal Abduction with yellow band 3 x 10   Eccentric Shoulder Flexion with yellow band 3 x 10  Eccentric Shoulder Scaption with yellow band 3 x 10   12/04/23: THEREX: All single UE exercises performed on LUE  UBE seat at 10 with resistance for 4 - 2.5 min forward and 2.5 backward  OMEGA Single Arm Rows #10 1 x 10  OMEGA Single Arm Rows #13 2 x 10  OMEGA Single Arm Lat Pull downs #13 DB  1 X 10  Shoulder Horizontal Abduction AROM 1 x 10  Shoulder Horizontal  Abduction with #1 DB 2 x 10 -min VC to decrease shoulder elevation to avoid compensation  Eccentric shoulder flexion with AAROM for concentric phase  #2 DB 2 X 10  Eccentric shoulder flexion #2 DB with emphasis on slow for the eccentric phase 1 x 10  Eccentric shoulder abduction with AAROM for concentric phase  #2 DB 2 X 10  Eccentric shoulder abduction with #2 DB with emphasis on slow for the eccentric phase 1 x 10    11/26/23: THEREX: All single UE exercises performed on LUE  UBE seat at 10 with resistance for 4 - 2.5 min forward and 2.5 backward  OMEGA Single Arm Rows  #5 1 x 10  OMEGA Single Arm Rows  #10 2 x 10  OMEGA Single Arm Lat Pull Down #10 3 x 10  OMEGA Single Shoulder ER at 0 deg Abd #5  2 X 10  -Pt describes feeling shortness of breath and unable to perform third set due left shoulder fatigue  Seated Eccentric shoulder flexion on LUE #2 DB 3 x 10  Standing Eccentric shoulder abduction on LUE AROM 1 x 10   Standing Eccentric shoulder abduction on LUE #1 DB 1 x 10  -min VC to keep thumb pointed upwards   11/19/23: All single UE exercises performed on LUE   THEREX:  UBE seat at 10 with resistance for 4 - 2.5 min forward and 2.5 backward   OMEGA Seated Rows #35 1 x 10  OMEGA Seated Rows #40 2 x 10  OMEGA Lat Pull Down #25 3 x 10  Seated Shoulder Flexion Eccentric with #2 DB 1 x 10  -min VC to decrease the speed of the eccentric portion.  Standing Eccentric Shoulder Abduction AROM 1 x 10   Standing Eccentric Shoulder Abduction #1 DB 1 x 10  -Pt reports a slight increase in the anterior surface of his left shoulder.  Seated Eccentric Shoulder Abduction #1 DB 1 x 10  -min VC to increase external rotation of hand with thumbs up    11/12/23: All single UE exercses performed on LUE  THEREX:  UBE seat at 10 with resistance for 4 - 2.5 min forward and 2.5 backward   Wall Slides into low trap setting 3 x 10  -mod VC to avoid lumbar and thoracic extension.  Seated Thoracic  Extension with 3 sec hold 2 x 10  OMEGA Seated Rows #25 lbs 1 x 10  OMEGA Seated Rows #35 lbs 2 x 10  OMEGA Lat Pull Down #25 3 x 10  -min VC for sequence of exercise    PATIENT EDUCATION: Education details: Form and technique and explanation of shoulder impingement and biceps tendinitis  Person educated: Patient Education method: Programmer, multimedia, Demonstration, Verbal cues, and Handouts Education comprehension: verbalized understanding, returned demonstration, and verbal cues required  HOME EXERCISE PROGRAM: Access Code: [email or web address] URL: [email or web address] Date: 12/04/2023 Prepared by: Marge Shed  Exercises - Seated Upper Trapezius Stretch  - 1 x daily - 7 x weekly - 3 reps - 60 sec hold - Standing Shoulder Flexion with Resistance with Dumbbell instead of Band   - 3-4 x weekly - 3 sets - 10 reps - Shoulder Abduction- Slide Up Wall, Step Away and Bring Weight Down Slow   - 3-4 x weekly - 3 sets - 10 reps - Standing Shoulder Horizontal Abduction with Dumbbells - Palms Down  - 3-4 x weekly - 3 sets - 10 reps - Low Trap Setting at Wall  - 3-4 x weekly - 3 sets - 10 reps   ASSESSMENT:  CLINICAL IMPRESSION: Pt continues to progress towards goals with ability to perform RTC and periscapular strengthening exercises with increased resistance. He did not experience an increase in his left shoulder pain during any point in the session with exception of single arm lat pull downs. If pt continues to experience decreased left shoulder pain, then PT will likely discharge next session. He will continue benefit from skilled PT to address the aforementioned deficits to complete activities like driving and reaching for objects at work without pain and discomfort.    OBJECTIVE IMPAIRMENTS: decreased strength, impaired UE functional use, postural dysfunction, and pain.   ACTIVITY LIMITATIONS: carrying, lifting,  and reach over head  PARTICIPATION LIMITATIONS: driving, occupation, and  yard work  PERSONAL FACTORS: 3+ comorbidities: HTN, T2DM, and COPD are also affecting patient's functional outcome.   REHAB POTENTIAL: Good  CLINICAL DECISION MAKING: Stable/uncomplicated  EVALUATION COMPLEXITY: Low   GOALS: Goals reviewed with patient? No  SHORT TERM GOALS: Target date: 11/15/2023  Patient will demonstrate undestanding of home exercise plan by performing exercises correctly with evidence of good carry over with min to no verbal or tactile cues .   Baseline: NT 11/05/23: Able to perform exercises without difficulty.  Goal status: ACHIEVED      LONG TERM GOALS: Target date: 01/10/2024  Patient will improve left shoulder function as evidenced by an improvement in QuickDash score of >=10 pts to return to using LUE more reliably for driving and work related tasks. Baseline: 18 pt or 18% Goal status: ONGOING   2. Patient will exhibit symmetrical left shoulder and periscapular strength to right shoulder strength for improved UE function to perform driving and lifting tasks without pain or discomfort  Baseline: Mid Trap R/L 4/4, Lower Trap R/L 4-/4-, Shoulder Extension R/L 4/4  Goal status: ONGOING     PLAN:  PT FREQUENCY: 1-2x/week  PT DURATION: 10 weeks  PLANNED INTERVENTIONS: 97164- PT Re-evaluation, 97110-Therapeutic exercises, 97530- Therapeutic activity, 97112- Neuromuscular re-education, 97535- Self Care, 62130- Manual therapy, [id number]- Aquatic Therapy, Q6578- Electrical stimulation (unattended), [phone]- Electrical stimulation (manual), Patient/Family education, Dry Needling, Joint mobilization, Joint manipulation, Spinal manipulation, Spinal mobilization, DME instructions, Cryotherapy, Moist heat, and Biofeedback  PLAN FOR NEXT SESSION: Reassess goals and finalize HEP.   Marge Shed PT, DPT  [facility] [address] [place], [address] Phone: [phone]   Fax:  [phone]

## 2023-12-12 ENCOUNTER — Ambulatory Visit: Admitting: Physical Therapy

## 2023-12-17 ENCOUNTER — Encounter: Admitting: Physical Therapy

## 2023-12-17 ENCOUNTER — Encounter (INDEPENDENT_AMBULATORY_CARE_PROVIDER_SITE_OTHER): Payer: Self-pay

## 2023-12-19 ENCOUNTER — Ambulatory Visit: Admitting: Physical Therapy

## 2023-12-19 DIAGNOSIS — M25512 Pain in left shoulder: Secondary | ICD-10-CM

## 2023-12-19 NOTE — Therapy (Addendum)
 OUTPATIENT PHYSICAL THERAPY SHOULDER DISCHARGE    Patient Name: James Peck MRN: [id number] DOB:1941/03/03, 83 y.o., male Today's Date: 12/19/2023  END OF SESSION:  PT End of Session - 12/19/23 0849     Visit Number 9    Number of Visits 20    Date for PT Re-Evaluation 01/09/24    Authorization Type HTA [id number]    Authorization - Visit Number 9    Authorization - Number of Visits 20    Progress Note Due on Visit 10    PT Start Time 0815    PT Stop Time 0845    PT Time Calculation (min) 30 min    Activity Tolerance Patient tolerated treatment well    Behavior During Therapy WFL for tasks assessed/performed                 Past Medical History:  Diagnosis Date   Allergy    Arthritis    Coronary artery disease    GERD (gastroesophageal reflux disease)    History of hiatal hernia    Hypercholesteremia    Hypertension    Past Surgical History:  Procedure Laterality Date   CARDIAC CATHETERIZATION     cardiac stents     CATARACT EXTRACTION W/PHACO Right 09/10/2017   Procedure: CATARACT EXTRACTION PHACO AND INTRAOCULAR LENS PLACEMENT (IOC);  Surgeon: Clair Crews, MD;  Location: [facility] ORS;  Service: Ophthalmology;  Laterality: Right;  US  01:01.7 AP% 15.3 CDE 9.38 Fluid pack lot # [id number]   CATARACT EXTRACTION W/PHACO Left 10/02/2017   Procedure: CATARACT EXTRACTION PHACO AND INTRAOCULAR LENS PLACEMENT (IOC);  Surgeon: Clair Crews, MD;  Location: [facility] ORS;  Service: Ophthalmology;  Laterality: Left;  US  00:42.3 AP% 16.2 CDE 6.85 Fluid Pack Lot # [id number]   COLONOSCOPY WITH PROPOFOL  N/A 06/28/2017   Procedure: COLONOSCOPY WITH PROPOFOL ;  Surgeon: Cassie Click, MD;  Location: [facility] ENDOSCOPY;  Service: Endoscopy;  Laterality: N/A;   COLONOSCOPY WITH PROPOFOL  N/A 12/12/2021   Procedure: COLONOSCOPY WITH PROPOFOL ;  Surgeon: Shane Darling, MD;  Location: [facility] ENDOSCOPY;  Service: Endoscopy;  Laterality: N/A;   CORONARY ANGIOPLASTY     STENTS X 2    EYE SURGERY     KNEE ARTHROSCOPY     LEFT HEART CATH AND CORONARY ANGIOGRAPHY N/A 09/12/2020   Procedure: LEFT HEART CATH AND CORONARY ANGIOGRAPHY;  Surgeon: Percival Brace, MD;  Location: [facility] INVASIVE CV LAB;  Service: Cardiovascular;  Laterality: N/A;   Patient Active Problem List   Diagnosis Date Noted   Shoulder pain, left 10/23/2023   Memory change 06/21/2023   Diabetes mellitus type 2 with complications (HCC) 01/07/2023   BPH (benign prostatic hyperplasia) 11/15/2022   Asthma-COPD overlap syndrome (HCC) 01/31/2022   Bronchiectasis without complication (HCC) 01/31/2022   Hematuria 10/01/2021   History of colon polyps 08/06/2021   Hyponatremia 04/09/2021   Subclavian artery stenosis, left (HCC) 02/04/2021   Carotid artery disease (HCC) 02/04/2021   Coronary artery disease    Chest pain    NSTEMI (non-ST elevated myocardial infarction) (HCC)    COPD exacerbation (HCC)    Allergic rhinitis 04/12/2020   Emphysema, unspecified (HCC) 04/12/2020   Former smoker 04/12/2020   Lung nodules 03/27/2020   Renal lesion 03/27/2020   Abnormal abdominal CT scan 02/16/2020   Right rotator cuff tear 08/19/2019   Cough 06/30/2019   Healthcare maintenance 08/28/2018   Hyperglycemia 12/22/2017   Degenerative arthritis of left knee 06/04/2017   Degenerative tear of meniscus of left knee 02/14/2015  Acid reflux 01/27/2015   Adenomatous colon polyp 01/27/2015   Allergy 01/27/2015   Arteriosclerosis of coronary artery 01/27/2015   Lumbar radiculopathy 01/27/2015   CAD in native artery 01/07/2014   Benign essential HTN 10/21/2013   HLD (hyperlipidemia) 10/21/2013    PCP: Dr. Dellar Fenton    REFERRING PROVIDER: Dr. Dellar Fenton    REFERRING DIAG: [phone] (ICD-10-CM) - Left shoulder pain, unspecified chronicity  THERAPY DIAG:  Acute pain of left shoulder  Rationale for Evaluation and Treatment: Rehabilitation  ONSET DATE: 09/30/23  SUBJECTIVE:                                                                                                                                                                                       SUBJECTIVE STATEMENT: Pt has now felt decreased pain in right shoulder for the past several weeks even with increased activity.  Hand dominance: Right  PERTINENT HISTORY: Pt reports that he started experiencing left shoulder pain last month that developed insidiously. He is not sure what caused it and he experiences most of his pain when driving and resting his left arm. 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   PAIN:  Are you having pain? Yes: NPRS scale: 0/10  Pain location: Anterior surface of left glenohumeral joint  Pain description: Achy  Aggravating factors: Putting weight on it like resting on car door  Relieving factors: Blue rub  PRECAUTIONS: None  RED FLAGS: None   WEIGHT BEARING RESTRICTIONS: No  FALLS:  Has patient fallen in last 6 months? No  LIVING ENVIRONMENT: Lives with: lives with their spouse Lives in: House/apartment Stairs: Yes: Internal: 13 steps; on right going up, on left going up, and can reach both Has following equipment at home: None  OCCUPATION: Educational psychologist    PLOF: Independent  PATIENT GOALS: To feel less left shoulder pain  NEXT MD VISIT: Not sure   OBJECTIVE:  Note: Objective measures were completed at Evaluation unless otherwise noted.  VITALS BP 162/77  HR 60 SpO2 96%  DIAGNOSTIC FINDINGS:  None listed    PATIENT SURVEYS:  Quick Dash 18.2%  COGNITION: Overall cognitive status: Within functional limits for tasks assessed     SENSATION: WFL  POSTURE: Rounded shoulders    UPPER EXTREMITY ROM:   Active ROM Right eval Left eval  Shoulder flexion 180 180  Shoulder extension    Shoulder abduction 180 180*  Shoulder adduction    Shoulder internal rotation    Shoulder external rotation    Elbow flexion    Elbow extension    Wrist  flexion    Wrist  extension    Wrist ulnar deviation    Wrist radial deviation    Wrist pronation    Wrist supination    (Blank rows = not tested)   UPPER EXTREMITY MMT:  MMT Right eval Left eval  Shoulder flexion 4+ 4+  Shoulder extension 4 4  Shoulder abduction 4+ 4+  Shoulder adduction    Shoulder internal rotation 4+ 4+  Shoulder external rotation 4+ 4+*  Middle trapezius 4 4  Lower trapezius 4- 4-  Elbow flexion    Elbow extension    Wrist flexion    Wrist extension    Wrist ulnar deviation    Wrist radial deviation    Wrist pronation    Wrist supination    Grip strength (lbs)    (Blank rows = not tested)  SHOULDER SPECIAL TESTS: Impingement tests: Neer impingement test: positive , Hawkins/Kennedy impingement test: negative, and Painful arc test: positive  Rotator cuff assessment: Drop arm test: negative, Empty can test: positive , and Infraspinatus test: negative Biceps assessment: Speed's test: positive   JOINT MOBILITY TESTING:  NT  PALPATION:  Anterior portion of left shoulder TTP                                                                                                                              TREATMENT DATE:   12/19/23: THEREX: All single UE exercises performed on LUE  UBE seat at 10 with resistance for 4 - 2.5 min forward and 2.5 backward  MMT  Mid Trap R 4+, Lower Trap R 4+, Shoulder Ext R 4+, Shoulder Flex R 4+, Shoulder Abd R 4, Shoulder R IR/ER at 0 deg Abd 4+ Reviewed home exercise plan with progressions  -Shoulder Flex R with red band 1 x 10  -Shoulder Abd R with red band 1 x 10   -PNF D2 Flexion and Extension R with red band 1 x 10     12/10/23:  THEREX: All single UE exercises performed on LUE  UBE seat at 10 with resistance for 4 - 2.5 min forward and 2.5 backward  OMEGA Single Arm Rows #15 3 x 10  OMEGA Single Arm Lat Pull Downs #10 3 x 10 D2 PNF Flexion and Extension with yellow band  3 x 10  Banded Shoulder Horizontal  Abduction with yellow band 3 x 10   Eccentric Shoulder Flexion with yellow band 3 x 10  Eccentric Shoulder Scaption with yellow band 3 x 10   12/04/23: THEREX: All single UE exercises performed on LUE  UBE seat at 10 with resistance for 4 - 2.5 min forward and 2.5 backward  OMEGA Single Arm Rows #10 1 x 10  OMEGA Single Arm Rows #13 2 x 10  OMEGA Single Arm Lat Pull downs #13 DB  1 X 10  Shoulder Horizontal Abduction AROM 1 x 10  Shoulder Horizontal Abduction with #1 DB 2 x 10 -min VC to decrease shoulder elevation to avoid  compensation  Eccentric shoulder flexion with AAROM for concentric phase  #2 DB 2 X 10  Eccentric shoulder flexion #2 DB with emphasis on slow for the eccentric phase 1 x 10  Eccentric shoulder abduction with AAROM for concentric phase  #2 DB 2 X 10  Eccentric shoulder abduction with #2 DB with emphasis on slow for the eccentric phase 1 x 10     PATIENT EDUCATION: Education details: Form and technique and explanation of shoulder impingement and biceps tendinitis  Person educated: Patient Education method: Programmer, multimedia, Demonstration, Verbal cues, and Handouts Education comprehension: verbalized understanding, returned demonstration, and verbal cues required  HOME EXERCISE PROGRAM: Access Code: [email or web address] URL: [email or web address] Date: 12/19/2023 Prepared by: Marge Shed  Exercises - Seated Upper Trapezius Stretch  - 1 x daily - 7 x weekly - 3 reps - 60 sec hold - Doorway Pec Stretch at 60 Elevation  - 1 x daily - 7 x weekly - 3 reps - 60 sec hold - Single Arm Scaption with Resistance  - 3-4 x weekly - 3 sets - 10 reps - Standing Shoulder Flexion with Resistance with Dumbbell instead of Band   - 3-4 x weekly - 3 sets - 10 reps - Standing Shoulder Single Arm PNF D2 Flexion with Resistance  - 3-4 x weekly - 3 sets - 10 reps - Standing Shoulder Horizontal Abduction with Resistance  - 3-4 x weekly - 3 sets - 10 reps   ASSESSMENT:  CLINICAL  IMPRESSION: Pt continues to progress towards goals with ability to perform RTC and periscapular strengthening exercises with increased resistance. He did not experience an increase in his left shoulder pain during any point in the session with exception of single arm lat pull downs. If pt continues to experience decreased left shoulder pain, then PT will likely discharge next session. He will continue benefit from skilled PT to address the aforementioned deficits to complete activities like driving and reaching for objects at work without pain and discomfort.    OBJECTIVE IMPAIRMENTS: decreased strength, impaired UE functional use, postural dysfunction, and pain.   ACTIVITY LIMITATIONS: carrying, lifting, and reach over head  PARTICIPATION LIMITATIONS: driving, occupation, and yard work  PERSONAL FACTORS: 3+ comorbidities: HTN, T2DM, and COPD are also affecting patient's functional outcome.   REHAB POTENTIAL: Good  CLINICAL DECISION MAKING: Stable/uncomplicated  EVALUATION COMPLEXITY: Low   GOALS: Goals reviewed with patient? No  SHORT TERM GOALS: Target date: 11/15/2023  Patient will demonstrate undestanding of home exercise plan by performing exercises correctly with evidence of good carry over with min to no verbal or tactile cues .   Baseline: NT 11/05/23: Able to perform exercises without difficulty.  Goal status: ACHIEVED      LONG TERM GOALS: Target date: 01/10/2024  Patient will improve left shoulder function as evidenced by an improvement in QuickDash score of >=10 pts to return to using LUE more reliably for driving and work related tasks. Baseline: 18 pt or 18% Goal status: Deferred   2. Patient will exhibit symmetrical left shoulder and periscapular strength to right shoulder strength for improved UE function to perform driving and lifting tasks without pain or discomfort  Baseline: Mid Trap R/L 4/4, Lower Trap R/L 4-/4-, Shoulder Extension R/L 4/4 12/19/23: Mid Trap R  4+, Lower Trap R 4+, Shoulder Ext R 4+, Shoulder Flex R 4+, Shoulder Abd R 4, Shoulder R IR/ER at 0 deg Abd 4+ Goal status: ACHIEVED     PLAN:  PT FREQUENCY: 1-2x/week  PT DURATION: 10  weeks  PLANNED INTERVENTIONS: 97164- PT Re-evaluation, 97110-Therapeutic exercises, 97530- Therapeutic activity, 97112- Neuromuscular re-education, 97535- Self Care, 57846- Manual therapy, [phone]- Aquatic Therapy, [phone]- Electrical stimulation (unattended), [phone]- Electrical stimulation (manual), Patient/Family education, Dry Needling, Joint mobilization, Joint manipulation, Spinal manipulation, Spinal mobilization, DME instructions, Cryotherapy, Moist heat, and Biofeedback  PLAN FOR NEXT SESSION: Discharge from Physical Therapy    Marge Shed PT, DPT  [facility] [address] [place], [address] Phone: [phone]   Fax:  [phone]

## 2023-12-24 ENCOUNTER — Ambulatory Visit: Admitting: Physical Therapy

## 2023-12-25 ENCOUNTER — Encounter: Admitting: Physical Therapy

## 2023-12-26 ENCOUNTER — Ambulatory Visit: Admitting: Physical Therapy

## 2023-12-30 ENCOUNTER — Ambulatory Visit (INDEPENDENT_AMBULATORY_CARE_PROVIDER_SITE_OTHER)

## 2023-12-30 DIAGNOSIS — E538 Deficiency of other specified B group vitamins: Secondary | ICD-10-CM | POA: Diagnosis not present

## 2023-12-30 MED ORDER — CYANOCOBALAMIN 1000 MCG/ML IJ SOLN
1000.0000 ug | Freq: Once | INTRAMUSCULAR | Status: AC
  Administered 2023-12-30: 1000 ug via INTRAMUSCULAR

## 2023-12-30 NOTE — Progress Notes (Signed)
 Pt presented for their vitamin B12 injection. Pt was identified through two identifiers. Pt tolerated shot well in their left deltoid.

## 2024-01-06 ENCOUNTER — Other Ambulatory Visit: Payer: Self-pay

## 2024-01-06 MED ORDER — EZETIMIBE 10 MG PO TABS: 10.0000 mg | ORAL_TABLET | Freq: Every day | ORAL | 3 refills | Status: AC

## 2024-01-06 MED ORDER — ROSUVASTATIN CALCIUM 20 MG PO TABS: 20.0000 mg | ORAL_TABLET | Freq: Every day | ORAL | 3 refills | Status: AC

## 2024-01-08 ENCOUNTER — Other Ambulatory Visit: Payer: Self-pay | Admitting: Internal Medicine

## 2024-01-13 ENCOUNTER — Other Ambulatory Visit: Payer: Self-pay | Admitting: Cardiovascular Disease

## 2024-01-20 ENCOUNTER — Other Ambulatory Visit (HOSPITAL_COMMUNITY): Payer: Self-pay

## 2024-01-20 ENCOUNTER — Other Ambulatory Visit (HOSPITAL_BASED_OUTPATIENT_CLINIC_OR_DEPARTMENT_OTHER): Payer: Self-pay

## 2024-01-20 MED FILL — Fluticasone-Umeclidinium-Vilanterol AEPB 100-62.5-25 MCG/ACT: RESPIRATORY_TRACT | 90 days supply | Qty: 180 | Fill #2 | Status: CN

## 2024-01-20 MED FILL — Fluticasone-Umeclidinium-Vilanterol AEPB 100-62.5-25 MCG/ACT: RESPIRATORY_TRACT | 90 days supply | Qty: 180 | Fill #2 | Status: AC

## 2024-01-21 ENCOUNTER — Encounter: Payer: Self-pay | Admitting: Pharmacist

## 2024-01-21 ENCOUNTER — Other Ambulatory Visit: Payer: Self-pay

## 2024-01-23 ENCOUNTER — Other Ambulatory Visit (INDEPENDENT_AMBULATORY_CARE_PROVIDER_SITE_OTHER): Payer: Self-pay | Admitting: Vascular Surgery

## 2024-01-23 ENCOUNTER — Other Ambulatory Visit (HOSPITAL_COMMUNITY): Payer: Self-pay

## 2024-01-23 DIAGNOSIS — I6523 Occlusion and stenosis of bilateral carotid arteries: Secondary | ICD-10-CM

## 2024-01-24 ENCOUNTER — Encounter (INDEPENDENT_AMBULATORY_CARE_PROVIDER_SITE_OTHER): Payer: Self-pay | Admitting: Nurse Practitioner

## 2024-01-24 ENCOUNTER — Ambulatory Visit (INDEPENDENT_AMBULATORY_CARE_PROVIDER_SITE_OTHER): Payer: PPO

## 2024-01-24 ENCOUNTER — Ambulatory Visit (INDEPENDENT_AMBULATORY_CARE_PROVIDER_SITE_OTHER): Payer: PPO | Admitting: Nurse Practitioner

## 2024-01-24 VITALS — BP 127/72 | HR 67 | Ht 67.0 in | Wt 166.0 lb

## 2024-01-24 DIAGNOSIS — E785 Hyperlipidemia, unspecified: Secondary | ICD-10-CM

## 2024-01-24 DIAGNOSIS — I6523 Occlusion and stenosis of bilateral carotid arteries: Secondary | ICD-10-CM

## 2024-01-24 DIAGNOSIS — I10 Essential (primary) hypertension: Secondary | ICD-10-CM | POA: Diagnosis not present

## 2024-01-27 ENCOUNTER — Encounter (INDEPENDENT_AMBULATORY_CARE_PROVIDER_SITE_OTHER): Payer: Self-pay | Admitting: Nurse Practitioner

## 2024-01-27 NOTE — Progress Notes (Signed)
 Subjective:    Patient ID: James Peck, male    DOB: 1941-01-15, 83 y.o.   MRN: [id number] No chief complaint on file.   The patient is seen for follow up evaluation of carotid stenosis. The carotid stenosis followed by ultrasound.   The patient denies amaurosis fugax. There is no recent history of TIA symptoms or focal motor deficits. There is no prior documented CVA.  The patient is taking enteric-coated aspirin  81 mg daily.  There is no history of migraine headaches. There is no history of seizures.  No recent shortening of the patient's walking distance or new symptoms consistent with claudication.  No history of rest pain symptoms. No new ulcers or wounds of the lower extremities have occurred.  There is no history of DVT, PE or superficial thrombophlebitis. No documented recent episodes of angina or shortness of breath documented.   Carotid Duplex done today shows <40%.  No change compared to last study in 2023  Vertebral arteries were well-visualized today with no evidence of stenosis and normal flow hemodynamics in the bilateral subclavian arteries.    Review of Systems  Neurological:  Negative for dizziness.  All other systems reviewed and are negative.      Objective:   Physical Exam Vitals reviewed.  HENT:     Head: Normocephalic.  Neck:     Vascular: No carotid bruit.   Cardiovascular:     Rate and Rhythm: Normal rate.     Pulses: Normal pulses.  Pulmonary:     Effort: Pulmonary effort is normal.   Skin:    General: Skin is warm and dry.   Neurological:     Mental Status: He is alert and oriented to person, place, and time.   Psychiatric:        Mood and Affect: Mood normal.        Behavior: Behavior normal.        Thought Content: Thought content normal.        Judgment: Judgment normal.     BP 127/72   Pulse 67   Ht 5' 7 (1.702 m)   Wt 166 lb (75.3 kg)   BMI 26.00 kg/m   Past Medical History:  Diagnosis Date   Allergy    Arthritis     Coronary artery disease    GERD (gastroesophageal reflux disease)    History of hiatal hernia    Hypercholesteremia    Hypertension     Social History   Socioeconomic History   Marital status: Married    Spouse name: Not on file   Number of children: Not on file   Years of education: Not on file   Highest education level: 12th grade  Occupational History   Not on file  Tobacco Use   Smoking status: Former    Current packs/day: 0.00    Average packs/day: 1 pack/day for 40.0 years (40.0 ttl pk-yrs)    Types: Cigarettes    Start date: 27    Quit date: 2000    Years since quitting: 25.5   Smokeless tobacco: Never  Vaping Use   Vaping status: Never Used  Substance and Sexual Activity   Alcohol use: No   Drug use: No   Sexual activity: Not on file  Other Topics Concern   Not on file  Social History Narrative   Married   Social Drivers of Health   Financial Resource Strain: Low Risk  (10/21/2023)   Overall Financial Resource Strain (CARDIA)  Difficulty of Paying Living Expenses: Not hard at all  Food Insecurity: No Food Insecurity (10/21/2023)   Hunger Vital Sign    Worried About Running Out of Food in the Last Year: Never true    Ran Out of Food in the Last Year: Never true  Transportation Needs: No Transportation Needs (10/21/2023)   PRAPARE - Administrator, Civil Service (Medical): No    Lack of Transportation (Non-Medical): No  Physical Activity: Insufficiently Active (10/21/2023)   Exercise Vital Sign    Days of Exercise per Week: 3 days    Minutes of Exercise per Session: 30 min  Stress: No Stress Concern Present (10/21/2023)   [employer] of Occupational Health - Occupational Stress Questionnaire    Feeling of Stress : Not at all  Social Connections: Socially Integrated (10/21/2023)   Social Connection and Isolation Panel    Frequency of Communication with Friends and Family: More than three times a week    Frequency of Social Gatherings  with Friends and Family: More than three times a week    Attends Religious Services: More than 4 times per year    Active Member of [employer] or Organizations: Yes    Attends Banker Meetings: More than 4 times per year    Marital Status: Married  Catering manager Violence: Not At Risk (07/26/2023)   Humiliation, Afraid, Rape, and Kick questionnaire    Fear of Current or Ex-Partner: No    Emotionally Abused: No    Physically Abused: No    Sexually Abused: No    Past Surgical History:  Procedure Laterality Date   CARDIAC CATHETERIZATION     cardiac stents     CATARACT EXTRACTION W/PHACO Right 09/10/2017   Procedure: CATARACT EXTRACTION PHACO AND INTRAOCULAR LENS PLACEMENT (IOC);  Surgeon: Jaye Fallow, MD;  Location: [facility] ORS;  Service: Ophthalmology;  Laterality: Right;  US  01:01.7 AP% 15.3 CDE 9.38 Fluid pack lot # [id number]   CATARACT EXTRACTION W/PHACO Left 10/02/2017   Procedure: CATARACT EXTRACTION PHACO AND INTRAOCULAR LENS PLACEMENT (IOC);  Surgeon: Jaye Fallow, MD;  Location: [facility] ORS;  Service: Ophthalmology;  Laterality: Left;  US  00:42.3 AP% 16.2 CDE 6.85 Fluid Pack Lot # [id number]   COLONOSCOPY WITH PROPOFOL  N/A 06/28/2017   Procedure: COLONOSCOPY WITH PROPOFOL ;  Surgeon: Viktoria Lamar DASEN, MD;  Location: [facility] ENDOSCOPY;  Service: Endoscopy;  Laterality: N/A;   COLONOSCOPY WITH PROPOFOL  N/A 12/12/2021   Procedure: COLONOSCOPY WITH PROPOFOL ;  Surgeon: Maryruth Ole DASEN, MD;  Location: [facility] ENDOSCOPY;  Service: Endoscopy;  Laterality: N/A;   CORONARY ANGIOPLASTY     STENTS X 2   EYE SURGERY     KNEE ARTHROSCOPY     LEFT HEART CATH AND CORONARY ANGIOGRAPHY N/A 09/12/2020   Procedure: LEFT HEART CATH AND CORONARY ANGIOGRAPHY;  Surgeon: Ammon Blunt, MD;  Location: [facility] INVASIVE CV LAB;  Service: Cardiovascular;  Laterality: N/A;    Family History  Problem Relation Age of Onset   Heart disease Mother    Diabetes Mother    Heart disease  Father    Diabetes Father    Alzheimer's disease Brother    Stroke Brother     Allergies  Allergen Reactions   Ace Inhibitors Cough   Enalapril Cough       Latest Ref Rng & Units 06/21/2023    9:27 AM 08/09/2022    7:48 AM 05/22/2022    7:45 AM  CBC  WBC 4.0 - 10.5 K/uL 7.4  6.0  7.2  Hemoglobin 13.0 - 17.0 g/dL 84.6  85.5  84.8   Hematocrit 39.0 - 52.0 % 46.2  42.7  44.6   Platelets 150.0 - 400.0 K/uL 300.0  280.0  306.0       CMP     Component Value Date/Time   NA 136 10/18/2023 0733   NA 137 11/06/2012 0034   K 4.4 10/18/2023 0733   K 3.8 11/06/2012 0034   CL 102 10/18/2023 0733   CL 104 11/06/2012 0034   CO2 26 10/18/2023 0733   CO2 26 11/06/2012 0034   GLUCOSE 121 (H) 10/18/2023 0733   GLUCOSE 127 (H) 11/06/2012 0034   BUN 13 10/18/2023 0733   BUN 12 11/06/2012 0034   CREATININE 0.97 10/18/2023 0733   CREATININE 0.96 11/06/2012 0034   CALCIUM  9.7 10/18/2023 0733   CALCIUM  8.3 (L) 11/06/2012 0034   PROT 6.9 10/18/2023 0733   ALBUMIN 4.5 10/18/2023 0733   AST 25 10/18/2023 0733   ALT 27 10/18/2023 0733   ALKPHOS 53 10/18/2023 0733   BILITOT 0.8 10/18/2023 0733   GFR 72.91 10/18/2023 0733   GFRNONAA >60 09/12/2020 2355   GFRNONAA >60 11/06/2012 0034     No results found.     Assessment & Plan:   1. Bilateral carotid artery stenosis Recommend:  Given the patient's asymptomatic subcritical stenosis no further invasive testing or surgery at this time.  Duplex ultrasound shows <40% stenosis bilaterally.  Continue antiplatelet therapy as prescribed Continue management of CAD, HTN and Hyperlipidemia Healthy heart diet,  encouraged exercise at least 4 times per week Follow up in 24 months with duplex ultrasound and physical exam   2. Benign essential HTN Continue antihypertensive medications as already ordered, these medications have been reviewed and there are no changes at this time.  3. Hyperlipidemia, unspecified hyperlipidemia  type Continue statin as ordered and reviewed, no changes at this time  4. Subclavian artery disease (HCC) Normal flow hemodynamics in the bilateral subclavian arteries with relatively normal blood pressures bilaterally.  No role for intervention currently.   Current Outpatient Medications on File Prior to Visit  Medication Sig Dispense Refill   albuterol  (VENTOLIN  HFA) 108 (90 Base) MCG/ACT inhaler Inhale 1 puff into the lungs every 6 (six) hours as needed for wheezing or shortness of breath. 18 g 3   aspirin  EC 81 MG tablet Take 81 mg by mouth daily.      calcium  carbonate (OS-CAL - DOSED IN MG OF ELEMENTAL CALCIUM ) 1250 (500 CA) MG tablet Take 1 tablet by mouth daily.      cetirizine  (ZYRTEC ) 10 MG tablet TAKE 1 TABLET BY MOUTH DAILY 90 tablet 1   CINNAMON  PO Take 1,200 mg by mouth 2 times daily at 12 noon and 4 pm.     ezetimibe  (ZETIA ) 10 MG tablet Take 1 tablet (10 mg total) by mouth daily. 90 tablet 3   famotidine  (PEPCID ) 20 MG tablet Take 1 tablet (20 mg total) by mouth daily. 90 tablet 3   finasteride  (PROSCAR ) 5 MG tablet TAKE ONE TABLET EVERY DAY 90 tablet 3   fluticasone  (FLONASE ) 50 MCG/ACT nasal spray USE 2 SPRAYS IN EACH NOSTRIL ONCE DAILY AS DIRECTED 48 g 3   Fluticasone -Umeclidin-Vilant (TRELEGY ELLIPTA ) 100-62.5-25 MCG/ACT AEPB INHALE ONE PUFF INTO THE LUNGS EVERY DAY 180 each 3   GLUCOSAMINE-CHONDROITIN PO Take 1 capsule by mouth daily.     Misc Natural Products (BLACK CHERRY CONCENTRATE PO) Take 1,000 mg daily by mouth.     nitroGLYCERIN  (  NITROSTAT ) 0.4 MG SL tablet Place 1 tablet (0.4 mg total) under the tongue every 5 (five) minutes as needed for chest pain. 25 tablet 1   Omega-3 Fatty Acids (FISH OIL) 1000 MG CAPS Take 1,000 mg by mouth daily.      rosuvastatin  (CRESTOR ) 20 MG tablet Take 1 tablet (20 mg total) by mouth daily. 90 tablet 3   sildenafil  (REVATIO ) 20 MG tablet TAKE 1 TO 2 TABLETS BY MOUTH EVERY DAY AS NEEDED 60 tablet 1   telmisartan  (MICARDIS ) 40 MG  tablet TAKE 1 TABLET BY MOUTH ONCE DAILY 90 tablet 3   No current facility-administered medications on file prior to visit.    There are no Patient Instructions on file for this visit. No follow-ups on file.   Neima Lacross E Zollie Clemence, NP

## 2024-01-29 ENCOUNTER — Ambulatory Visit

## 2024-01-29 DIAGNOSIS — E538 Deficiency of other specified B group vitamins: Secondary | ICD-10-CM | POA: Diagnosis not present

## 2024-01-29 MED ORDER — CYANOCOBALAMIN 1000 MCG/ML IJ SOLN
1000.0000 ug | Freq: Once | INTRAMUSCULAR | Status: AC
  Administered 2024-01-29: 1000 ug via INTRAMUSCULAR

## 2024-01-29 NOTE — Progress Notes (Signed)
Pt received B12 injection in right deltoid. Pt tolerated it well with no complaints or concerns. 

## 2024-02-20 ENCOUNTER — Other Ambulatory Visit

## 2024-02-21 ENCOUNTER — Ambulatory Visit: Payer: Self-pay | Admitting: Internal Medicine

## 2024-02-21 ENCOUNTER — Other Ambulatory Visit (INDEPENDENT_AMBULATORY_CARE_PROVIDER_SITE_OTHER)

## 2024-02-21 DIAGNOSIS — E785 Hyperlipidemia, unspecified: Secondary | ICD-10-CM | POA: Diagnosis not present

## 2024-02-21 DIAGNOSIS — E118 Type 2 diabetes mellitus with unspecified complications: Secondary | ICD-10-CM

## 2024-02-21 DIAGNOSIS — I10 Essential (primary) hypertension: Secondary | ICD-10-CM

## 2024-02-21 LAB — LIPID PANEL
Cholesterol: 113 mg/dL (ref 0–200)
HDL: 41.2 mg/dL (ref >39.00)
LDL Cholesterol: 51 mg/dL (ref 0–99)
NonHDL: 71.61
Total CHOL/HDL Ratio: 3
Triglycerides: 101 mg/dL (ref 0.0–149.0)
VLDL: 20.2 mg/dL (ref 0.0–40.0)

## 2024-02-21 LAB — HEPATIC FUNCTION PANEL
ALT: 23 U/L (ref 0–53)
AST: 21 U/L (ref 0–37)
Albumin: 4.5 g/dL (ref 3.5–5.2)
Alkaline Phosphatase: 52 U/L (ref 39–117)
Bilirubin, Direct: 0.1 mg/dL (ref 0.0–0.3)
Total Bilirubin: 0.8 mg/dL (ref 0.2–1.2)
Total Protein: 6.9 g/dL (ref 6.0–8.3)

## 2024-02-21 LAB — BASIC METABOLIC PANEL WITH GFR
BUN: 15 mg/dL (ref 6–23)
CO2: 25 meq/L (ref 19–32)
Calcium: 9.2 mg/dL (ref 8.4–10.5)
Chloride: 102 meq/L (ref 96–112)
Creatinine, Ser: 1.01 mg/dL (ref 0.40–1.50)
GFR: 69.29 mL/min (ref >60.00)
Glucose, Bld: 107 mg/dL — ABNORMAL HIGH (ref 70–99)
Potassium: 4.3 meq/L (ref 3.5–5.1)
Sodium: 134 meq/L — ABNORMAL LOW (ref 135–145)

## 2024-02-21 LAB — HEMOGLOBIN A1C: Hgb A1c MFr Bld: 6.6 % — ABNORMAL HIGH (ref 4.6–6.5)

## 2024-02-21 LAB — TSH: TSH: 3.05 u[IU]/mL (ref 0.35–5.50)

## 2024-02-25 ENCOUNTER — Ambulatory Visit: Payer: Self-pay | Admitting: Internal Medicine

## 2024-02-25 ENCOUNTER — Encounter: Payer: Self-pay | Admitting: Internal Medicine

## 2024-02-25 ENCOUNTER — Ambulatory Visit: Admitting: Internal Medicine

## 2024-02-25 VITALS — BP 128/70 | HR 77 | Resp 16 | Ht 67.0 in | Wt 165.4 lb

## 2024-02-25 DIAGNOSIS — I25119 Atherosclerotic heart disease of native coronary artery with unspecified angina pectoris: Secondary | ICD-10-CM | POA: Diagnosis not present

## 2024-02-25 DIAGNOSIS — E785 Hyperlipidemia, unspecified: Secondary | ICD-10-CM

## 2024-02-25 DIAGNOSIS — R079 Chest pain, unspecified: Secondary | ICD-10-CM | POA: Diagnosis not present

## 2024-02-25 DIAGNOSIS — Z125 Encounter for screening for malignant neoplasm of prostate: Secondary | ICD-10-CM

## 2024-02-25 DIAGNOSIS — K219 Gastro-esophageal reflux disease without esophagitis: Secondary | ICD-10-CM | POA: Diagnosis not present

## 2024-02-25 DIAGNOSIS — I771 Stricture of artery: Secondary | ICD-10-CM

## 2024-02-25 DIAGNOSIS — J439 Emphysema, unspecified: Secondary | ICD-10-CM

## 2024-02-25 DIAGNOSIS — I779 Disorder of arteries and arterioles, unspecified: Secondary | ICD-10-CM

## 2024-02-25 DIAGNOSIS — J4489 Other specified chronic obstructive pulmonary disease: Secondary | ICD-10-CM | POA: Diagnosis not present

## 2024-02-25 DIAGNOSIS — Z860100 Personal history of colon polyps, unspecified: Secondary | ICD-10-CM

## 2024-02-25 DIAGNOSIS — E1169 Type 2 diabetes mellitus with other specified complication: Secondary | ICD-10-CM

## 2024-02-25 DIAGNOSIS — R319 Hematuria, unspecified: Secondary | ICD-10-CM

## 2024-02-25 DIAGNOSIS — J479 Bronchiectasis, uncomplicated: Secondary | ICD-10-CM | POA: Diagnosis not present

## 2024-02-25 DIAGNOSIS — E118 Type 2 diabetes mellitus with unspecified complications: Secondary | ICD-10-CM
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
## 2024-03-01 ENCOUNTER — Encounter: Payer: Self-pay | Admitting: Internal Medicine

## 2024-03-01 NOTE — Assessment & Plan Note (Signed)
 Saw pulmonary 10/08/23 - f/u asthma/COPD overlap syndrome and bronchiectasis. On trelegy. Breathing stable.

## 2024-03-01 NOTE — Assessment & Plan Note (Signed)
 Low carb diet and exercise.  Follow met b and A1c. On no medication.  No changes today.  Lab Results  Component Value Date   HGBA1C 6.6 (H) 02/21/2024

## 2024-03-01 NOTE — Assessment & Plan Note (Signed)
 [facility] 01/24/24 - Duplex ultrasound shows <40% stenosis bilaterally.. Continue risk factor modification. Recommended f/u in 24 months.

## 2024-03-01 NOTE — Assessment & Plan Note (Signed)
 Previously admitted with NSTEMI 08/2020.  09/12/20 - cath (50% mid/distal LAD). Continues micardis . Continue crestor  and zetia . Recent episode of chest pain as outlined. Took one NTG. Resolved. No further pain. Has noticed some sob with exertion. EKG - SR / SB with no acute ischemic changes. Discussed continued risk factor modification. LDL 51. Discussed f/u with cardiology for further testing. Last saw Dr Gollan 12/2022.  Get him back in with cardiology for further evaluation. If any change or worsening symptoms, he is to be evaluated immediately.

## 2024-03-01 NOTE — Assessment & Plan Note (Signed)
 Had f/u with pulmonary 09/2023. Continues trelegy. Breathing stable.

## 2024-03-01 NOTE — Assessment & Plan Note (Signed)
Colonoscopy 12/12/21:   RECTAL POLYP; HOT SNARE:  - TUBULAR ADENOMA WITH MUCOSAL PROLAPSE TYPE CHANGES.  - CAUTERIZED POLYP BASE FREE OF DYSPLASIA.  - NEGATIVE FOR HIGH-GRADE DYSPLASIA AND MALIGNANCY.   COLON POLYPS X2, ASCENDING; COLD BIOPSY AND COLD SNARE:  - MULTIPLE FRAGMENTS OF TUBULAR ADENOMAS.  - NEGATIVE FOR HIGH-GRADE DYSPLASIA AND MALIGNANCY.

## 2024-03-01 NOTE — Assessment & Plan Note (Signed)
 May have noticed minimal acid reflux as outlined. Pepcid  20mg  q day. Follow.

## 2024-03-01 NOTE — Assessment & Plan Note (Signed)
 On crestor  and zetia .  Low cholesterol diet and exercise.  Follow lipid panel and liver function tests. No changes in medication today.  Lab Results  Component Value Date   CHOL 113 02/21/2024   HDL 41.20 02/21/2024   LDLCALC 51 02/21/2024   TRIG 101.0 02/21/2024   CHOLHDL 3 02/21/2024

## 2024-03-01 NOTE — Assessment & Plan Note (Signed)
 Followed by [facility].

## 2024-03-01 NOTE — Assessment & Plan Note (Signed)
 Evaluated by Dr Twylla - 09/2021 - recommended CT urogram and cystoscopy.  Cysto (10/30/21) - No bladder mucosal abnormalities. Hematuria most likely secondary to BPH. Started finasteride  5 mg daily. Follow-up 1 year or earlier for recurrent hematuria. Had f/u with urology - 10/2022 - off plavix .  No recurring episodes of hematuria.  No changes made.  Recommended f/u in one year.  Had f/u with urology 11/14/23 - f/u hematuria. No further w/up warranted.

## 2024-03-02 ENCOUNTER — Telehealth: Payer: Self-pay | Admitting: Internal Medicine

## 2024-03-02 ENCOUNTER — Ambulatory Visit (INDEPENDENT_AMBULATORY_CARE_PROVIDER_SITE_OTHER)

## 2024-03-02 DIAGNOSIS — E538 Deficiency of other specified B group vitamins: Secondary | ICD-10-CM | POA: Diagnosis not present

## 2024-03-02 MED ORDER — FAMOTIDINE 20 MG PO TABS: 20.0000 mg | ORAL_TABLET | Freq: Two times a day (BID) | ORAL | 3 refills | Status: AC

## 2024-03-02 MED ORDER — CYANOCOBALAMIN 1000 MCG/ML IJ SOLN
1000.0000 ug | Freq: Once | INTRAMUSCULAR | Status: AC
Start: 2024-03-02 — End: 2024-03-02
  Administered 2024-03-02: 1000 ug via INTRAMUSCULAR

## 2024-03-02 NOTE — Telephone Encounter (Signed)
 Pt came in to office wanting to know if Dr. Glendia would prescribe medication for acid reflux and this was spoken about on his last appointment. Please advise.

## 2024-03-02 NOTE — Progress Notes (Addendum)
 Pt presented for their vitamin B12 injection. Pt was identified through two identifiers. Pt tolerated shot well in their left deltoid.

## 2024-03-02 NOTE — Telephone Encounter (Signed)
 At recent visit patient stated that Dr Glendia wanted him to increase Pepcid  20mg  every day to BID. Pt has increased his dose but will need a refill since he doubled up his current prescription.   I have sent in a 90 days supply at BID to [facility] pharmacy. Pt informed.

## 2024-03-17 ENCOUNTER — Ambulatory Visit: Attending: Cardiovascular Disease | Admitting: Cardiovascular Disease

## 2024-03-17 ENCOUNTER — Encounter: Payer: Self-pay | Admitting: Cardiovascular Disease

## 2024-03-17 VITALS — BP 132/70 | HR 60 | Ht 67.0 in | Wt 164.0 lb

## 2024-03-17 DIAGNOSIS — I25118 Atherosclerotic heart disease of native coronary artery with other forms of angina pectoris: Secondary | ICD-10-CM

## 2024-03-17 DIAGNOSIS — I771 Stricture of artery: Secondary | ICD-10-CM

## 2024-03-17 DIAGNOSIS — I10 Essential (primary) hypertension: Secondary | ICD-10-CM

## 2024-03-17 DIAGNOSIS — E118 Type 2 diabetes mellitus with unspecified complications: Secondary | ICD-10-CM

## 2024-03-17 DIAGNOSIS — E782 Mixed hyperlipidemia: Secondary | ICD-10-CM | POA: Diagnosis not present

## 2024-03-17 DIAGNOSIS — I779 Disorder of arteries and arterioles, unspecified: Secondary | ICD-10-CM
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
## 2024-03-17 NOTE — Patient Instructions (Addendum)
Medication Instructions:  No changes  If you need a refill on your cardiac medications before your next appointment, please call your pharmacy.   Lab work: No new labs needed  Testing/Procedures: Your provider has ordered a Lexiscan/ Exercise Myoview Stress test. This will take place at [facility]. Please report to the [facility] medical mall entrance. The volunteers at the first desk will direct you where to go.  [facility] MYOVIEW  Your provider has ordered a Stress Test with nuclear imaging. The purpose of this test is to evaluate the blood supply to your heart muscle. This procedure is referred to as a "Non-Invasive Stress Test." This is because other than having an IV started in your vein, nothing is inserted or "invades" your body. Cardiac stress tests are done to find areas of poor blood flow to the heart by determining the extent of coronary artery disease (CAD). Some patients exercise on a treadmill, which naturally increases the blood flow to your heart, while others who are unable to walk on a treadmill due to physical limitations will have a pharmacologic/chemical stress agent called Lexiscan . This medicine will mimic walking on a treadmill by temporarily increasing your coronary blood flow.   Please note: these test may take anywhere between 2-4 hours to complete  How to prepare for your Myoview test:  Nothing to eat for 6 hours prior to the test No caffeine for 24 hours prior to test No smoking 24 hours prior to test. Your medication may be taken with water.  If your doctor stopped a medication because of this test, do not take that medication. Ladies, please do not wear dresses.  Skirts or pants are appropriate. Please wear a short sleeve shirt. No perfume, cologne or lotion. Wear comfortable walking shoes. No heels!   PLEASE NOTIFY THE OFFICE AT LEAST 24 HOURS IN ADVANCE IF YOU ARE UNABLE TO KEEP YOUR APPOINTMENT.  [phone] AND  PLEASE NOTIFY NUCLEAR MEDICINE AT [facility] AT LEAST 24 HOURS  IN ADVANCE IF YOU ARE UNABLE TO KEEP YOUR APPOINTMENT. [phone]   Follow-Up: At [facility], you and your health needs are our priority.  As part of our continuing mission to provide you with exceptional heart care, we have created designated Provider Care Teams.  These Care Teams include your primary Cardiologist (physician) and Advanced Practice Providers (APPs -  Physician Assistants and Nurse Practitioners) who all work together to provide you with the care you need, when you need it.  You will need a follow up appointment in 12 months  Providers on your designated Care Team:   Nicolasa Ducking, NP Eula Listen, PA-C Cadence Fransico Michael, [place]  COVID-19 Vaccine Information can be found at: [email or web address] For questions related to vaccine distribution or appointments, please email [email or web address] or call [phone].

## 2024-04-06 ENCOUNTER — Other Ambulatory Visit: Payer: Self-pay | Admitting: Physician Assistant

## 2024-04-06 ENCOUNTER — Ambulatory Visit

## 2024-04-06 ENCOUNTER — Ambulatory Visit
Admission: RE | Admit: 2024-04-06 | Discharge: 2024-04-06 | Disposition: A | Discharge status: Home / Self Care | Source: Ambulatory Visit | Attending: Cardiovascular Disease | Admitting: Cardiovascular Disease

## 2024-04-06 DIAGNOSIS — I25118 Atherosclerotic heart disease of native coronary artery with other forms of angina pectoris: Secondary | ICD-10-CM | POA: Diagnosis not present

## 2024-04-06 LAB — NM MYOCAR MULTI W/SPECT W/WALL MOTION / EF
LV dias vol: 119 mL (ref 62–150)
LV sys vol: 55 mL (ref 4.2–5.8)
MPHR: 138 {beats}/min
Nuc Stress EF: 53 %
Peak HR: 77 {beats}/min
Percent HR: 55 %
Rest HR: 60 {beats}/min
Rest Nuclear Isotope Dose: 10.7 mCi
SDS: 0
SRS: 13
SSS: 9
ST Depression (mm): 0 mm
Stress Nuclear Isotope Dose: 30.4 mCi
TID: 1

## 2024-04-06 MED ORDER — REGADENOSON 0.4 MG/5ML IV SOLN
0.4000 mg | Freq: Once | INTRAVENOUS | Status: AC
  Administered 2024-04-06: 0.4 mg via INTRAVENOUS

## 2024-04-06 MED ORDER — TECHNETIUM TC 99M TETROFOSMIN IV KIT
30.0000 | PACK | Freq: Once | INTRAVENOUS | Status: AC | PRN
  Administered 2024-04-06: 30.38 via INTRAVENOUS

## 2024-04-06 MED ORDER — TECHNETIUM TC 99M TETROFOSMIN IV KIT
10.7400 | PACK | Freq: Once | INTRAVENOUS | Status: AC | PRN
  Administered 2024-04-06: 10.74 via INTRAVENOUS

## 2024-04-06 NOTE — Progress Notes (Signed)
     James Peck presented for a nuclear stress test today.  I Lesley LITTIE Maffucci, PA-C, provided direct supervision and was present during the stress portion of the study today, which was completed without significant symptoms, immediate complications, or acute ST/T changes on ECG.  Stress imaging is pending at this time.  Preliminary ECG findings may be listed in the chart, but the stress test result will not be finalized until perfusion imaging is complete.  Lesley LITTIE Maffucci, PA-C  04/06/2024, 9:52 AM

## 2024-04-08 ENCOUNTER — Ambulatory Visit (INDEPENDENT_AMBULATORY_CARE_PROVIDER_SITE_OTHER)

## 2024-04-08 ENCOUNTER — Other Ambulatory Visit (HOSPITAL_COMMUNITY): Payer: Self-pay

## 2024-04-08 DIAGNOSIS — E538 Deficiency of other specified B group vitamins: Secondary | ICD-10-CM

## 2024-04-08 DIAGNOSIS — Z23 Encounter for immunization: Secondary | ICD-10-CM

## 2024-04-08 MED ORDER — CYANOCOBALAMIN 1000 MCG/ML IJ SOLN
1000.0000 ug | Freq: Once | INTRAMUSCULAR | Status: AC
  Administered 2024-04-08: 1000 ug via INTRAMUSCULAR

## 2024-04-08 MED FILL — Fluticasone-Umeclidinium-Vilanterol AEPB 100-62.5-25 MCG/ACT: RESPIRATORY_TRACT | 90 days supply | Qty: 180 | Fill #3 | Status: AC

## 2024-04-08 NOTE — Progress Notes (Signed)
 Patient was administered a B12 injection into his right deltoid. Patient tolerated the B12 injection well.  Patient also was administered a Flu vaccine in his left deltoid. Patient tolerated the flu vaccine well.

## 2024-04-09 ENCOUNTER — Telehealth: Payer: Self-pay | Admitting: Cardiovascular Disease

## 2024-04-09 NOTE — Telephone Encounter (Signed)
 Wife Mort) called to follow up on patient's NM Myocar Multi W/Spect W/Wall Motion test results.

## 2024-04-09 NOTE — Telephone Encounter (Signed)
 Called and spoke with patient's wife - advised that the procedure has not be reviewed by provider and that we will call once it is reviewed

## 2024-04-12 ENCOUNTER — Ambulatory Visit: Payer: Self-pay | Admitting: Cardiovascular Disease

## 2024-04-13 ENCOUNTER — Other Ambulatory Visit: Payer: Self-pay | Admitting: Cardiovascular Disease

## 2024-04-13 ENCOUNTER — Other Ambulatory Visit: Payer: Self-pay | Admitting: Internal Medicine

## 2024-04-13 ENCOUNTER — Other Ambulatory Visit: Payer: Self-pay | Admitting: Emergency Medicine

## 2024-04-13 DIAGNOSIS — I2510 Atherosclerotic heart disease of native coronary artery without angina pectoris: Secondary | ICD-10-CM

## 2024-04-13 DIAGNOSIS — I25119 Atherosclerotic heart disease of native coronary artery with unspecified angina pectoris: Secondary | ICD-10-CM

## 2024-04-16 ENCOUNTER — Ambulatory Visit: Admitting: Pulmonary Disease

## 2024-04-16 ENCOUNTER — Encounter: Payer: Self-pay | Admitting: Pulmonary Disease

## 2024-04-16 VITALS — BP 132/60 | HR 74 | Temp 97.6°F | Ht 67.0 in | Wt 165.8 lb

## 2024-04-16 DIAGNOSIS — R0602 Shortness of breath: Secondary | ICD-10-CM | POA: Diagnosis not present

## 2024-04-16 DIAGNOSIS — J479 Bronchiectasis, uncomplicated: Secondary | ICD-10-CM

## 2024-04-16 DIAGNOSIS — I25119 Atherosclerotic heart disease of native coronary artery with unspecified angina pectoris: Secondary | ICD-10-CM

## 2024-04-16 DIAGNOSIS — I2510 Atherosclerotic heart disease of native coronary artery without angina pectoris: Secondary | ICD-10-CM

## 2024-04-16 DIAGNOSIS — Z87891 Personal history of nicotine dependence: Secondary | ICD-10-CM

## 2024-04-16 DIAGNOSIS — J4489 Other specified chronic obstructive pulmonary disease: Secondary | ICD-10-CM

## 2024-04-16 NOTE — Progress Notes (Signed)
 Subjective:    Patient ID: James Peck, male    DOB: 1941/05/14, 83 y.o.   MRN: [id number]  Patient Care Team: Glendia Shad, MD as PCP - General (Internal Medicine) Tamea Dedra CROME, MD as Consulting Physician (Pulmonary Disease)  Chief Complaint  Patient presents with   COPD    Dry cough. Shortness of breath on exertion.     BACKGROUND/INTERVAL:83 year old former smoker who follows up for the issue of cough and shortness of breath.  He has known asthma/COPD overlap syndrome and bronchiectasis.  Last seen by me on 08 October 2023, he presents for follow-up today.  Recent issues with increased dyspnea on exertion.  HPI Discussed the use of AI scribe software for clinical note transcription with the patient, who gave verbal consent to proceed.  History of Present Illness   James Peck is an 83 year old male with COPD who presents for follow-up.  He experiences shortness of breath upon exertion. A recent episode led to a consultation with a cardiologist, where a stress test was conducted approximately a week ago. He is scheduled for a CT scan as a follow-up, and a cardiac catheterization may be considered based on the CT results.  He is currently using Trelegy for COPD management and reports that it is effective. He experiences a cough primarily in the mornings, which he attributes to drainage upon waking. The cough is not severe and does not persist throughout the day.  He has received his flu vaccine.      DATA 04/18/2020 PFTs: FEV1 1.40 L or 57% predicted, FVC 2.48 L or 71% predicted, FEV1/FVC 57%.  There is significant bronchodilator response with 17% change in FEV1 postbronchodilator.  Moderate air trapping, mild diffusion capacity impairment, consistent with COPD/emphysema with reversible airways component. 09/12/2020 2D echo: LVEF 60 to 65%, no regional wall motion normalities.  Grade I DD. 09/12/2020 left heart cath:Insignificant coronary artery disease with patent stent  proximal/mid RCA, 50% stenosis mid/distal LAD with muscle bridge (Paraschos). 12/27/2020 CT chest: Emphysema, lower lobe posterior bronchiectasis, not new volume of retained secretions in bronchus.  No lesions of concern.  Calcified atherosclerosis. 03/20/2022 chest x-ray PA and lateral: No active cardiopulmonary disease, hyperinflation consistent with COPD. 04/06/2024 nuclear stress test: Findings are consistent with infarction.  Study is intermediate risk.  No ST elevation was noted.  ECG negative for ischemia.  LV perfusion is abnormal.  There is defect in the moderate reduction in uptake present in the apical to mid anterior that is fixed consistent with infarction.  Defect is consistent with abnormal perfusion in the left circumflex territory.   Review of Systems A 10 point review of systems was performed and it is as noted above otherwise negative.   Patient Active Problem List   Diagnosis Date Noted   Shoulder pain, left 10/23/2023   Memory change 06/21/2023   Diabetes mellitus type 2 with complications (HCC) 01/07/2023   BPH (benign prostatic hyperplasia) 11/15/2022   Asthma-COPD overlap syndrome (HCC) 01/31/2022   Bronchiectasis without complication (HCC) 01/31/2022   Hematuria 10/01/2021   History of colon polyps 08/06/2021   Hyponatremia 04/09/2021   Subclavian artery stenosis, left (HCC) 02/04/2021   Carotid artery disease (HCC) 02/04/2021   Coronary artery disease    Chest pain    NSTEMI (non-ST elevated myocardial infarction) (HCC)    COPD exacerbation (HCC)    Allergic rhinitis 04/12/2020   Emphysema, unspecified (HCC) 04/12/2020   Former smoker 04/12/2020   Lung nodules 03/27/2020   Renal  lesion 03/27/2020   Abnormal abdominal CT scan 02/16/2020   Right rotator cuff tear 08/19/2019   Cough 06/30/2019   Healthcare maintenance 08/28/2018   Hyperglycemia 12/22/2017   Degenerative arthritis of left knee 06/04/2017   Degenerative tear of meniscus of left knee 02/14/2015    Acid reflux 01/27/2015   Adenomatous colon polyp 01/27/2015   Allergy 01/27/2015   Arteriosclerosis of coronary artery 01/27/2015   Lumbar radiculopathy 01/27/2015   Coronary artery disease with angina pectoris (HCC) 01/07/2014   Benign essential HTN 10/21/2013   HLD (hyperlipidemia) 10/21/2013    Social History   Tobacco Use   Smoking status: Former    Current packs/day: 0.00    Average packs/day: 1 pack/day for 40.0 years (40.0 ttl pk-yrs)    Types: Cigarettes    Start date: 21    Quit date: 2000    Years since quitting: 25.7   Smokeless tobacco: Never  Substance Use Topics   Alcohol use: No    Allergies  Allergen Reactions   Ace Inhibitors Cough   Enalapril Cough    Current Meds  Medication Sig   albuterol  (VENTOLIN  HFA) 108 (90 Base) MCG/ACT inhaler Inhale 1 puff into the lungs every 6 (six) hours as needed for wheezing or shortness of breath.   aspirin  EC 81 MG tablet Take 81 mg by mouth daily.    calcium  carbonate (OS-CAL - DOSED IN MG OF ELEMENTAL CALCIUM ) 1250 (500 CA) MG tablet Take 1 tablet by mouth daily.    cetirizine  (ZYRTEC ) 10 MG tablet TAKE 1 TABLET BY MOUTH DAILY   CINNAMON  PO Take 1,200 mg by mouth 2 times daily at 12 noon and 4 pm.   ezetimibe  (ZETIA ) 10 MG tablet Take 1 tablet (10 mg total) by mouth daily.   famotidine  (PEPCID ) 20 MG tablet Take 1 tablet (20 mg total) by mouth 2 (two) times daily.   finasteride  (PROSCAR ) 5 MG tablet TAKE ONE TABLET EVERY DAY   fluticasone  (FLONASE ) 50 MCG/ACT nasal spray USE 2 SPRAYS IN EACH NOSTRIL ONCE DAILY AS DIRECTED   Fluticasone -Umeclidin-Vilant (TRELEGY ELLIPTA ) 100-62.5-25 MCG/ACT AEPB INHALE ONE PUFF INTO THE LUNGS EVERY DAY   GLUCOSAMINE-CHONDROITIN PO Take 1 capsule by mouth daily.   Misc Natural Products (BLACK CHERRY CONCENTRATE PO) Take 1,000 mg daily by mouth.   nitroGLYCERIN  (NITROSTAT ) 0.4 MG SL tablet Place 1 tablet (0.4 mg total) under the tongue every 5 (five) minutes as needed for chest pain.    Omega-3 Fatty Acids (FISH OIL) 1000 MG CAPS Take 1,000 mg by mouth daily.    rosuvastatin  (CRESTOR ) 20 MG tablet Take 1 tablet (20 mg total) by mouth daily.   sildenafil  (REVATIO ) 20 MG tablet TAKE 1 TO 2 TABLETS BY MOUTH EVERY DAY AS NEEDED   telmisartan  (MICARDIS ) 40 MG tablet TAKE 1 TABLET BY MOUTH ONCE DAILY    Immunization History  Administered Date(s) Administered   Fluad Quad(high Dose 65+) 04/09/2019, 04/04/2021, 04/22/2023   INFLUENZA, HIGH DOSE SEASONAL PF 04/23/2017, 04/29/2018, 04/09/2019, 05/05/2020, 05/10/2022, 04/08/2024   Influenza Split 05/11/2014   Influenza-Unspecified 04/25/2015, 04/23/2017   PFIZER(Purple Top)SARS-COV-2 Vaccination 08/28/2019, 09/18/2019   Respiratory Syncytial Virus Vaccine,Recomb Aduvanted(Arexvy) 08/13/2023   Tdap 03/11/2013        Objective:     BP 132/60   Pulse 74   Temp 97.6 F (36.4 C) (Temporal)   Ht 5' 7 (1.702 m)   Wt 165 lb 12.8 oz (75.2 kg)   SpO2 96%   BMI 25.97 kg/m   SpO2:  96 %  GENERAL: Well-developed, well-nourished elderly gentleman, looks younger than stated age, in no acute distress, fully ambulatory.  No conversational dyspnea. HEAD: Normocephalic, atraumatic.  EYES: Pupils equal, round, reactive to light.  No scleral icterus.  MOUTH: Oral mucosa moist, intact dentition. NECK: Supple. No thyromegaly. Trachea midline. No JVD.  No adenopathy. PULMONARY: Good air entry bilaterally.  Coarse, otherwise, no adventitious sounds. CARDIOVASCULAR: S1 and S2. Regular rate and rhythm.  No rubs, murmurs or gallops heard. ABDOMEN: Benign. MUSCULOSKELETAL: No joint deformity, no clubbing, no edema.  NEUROLOGIC: No focal deficit, no gait disturbance, speech is fluent. SKIN: Intact,warm,dry. PSYCH: Mood and behavior normal.         Assessment & Plan:     ICD-10-CM   1. Asthma-COPD overlap syndrome (HCC)  J44.89 Pulmonary function test    2. Bronchiectasis without complication (HCC)  J47.9     3. Shortness of  breath  R06.02 Pulmonary function test    4. Coronary artery disease involving native coronary artery of native heart with angina pectoris (HCC)  I25.119       Orders Placed This Encounter  Procedures   Pulmonary function test    Standing Status:   Future    Expiration Date:   04/16/2025    Where should this test be performed?:   Outpatient Pulmonary    What type of PFT is being ordered?:   Full PFT   Discussion:    Chronic obstructive pulmonary disease (COPD) COPD with exertional dyspnea, characterized by shortness of breath upon exertion and a morning cough due to drainage, which resolves during the day. Trelegy is providing adequate symptom control. Recent stress test showed abnormalities, necessitating further cardiac evaluation to rule out cardiac causes of dyspnea. - Continue Trelegy. - Order pulmonary function test to assess current lung function. - Schedule follow-up appointment in two months to monitor symptoms and treatment efficacy.     Coronary artery disease Patient is following with cardiology.  Under investigation.  May be adding to his dyspnea issues.  Will see the patient in follow-up in 2 months time he is to contact us  prior to that time should any problems arise.  Advised if symptoms do not improve or worsen, to please contact office for sooner follow up or seek emergency care.    I spent 30 minutes of dedicated to the care of this patient on the date of this encounter to include pre-visit review of records, face-to-face time with the patient discussing conditions above, post visit ordering of testing, clinical documentation with the electronic health record, making appropriate referrals as documented, and communicating necessary findings to members of the patients care team.     C. Leita Sanders, MD Advanced Bronchoscopy PCCM [facility]    *This note was generated using voice recognition software/Dragon and/or AI transcription program.   Despite best efforts to proofread, errors can occur which can change the meaning. Any transcriptional errors that result from this process are unintentional and may not be fully corrected at the time of dictation.

## 2024-04-16 NOTE — Patient Instructions (Signed)
 VISIT SUMMARY:  James Peck, an 83 year old male with COPD, came in for a follow-up visit. He experiences shortness of breath when exerting himself and has a morning cough that resolves during the day. He recently had a stress test and is scheduled for a CT scan to further evaluate his condition. He is currently using Trelegy for COPD management and finds it effective. He has also received his flu shot.  YOUR PLAN:  -CHRONIC OBSTRUCTIVE PULMONARY DISEASE (COPD): COPD is a chronic lung condition that makes it hard to breathe. You experience shortness of breath when exerting yourself and have a morning cough due to drainage, which resolves during the day. Continue using Trelegy as it is effectively managing your symptoms. We will order a pulmonary function test to assess your current lung function. Please schedule a follow-up appointment in two months to monitor your symptoms and the effectiveness of your treatment.  INSTRUCTIONS:  Please schedule a pulmonary function test to assess your current lung function. Additionally, schedule a follow-up appointment in two months to monitor your symptoms and the effectiveness of your treatment.

## 2024-04-29 NOTE — Addendum Note (Signed)
 Encounter addended by: Gladis Burnard SAILOR on: 04/29/2024 11:05 AM  Actions taken: Imaging Exam ended

## 2024-05-05 NOTE — Progress Notes (Signed)
 James Peck                                          MRN: [id number]   05/05/2024   The [facility] Quality Team Specialist reviewed this patient medical record for the purposes of chart review for care gap closure. The following were reviewed: chart review for care gap closure-kidney health evaluation for diabetes:eGFR  and uACR.    [facility] Quality Team

## 2024-05-11 ENCOUNTER — Ambulatory Visit (INDEPENDENT_AMBULATORY_CARE_PROVIDER_SITE_OTHER)

## 2024-05-11 DIAGNOSIS — E538 Deficiency of other specified B group vitamins: Secondary | ICD-10-CM

## 2024-05-11 MED ORDER — CYANOCOBALAMIN 1000 MCG/ML IJ SOLN
1000.0000 ug | Freq: Once | INTRAMUSCULAR | Status: AC
  Administered 2024-05-11: 1000 ug via INTRAMUSCULAR

## 2024-05-11 NOTE — Progress Notes (Signed)
 Pt received B12 injection in Left  deltoid muscle. Pt tolerated it well with no complaints or concerns.

## 2024-05-15 ENCOUNTER — Ambulatory Visit: Admitting: Cardiovascular Disease

## 2024-06-02 ENCOUNTER — Ambulatory Visit: Attending: Cardiovascular Disease

## 2024-06-02 DIAGNOSIS — I2510 Atherosclerotic heart disease of native coronary artery without angina pectoris: Secondary | ICD-10-CM

## 2024-06-02 DIAGNOSIS — I25119 Atherosclerotic heart disease of native coronary artery with unspecified angina pectoris: Secondary | ICD-10-CM

## 2024-06-03 LAB — ECHOCARDIOGRAM COMPLETE
AR max vel: 1.61 cm2
AV Area VTI: 1.73 cm2
AV Area mean vel: 1.62 cm2
AV Mean grad: 5 mmHg
AV Peak grad: 9.5 mmHg
Ao pk vel: 1.54 m/s
Area-P 1/2: 3.42 cm2
S' Lateral: 2.6 cm

## 2024-06-05 ENCOUNTER — Encounter: Payer: Self-pay | Admitting: Emergency Medicine

## 2024-06-05 ENCOUNTER — Ambulatory Visit: Payer: Self-pay | Admitting: Cardiovascular Disease

## 2024-06-10 ENCOUNTER — Ambulatory Visit (INDEPENDENT_AMBULATORY_CARE_PROVIDER_SITE_OTHER): Admitting: *Deleted

## 2024-06-10 DIAGNOSIS — E538 Deficiency of other specified B group vitamins: Secondary | ICD-10-CM

## 2024-06-10 MED ORDER — CYANOCOBALAMIN 1000 MCG/ML IJ SOLN
1000.0000 ug | Freq: Once | INTRAMUSCULAR | Status: AC
  Administered 2024-06-10: 1000 ug via INTRAMUSCULAR

## 2024-06-10 NOTE — Progress Notes (Signed)
Pt received B12 injection in right deltoid muscle. Pt tolerated it well with no complaints or concerns.  

## 2024-06-12 DIAGNOSIS — G43109 Migraine with aura, not intractable, without status migrainosus: Secondary | ICD-10-CM | POA: Diagnosis not present

## 2024-06-12 DIAGNOSIS — H35373 Puckering of macula, bilateral: Secondary | ICD-10-CM | POA: Diagnosis not present

## 2024-06-12 DIAGNOSIS — H43813 Vitreous degeneration, bilateral: Secondary | ICD-10-CM | POA: Diagnosis not present

## 2024-06-12 DIAGNOSIS — D3131 Benign neoplasm of right choroid: Secondary | ICD-10-CM | POA: Diagnosis not present

## 2024-06-12 LAB — OPHTHALMOLOGY REPORT-SCANNED

## 2024-06-16 ENCOUNTER — Encounter: Payer: Self-pay | Admitting: Pulmonary Disease

## 2024-06-16 ENCOUNTER — Ambulatory Visit

## 2024-06-16 ENCOUNTER — Ambulatory Visit: Admitting: Pulmonary Disease

## 2024-06-16 VITALS — BP 104/62 | HR 74 | Temp 98.1°F | Ht 67.0 in | Wt 165.4 lb

## 2024-06-16 DIAGNOSIS — R051 Acute cough: Secondary | ICD-10-CM

## 2024-06-16 DIAGNOSIS — J479 Bronchiectasis, uncomplicated: Secondary | ICD-10-CM

## 2024-06-16 DIAGNOSIS — R0602 Shortness of breath: Secondary | ICD-10-CM

## 2024-06-16 DIAGNOSIS — Z87891 Personal history of nicotine dependence: Secondary | ICD-10-CM

## 2024-06-16 DIAGNOSIS — J4489 Other specified chronic obstructive pulmonary disease: Secondary | ICD-10-CM

## 2024-06-16 LAB — PULMONARY FUNCTION TEST
DL/VA % pred: 72 %
DL/VA: 2.78 ml/min/mmHg/L
DLCO unc % pred: 61 %
DLCO unc: 13.31 ml/min/mmHg
FEF 25-75 Post: 0.67 L/s
FEF 25-75 Pre: 0.52 L/s
FEF2575-%Change-Post: 30 %
FEF2575-%Pred-Post: 42 %
FEF2575-%Pred-Pre: 32 %
FEV1-%Change-Post: 7 %
FEV1-%Pred-Post: 59 %
FEV1-%Pred-Pre: 55 %
FEV1-Post: 1.43 L
FEV1-Pre: 1.33 L
FEV1FVC-%Change-Post: 3 %
FEV1FVC-%Pred-Pre: 74 %
FEV6-%Change-Post: 3 %
FEV6-%Pred-Post: 80 %
FEV6-%Pred-Pre: 77 %
FEV6-Post: 2.55 L
FEV6-Pre: 2.47 L
FEV6FVC-%Change-Post: 0 %
FEV6FVC-%Pred-Post: 105 %
FEV6FVC-%Pred-Pre: 106 %
FVC-%Change-Post: 3 %
FVC-%Pred-Post: 75 %
FVC-%Pred-Pre: 72 %
FVC-Post: 2.61 L
FVC-Pre: 2.51 L
Post FEV1/FVC ratio: 55 %
Post FEV6/FVC ratio: 98 %
Pre FEV1/FVC ratio: 53 %
Pre FEV6/FVC Ratio: 98 %
RV % pred: 162 %
RV: 4.1 L
TLC % pred: 107 %
TLC: 6.97 L

## 2024-06-16 LAB — NITRIC OXIDE: Nitric Oxide: 23

## 2024-06-16 MED ORDER — PREDNISONE 20 MG PO TABS: 20.0000 mg | ORAL_TABLET | Freq: Every day | ORAL | 0 refills | Status: AC

## 2024-06-16 NOTE — Progress Notes (Signed)
 Subjective:    Patient ID: James Peck, male    DOB: Jul 15, 1941, 83 y.o.   MRN: [id number]  Patient Care Team: Glendia Shad, MD as PCP - General (Internal Medicine) Tamea Dedra CROME, MD as Consulting Physician (Pulmonary Disease)  Chief Complaint  Patient presents with   Asthma    Dry cough. Shortness of breath on exertion. No wheezing.     BACKGROUND/INTERVAL:83 year old former smoker who follows up for the issue of cough and shortness of breath.  He has known asthma/COPD overlap syndrome and bronchiectasis.  Last seen by me on 16 April 2024, he presents for follow-up today.  Recent issues with acute cough.   HPI Discussed the use of AI scribe software for clinical note transcription with the patient, who gave verbal consent to proceed.  History of Present Illness   James Peck is an 83 year old male with asthma COPD overlap who presents for follow-up.  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 The cough worsens with dust exposure, and he sometimes uses a mask during these activities.  He uses a rescue inhaler when he notices increased coughing, which he did last night.  This was effective.  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  He has received his flu vaccine.     He had PFTs today which showed his lung function to be stable from prior PFTs of 2021.  DATA 04/18/2020 PFTs: FEV1 1.40 L or 57% predicted, FVC 2.48 L or 71% predicted, FEV1/FVC 57%.  There is significant bronchodilator response with 17% change in FEV1 postbronchodilator.  Moderate air trapping, mild diffusion capacity impairment, consistent with COPD/emphysema with reversible airways component. 09/12/2020 2D echo: LVEF 60 to 65%, no regional wall motion normalities.  Grade I DD. 09/12/2020 left heart cath:Insignificant coronary artery disease with patent stent proximal/mid RCA,  50% stenosis mid/distal LAD with muscle bridge (Paraschos). 12/27/2020 CT chest: Emphysema, lower lobe posterior bronchiectasis, not new volume of retained secretions in bronchus.  No lesions of concern.  Calcified atherosclerosis. 03/20/2022 chest x-ray PA and lateral: No active cardiopulmonary disease, hyperinflation consistent with COPD. 04/06/2024 nuclear stress test: Findings are consistent with infarction.  Study is intermediate risk.  No ST elevation was noted.  ECG negative for ischemia.  LV perfusion is abnormal.  There is defect in the moderate reduction in uptake present in the apical to mid anterior that is fixed consistent with infarction.  Defect is consistent with abnormal perfusion in the left circumflex territory. 06/02/2024 echocardiogram: LVEF 55 to 60%, no regional wall motion abnormalities.  Grade 1 diastolic dysfunction.  RV function normal.  No valvular abnormalities. 06/16/2024 PFTs: FEV1 1.33 L or 55% predicted, FVC 2.51 L or 72% predicted, FEV1/FVC 53%, there is a small airways reversible component.  Lung volumes show mild hyperinflation and air trapping.  Diffusion capacity moderately reduced.  Compared to prior PFTs overall no significant change.  Review of Systems A 10 point review of systems was performed and it is as noted above otherwise negative.   Patient Active Problem List   Diagnosis Date Noted   Shoulder pain, left 10/23/2023   Memory change 06/21/2023   Diabetes mellitus type 2 with complications (HCC) 01/07/2023   BPH (benign prostatic hyperplasia) 11/15/2022   Asthma-COPD overlap syndrome (HCC) 01/31/2022   Bronchiectasis without complication (HCC) 01/31/2022   Hematuria 10/01/2021   History of colon polyps 08/06/2021  Hyponatremia 04/09/2021   Subclavian artery stenosis, left 02/04/2021   Carotid artery disease 02/04/2021   Coronary artery disease    Chest pain    NSTEMI (non-ST elevated myocardial infarction) (HCC)    COPD exacerbation (HCC)     Allergic rhinitis 04/12/2020   Emphysema, unspecified (HCC) 04/12/2020   Former smoker 04/12/2020   Lung nodules 03/27/2020   Renal lesion 03/27/2020   Abnormal abdominal CT scan 02/16/2020   Right rotator cuff tear 08/19/2019   Cough 06/30/2019   Healthcare maintenance 08/28/2018   Hyperglycemia 12/22/2017   Degenerative arthritis of left knee 06/04/2017   Degenerative tear of meniscus of left knee 02/14/2015   Acid reflux 01/27/2015   Adenomatous colon polyp 01/27/2015   Allergy 01/27/2015   Arteriosclerosis of coronary artery 01/27/2015   Lumbar radiculopathy 01/27/2015   Coronary artery disease with angina pectoris 01/07/2014   Benign essential HTN 10/21/2013   HLD (hyperlipidemia) 10/21/2013    Social History   Tobacco Use   Smoking status: Former    Current packs/day: 0.00    Average packs/day: 1 pack/day for 40.0 years (40.0 ttl pk-yrs)    Types: Cigarettes    Start date: 76    Quit date: 2000    Years since quitting: 25.8   Smokeless tobacco: Never  Substance Use Topics   Alcohol use: No    Allergies  Allergen Reactions   Ace Inhibitors Cough   Enalapril Cough    Current Meds  Medication Sig   albuterol  (VENTOLIN  HFA) 108 (90 Base) MCG/ACT inhaler Inhale 1 puff into the lungs every 6 (six) hours as needed for wheezing or shortness of breath.   aspirin  EC 81 MG tablet Take 81 mg by mouth daily.    calcium  carbonate (OS-CAL - DOSED IN MG OF ELEMENTAL CALCIUM ) 1250 (500 CA) MG tablet Take 1 tablet by mouth daily.    cetirizine  (ZYRTEC ) 10 MG tablet TAKE 1 TABLET BY MOUTH DAILY   CINNAMON  PO Take 1,200 mg by mouth 2 times daily at 12 noon and 4 pm.   ezetimibe  (ZETIA ) 10 MG tablet Take 1 tablet (10 mg total) by mouth daily.   famotidine  (PEPCID ) 20 MG tablet Take 1 tablet (20 mg total) by mouth 2 (two) times daily.   finasteride  (PROSCAR ) 5 MG tablet TAKE ONE TABLET EVERY DAY   fluticasone  (FLONASE ) 50 MCG/ACT nasal spray USE 2 SPRAYS IN EACH NOSTRIL ONCE  DAILY AS DIRECTED   Fluticasone -Umeclidin-Vilant (TRELEGY ELLIPTA ) 100-62.5-25 MCG/ACT AEPB INHALE ONE PUFF INTO THE LUNGS EVERY DAY   GLUCOSAMINE-CHONDROITIN PO Take 1 capsule by mouth daily.   Misc Natural Products (BLACK CHERRY CONCENTRATE PO) Take 1,000 mg daily by mouth.   nitroGLYCERIN  (NITROSTAT ) 0.4 MG SL tablet Place 1 tablet (0.4 mg total) under the tongue every 5 (five) minutes as needed for chest pain.   Omega-3 Fatty Acids (FISH OIL) 1000 MG CAPS Take 1,000 mg by mouth daily.    predniSONE  (DELTASONE ) 20 MG tablet Take 1 tablet (20 mg total) by mouth daily with breakfast for 5 days.   rosuvastatin  (CRESTOR ) 20 MG tablet Take 1 tablet (20 mg total) by mouth daily.   sildenafil  (REVATIO ) 20 MG tablet TAKE 1 TO 2 TABLETS BY MOUTH EVERY DAY AS NEEDED   telmisartan  (MICARDIS ) 40 MG tablet TAKE 1 TABLET BY MOUTH ONCE DAILY    Immunization History  Administered Date(s) Administered   Fluad Quad(high Dose 65+) 04/09/2019, 04/04/2021, 04/22/2023   INFLUENZA, HIGH DOSE SEASONAL PF 04/23/2017, 04/29/2018, 04/09/2019, 05/05/2020,  05/10/2022, 04/08/2024   Influenza Split 05/11/2014   Influenza-Unspecified 04/25/2015, 04/23/2017   PFIZER(Purple Top)SARS-COV-2 Vaccination 08/28/2019, 09/18/2019   Respiratory Syncytial Virus Vaccine,Recomb Aduvanted(Arexvy) 08/13/2023   Tdap 03/11/2013        Objective:     BP 104/62   Pulse 74   Temp 98.1 F (36.7 C) (Temporal)   Ht 5' 7 (1.702 m)   Wt 165 lb 6.4 oz (75 kg)   SpO2 96%   BMI 25.91 kg/m   SpO2: 96 %  GENERAL: Well-developed, well-nourished elderly gentleman, looks younger than stated age, in no acute distress, fully ambulatory.  No conversational dyspnea. HEAD: Normocephalic, atraumatic.  EYES: Pupils equal, round, reactive to light.  No scleral icterus.  MOUTH: Oral mucosa moist, intact dentition. NECK: Supple. No thyromegaly. Trachea midline. No JVD.  No adenopathy. PULMONARY: Good air entry bilaterally.  Few rhonchi  noted, no wheezing. CARDIOVASCULAR: S1 and S2. Regular rate and rhythm.  No rubs, murmurs or gallops heard. ABDOMEN: Benign. MUSCULOSKELETAL: No joint deformity, no clubbing, no edema.  NEUROLOGIC: No focal deficit, no gait disturbance, speech is fluent. SKIN: Intact,warm,dry. PSYCH: Mood and behavior normal.   Lab Results  Component Value Date   NITRICOXIDE 23 06/16/2024  *This result suggests low (<25) Type II (T2) airway inflammation indicating a low likelihood of active T2-driven airway inflammation.  In a patient with active T2 driven asthma management it suggests good control.  Recent Results (from the past 2160 hours)  NM Myocar Multi W/Spect W/Wall Motion / EF     Status: None   Collection Time: 04/06/24  1:40 PM  Result Value Ref Range   Rest HR 60.0 bpm   Rest BP 148/67 mmHg   Peak HR 77 bpm   Peak BP 149/58 mmHg   MPHR 138 bpm   Percent HR 55.0 %   ST Depression (mm) 0 mm   Rest Nuclear Isotope Dose 10.7 mCi   Stress Nuclear Isotope Dose 30.4 mCi   SSS 9.0    SRS 13.0    SDS 0.0    TID 1.00    Nuc Stress EF 53 %   LV sys vol 55.0 4.2 - 5.8 mL   LV dias vol 119.0 62 - 150 mL  ECHOCARDIOGRAM COMPLETE     Status: None   Collection Time: 06/02/24  9:46 AM  Result Value Ref Range   AR max vel 1.61 cm2   AV Peak grad 9.5 mmHg   Ao pk vel 1.54 m/s   S' Lateral 2.60 cm   Area-P 1/2 3.42 cm2   AV Area VTI 1.73 cm2   AV Mean grad 5.0 mmHg   AV Area mean vel 1.62 cm2   Est EF 55 - 60%   Pulmonary function test     Status: None (Preliminary result)   Collection Time: 06/16/24  8:44 AM  Result Value Ref Range   FVC-Pre 2.51 L   FVC-%Pred-Pre 72 %   FVC-Post 2.61 L   FVC-%Pred-Post 75 %   FVC-%Change-Post 3 %   FEV1-Pre 1.33 L   FEV1-%Pred-Pre 55 %   FEV1-Post 1.43 L   FEV1-%Pred-Post 59 %   FEV1-%Change-Post 7 %   FEV6-Pre 2.47 L   FEV6-%Pred-Pre 77 %   FEV6-Post 2.55 L   FEV6-%Pred-Post 80 %   FEV6-%Change-Post 3 %   Pre FEV1/FVC ratio 53 %    FEV1FVC-%Pred-Pre 74 %   Post FEV1/FVC ratio 55 %   FEV1FVC-%Change-Post 3 %   Pre FEV6/FVC Ratio 98 %  FEV6FVC-%Pred-Pre 106 %   Post FEV6/FVC ratio 98 %   FEV6FVC-%Pred-Post 105 %   FEV6FVC-%Change-Post 0 %   FEF 25-75 Pre 0.52 L/sec   FEF2575-%Pred-Pre 32 %   FEF 25-75 Post 0.67 L/sec   FEF2575-%Pred-Post 42 %   FEF2575-%Change-Post 30 %   RV 4.10 L   RV % pred 162 %   TLC 6.97 L   TLC % pred 107 %   DLCO unc 13.31 ml/min/mmHg   DLCO unc % pred 61 %   DL/VA 7.21 ml/min/mmHg/L   DL/VA % pred 72 %  Nitric oxide      Status: None   Collection Time: 06/16/24  9:39 AM  Result Value Ref Range   Nitric Oxide  23   *Discussed PFT and nitric oxide  results with patient.     Assessment & Plan:     ICD-10-CM   1. Asthma-COPD overlap syndrome (HCC)  J44.89 Nitric oxide     2. Acute cough  R05.1 Nitric oxide     3. Bronchiectasis without complication (HCC)  J47.9     4. Former smoker  Z87.891       Orders Placed This Encounter  Procedures   Nitric oxide     Meds ordered this encounter  Medications   predniSONE  (DELTASONE ) 20 MG tablet    Sig: Take 1 tablet (20 mg total) by mouth daily with breakfast for 5 days.    Dispense:  5 tablet    Refill:  0   Discussion:    Asthma-COPD overlap syndrome with acute cough and airway inflammation due to dust exposure Lung function is well-managed with no decline. 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 No major increased inflammation on examination. Cough worsened over the last week, likely related to dust exposure. - Prescribed prednisone  20 mg tabs, 1 tablet daily for 5 days to reduce airway inflammation. - Advised wearing a mask during dust exposure activities.  Acute cough Likely related to asthma-COPD overlap syndrome and recent dust exposure. No significant changes in lung function. - Continue current management for asthma-COPD overlap syndrome. - Use albuterol  as needed for  excessive cough.     Advised if symptoms do not improve or worsen, to please contact office for sooner follow up or seek emergency care.    I spent 30 minutes of dedicated to the care of this patient on the date of this encounter to include pre-visit review of records, face-to-face time with the patient discussing conditions above, post visit ordering of testing, clinical documentation with the electronic health record, making appropriate referrals as documented, and communicating necessary findings to members of the patients care team.     C. Leita Sanders, MD Advanced Bronchoscopy P[facility]    *This note was generated using voice recognition software/Dragon and/or AI transcription program.  Despite best efforts to proofread, errors can occur which can change the meaning. Any transcriptional errors that result from this process are unintentional and may not be fully corrected at the time of dictation.

## 2024-06-16 NOTE — Patient Instructions (Signed)
 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 He uses a rescue inhaler when needed and has received his flu shot.  YOUR PLAN:  -ASTHMA-COPD OVERLAP SYNDROME WITH ACUTE COUGH AND AIRWAY INFLAMMATION: Asthma-COPD overlap syndrome is a condition where a person has symptoms of both asthma and chronic obstructive pulmonary disease (COPD). 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 To reduce the inflammation, you have been prescribed prednisone , 1 tablet daily for 5 days. Additionally, it is advised to wear a mask during activities that expose you to dust.  -SHORTNESS OF BREATH: Your shortness of breath is likely related to your asthma-COPD overlap syndrome and recent dust exposure. There are no significant changes in your lung function. You should continue with your current management plan for asthma-COPD overlap syndrome.  INSTRUCTIONS:  Please take the prescribed prednisone  as directed, 1 tablet daily for 5 days. Make sure to wear a mask during activities that expose you to dust. Continue with your current management plan for asthma-COPD overlap syndrome. If your symptoms worsen or do not improve, please schedule a follow-up appointment.

## 2024-06-16 NOTE — Progress Notes (Signed)
 Full PFT completed today ? ?

## 2024-06-16 NOTE — Patient Instructions (Signed)
 Full PFT completed today ? ?

## 2024-06-17 ENCOUNTER — Ambulatory Visit: Payer: Self-pay | Admitting: Pulmonary Disease

## 2024-06-29 ENCOUNTER — Other Ambulatory Visit: Payer: Self-pay | Admitting: Urology

## 2024-07-02 ENCOUNTER — Other Ambulatory Visit

## 2024-07-02 DIAGNOSIS — E785 Hyperlipidemia, unspecified: Secondary | ICD-10-CM | POA: Diagnosis not present

## 2024-07-02 DIAGNOSIS — E118 Type 2 diabetes mellitus with unspecified complications: Secondary | ICD-10-CM

## 2024-07-02 DIAGNOSIS — Z125 Encounter for screening for malignant neoplasm of prostate: Secondary | ICD-10-CM

## 2024-07-02 LAB — CBC WITH DIFFERENTIAL/PLATELET
Basophils Absolute: 0 K/uL (ref 0.0–0.1)
Basophils Relative: 0.3 % (ref 0.0–3.0)
Eosinophils Absolute: 0.2 K/uL (ref 0.0–0.7)
Eosinophils Relative: 2 % (ref 0.0–5.0)
HCT: 42.4 % (ref 39.0–52.0)
Hemoglobin: 14.5 g/dL (ref 13.0–17.0)
Lymphocytes Relative: 21.7 % (ref 12.0–46.0)
Lymphs Abs: 2 K/uL (ref 0.7–4.0)
MCHC: 34.1 g/dL (ref 30.0–36.0)
MCV: 90.7 fl (ref 78.0–100.0)
Monocytes Absolute: 1.2 K/uL — ABNORMAL HIGH (ref 0.1–1.0)
Monocytes Relative: 13.4 % — ABNORMAL HIGH (ref 3.0–12.0)
Neutro Abs: 5.7 K/uL (ref 1.4–7.7)
Neutrophils Relative %: 62.6 % (ref 43.0–77.0)
Platelets: 374 K/uL (ref 150.0–400.0)
RBC: 4.68 Mil/uL (ref 4.22–5.81)
RDW: 13.7 % (ref 11.5–15.5)
WBC: 9.1 K/uL (ref 4.0–10.5)

## 2024-07-02 LAB — LIPID PANEL
Cholesterol: 98 mg/dL (ref 0–200)
HDL: 41.6 mg/dL (ref >39.00)
LDL Cholesterol: 44 mg/dL (ref 0–99)
NonHDL: 56.32
Total CHOL/HDL Ratio: 2
Triglycerides: 61 mg/dL (ref 0.0–149.0)
VLDL: 12.2 mg/dL (ref 0.0–40.0)

## 2024-07-02 LAB — BASIC METABOLIC PANEL WITH GFR
BUN: 14 mg/dL (ref 6–23)
CO2: 25 meq/L (ref 19–32)
Calcium: 9 mg/dL (ref 8.4–10.5)
Chloride: 99 meq/L (ref 96–112)
Creatinine, Ser: 1.01 mg/dL (ref 0.40–1.50)
GFR: 69.12 mL/min (ref >60.00)
Glucose, Bld: 123 mg/dL — ABNORMAL HIGH (ref 70–99)
Potassium: 4.4 meq/L (ref 3.5–5.1)
Sodium: 131 meq/L — ABNORMAL LOW (ref 135–145)

## 2024-07-02 LAB — HEPATIC FUNCTION PANEL
ALT: 19 U/L (ref 0–53)
AST: 16 U/L (ref 0–37)
Albumin: 4 g/dL (ref 3.5–5.2)
Alkaline Phosphatase: 53 U/L (ref 39–117)
Bilirubin, Direct: 0.1 mg/dL (ref 0.0–0.3)
Total Bilirubin: 0.6 mg/dL (ref 0.2–1.2)
Total Protein: 6.1 g/dL (ref 6.0–8.3)

## 2024-07-02 LAB — HEMOGLOBIN A1C: Hgb A1c MFr Bld: 6.5 % (ref 4.6–6.5)

## 2024-07-02 LAB — PSA, MEDICARE: PSA: 0.46 ng/mL (ref 0.10–4.00)

## 2024-07-02 LAB — MICROALBUMIN / CREATININE URINE RATIO
Creatinine,U: 74.2 mg/dL
Microalb Creat Ratio: 16.2 mg/g (ref 0.0–30.0)
Microalb, Ur: 1.2 mg/dL (ref 0.0–1.9)

## 2024-07-06 ENCOUNTER — Other Ambulatory Visit: Payer: Self-pay

## 2024-07-06 ENCOUNTER — Other Ambulatory Visit: Payer: Self-pay | Admitting: Pulmonary Disease

## 2024-07-06 ENCOUNTER — Other Ambulatory Visit (HOSPITAL_COMMUNITY): Payer: Self-pay

## 2024-07-06 ENCOUNTER — Encounter: Payer: Self-pay | Admitting: Internal Medicine

## 2024-07-06 ENCOUNTER — Ambulatory Visit (INDEPENDENT_AMBULATORY_CARE_PROVIDER_SITE_OTHER)

## 2024-07-06 ENCOUNTER — Ambulatory Visit: Admitting: Internal Medicine

## 2024-07-06 VITALS — BP 110/60 | HR 94 | Temp 98.0°F | Ht 67.0 in | Wt 164.0 lb

## 2024-07-06 DIAGNOSIS — R053 Chronic cough: Secondary | ICD-10-CM | POA: Diagnosis not present

## 2024-07-06 DIAGNOSIS — Z0000 Encounter for general adult medical examination without abnormal findings: Secondary | ICD-10-CM

## 2024-07-06 DIAGNOSIS — E871 Hypo-osmolality and hyponatremia: Secondary | ICD-10-CM

## 2024-07-06 DIAGNOSIS — R062 Wheezing: Secondary | ICD-10-CM | POA: Diagnosis not present

## 2024-07-06 DIAGNOSIS — E782 Mixed hyperlipidemia: Secondary | ICD-10-CM

## 2024-07-06 DIAGNOSIS — I700 Atherosclerosis of aorta: Secondary | ICD-10-CM | POA: Diagnosis not present

## 2024-07-06 DIAGNOSIS — E118 Type 2 diabetes mellitus with unspecified complications: Secondary | ICD-10-CM

## 2024-07-06 DIAGNOSIS — R0989 Other specified symptoms and signs involving the circulatory and respiratory systems: Secondary | ICD-10-CM | POA: Diagnosis not present

## 2024-07-06 LAB — SODIUM: Sodium: 135 meq/L (ref 135–145)

## 2024-07-06 LAB — MICROALBUMIN / CREATININE URINE RATIO
Creatinine,U: 106.4 mg/dL
Microalb Creat Ratio: 23.8 mg/g (ref 0.0–30.0)
Microalb, Ur: 2.5 mg/dL — ABNORMAL HIGH (ref 0.0–1.9)

## 2024-07-06 LAB — HM DIABETES FOOT EXAM

## 2024-07-06 MED ORDER — CEFDINIR 300 MG PO CAPS: 300.0000 mg | ORAL_CAPSULE | Freq: Two times a day (BID) | ORAL | 0 refills | Status: DC

## 2024-07-06 MED ORDER — PREDNISONE 10 MG PO TABS: ORAL_TABLET | ORAL | 0 refills | Status: AC

## 2024-07-06 MED ORDER — TRELEGY ELLIPTA 100-62.5-25 MCG/ACT IN AEPB
1.0000 | INHALATION_SPRAY | Freq: Every day | RESPIRATORY_TRACT | 3 refills | Status: AC
  Filled 2024-07-06: qty 180, 90d supply, fill #0

## 2024-07-06 NOTE — Progress Notes (Unsigned)
 Subjective:    Patient ID: James Peck, male    DOB: [birth year], 83 y.o.   MRN: [id number]  Patient here for  Chief Complaint  Patient presents with   Medical Management of Chronic Issues   Annual Exam   Cough   Temporomandibular Joint Pain    HPI Here for a physical exam. Had f/u with Dr Tamea 06/16/24 - f/u asthma/COPD and bronchiectasis. PFTs stable. Was given rx for prednisone . Recommended to continue trelegy and albuterol . Saw Dr Gollan 03/17/24 - f/u CAD and recent episode of chest pain. Had echo 05/2024 - normal left and right ventricular size and function. No significant valvular heart disease - ok. Stress test - small fixed defect in the basal to mid lateral wall c/w prior infarct/MI. Recommended to treat medically. Had f/u with urology 11/14/23 - f/u hematuria. No further w/up warranted. Had f/u with [facility] - f/u bilateral carotid artery stenosis - duplex <40% stenosis bilaterally. F/u in 24 months. Started pepcid  last visit. Denies acid reflux. Does report persistent increased cough and congestion. Increased sinus pressure and nasal congestion. Increased chest congestion. Both productive of yellow/green mucus. Notices persistent increased sob - present before visit with Dr Gollan. Increased cough. Using albuterol  inhaler more frequent. Continuing trelegy daily. Eating. No vomiting or diarrhea. No chest pain. Does report he saw ophthalmology - reported wavy vision - just peripheral vision. Tunnel vision - normal. No associated headache. Saw ophthalmology. Eye exam ok. Recommended MRI.    Past Medical History:  Diagnosis Date   Allergy    Arthritis    COPD (chronic obstructive pulmonary disease) (HCC)    Coronary artery disease    GERD (gastroesophageal reflux disease)    History of hiatal hernia    Hypercholesteremia    Hypertension    Past Surgical History:  Procedure Laterality Date   CARDIAC CATHETERIZATION     cardiac stents     CATARACT EXTRACTION W/PHACO Right  09/10/2017   Procedure: CATARACT EXTRACTION PHACO AND INTRAOCULAR LENS PLACEMENT (IOC);  Surgeon: Jaye Fallow, MD;  Location: [facility];  Service: Ophthalmology;  Laterality: Right;  US  01:01.7 AP% 15.3 CDE 9.38 Fluid pack lot # [id number]   CATARACT EXTRACTION W/PHACO Left 10/02/2017   Procedure: CATARACT EXTRACTION PHACO AND INTRAOCULAR LENS PLACEMENT (IOC);  Surgeon: Jaye Fallow, MD;  Location: [facility];  Service: Ophthalmology;  Laterality: Left;  US  00:42.3 AP% 16.2 CDE 6.85 Fluid Pack Lot # [id number]   COLONOSCOPY WITH PROPOFOL  N/A 06/28/2017   Procedure: COLONOSCOPY WITH PROPOFOL ;  Surgeon: Viktoria Lamar DASEN, MD;  Location: [facility] ENDOSCOPY;  Service: Endoscopy;  Laterality: N/A;   COLONOSCOPY WITH PROPOFOL  N/A 12/12/2021   Procedure: COLONOSCOPY WITH PROPOFOL ;  Surgeon: Maryruth Ole DASEN, MD;  Location: [facility] ENDOSCOPY;  Service: Endoscopy;  Laterality: N/A;   CORONARY ANGIOPLASTY     STENTS X 2   EYE SURGERY     KNEE ARTHROSCOPY     LEFT HEART CATH AND CORONARY ANGIOGRAPHY N/A 09/12/2020   Procedure: LEFT HEART CATH AND CORONARY ANGIOGRAPHY;  Surgeon: Ammon Blunt, MD;  Location: [facility] INVASIVE CV [facility];  Service: Cardiovascular;  Laterality: N/A;   Family History  Problem Relation Age of Onset   Heart disease Mother    Diabetes Mother    Heart disease Father    Diabetes Father    Alzheimer's disease Brother    Stroke Brother    Diabetes Sister    Diabetes Brother    Social History   Socioeconomic History   Marital status:  Married    Spouse name: Not on file   Number of children: Not on file   Years of education: Not on file   Highest education level: 12th grade  Occupational History   Not on file  Tobacco Use   Smoking status: Former    Current packs/day: 0.00    Average packs/day: 1 pack/day for 40.0 years (40.0 ttl pk-yrs)    Types: Cigarettes    Start date: 2    Quit date: 2000    Years since quitting: 25.9   Smokeless tobacco: Never   Vaping Use   Vaping status: Never Used  Substance and Sexual Activity   Alcohol use: Never   Drug use: Never   Sexual activity: Yes    Birth control/protection: None  Other Topics Concern   Not on file  Social History Narrative   Married   Social Drivers of Health   Financial Resource Strain: Low Risk  (07/03/2024)   Overall Financial Resource Strain (CARDIA)    Difficulty of Paying Living Expenses: Not hard at all  Food Insecurity: No Food Insecurity (07/03/2024)   Hunger Vital Sign    Worried About Running Out of Food in the Last Year: Never true    Ran Out of Food in the Last Year: Never true  Transportation Needs: No Transportation Needs (07/03/2024)   PRAPARE - Administrator, Civil Service (Medical): No    Lack of Transportation (Non-Medical): No  Physical Activity: Insufficiently Active (07/03/2024)   Exercise Vital Sign    Days of Exercise per Week: 3 days    Minutes of Exercise per Session: 30 min  Stress: No Stress Concern Present (07/03/2024)   [employer] of Occupational Health - Occupational Stress Questionnaire    Feeling of Stress: Not at all  Social Connections: Socially Integrated (07/03/2024)   Social Connection and Isolation Panel    Frequency of Communication with Friends and Family: More than three times a week    Frequency of Social Gatherings with Friends and Family: More than three times a week    Attends Religious Services: More than 4 times per year    Active Member of [employer] or Organizations: Yes    Attends Engineer, Structural: More than 4 times per year    Marital Status: Married     Review of Systems     Objective:     BP 110/60   Pulse 94   Temp 98 F (36.7 C) (Oral)   Ht 5' 7 (1.702 m)   Wt 164 lb (74.4 kg)   SpO2 96%   BMI 25.69 kg/m  Wt Readings from Last 3 Encounters:  07/06/24 164 lb (74.4 kg)  06/16/24 165 lb 6.4 oz (75 kg)  06/16/24 165 lb 6.4 oz (75 kg)    Physical Exam  {Perform Simple Foot  Exam  Perform Detailed exam:1} {Insert foot Exam (Optional):30965}   Outpatient Encounter Medications as of 07/06/2024  Medication Sig   albuterol  (VENTOLIN  HFA) 108 (90 Base) MCG/ACT inhaler Inhale 1 puff into the lungs every 6 (six) hours as needed for wheezing or shortness of breath.   aspirin  EC 81 MG tablet Take 81 mg by mouth daily.    calcium  carbonate (OS-CAL - DOSED IN MG OF ELEMENTAL CALCIUM ) 1250 (500 CA) MG tablet Take 1 tablet by mouth daily.    cetirizine  (ZYRTEC ) 10 MG tablet TAKE 1 TABLET BY MOUTH DAILY   CINNAMON  PO Take 1,200 mg by mouth 2 times daily at  12 noon and 4 pm.   ezetimibe  (ZETIA ) 10 MG tablet Take 1 tablet (10 mg total) by mouth daily.   famotidine  (PEPCID ) 20 MG tablet Take 1 tablet (20 mg total) by mouth 2 (two) times daily.   finasteride  (PROSCAR ) 5 MG tablet TAKE 1 TABLET BY MOUTH DAILY   fluticasone  (FLONASE ) 50 MCG/ACT nasal spray USE 2 SPRAYS IN EACH NOSTRIL ONCE DAILY AS DIRECTED   Fluticasone -Umeclidin-Vilant (TRELEGY ELLIPTA ) 100-62.5-25 MCG/ACT AEPB INHALE ONE PUFF INTO THE LUNGS EVERY DAY   GLUCOSAMINE-CHONDROITIN PO Take 1 capsule by mouth daily.   Misc Natural Products (BLACK CHERRY CONCENTRATE PO) Take 1,000 mg daily by mouth.   nitroGLYCERIN  (NITROSTAT ) 0.4 MG SL tablet Place 1 tablet (0.4 mg total) under the tongue every 5 (five) minutes as needed for chest pain.   Omega-3 Fatty Acids (FISH OIL) 1000 MG CAPS Take 1,000 mg by mouth daily.    rosuvastatin  (CRESTOR ) 20 MG tablet Take 1 tablet (20 mg total) by mouth daily.   sildenafil  (REVATIO ) 20 MG tablet TAKE 1 TO 2 TABLETS BY MOUTH EVERY DAY AS NEEDED   telmisartan  (MICARDIS ) 40 MG tablet TAKE 1 TABLET BY MOUTH ONCE DAILY   [DISCONTINUED] finasteride  (PROSCAR ) 5 MG tablet TAKE ONE TABLET EVERY DAY   No facility-administered encounter medications on file as of 07/06/2024.     Lab Results  Component Value Date   WBC 9.1 07/02/2024   HGB 14.5 07/02/2024   HCT 42.4 07/02/2024   PLT 374.0  07/02/2024   GLUCOSE 123 (H) 07/02/2024   CHOL 98 07/02/2024   TRIG 61.0 07/02/2024   HDL 41.60 07/02/2024   LDLCALC 44 07/02/2024   ALT 19 07/02/2024   AST 16 07/02/2024   NA 131 (L) 07/02/2024   K 4.4 07/02/2024   CL 99 07/02/2024   CREATININE 1.01 07/02/2024   BUN 14 07/02/2024   CO2 25 07/02/2024   TSH 3.05 02/21/2024   PSA 0.46 07/02/2024   INR 1.0 09/11/2020   HGBA1C 6.5 07/02/2024   MICROALBUR 1.2 07/02/2024    NM Myocar Multi W/Spect W/Wall Motion / EF Result Date: 04/06/2024   Findings are consistent with infarction. The study is intermediate risk.   No ST deviation was noted. The ECG was negative for ischemia.   LV perfusion is abnormal. There is no evidence of ischemia. There is evidence of infarction. Defect 1: There is a medium defect with moderate reduction in uptake present in the apical to mid inferior and inferolateral location(s) that is fixed. There is abnormal wall motion in the defect area. Consistent with infarction. The defect is consistent with abnormal perfusion in the LCx territory.   Left ventricular function is normal. End diastolic cavity size is normal. End systolic cavity size is normal.       Assessment & Plan:  Healthcare maintenance  Diabetes mellitus type 2 with complications (HCC)  Hyponatremia  Mixed hyperlipidemia     Allena Hamilton, MD

## 2024-07-07 ENCOUNTER — Ambulatory Visit: Payer: Self-pay | Admitting: Internal Medicine

## 2024-07-12 ENCOUNTER — Encounter: Payer: Self-pay | Admitting: Internal Medicine

## 2024-07-12 DIAGNOSIS — H539 Unspecified visual disturbance: Secondary | ICD-10-CM | POA: Insufficient documentation

## 2024-07-12 NOTE — Assessment & Plan Note (Signed)
 He saw ophthalmology - reported wavy vision - just peripheral vision. Tunnel vision - normal. No associated headache. Saw ophthalmology. Eye exam ok. Recommended MRI.

## 2024-07-12 NOTE — Assessment & Plan Note (Signed)
 Saw Dr Gollan 03/17/24 - f/u CAD and recent episode of chest pain. Had echo 05/2024 - normal left and right ventricular size and function. No significant valvular heart disease - ok. Stress test - small fixed defect in the basal to mid lateral wall c/w prior infarct/MI. Recommended to treat medically.

## 2024-07-12 NOTE — Assessment & Plan Note (Signed)
 Physical today 07/06/24..  Colonoscopy 11/2021.  No f/u recommended.  Check psa today.

## 2024-07-12 NOTE — Assessment & Plan Note (Signed)
 Had f/u with Dr Tamea 06/16/24 - f/u asthma/COPD and bronchiectasis. PFTs stable. Was given rx for prednisone . Recommended to continue trelegy and albuterol .

## 2024-07-12 NOTE — Assessment & Plan Note (Signed)
 Low carb diet and exercise.  Follow met b and A1c. On no medication.  Lab Results  Component Value Date   HGBA1C 6.5 07/02/2024

## 2024-07-12 NOTE — Assessment & Plan Note (Signed)
 On crestor  and zetia .  Low cholesterol diet and exercise.  Follow lipid panel and liver function tests. No change in medication today.  Lab Results  Component Value Date   CHOL 98 07/02/2024   HDL 41.60 07/02/2024   LDLCALC 44 07/02/2024   TRIG 61.0 07/02/2024   CHOLHDL 2 07/02/2024

## 2024-07-12 NOTE — Assessment & Plan Note (Signed)
Colonoscopy 12/12/21:   RECTAL POLYP; HOT SNARE:  - TUBULAR ADENOMA WITH MUCOSAL PROLAPSE TYPE CHANGES.  - CAUTERIZED POLYP BASE FREE OF DYSPLASIA.  - NEGATIVE FOR HIGH-GRADE DYSPLASIA AND MALIGNANCY.   COLON POLYPS X2, ASCENDING; COLD BIOPSY AND COLD SNARE:  - MULTIPLE FRAGMENTS OF TUBULAR ADENOMAS.  - NEGATIVE FOR HIGH-GRADE DYSPLASIA AND MALIGNANCY.

## 2024-07-12 NOTE — Assessment & Plan Note (Signed)
 Evaluated by Dr Twylla - 09/2021 - recommended CT urogram and cystoscopy.  Cysto (10/30/21) - No bladder mucosal abnormalities. Hematuria most likely secondary to BPH. Started finasteride  5 mg daily. Follow-up 1 year or earlier for recurrent hematuria. Had f/u with urology - 10/2022 - off plavix .  No recurring episodes of hematuria.  No changes made.  Recommended f/u in one year.  Had f/u with urology 11/14/23 - f/u hematuria. No further w/up warranted.

## 2024-07-12 NOTE — Assessment & Plan Note (Signed)
 Persistent cough and congestion as outlined. Will check cxr. Treat with omnicef  and prednisone  taper as directected. Saline/steroid nasal spray. Follow. Call with update.

## 2024-07-12 NOTE — Assessment & Plan Note (Signed)
 Had f/u with Dr Tamea 06/16/24 - f/u asthma/COPD and bronchiectasis. PFTs stable. Was given rx for prednisone . Recommended to continue trelegy and albuterol . Persistent increased cough and congestion as outlined. Treat current infection with omnicef  and prednisone  as directed. Continue trelegy. Has albuterol  to use if needed.

## 2024-07-12 NOTE — Assessment & Plan Note (Signed)
 [facility] 01/24/24 - Duplex ultrasound shows <40% stenosis bilaterally.. Continue risk factor modification. Recommended f/u in 24 months.

## 2024-07-12 NOTE — Assessment & Plan Note (Signed)
 Sodium decreased on recent check. Recheck sodium to confirm wnl.

## 2024-07-12 NOTE — Assessment & Plan Note (Signed)
 Followed by [facility].

## 2024-07-12 NOTE — Assessment & Plan Note (Signed)
 Low carb diet and exercise. Follow met b and a1c.   Lab Results  Component Value Date   HGBA1C 6.5 07/02/2024

## 2024-07-12 NOTE — Assessment & Plan Note (Signed)
 Continue micardis  40mg  q day.  Follow pressures.  Follow metabolic panel. No change in medication today.

## 2024-07-14 ENCOUNTER — Telehealth: Payer: Self-pay | Admitting: Internal Medicine

## 2024-07-14 NOTE — Telephone Encounter (Signed)
 Please call and let James Peck know that it is ok for him to come in for his b12 shot. Wife had asked given he had been on steroids.

## 2024-07-14 NOTE — Telephone Encounter (Signed)
 Pt.notified

## 2024-07-15 ENCOUNTER — Ambulatory Visit

## 2024-07-15 DIAGNOSIS — E538 Deficiency of other specified B group vitamins: Secondary | ICD-10-CM

## 2024-07-15 MED ORDER — CYANOCOBALAMIN 1000 MCG/ML IJ SOLN
1000.0000 ug | Freq: Once | INTRAMUSCULAR | Status: AC
  Administered 2024-07-15: 10:00:00 1000 ug via INTRAMUSCULAR

## 2024-07-15 NOTE — Progress Notes (Signed)
 Patient was administered a B12 injection into his left deltoid. Patient tolerated the B12 injection well.

## 2024-07-24 ENCOUNTER — Telehealth: Payer: Self-pay

## 2024-07-24 NOTE — Telephone Encounter (Signed)
 Copied from CRM [id number]. Topic: Clinical - Medication Question >> Jul 24, 2024  8:05 AM Carlyon D wrote: Reason for CRM: Pt tested positive influenza A pt is asking  can he get some  tamiflu sent over to the pharmacy   Preferred pharmacy:  [facility] - [place], [place] - [address] [place] [address] Phone: [phone] Fax: [phone]

## 2024-07-27 ENCOUNTER — Ambulatory Visit: Payer: PPO | Admitting: *Deleted

## 2024-07-27 VITALS — BP 129/64 | HR 69 | Ht 67.0 in | Wt 154.2 lb

## 2024-07-27 DIAGNOSIS — Z0000 Encounter for general adult medical examination without abnormal findings: Secondary | ICD-10-CM

## 2024-07-27 NOTE — Telephone Encounter (Signed)
 Per message, they are aware I have been out of the office. Als reviewed message on his wife James Peck. Both with similar symptoms. Please call and confirm they were evaluated and are doing ok.

## 2024-07-27 NOTE — Telephone Encounter (Signed)
 Pt tried to go the Urgent Care and was told it was a 4 hour wait. And that they could book online. But there was nothing available the next day so they decided to ride it out. There sx's are much better now with mainly fatigue.  Pt aware to let us  know if sx's do not completely resolve in a few days.

## 2024-07-27 NOTE — Progress Notes (Signed)
 "  Chief Complaint  Patient presents with   Medicare Wellness     Subjective:   James Peck is a 83 y.o. male who presents for a Medicare Annual Wellness Visit.  Visit info / Clinical Intake: Medicare Wellness Visit Type:: Subsequent Annual Wellness Visit Persons participating in visit and providing information:: patient Medicare Wellness Visit Mode:: Telephone If telephone:: video declined Since this visit was completed virtually, some vitals may be partially provided or unavailable. Missing vitals are due to the limitations of the virtual format.: Documented vitals are patient reported If Telephone or Video please confirm:: I connected with patient using audio/video enable telemedicine. I verified patient identity with two identifiers, discussed telehealth limitations, and patient agreed to proceed. Patient Location:: Home Provider Location:: Office/Home Interpreter Needed?: No Pre-visit prep was completed: yes AWV questionnaire completed by patient prior to visit?: yes Date:: 07/24/24 Living arrangements:: (Patient-Rptd) lives with spouse/significant other Patient's Overall Health Status Rating: (Patient-Rptd) good Typical amount of pain: some (knee pain off and on for years) Does pain affect daily life?: (Patient-Rptd) no Are you currently prescribed opioids?: no  Dietary Habits and Nutritional Risks How many meals a day?: (Patient-Rptd) 3 Eats fruit and vegetables daily?: (Patient-Rptd) yes Most meals are obtained by: (Patient-Rptd) preparing own meals In the last 2 weeks, have you had any of the following?: none Diabetic:: (!) yes Any non-healing wounds?: no How often do you check your BS?: 0 Would you like to be referred to a Nutritionist or for Diabetic Management? : no  Functional Status Activities of Daily Living (to include ambulation/medication): (Patient-Rptd) Independent Ambulation: (Patient-Rptd) Independent Medication Administration: (Patient-Rptd)  Independent Home Management (perform basic housework or laundry): (Patient-Rptd) Independent Manage your own finances?: (Patient-Rptd) yes Primary transportation is: (Patient-Rptd) driving Concerns about vision?: no *vision screening is required for WTM* Concerns about hearing?: no  Fall Screening Falls in the past year?: (Patient-Rptd) 0 Number of falls in past year: 0 Was there an injury with Fall?: 0 Fall Risk Category Calculator: 0 Patient Fall Risk Level: Low Fall Risk  Fall Risk Patient at Risk for Falls Due to: No Fall Risks Fall risk Follow up: Falls evaluation completed; Falls prevention discussed  Home and Transportation Safety: All rugs have non-skid backing?: (!) no (discussed safety) All stairs or steps have railings?: (Patient-Rptd) yes Grab bars in the bathtub or shower?: (Patient-Rptd) yes Have non-skid surface in bathtub or shower?: (Patient-Rptd) yes Good home lighting?: (Patient-Rptd) yes Regular seat belt use?: (Patient-Rptd) yes Hospital stays in the last year:: (Patient-Rptd) no  Cognitive Assessment Difficulty concentrating, remembering, or making decisions? : (Patient-Rptd) no Will 6CIT or Mini Cog be Completed: yes What year is it?: 0 points What month is it?: 0 points Give patient an address phrase to remember (5 components): [address] [place] About what time is it?: 0 points Count backwards from 20 to 1: 0 points Say the months of the year in reverse: 0 points Repeat the address phrase from earlier: 0 points 6 CIT Score: 0 points  Advance Directives (For Healthcare) Does Patient Have a Medical Advance Directive?: Yes Does patient want to make changes to medical advance directive?: No - Patient declined Type of Advance Directive: Healthcare Power of [place]; Living will Copy of Healthcare Power of Attorney in Chart?: Yes - validated most recent copy scanned in chart (See row information) Copy of Living Will in Chart?: Yes - validated  most recent copy scanned in chart (See row information)  Reviewed/Updated  Reviewed/Updated: Reviewed All (Medical,  Surgical, Family, Medications, Allergies, Care Teams, Patient Goals)    Allergies (verified) Ace inhibitors and Enalapril   Current Medications (verified) Outpatient Encounter Medications as of 07/27/2024  Medication Sig   albuterol  (VENTOLIN  HFA) 108 (90 Base) MCG/ACT inhaler Inhale 1 puff into the lungs every 6 (six) hours as needed for wheezing or shortness of breath.   aspirin  EC 81 MG tablet Take 81 mg by mouth daily.    calcium  carbonate (OS-CAL - DOSED IN MG OF ELEMENTAL CALCIUM ) 1250 (500 CA) MG tablet Take 1 tablet by mouth daily.    cetirizine  (ZYRTEC ) 10 MG tablet TAKE 1 TABLET BY MOUTH DAILY   CINNAMON  PO Take 1,200 mg by mouth 2 times daily at 12 noon and 4 pm.   ezetimibe  (ZETIA ) 10 MG tablet Take 1 tablet (10 mg total) by mouth daily.   famotidine  (PEPCID ) 20 MG tablet Take 1 tablet (20 mg total) by mouth 2 (two) times daily.   finasteride  (PROSCAR ) 5 MG tablet TAKE 1 TABLET BY MOUTH DAILY   fluticasone  (FLONASE ) 50 MCG/ACT nasal spray USE 2 SPRAYS IN EACH NOSTRIL ONCE DAILY AS DIRECTED   Fluticasone -Umeclidin-Vilant (TRELEGY ELLIPTA ) 100-62.5-25 MCG/ACT AEPB Inhale 1 puff into the lungs daily.   GLUCOSAMINE-CHONDROITIN PO Take 1 capsule by mouth daily.   Misc Natural Products (BLACK CHERRY CONCENTRATE PO) Take 1,000 mg daily by mouth.   nitroGLYCERIN  (NITROSTAT ) 0.4 MG SL tablet Place 1 tablet (0.4 mg total) under the tongue every 5 (five) minutes as needed for chest pain.   Omega-3 Fatty Acids (FISH OIL) 1000 MG CAPS Take 1,000 mg by mouth daily.    rosuvastatin  (CRESTOR ) 20 MG tablet Take 1 tablet (20 mg total) by mouth daily.   sildenafil  (REVATIO ) 20 MG tablet TAKE 1 TO 2 TABLETS BY MOUTH EVERY DAY AS NEEDED   telmisartan  (MICARDIS ) 40 MG tablet TAKE 1 TABLET BY MOUTH ONCE DAILY   predniSONE  (DELTASONE ) 10 MG tablet Take 6 tablets x 1 day and then  decrease by 1/2 tablet per day until down to zero mg. (Patient not taking: Reported on 07/27/2024)   [DISCONTINUED] cefdinir  (OMNICEF ) 300 MG capsule Take 1 capsule (300 mg total) by mouth 2 (two) times daily. (Patient not taking: Reported on 07/27/2024)   No facility-administered encounter medications on file as of 07/27/2024.    History: Past Medical History:  Diagnosis Date   Allergy    Arthritis    COPD (chronic obstructive pulmonary disease) (HCC)    Coronary artery disease    GERD (gastroesophageal reflux disease)    History of hiatal hernia    Hypercholesteremia    Hypertension    Past Surgical History:  Procedure Laterality Date   CARDIAC CATHETERIZATION     cardiac stents     CATARACT EXTRACTION W/PHACO Right 09/10/2017   Procedure: CATARACT EXTRACTION PHACO AND INTRAOCULAR LENS PLACEMENT (IOC);  Surgeon: Jaye Fallow, MD;  Location: [facility] ORS;  Service: Ophthalmology;  Laterality: Right;  US  01:01.7 AP% 15.3 CDE 9.38 Fluid pack lot # [id number]   CATARACT EXTRACTION W/PHACO Left 10/02/2017   Procedure: CATARACT EXTRACTION PHACO AND INTRAOCULAR LENS PLACEMENT (IOC);  Surgeon: Jaye Fallow, MD;  Location: [facility] ORS;  Service: Ophthalmology;  Laterality: Left;  US  00:42.3 AP% 16.2 CDE 6.85 Fluid Pack Lot # [id number]   COLONOSCOPY WITH PROPOFOL  N/A 06/28/2017   Procedure: COLONOSCOPY WITH PROPOFOL ;  Surgeon: Viktoria Lamar DASEN, MD;  Location: [facility] ENDOSCOPY;  Service: Endoscopy;  Laterality: N/A;   COLONOSCOPY WITH PROPOFOL  N/A 12/12/2021   Procedure:  COLONOSCOPY WITH PROPOFOL ;  Surgeon: Maryruth Ole DASEN, MD;  Location: [facility] ENDOSCOPY;  Service: Endoscopy;  Laterality: N/A;   CORONARY ANGIOPLASTY     STENTS X 2   EYE SURGERY     KNEE ARTHROSCOPY     LEFT HEART CATH AND CORONARY ANGIOGRAPHY N/A 09/12/2020   Procedure: LEFT HEART CATH AND CORONARY ANGIOGRAPHY;  Surgeon: Ammon Blunt, MD;  Location: [facility] INVASIVE CV LAB;  Service: Cardiovascular;  Laterality:  N/A;   Family History  Problem Relation Age of Onset   Heart disease Mother    Diabetes Mother    Heart disease Father    Diabetes Father    Alzheimer's disease Brother    Stroke Brother    Diabetes Sister    Diabetes Brother    Social History   Occupational History   Not on file  Tobacco Use   Smoking status: Former    Current packs/day: 0.00    Average packs/day: 1 pack/day for 40.0 years (40.0 ttl pk-yrs)    Types: Cigarettes    Start date: 17    Quit date: 2000    Years since quitting: 26.0   Smokeless tobacco: Never  Vaping Use   Vaping status: Never Used  Substance and Sexual Activity   Alcohol use: Never   Drug use: Never   Sexual activity: Yes    Birth control/protection: None   Tobacco Counseling Counseling given: Not Answered  SDOH Screenings   Food Insecurity: No Food Insecurity (07/27/2024)  Housing: Low Risk (07/27/2024)  Transportation Needs: No Transportation Needs (07/27/2024)  Utilities: Not At Risk (07/27/2024)  Alcohol Screen: Low Risk (07/27/2024)  Depression (PHQ2-9): Low Risk (07/27/2024)  Financial Resource Strain: Low Risk (07/27/2024)  Physical Activity: Insufficiently Active (07/27/2024)  Social Connections: Socially Integrated (07/27/2024)  Stress: No Stress Concern Present (07/27/2024)  Tobacco Use: Medium Risk (07/27/2024)  Health Literacy: Adequate Health Literacy (07/27/2024)   See flowsheets for full screening details  Depression Screen PHQ 2 & 9 Depression Scale- Over the past 2 weeks, how often have you been bothered by any of the following problems? Little interest or pleasure in doing things: 0 Feeling down, depressed, or hopeless (PHQ Adolescent also includes...irritable): 0 PHQ-2 Total Score: 0 Trouble falling or staying asleep, or sleeping too much: 1 (because of being on Prednisone ) Feeling tired or having little energy: 2 (getting over the flu) Poor appetite or overeating (PHQ Adolescent also includes...weight  loss): 0 Feeling bad about yourself - or that you are a failure or have let yourself or your family down: 0 Trouble concentrating on things, such as reading the newspaper or watching television (PHQ Adolescent also includes...like school work): 0 Moving or speaking so slowly that other people could have noticed. Or the opposite - being so fidgety or restless that you have been moving around a lot more than usual: 0 PHQ-9 Total Score: 3 If you checked off any problems, how difficult have these problems made it for you to do your work, take care of things at home, or get along with other people?: Not difficult at all     Goals Addressed             This Visit's Progress    Patient Stated       Wants to continue to stay active and work              Objective:    Today's Vitals   07/27/24 0850 07/27/24 0927  BP: (!) 130/59 129/64  Pulse: 69  SpO2: 95%   Weight: 154 lb 4 oz (70 kg)   Height: 5' 7 (1.702 m)    Body mass index is 24.16 kg/m.  Hearing/Vision screen Hearing Screening - Comments:: No issues Vision Screening - Comments:: Glasses, [facility], Up to date Immunizations and Health Maintenance Health Maintenance  Topic Date Due   Zoster Vaccines- Shingrix (1 of 2) Never done   COVID-19 Vaccine (3 - 2025-26 season) 03/30/2024   OPHTHALMOLOGY EXAM  06/12/2024   DTaP/Tdap/Td (2 - Td or Tdap) 10/22/2024 (Originally 03/12/2023)   Pneumococcal Vaccine: 50+ Years (1 of 2 - PCV) 10/22/2024 (Originally 09/11/1959)   HEMOGLOBIN A1C  12/31/2024   Diabetic kidney evaluation - eGFR measurement  07/02/2025   Diabetic kidney evaluation - Urine ACR  07/06/2025   FOOT EXAM  07/06/2025   Medicare Annual Wellness (AWV)  07/27/2025   Influenza Vaccine  Completed   Meningococcal B Vaccine  Aged Out        Assessment/Plan:  This is a routine wellness examination for James Peck.  Patient Care Team: Glendia Shad, MD as PCP - General (Internal Medicine) Tamea Dedra CROME, MD as  Consulting Physician (Pulmonary Disease) Perla Evalene PARAS, MD as Consulting Physician (Cardiology)  I have personally reviewed and noted the following in the patients chart:   Medical and social history Use of alcohol, tobacco or illicit drugs  Current medications and supplements including opioid prescriptions. Functional ability and status Nutritional status Physical activity Advanced directives List of other physicians Hospitalizations, surgeries, and ER visits in previous 12 months Vitals Screenings to include cognitive, depression, and falls Referrals and appointments  No orders of the defined types were placed in this encounter.  In addition, I have reviewed and discussed with patient certain preventive protocols, quality metrics, and best practice recommendations. A written personalized care plan for preventive services as well as general preventive health recommendations were provided to patient.   Angeline Fredericks, LPN   [id number]   Return in 1 year (on 07/27/2025).  After Visit Summary: (MyChart) Due to this being a telephonic visit, the after visit summary with patients personalized plan was offered to patient via MyChart   Nurse Notes: Discussed the need to update tetanus and shingles vaccines. Patient declines covid vaccine. Will send for last eye exam office notes. Patient stated that he is getting over the flu. Patient declines an office visit because he feels so much better today.  "

## 2024-07-27 NOTE — Patient Instructions (Signed)
 James Peck,  Thank you for taking the time for your Medicare Wellness Visit. I appreciate your continued commitment to your health goals. Please review the care plan we discussed, and feel free to reach out if I can assist you further.  Please note that Annual Wellness Visits do not include a physical exam. Some assessments may be limited, especially if the visit was conducted virtually. If needed, we may recommend an in-person follow-up with your provider.  Ongoing Care Seeing your primary care provider every 3 to 6 months helps us  monitor your health and provide consistent, personalized care.  Remember to update your tetanus and shingles vaccines.   Referrals If a referral was made during today's visit and you haven't received any updates within two weeks, please contact the referred provider directly to check on the status.  Recommended Screenings:  Health Maintenance  Topic Date Due   Zoster (Shingles) Vaccine (1 of 2) Never done   COVID-19 Vaccine (3 - 2025-26 season) 03/30/2024   Eye exam for diabetics  06/12/2024   DTaP/Tdap/Td vaccine (2 - Td or Tdap) 10/22/2024*   Pneumococcal Vaccine for age over 55 (1 of 2 - PCV) 10/22/2024*   Hemoglobin A1C  12/31/2024   Yearly kidney function blood test for diabetes  07/02/2025   Yearly kidney health urinalysis for diabetes  07/06/2025   Complete foot exam   07/06/2025   Medicare Annual Wellness Visit  07/27/2025   Flu Shot  Completed   Meningitis B Vaccine  Aged Out  *Topic was postponed. The date shown is not the original due date.       07/24/2024    4:07 PM  Advanced Directives  Does Patient Have a Medical Advance Directive? Yes  Type of Estate Agent of [place];Living will  Does patient want to make changes to medical advance directive? No - Patient declined  Copy of Healthcare Power of Attorney in Chart? Yes - validated most recent copy scanned in chart (See row information)    Vision: Annual vision  screenings are recommended for early detection of glaucoma, cataracts, and diabetic retinopathy. These exams can also reveal signs of chronic conditions such as diabetes and high blood pressure.  Dental: Annual dental screenings help detect early signs of oral cancer, gum disease, and other conditions linked to overall health, including heart disease and diabetes.  Please see the attached documents for additional preventive care recommendations.

## 2024-08-08 ENCOUNTER — Ambulatory Visit
Admission: RE | Admit: 2024-08-08 | Discharge: 2024-08-08 | Disposition: A | Source: Ambulatory Visit | Attending: Internal Medicine | Admitting: Internal Medicine

## 2024-08-08 DIAGNOSIS — H539 Unspecified visual disturbance: Secondary | ICD-10-CM

## 2024-08-10 NOTE — Telephone Encounter (Signed)
 Copied from CRM [id number]. Topic: Clinical - Medication Question >> Aug 10, 2024 10:52 AM Eva FALCON wrote: Reason for CRM: Pt wife Gaetana called in and states pt was scheduled to due MRI this weekend but was unable to do it due to being claustrophobic. They requested for him to get a medication prescribed to help him relax to be able to do the MRI. If possible please sent to pharmacy on file, states they will have to bring the medication to the appointment when the reschedule it. Call back number [phone] or pts cell [phone].

## 2024-08-11 MED ORDER — LORAZEPAM 0.5 MG PO TABS: ORAL_TABLET | ORAL | 0 refills | Status: AC

## 2024-08-11 NOTE — Addendum Note (Signed)
 Addended by: GLENDIA ALLENA RAMAN on: 08/11/2024 03:08 AM   Modules accepted: Orders

## 2024-08-11 NOTE — Telephone Encounter (Signed)
 I have sent in lorazepam . He is to take this medication with him to his scan. He will take 1/2 tablet prior to his procedure. He needs someone to drive him home.

## 2024-08-11 NOTE — Telephone Encounter (Signed)
 Pt.notified

## 2024-08-13 ENCOUNTER — Ambulatory Visit
Admission: RE | Admit: 2024-08-13 | Discharge: 2024-08-13 | Disposition: A | Discharge status: Home / Self Care | Source: Ambulatory Visit | Attending: Internal Medicine | Admitting: Internal Medicine

## 2024-08-13 DIAGNOSIS — H539 Unspecified visual disturbance: Secondary | ICD-10-CM | POA: Insufficient documentation

## 2024-08-17 ENCOUNTER — Ambulatory Visit (INDEPENDENT_AMBULATORY_CARE_PROVIDER_SITE_OTHER)

## 2024-08-17 DIAGNOSIS — E538 Deficiency of other specified B group vitamins: Secondary | ICD-10-CM | POA: Diagnosis not present

## 2024-08-17 MED ORDER — CYANOCOBALAMIN 1000 MCG/ML IJ SOLN
1000.0000 ug | Freq: Once | INTRAMUSCULAR | Status: AC
  Administered 2024-08-17: 1000 ug via INTRAMUSCULAR

## 2024-08-17 NOTE — Progress Notes (Signed)
 Pt presented for their vitamin B12 injection. Pt was identified through two identifiers. Pt tolerated shot well in their right deltoid.

## 2024-08-24 NOTE — Progress Notes (Unsigned)
 "                         MRN : [id number]  James Peck is a 84 y.o. (04-11-1941) male who presents with chief complaint of check carotid arteries.  History of Present Illness:   The patient is seen for follow up evaluation of multiple episodes of visual changes.  He does have a history of mild carotid stenosis followed by ultrasound.  This led to obtaining an MRI of the brain which as noted below demonstrated a flow abnormality in the distal left vertebral artery.   The patient denies amaurosis fugax. There is no recent history of TIA symptoms or focal motor deficits. There is no prior documented CVA.   The patient is taking enteric-coated aspirin  81 mg daily.   There is no history of migraine headaches. There is no history of seizures.   No recent shortening of the patient's walking distance or new symptoms consistent with claudication.  No history of rest pain symptoms. No new ulcers or wounds of the lower extremities have occurred.   There is no history of DVT, PE or superficial thrombophlebitis. No documented recent episodes of angina or shortness of breath documented.    Carotid Duplex done today shows <40%.  No change compared to last study in 2023   Vertebral arteries were well-visualized today with no evidence of stenosis and normal flow hemodynamics in the bilateral subclavian arteries.  Recent MRI of the brain without contrast dated 08/13/2024 is reviewed by me and demonstrates chronic small infarcts but no other acute abnormalities.  There is a flow void noted in the distal left vertebral artery.  Active Medications[1]  Past Medical History:  Diagnosis Date   Allergy    Arthritis    COPD (chronic obstructive pulmonary disease) (HCC)    Coronary artery disease    GERD (gastroesophageal reflux disease)    History of hiatal hernia    Hypercholesteremia    Hypertension     Past Surgical History:  Procedure Laterality Date   CARDIAC CATHETERIZATION     cardiac stents      CATARACT EXTRACTION W/PHACO Right 09/10/2017   Procedure: CATARACT EXTRACTION PHACO AND INTRAOCULAR LENS PLACEMENT (IOC);  Surgeon: Jaye Fallow, MD;  Location: [facility] ORS;  Service: Ophthalmology;  Laterality: Right;  US  01:01.7 AP% 15.3 CDE 9.38 Fluid pack lot # [id number]   CATARACT EXTRACTION W/PHACO Left 10/02/2017   Procedure: CATARACT EXTRACTION PHACO AND INTRAOCULAR LENS PLACEMENT (IOC);  Surgeon: Jaye Fallow, MD;  Location: [facility] ORS;  Service: Ophthalmology;  Laterality: Left;  US  00:42.3 AP% 16.2 CDE 6.85 Fluid Pack Lot # [id number]   COLONOSCOPY WITH PROPOFOL  N/A 06/28/2017   Procedure: COLONOSCOPY WITH PROPOFOL ;  Surgeon: Viktoria Lamar DASEN, MD;  Location: [facility] ENDOSCOPY;  Service: Endoscopy;  Laterality: N/A;   COLONOSCOPY WITH PROPOFOL  N/A 12/12/2021   Procedure: COLONOSCOPY WITH PROPOFOL ;  Surgeon: Maryruth Ole DASEN, MD;  Location: [facility] ENDOSCOPY;  Service: Endoscopy;  Laterality: N/A;   CORONARY ANGIOPLASTY     STENTS X 2   EYE SURGERY     KNEE ARTHROSCOPY     LEFT HEART CATH AND CORONARY ANGIOGRAPHY N/A 09/12/2020   Procedure: LEFT HEART CATH AND CORONARY ANGIOGRAPHY;  Surgeon: Ammon Blunt, MD;  Location: [facility] INVASIVE CV LAB;  Service: Cardiovascular;  Laterality: N/A;    Social History Social History[2]  Family History Family History  Problem Relation Age of Onset   Heart disease Mother  Diabetes Mother    Heart disease Father    Diabetes Father    Alzheimer's disease Brother    Stroke Brother    Diabetes Sister    Diabetes Brother     Allergies[3]   REVIEW OF SYSTEMS (Negative unless checked)  Constitutional: [] Weight loss  [] Fever  [] Chills Cardiac: [] Chest pain   [] Chest pressure   [] Palpitations   [] Shortness of breath when laying flat   [] Shortness of breath with exertion. Vascular:  [x] Pain in legs with walking   [] Pain in legs at rest  [] History of DVT   [] Phlebitis   [] Swelling in legs   [] Varicose veins   [] Non-healing  ulcers Pulmonary:   [] Uses home oxygen   [] Productive cough   [] Hemoptysis   [] Wheeze  [] COPD   [] Asthma Neurologic:  [] Dizziness   [] Seizures   [] History of stroke   [] History of TIA  [] Aphasia   [] Vissual changes   [] Weakness or numbness in arm   [] Weakness or numbness in leg Musculoskeletal:   [] Joint swelling   [] Joint pain   [] Low back pain Hematologic:  [] Easy bruising  [] Easy bleeding   [] Hypercoagulable state   [] Anemic Gastrointestinal:  [] Diarrhea   [] Vomiting  [] Gastroesophageal reflux/heartburn   [] Difficulty swallowing. Genitourinary:  [] Chronic kidney disease   [] Difficult urination  [] Frequent urination   [] Blood in urine Skin:  [] Rashes   [] Ulcers  Psychological:  [] History of anxiety   []  History of major depression.  Physical Examination  There were no vitals filed for this visit. There is no height or weight on file to calculate BMI. Gen: WD/WN, NAD Head: [place]/AT, No temporalis wasting.  Ear/Nose/Throat: Hearing grossly intact, nares w/o erythema or drainage Eyes: PER, EOMI, sclera nonicteric.  Neck: Supple, no masses.  No bruit or JVD.  Pulmonary:  Good air movement, no audible wheezing, no use of accessory muscles.  Cardiac: RRR, normal S1, S2, no Murmurs. Vascular:   No carotid bruit noted Vessel Right Left  Radial Palpable Palpable  Carotid  Palpable  Palpable  Subclav  Palpable Palpable  Gastrointestinal: soft, non-distended. No guarding/no peritoneal signs.  Musculoskeletal: M/S 5/5 throughout.  No visible deformity.  Neurologic: CN 2-12 intact. Pain and light touch intact in extremities.  Symmetrical.  Speech is fluent. Motor exam as listed above. Psychiatric: Judgment intact, Mood & affect appropriate for pt's clinical situation. Dermatologic: No rashes or ulcers noted.  No changes consistent with cellulitis.   CBC Lab Results  Component Value Date   WBC 9.1 07/02/2024   HGB 14.5 07/02/2024   HCT 42.4 07/02/2024   MCV 90.7 07/02/2024   PLT 374.0  07/02/2024    BMET    Component Value Date/Time   NA 135 07/06/2024 1010   NA 137 11/06/2012 0034   K 4.4 07/02/2024 0733   K 3.8 11/06/2012 0034   CL 99 07/02/2024 0733   CL 104 11/06/2012 0034   CO2 25 07/02/2024 0733   CO2 26 11/06/2012 0034   GLUCOSE 123 (H) 07/02/2024 0733   GLUCOSE 127 (H) 11/06/2012 0034   BUN 14 07/02/2024 0733   BUN 12 11/06/2012 0034   CREATININE 1.01 07/02/2024 0733   CREATININE 0.96 11/06/2012 0034   CALCIUM  9.0 07/02/2024 0733   CALCIUM  8.3 (L) 11/06/2012 0034   GFRNONAA >60 09/12/2020 2355   GFRNONAA >60 11/06/2012 0034   GFRAA >60 03/06/2020 1005   GFRAA >60 11/06/2012 0034   CrCl cannot be calculated (Patient's most recent lab result is older than the maximum 21  days allowed.).  COAG Lab Results  Component Value Date   INR 1.0 09/11/2020    Radiology MR Brain Wo Contrast Result Date: 08/18/2024 EXAM: MRI BRAIN WITHOUT CONTRAST 08/13/2024 09:55:09 AM TECHNIQUE: Multiplanar multisequence MRI of the head/brain was performed without the administration of intravenous contrast. COMPARISON: None available. CLINICAL HISTORY: Visual disturbance FINDINGS: BRAIN AND VENTRICLES: There is no evidence of an acute infarct, intracranial hemorrhage, mass, midline shift, hydrocephalus, or extra-axial fluid collection. Chronic lacunar infarcts are noted in the right basal ganglia. Mild generalized cerebral atrophy is within normal limits for age. Absent flow void in the distal left vertebral artery. ORBITS: Bilateral cataract extraction. SINUSES AND MASTOIDS: Moderate mucosal thickening in the maxillary sinuses. Clear mastoid air cells. BONES AND SOFT TISSUES: Small focus of susceptibility artifact in the right frontal scalp, of indeterminate etiology. IMPRESSION: 1. No acute intracranial abnormality. 2. Chronic lacunar infarcts in the right basal ganglia. 3. Absent flow void in the distal left vertebral artery which may reflect occlusion or slow flow.  Electronically signed by: Dasie Hamburg MD 08/18/2024 08:22 PM EST RP Workstation: [id number]     Assessment/Plan 1. Bilateral carotid artery disease, unspecified type (Primary) Recommend:  The patient is possible symptomatic with respect to the vertebral artery stenosis.  Additionally, the patient has an MRI that suggests a distal left vertebral lesion that is >70%.  Patient should undergo CT angiography of the cervical and cerebral arteries to define the degree of stenosis of the left vertebral artery and define whether further intervention would be indicated or medical management.  Continue antiplatelet therapy as prescribed. Continue management of CAD, HTN and Hyperlipidemia. Healthy heart diet, encouraged exercise at least 4 times per week.  - CT ANGIO HEAD W OR WO CONTRAST; Future - CT ANGIO NECK W OR WO CONTRAST; Future  2. Occlusion and stenosis of left vertebral artery Recommend:  The patient is possible symptomatic with respect to the vertebral artery stenosis.  Additionally, the patient has an MRI that suggests a distal left vertebral lesion that is >70%.  Patient should undergo CT angiography of the cervical and cerebral arteries to define the degree of stenosis of the left vertebral artery and define whether further intervention would be indicated or medical management.  Continue antiplatelet therapy as prescribed. Continue management of CAD, HTN and Hyperlipidemia. Healthy heart diet, encouraged exercise at least 4 times per week. - CT ANGIO HEAD W OR WO CONTRAST; Future - CT ANGIO NECK W OR WO CONTRAST; Future  3. Subclavian artery stenosis, left Normal flow hemodynamics in the bilateral subclavian arteries with relatively normal blood pressures bilaterally. No role for intervention currently.   4. Coronary artery disease involving native coronary artery of native heart without angina pectoris Continue cardiac and antihypertensive medications as already ordered and  reviewed, no changes at this time.  Continue statin as ordered and reviewed, no changes at this time  Nitrates PRN for chest pain  5. Benign essential HTN Continue antihypertensive medications as already ordered, these medications have been reviewed and there are no changes at this time.  6. Emphysema, unspecified (HCC) Continue pulmonary medications and aerosols as already ordered, these medications have been reviewed and there are no changes at this time.     Cordella Shawl, MD  08/24/2024 2:28 PM      [1]  No outpatient medications have been marked as taking for the 08/27/24 encounter (Appointment) with Shawl, Cordella MATSU, MD.  [2]  Social History Tobacco Use   Smoking status: Former  Current packs/day: 0.00    Average packs/day: 1 pack/day for 40.0 years (40.0 ttl pk-yrs)    Types: Cigarettes    Start date: 40    Quit date: 2000    Years since quitting: 26.0   Smokeless tobacco: Never  Vaping Use   Vaping status: Never Used  Substance Use Topics   Alcohol use: Never   Drug use: Never  [3]  Allergies Allergen Reactions   Ace Inhibitors Cough   Enalapril Cough   "

## 2024-08-25 ENCOUNTER — Other Ambulatory Visit (INDEPENDENT_AMBULATORY_CARE_PROVIDER_SITE_OTHER): Payer: Self-pay | Admitting: Vascular Surgery

## 2024-08-25 DIAGNOSIS — I6523 Occlusion and stenosis of bilateral carotid arteries: Secondary | ICD-10-CM

## 2024-08-27 ENCOUNTER — Encounter (INDEPENDENT_AMBULATORY_CARE_PROVIDER_SITE_OTHER): Payer: Self-pay | Admitting: Vascular Surgery

## 2024-08-27 ENCOUNTER — Ambulatory Visit (INDEPENDENT_AMBULATORY_CARE_PROVIDER_SITE_OTHER): Admitting: Vascular Surgery

## 2024-08-27 ENCOUNTER — Other Ambulatory Visit (INDEPENDENT_AMBULATORY_CARE_PROVIDER_SITE_OTHER)

## 2024-08-27 VITALS — BP 113/73 | HR 76 | Resp 16 | Ht 67.0 in | Wt 163.0 lb

## 2024-08-27 DIAGNOSIS — I6523 Occlusion and stenosis of bilateral carotid arteries: Secondary | ICD-10-CM | POA: Diagnosis not present

## 2024-08-27 DIAGNOSIS — I6509 Occlusion and stenosis of unspecified vertebral artery: Secondary | ICD-10-CM | POA: Insufficient documentation

## 2024-08-27 DIAGNOSIS — I6502 Occlusion and stenosis of left vertebral artery: Secondary | ICD-10-CM | POA: Diagnosis not present

## 2024-08-27 DIAGNOSIS — I10 Essential (primary) hypertension: Secondary | ICD-10-CM

## 2024-08-27 DIAGNOSIS — I779 Disorder of arteries and arterioles, unspecified: Secondary | ICD-10-CM | POA: Diagnosis not present

## 2024-08-27 DIAGNOSIS — J439 Emphysema, unspecified: Secondary | ICD-10-CM | POA: Diagnosis not present

## 2024-08-27 DIAGNOSIS — I771 Stricture of artery: Secondary | ICD-10-CM | POA: Diagnosis not present

## 2024-08-27 DIAGNOSIS — I2510 Atherosclerotic heart disease of native coronary artery without angina pectoris: Secondary | ICD-10-CM | POA: Diagnosis not present

## 2024-09-07 ENCOUNTER — Ambulatory Visit

## 2024-09-17 ENCOUNTER — Ambulatory Visit

## 2024-10-08 ENCOUNTER — Ambulatory Visit (INDEPENDENT_AMBULATORY_CARE_PROVIDER_SITE_OTHER): Admitting: Vascular Surgery

## 2024-10-15 ENCOUNTER — Ambulatory Visit: Admitting: Pulmonary Disease

## 2024-11-05 ENCOUNTER — Other Ambulatory Visit

## 2024-11-09 ENCOUNTER — Ambulatory Visit: Admitting: Internal Medicine

## 2024-11-13 ENCOUNTER — Ambulatory Visit: Admitting: Urology

## 2025-07-28 ENCOUNTER — Ambulatory Visit
